# Patient Record
Sex: Female | Born: 1955 | Race: White | Hispanic: No | Smoking: Never smoker
Health system: Southern US, Community
[De-identification: ages and names within clinical notes are randomized; demographics above are authoritative.]

## PROBLEM LIST (undated history)

## (undated) DIAGNOSIS — M1990 Unspecified osteoarthritis, unspecified site: Secondary | ICD-10-CM

## (undated) DIAGNOSIS — M4800 Spinal stenosis, site unspecified: Secondary | ICD-10-CM

## (undated) DIAGNOSIS — I10 Essential (primary) hypertension: Secondary | ICD-10-CM

## (undated) DIAGNOSIS — R253 Fasciculation: Secondary | ICD-10-CM

## (undated) DIAGNOSIS — H9192 Unspecified hearing loss, left ear: Secondary | ICD-10-CM

## (undated) DIAGNOSIS — R252 Cramp and spasm: Secondary | ICD-10-CM

## (undated) DIAGNOSIS — E785 Hyperlipidemia, unspecified: Secondary | ICD-10-CM

## (undated) DIAGNOSIS — E042 Nontoxic multinodular goiter: Secondary | ICD-10-CM

## (undated) DIAGNOSIS — Z8679 Personal history of other diseases of the circulatory system: Secondary | ICD-10-CM

## (undated) DIAGNOSIS — I639 Cerebral infarction, unspecified: Secondary | ICD-10-CM

## (undated) DIAGNOSIS — K219 Gastro-esophageal reflux disease without esophagitis: Secondary | ICD-10-CM

## (undated) DIAGNOSIS — M791 Myalgia, unspecified site: Secondary | ICD-10-CM

## (undated) DIAGNOSIS — K859 Acute pancreatitis without necrosis or infection, unspecified: Secondary | ICD-10-CM

## (undated) HISTORY — DX: Gastro-esophageal reflux disease without esophagitis: K21.9

## (undated) HISTORY — DX: Hyperlipidemia, unspecified: E78.5

## (undated) HISTORY — DX: Acute pancreatitis without necrosis or infection, unspecified: K85.90

## (undated) HISTORY — DX: Unspecified hearing loss, left ear: H91.92

## (undated) HISTORY — DX: Cerebral infarction, unspecified: I63.9

## (undated) HISTORY — DX: Cramp and spasm: R25.2

## (undated) HISTORY — PX: EXTERNAL EAR SURGERY: SHX627

## (undated) HISTORY — DX: Fasciculation: R25.3

## (undated) HISTORY — DX: Unspecified osteoarthritis, unspecified site: M19.90

## (undated) HISTORY — DX: Spinal stenosis, site unspecified: M48.00

## (undated) HISTORY — DX: Nontoxic multinodular goiter: E04.2

## (undated) HISTORY — PX: TUBAL LIGATION: SHX77

## (undated) HISTORY — DX: Essential (primary) hypertension: I10

## (undated) HISTORY — PX: FRACTURE SURGERY: SHX138

## (undated) HISTORY — DX: Personal history of other diseases of the circulatory system: Z86.79

## (undated) HISTORY — DX: Myalgia, unspecified site: M79.10

---

## 1978-07-20 HISTORY — PX: THYROIDECTOMY, PARTIAL: SHX18

## 1993-07-20 HISTORY — PX: ABDOMINAL HYSTERECTOMY: SHX81

## 1995-07-21 HISTORY — PX: TONSILLECTOMY: SUR1361

## 1998-07-14 ENCOUNTER — Emergency Department (HOSPITAL_COMMUNITY): Admission: EM | Admit: 1998-07-14 | Discharge: 1998-07-14 | Payer: Self-pay | Admitting: Emergency Medicine

## 1998-10-29 ENCOUNTER — Encounter: Payer: Self-pay | Admitting: Endocrinology

## 1998-10-29 ENCOUNTER — Ambulatory Visit (HOSPITAL_COMMUNITY): Admission: RE | Admit: 1998-10-29 | Discharge: 1998-10-29 | Payer: Self-pay | Admitting: Endocrinology

## 1999-04-23 ENCOUNTER — Other Ambulatory Visit: Admission: RE | Admit: 1999-04-23 | Discharge: 1999-04-23 | Payer: Self-pay | Admitting: Endocrinology

## 2001-03-11 ENCOUNTER — Encounter: Admission: RE | Admit: 2001-03-11 | Discharge: 2001-03-11 | Payer: Self-pay | Admitting: Orthopedic Surgery

## 2001-03-11 ENCOUNTER — Encounter: Payer: Self-pay | Admitting: Orthopedic Surgery

## 2001-09-20 ENCOUNTER — Encounter: Payer: Self-pay | Admitting: Internal Medicine

## 2001-09-20 ENCOUNTER — Ambulatory Visit (HOSPITAL_COMMUNITY): Admission: RE | Admit: 2001-09-20 | Discharge: 2001-09-20 | Payer: Self-pay | Admitting: Internal Medicine

## 2001-11-20 ENCOUNTER — Observation Stay (HOSPITAL_COMMUNITY): Admission: EM | Admit: 2001-11-20 | Discharge: 2001-11-21 | Payer: Self-pay | Admitting: *Deleted

## 2001-11-20 ENCOUNTER — Encounter: Payer: Self-pay | Admitting: *Deleted

## 2001-12-05 ENCOUNTER — Emergency Department (HOSPITAL_COMMUNITY): Admission: EM | Admit: 2001-12-05 | Discharge: 2001-12-05 | Payer: Self-pay | Admitting: Emergency Medicine

## 2002-03-03 ENCOUNTER — Other Ambulatory Visit: Admission: RE | Admit: 2002-03-03 | Discharge: 2002-03-03 | Payer: Self-pay | Admitting: Gynecology

## 2002-03-22 ENCOUNTER — Encounter: Payer: Self-pay | Admitting: Gynecology

## 2002-03-29 ENCOUNTER — Observation Stay (HOSPITAL_COMMUNITY): Admission: RE | Admit: 2002-03-29 | Discharge: 2002-03-30 | Payer: Self-pay | Admitting: Gynecology

## 2002-03-29 ENCOUNTER — Encounter (INDEPENDENT_AMBULATORY_CARE_PROVIDER_SITE_OTHER): Payer: Self-pay | Admitting: *Deleted

## 2002-10-05 ENCOUNTER — Encounter: Payer: Self-pay | Admitting: Emergency Medicine

## 2002-10-05 ENCOUNTER — Emergency Department (HOSPITAL_COMMUNITY): Admission: EM | Admit: 2002-10-05 | Discharge: 2002-10-05 | Payer: Self-pay | Admitting: Emergency Medicine

## 2002-10-07 ENCOUNTER — Ambulatory Visit (HOSPITAL_COMMUNITY): Admission: EM | Admit: 2002-10-07 | Discharge: 2002-10-08 | Payer: Self-pay | Admitting: Specialist

## 2003-04-10 ENCOUNTER — Ambulatory Visit (HOSPITAL_BASED_OUTPATIENT_CLINIC_OR_DEPARTMENT_OTHER): Admission: RE | Admit: 2003-04-10 | Discharge: 2003-04-10 | Payer: Self-pay | Admitting: Orthopedic Surgery

## 2003-07-21 HISTORY — PX: ANKLE SURGERY: SHX546

## 2004-01-11 ENCOUNTER — Encounter: Admission: RE | Admit: 2004-01-11 | Discharge: 2004-01-11 | Payer: Self-pay | Admitting: Orthopedic Surgery

## 2004-06-18 ENCOUNTER — Other Ambulatory Visit: Admission: RE | Admit: 2004-06-18 | Discharge: 2004-06-18 | Payer: Self-pay | Admitting: Gynecology

## 2004-09-11 ENCOUNTER — Ambulatory Visit: Payer: Self-pay | Admitting: Internal Medicine

## 2004-12-03 ENCOUNTER — Ambulatory Visit: Payer: Self-pay | Admitting: Endocrinology

## 2004-12-29 ENCOUNTER — Ambulatory Visit: Payer: Self-pay | Admitting: Endocrinology

## 2004-12-31 ENCOUNTER — Ambulatory Visit: Payer: Self-pay | Admitting: Endocrinology

## 2005-01-13 ENCOUNTER — Encounter: Admission: RE | Admit: 2005-01-13 | Discharge: 2005-01-13 | Payer: Self-pay | Admitting: Orthopedic Surgery

## 2005-01-16 ENCOUNTER — Ambulatory Visit: Payer: Self-pay | Admitting: Endocrinology

## 2005-09-18 ENCOUNTER — Ambulatory Visit: Payer: Self-pay | Admitting: Endocrinology

## 2005-09-22 ENCOUNTER — Other Ambulatory Visit: Admission: RE | Admit: 2005-09-22 | Discharge: 2005-09-22 | Payer: Self-pay | Admitting: Gynecology

## 2005-09-23 ENCOUNTER — Encounter: Admission: RE | Admit: 2005-09-23 | Discharge: 2005-09-23 | Payer: Self-pay | Admitting: Gynecology

## 2005-11-13 ENCOUNTER — Ambulatory Visit: Payer: Self-pay | Admitting: Endocrinology

## 2005-11-25 ENCOUNTER — Ambulatory Visit: Payer: Self-pay | Admitting: Endocrinology

## 2006-01-27 ENCOUNTER — Ambulatory Visit: Payer: Self-pay | Admitting: Endocrinology

## 2006-03-15 ENCOUNTER — Ambulatory Visit: Payer: Self-pay | Admitting: Endocrinology

## 2006-04-09 ENCOUNTER — Ambulatory Visit: Payer: Self-pay | Admitting: Endocrinology

## 2006-04-16 ENCOUNTER — Ambulatory Visit: Payer: Self-pay | Admitting: Endocrinology

## 2006-07-16 ENCOUNTER — Ambulatory Visit: Payer: Self-pay | Admitting: Endocrinology

## 2006-09-06 ENCOUNTER — Ambulatory Visit: Payer: Self-pay | Admitting: Internal Medicine

## 2006-09-20 ENCOUNTER — Ambulatory Visit: Payer: Self-pay | Admitting: Endocrinology

## 2006-10-21 ENCOUNTER — Other Ambulatory Visit: Admission: RE | Admit: 2006-10-21 | Discharge: 2006-10-21 | Payer: Self-pay | Admitting: Gynecology

## 2007-05-17 ENCOUNTER — Encounter: Payer: Self-pay | Admitting: Endocrinology

## 2007-05-17 DIAGNOSIS — E785 Hyperlipidemia, unspecified: Secondary | ICD-10-CM

## 2007-05-17 DIAGNOSIS — I10 Essential (primary) hypertension: Secondary | ICD-10-CM | POA: Insufficient documentation

## 2007-05-17 DIAGNOSIS — M1990 Unspecified osteoarthritis, unspecified site: Secondary | ICD-10-CM

## 2007-05-17 DIAGNOSIS — Z8679 Personal history of other diseases of the circulatory system: Secondary | ICD-10-CM | POA: Insufficient documentation

## 2007-05-17 DIAGNOSIS — M4800 Spinal stenosis, site unspecified: Secondary | ICD-10-CM | POA: Insufficient documentation

## 2007-05-17 HISTORY — DX: Essential (primary) hypertension: I10

## 2007-05-17 HISTORY — DX: Hyperlipidemia, unspecified: E78.5

## 2007-05-17 HISTORY — DX: Unspecified osteoarthritis, unspecified site: M19.90

## 2007-05-17 HISTORY — DX: Personal history of other diseases of the circulatory system: Z86.79

## 2007-05-17 HISTORY — DX: Spinal stenosis, site unspecified: M48.00

## 2007-05-18 ENCOUNTER — Encounter: Payer: Self-pay | Admitting: Endocrinology

## 2007-07-25 ENCOUNTER — Ambulatory Visit: Payer: Self-pay | Admitting: Endocrinology

## 2007-07-25 DIAGNOSIS — R7989 Other specified abnormal findings of blood chemistry: Secondary | ICD-10-CM | POA: Insufficient documentation

## 2007-07-25 LAB — CONVERTED CEMR LAB
AST: 15 units/L (ref 0–37)
Albumin: 4 g/dL (ref 3.5–5.2)
Alkaline Phosphatase: 48 units/L (ref 39–117)
Basophils Absolute: 0 10*3/uL (ref 0.0–0.1)
Bilirubin Urine: NEGATIVE
Bilirubin, Direct: 0.2 mg/dL (ref 0.0–0.3)
CO2: 31 meq/L (ref 19–32)
Calcium: 9.7 mg/dL (ref 8.4–10.5)
Chloride: 106 meq/L (ref 96–112)
Creatinine, Ser: 0.8 mg/dL (ref 0.4–1.2)
Direct LDL: 177.9 mg/dL
Eosinophils Absolute: 0.1 10*3/uL (ref 0.0–0.6)
GFR calc Af Amer: 96 mL/min
Glucose, Bld: 99 mg/dL (ref 70–99)
Hemoglobin, Urine: NEGATIVE
Ketones, ur: NEGATIVE mg/dL
Lymphocytes Relative: 18.3 % (ref 12.0–46.0)
MCHC: 35.1 g/dL (ref 30.0–36.0)
MCV: 86.4 fL (ref 78.0–100.0)
Monocytes Absolute: 0.4 10*3/uL (ref 0.2–0.7)
Nitrite: NEGATIVE
Potassium: 4.8 meq/L (ref 3.5–5.1)
Total Bilirubin: 0.6 mg/dL (ref 0.3–1.2)
Total Protein, Urine: NEGATIVE mg/dL
Triglycerides: 139 mg/dL (ref 0–149)
Urine Glucose: NEGATIVE mg/dL

## 2007-07-28 ENCOUNTER — Ambulatory Visit: Payer: Self-pay | Admitting: Endocrinology

## 2007-08-11 ENCOUNTER — Ambulatory Visit: Payer: Self-pay | Admitting: Internal Medicine

## 2007-08-11 DIAGNOSIS — H6690 Otitis media, unspecified, unspecified ear: Secondary | ICD-10-CM | POA: Insufficient documentation

## 2007-08-15 ENCOUNTER — Telehealth (INDEPENDENT_AMBULATORY_CARE_PROVIDER_SITE_OTHER): Payer: Self-pay | Admitting: *Deleted

## 2007-08-15 DIAGNOSIS — H9209 Otalgia, unspecified ear: Secondary | ICD-10-CM | POA: Insufficient documentation

## 2007-08-16 ENCOUNTER — Telehealth: Payer: Self-pay | Admitting: Internal Medicine

## 2007-08-16 DIAGNOSIS — R519 Headache, unspecified: Secondary | ICD-10-CM | POA: Insufficient documentation

## 2007-08-16 DIAGNOSIS — R51 Headache: Secondary | ICD-10-CM | POA: Insufficient documentation

## 2007-08-18 ENCOUNTER — Encounter (INDEPENDENT_AMBULATORY_CARE_PROVIDER_SITE_OTHER): Payer: Self-pay | Admitting: *Deleted

## 2007-09-05 ENCOUNTER — Encounter: Admission: RE | Admit: 2007-09-05 | Discharge: 2007-09-05 | Payer: Self-pay | Admitting: Neurology

## 2007-10-12 ENCOUNTER — Encounter: Payer: Self-pay | Admitting: Internal Medicine

## 2008-02-01 ENCOUNTER — Encounter: Admission: RE | Admit: 2008-02-01 | Discharge: 2008-02-01 | Payer: Self-pay | Admitting: Gynecology

## 2008-02-09 ENCOUNTER — Encounter: Admission: RE | Admit: 2008-02-09 | Discharge: 2008-02-09 | Payer: Self-pay | Admitting: Gynecology

## 2008-02-24 ENCOUNTER — Ambulatory Visit: Payer: Self-pay | Admitting: Endocrinology

## 2008-05-22 ENCOUNTER — Ambulatory Visit: Payer: Self-pay | Admitting: Obstetrics and Gynecology

## 2008-05-22 DIAGNOSIS — M5382 Other specified dorsopathies, cervical region: Secondary | ICD-10-CM | POA: Insufficient documentation

## 2008-05-28 ENCOUNTER — Ambulatory Visit: Payer: Self-pay | Admitting: Cardiovascular Disease

## 2008-05-28 DIAGNOSIS — E042 Nontoxic multinodular goiter: Secondary | ICD-10-CM | POA: Insufficient documentation

## 2008-05-28 HISTORY — DX: Nontoxic multinodular goiter: E04.2

## 2008-06-08 ENCOUNTER — Encounter: Admission: RE | Admit: 2008-06-08 | Discharge: 2008-06-08 | Payer: Self-pay | Admitting: Endocrinology

## 2008-06-18 ENCOUNTER — Telehealth: Payer: Self-pay | Admitting: Endocrinology

## 2008-06-18 DIAGNOSIS — R1319 Other dysphagia: Secondary | ICD-10-CM | POA: Insufficient documentation

## 2008-06-27 ENCOUNTER — Encounter: Admission: RE | Admit: 2008-06-27 | Discharge: 2008-06-27 | Payer: Self-pay | Admitting: Endocrinology

## 2008-06-27 ENCOUNTER — Other Ambulatory Visit: Admission: RE | Admit: 2008-06-27 | Discharge: 2008-06-27 | Payer: Self-pay | Admitting: Diagnostic Radiology

## 2008-06-27 ENCOUNTER — Encounter (INDEPENDENT_AMBULATORY_CARE_PROVIDER_SITE_OTHER): Payer: Self-pay | Admitting: Diagnostic Radiology

## 2008-06-27 ENCOUNTER — Encounter: Payer: Self-pay | Admitting: Endocrinology

## 2008-07-23 ENCOUNTER — Encounter: Payer: Self-pay | Admitting: Internal Medicine

## 2008-07-27 ENCOUNTER — Telehealth (INDEPENDENT_AMBULATORY_CARE_PROVIDER_SITE_OTHER): Payer: Self-pay | Admitting: *Deleted

## 2008-07-30 ENCOUNTER — Ambulatory Visit: Payer: Self-pay | Admitting: Endocrinology

## 2008-08-09 ENCOUNTER — Telehealth: Payer: Self-pay | Admitting: Gastroenterology

## 2008-08-09 ENCOUNTER — Ambulatory Visit: Payer: Self-pay | Admitting: Gastroenterology

## 2008-08-09 DIAGNOSIS — K219 Gastro-esophageal reflux disease without esophagitis: Secondary | ICD-10-CM | POA: Insufficient documentation

## 2008-08-09 HISTORY — DX: Gastro-esophageal reflux disease without esophagitis: K21.9

## 2008-08-20 ENCOUNTER — Encounter: Payer: Self-pay | Admitting: Gastroenterology

## 2008-08-20 ENCOUNTER — Ambulatory Visit: Payer: Self-pay | Admitting: Gastroenterology

## 2008-08-21 ENCOUNTER — Encounter: Payer: Self-pay | Admitting: Gastroenterology

## 2008-08-22 ENCOUNTER — Telehealth: Payer: Self-pay | Admitting: Gastroenterology

## 2008-08-31 ENCOUNTER — Telehealth: Payer: Self-pay | Admitting: Gastroenterology

## 2008-09-13 ENCOUNTER — Telehealth: Payer: Self-pay | Admitting: Gastroenterology

## 2008-09-13 ENCOUNTER — Encounter: Payer: Self-pay | Admitting: Gastroenterology

## 2008-09-14 ENCOUNTER — Telehealth: Payer: Self-pay | Admitting: Gastroenterology

## 2009-03-18 ENCOUNTER — Encounter: Admission: RE | Admit: 2009-03-18 | Discharge: 2009-03-18 | Payer: Self-pay | Admitting: Orthopaedic Surgery

## 2009-03-21 ENCOUNTER — Encounter: Admission: RE | Admit: 2009-03-21 | Discharge: 2009-03-21 | Payer: Self-pay | Admitting: Orthopaedic Surgery

## 2009-06-05 ENCOUNTER — Encounter: Admission: RE | Admit: 2009-06-05 | Discharge: 2009-06-05 | Payer: Self-pay | Admitting: Gynecology

## 2009-06-26 ENCOUNTER — Ambulatory Visit: Payer: Self-pay | Admitting: Endocrinology

## 2009-06-26 LAB — CONVERTED CEMR LAB
ALT: 13 units/L (ref 0–35)
Albumin: 4 g/dL (ref 3.5–5.2)
Alkaline Phosphatase: 47 units/L (ref 39–117)
BUN: 15 mg/dL (ref 6–23)
Basophils Absolute: 0 10*3/uL (ref 0.0–0.1)
Basophils Relative: 0.6 % (ref 0.0–3.0)
Bilirubin Urine: NEGATIVE
CO2: 28 meq/L (ref 19–32)
Creatinine, Ser: 0.9 mg/dL (ref 0.4–1.2)
GFR calc non Af Amer: 68.88 mL/min (ref >60)
HCT: 42.8 % (ref 36.0–46.0)
HDL: 64.2 mg/dL (ref >39.00)
Hemoglobin, Urine: NEGATIVE
Ketones, ur: NEGATIVE mg/dL
Leukocytes, UA: NEGATIVE
Lymphocytes Relative: 29.8 % (ref 12.0–46.0)
Lymphs Abs: 1.5 10*3/uL (ref 0.7–4.0)
MCV: 89 fL (ref 78.0–100.0)
Monocytes Absolute: 0.4 10*3/uL (ref 0.1–1.0)
Neutro Abs: 2.9 10*3/uL (ref 1.4–7.7)
Neutrophils Relative %: 59.5 % (ref 43.0–77.0)
Potassium: 3.8 meq/L (ref 3.5–5.1)
RBC: 4.81 M/uL (ref 3.87–5.11)
Total Bilirubin: 0.7 mg/dL (ref 0.3–1.2)
Total Protein, Urine: NEGATIVE mg/dL
Triglycerides: 99 mg/dL (ref 0.0–149.0)
Urine Glucose: NEGATIVE mg/dL
Urobilinogen, UA: 0.2 (ref 0.0–1.0)
pH: 7.5 (ref 5.0–8.0)

## 2009-07-10 ENCOUNTER — Ambulatory Visit: Payer: Self-pay | Admitting: Endocrinology

## 2010-04-05 IMAGING — US US SOFT TISSUE HEAD/NECK
1 series · 14 of 14 positions shown · non-contrast
Comparison: CT of head dated 05/28/2008

CLINICAL DATA: Multinodular thyroid goiter.  Previous right
thyroidectomy.  Nodule detected in the region of the thyroid by
recent CT of the neck.

THYROID ULTRASOUND
TECHNIQUE: Ultrasound examination of the thyroid gland and
adjacent soft tissues was performed.

[Series 1: us soft tissue head/neck · 0.07mm/px · 14 of 14 slices shown]
[im 1/14]
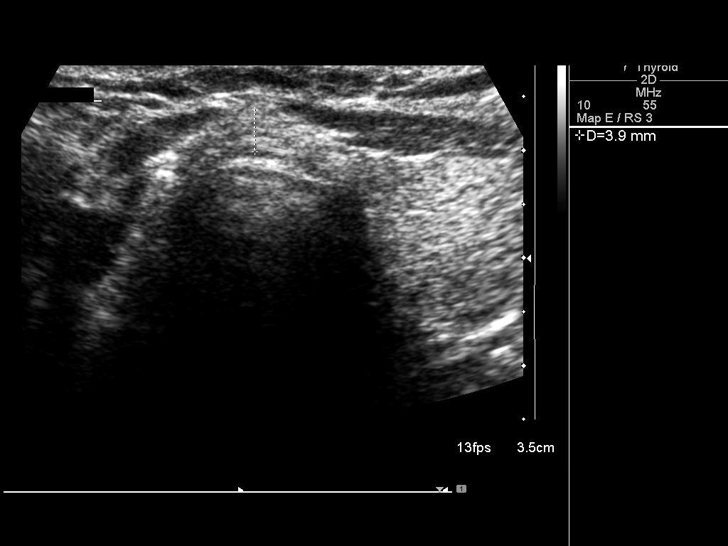
[im 2/14]
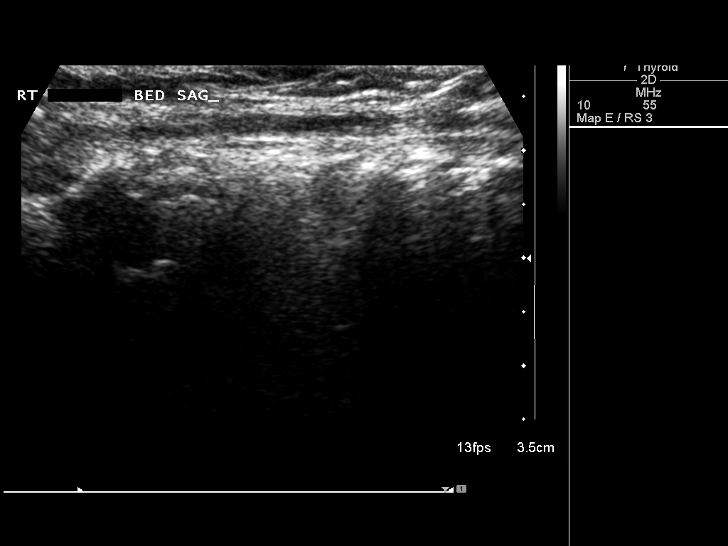
[im 3/14]
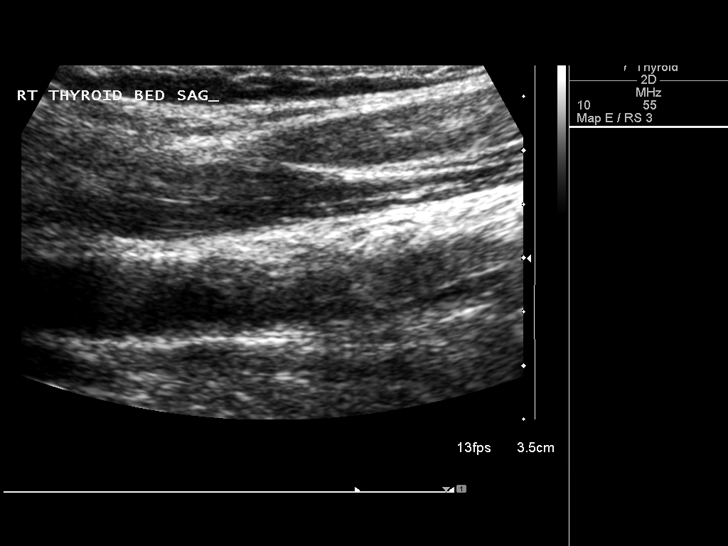
[im 4/14]
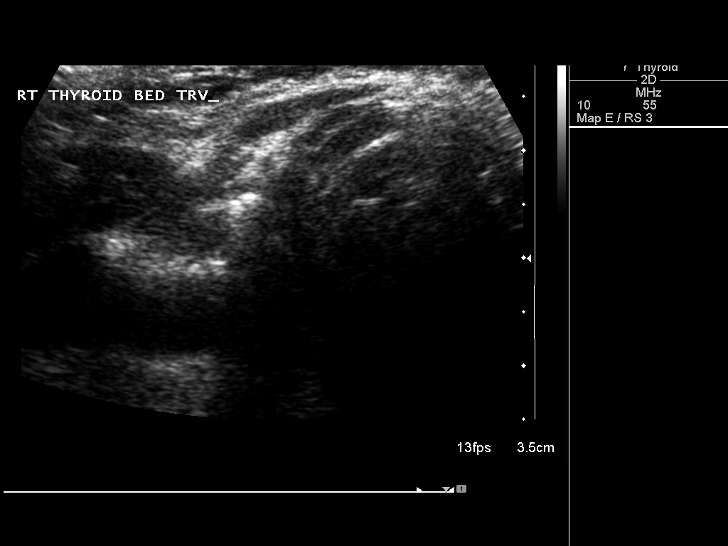
[im 5/14]
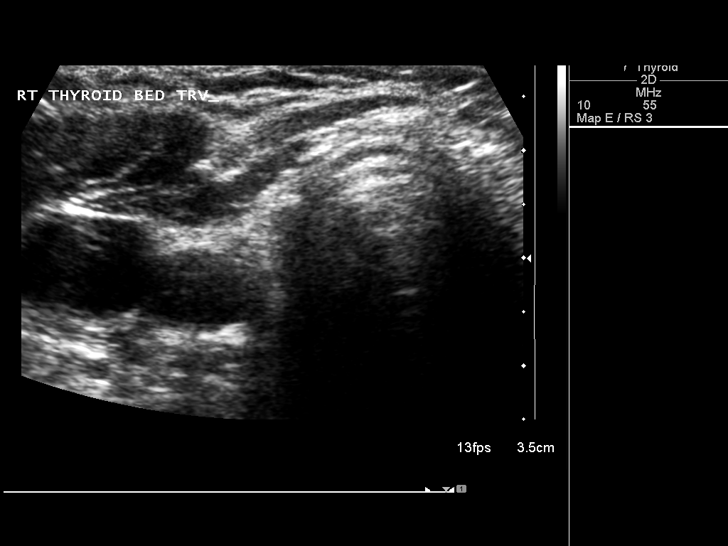
[im 6/14]
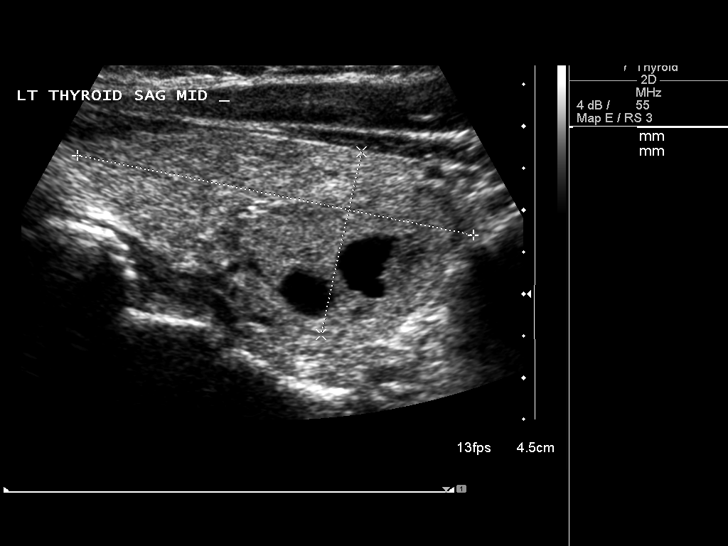
[im 7/14]
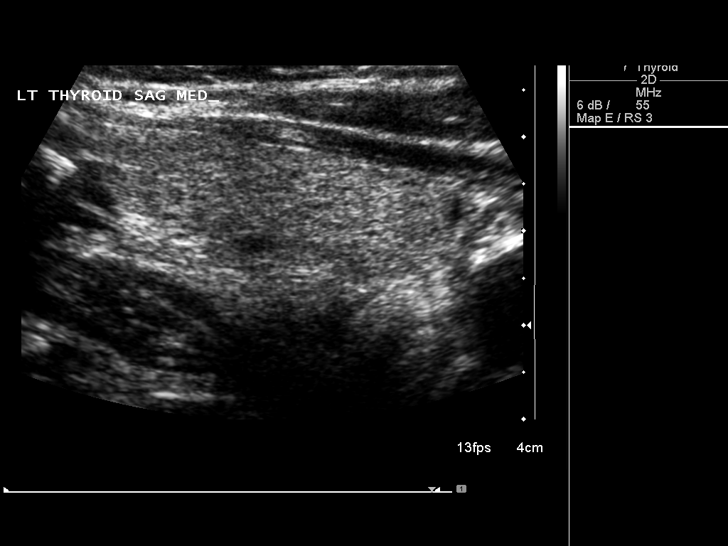
[im 8/14]
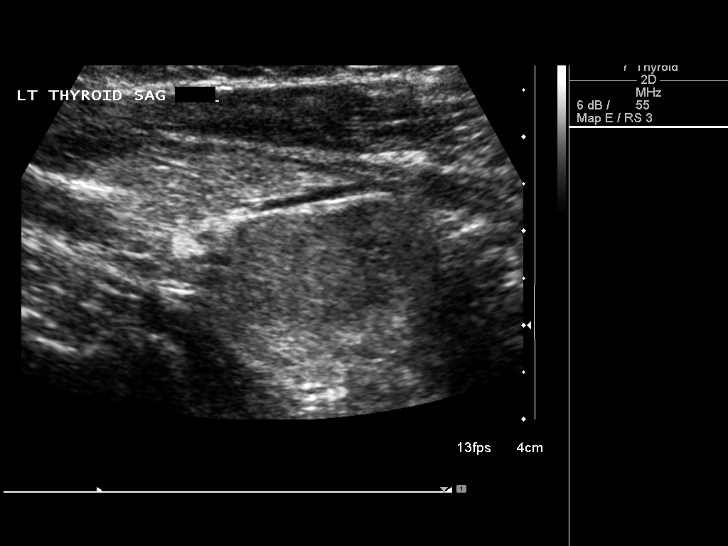
[im 9/14]
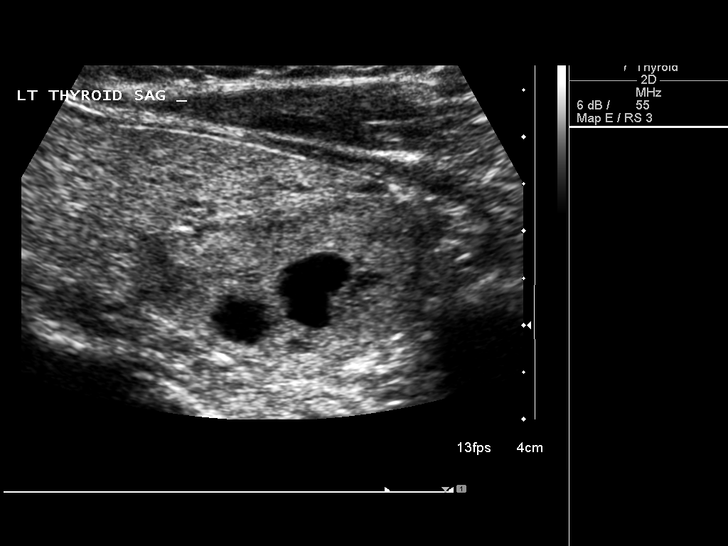
[im 10/14]
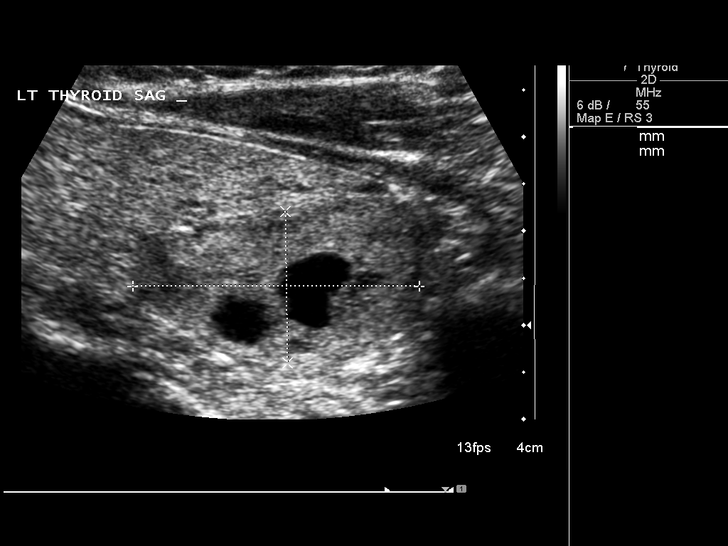
[im 11/14]
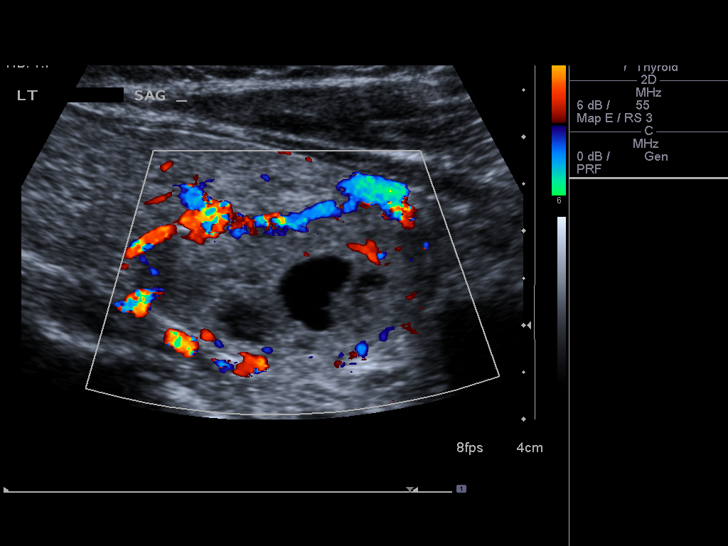
[im 12/14]
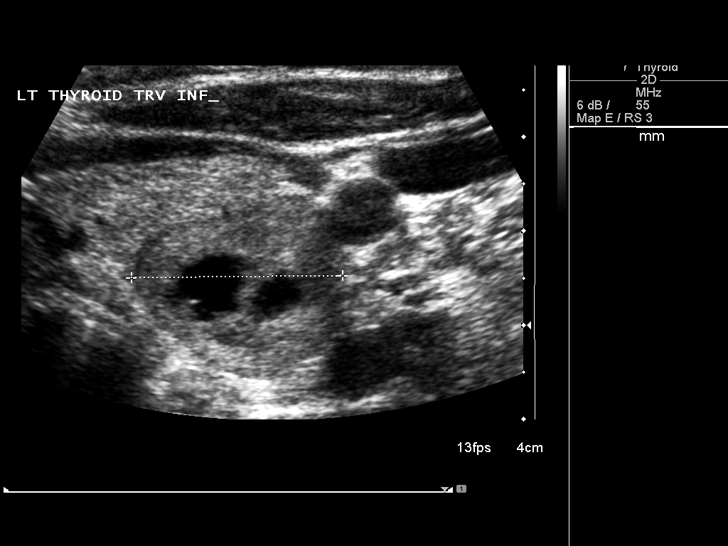
[im 13/14]
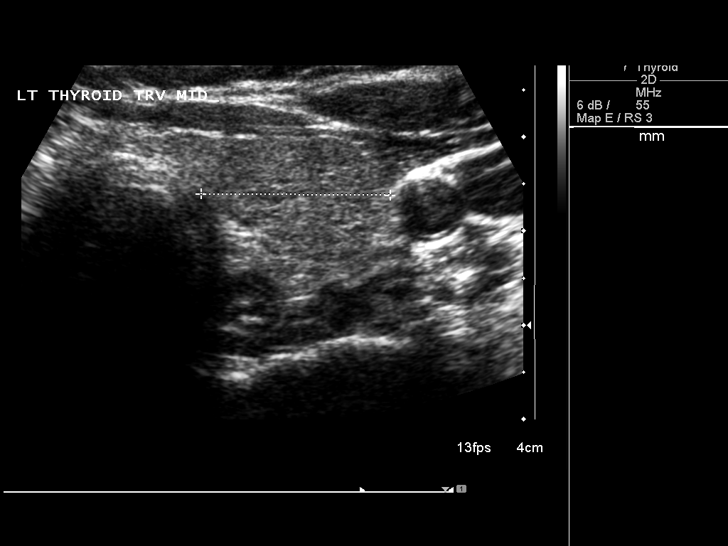
[im 14/14]
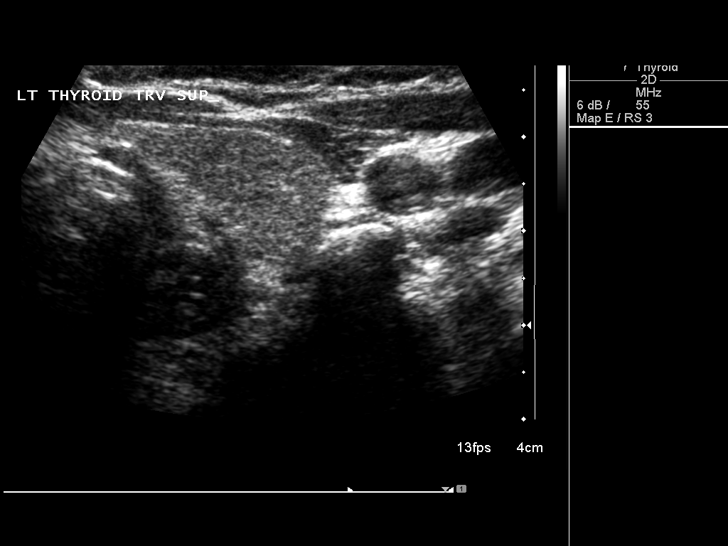

[14 of 14 positions shown; findings below may reference images not displayed]

FINDINGS: Right lobe:  Surgically removed

Left lobe:  4.8 x 2.2 x 2.0 cm.

Thyroid isthmus:  0.4 cm.

Solid and cystic nodule is present in the inferior left lobe
measuring 3.0 x 1.6 x 2.2 cm.  This has peripheral solid tissue
with discrete areas of central cystic degeneration.  No associated
microcalcifications.
IMPRESSION: Thyroid nodule of the inferior left lobe measuring 3 cm in greatest
diameter.  This has both solid and central cystic components.  This
is likely a degenerating nodule.  Options for further workup
included immediate percutaneous biopsy based on size or interval
follow-up ultrasound to determine whether there is any nodule
involution given the central cystic change evident by ultrasound
today.

## 2010-08-04 ENCOUNTER — Telehealth: Payer: Self-pay | Admitting: Endocrinology

## 2010-08-11 ENCOUNTER — Ambulatory Visit
Admission: RE | Admit: 2010-08-11 | Discharge: 2010-08-11 | Payer: Self-pay | Source: Home / Self Care | Attending: Endocrinology | Admitting: Endocrinology

## 2010-08-11 ENCOUNTER — Encounter: Payer: Self-pay | Admitting: Gynecology

## 2010-08-11 ENCOUNTER — Other Ambulatory Visit: Payer: Self-pay | Admitting: Endocrinology

## 2010-08-11 LAB — URINALYSIS
Bilirubin Urine: NEGATIVE
Hemoglobin, Urine: NEGATIVE
Leukocytes, UA: NEGATIVE
Total Protein, Urine: NEGATIVE
Urine Glucose: NEGATIVE
Urobilinogen, UA: 1 (ref 0.0–1.0)

## 2010-08-11 LAB — CBC WITH DIFFERENTIAL/PLATELET
Basophils Absolute: 0 10*3/uL (ref 0.0–0.1)
Basophils Relative: 0.5 % (ref 0.0–3.0)
Lymphocytes Relative: 28.1 % (ref 12.0–46.0)
Monocytes Absolute: 0.4 10*3/uL (ref 0.1–1.0)
Monocytes Relative: 8.1 % (ref 3.0–12.0)
Neutro Abs: 3.3 10*3/uL (ref 1.4–7.7)
Neutrophils Relative %: 60.2 % (ref 43.0–77.0)
Platelets: 275 10*3/uL (ref 150.0–400.0)
RDW: 13.8 % (ref 11.5–14.6)

## 2010-08-11 LAB — BASIC METABOLIC PANEL
BUN: 19 mg/dL (ref 6–23)
CO2: 28 mEq/L (ref 19–32)
Calcium: 9.3 mg/dL (ref 8.4–10.5)
GFR: 83.38 mL/min (ref >60.00)
Glucose, Bld: 91 mg/dL (ref 70–99)

## 2010-08-11 LAB — HEPATIC FUNCTION PANEL
ALT: 15 U/L (ref 0–35)
AST: 16 U/L (ref 0–37)
Alkaline Phosphatase: 52 U/L (ref 39–117)

## 2010-08-11 LAB — LDL CHOLESTEROL, DIRECT: Direct LDL: 186.4 mg/dL

## 2010-08-13 ENCOUNTER — Encounter: Payer: Self-pay | Admitting: Endocrinology

## 2010-08-13 ENCOUNTER — Ambulatory Visit
Admission: RE | Admit: 2010-08-13 | Discharge: 2010-08-13 | Payer: Self-pay | Source: Home / Self Care | Attending: Endocrinology | Admitting: Endocrinology

## 2010-08-17 LAB — CONVERTED CEMR LAB

## 2010-08-21 NOTE — Progress Notes (Signed)
Summary: Rx refill req  Phone Note Refill Request Message from:  Patient on August 04, 2010 4:26 PM  Refills Requested: Medication #1:  MAXZIDE-25 37.5-25 MG  TABS 1 qd   Dosage confirmed as above?Dosage Confirmed   Supply Requested: 1 month Pt will schedule appt within next 30 days   Method Requested: Electronic Initial call taken by: Margaret Pyle, CMA,  August 04, 2010 4:27 PM    Prescriptions: MAXZIDE-25 37.5-25 MG  TABS (TRIAMTERENE-HCTZ) 1 qd  #30 x 0   Entered by:   Margaret Pyle, CMA   Authorized by:   Minus Breeding MD   Signed by:   Margaret Pyle, CMA on 08/04/2010   Method used:   Electronically to        [facility] [address] [phone]* (retail)       [address]       [place], [place]  [address]       Ph: [phone] or [phone]       Fax: [phone]   RxID:   [id number]

## 2010-08-27 NOTE — Assessment & Plan Note (Signed)
Summary: yearly f/u / bcbs/cd   Vital Signs:  Patient profile:   55 year old female Height:      66 inches (167.64 cm) Weight:      237.50 pounds (107.95 kg) BMI:     38.47 O2 Sat:      97 % on Room air Temp:     99.6 degrees F (37.56 degrees C) oral Pulse rate:   81 / minute Pulse rhythm:   regular BP sitting:   128 / 80  (left arm) Cuff size:   large  Vitals Entered By: Brenton Grills CMA Duncan Dull) (August 13, 2010 10:26 AM)  O2 Flow:  Room air CC: [facility]/aj Is Patient Diabetic? No Comments pt is due for mammogram and pap with Dr. Nicholas Lose   Primary Provider:  Lorna Few  CC:  [facility]/aj.  History of Present Illness: here for regular wellness examination.  He's feeling pretty well in general, except for dry mouth   Current Medications (verified): 1)  Norvasc 10 Mg  Tabs (Amlodipine Besylate) .... Once Daily 2)  Ecotrin Low Strength 81 Mg  Tbec (Aspirin) .Marland Kitchen.. 1 By Mouth Qd 3)  Vivelle-Dot 0.05 Mg/24hr  Pttw (Estradiol) .... As Dir--Dr Nicholas Lose 4)  Maxzide-25 37.5-25 Mg  Tabs (Triamterene-Hctz) .Marland Kitchen.. 1 Tablet By Mouth Once Daily 5)  Omeprazole 40 Mg Cpdr (Omeprazole) .... Take 1 Capsule By Mouth Once Daily  Allergies (verified): 1)  ! Morphine 2)  ! * Vytroin 3)  ! * Crestor  Past History:  Past Medical History: Last updated: 07/17/2008 Hyperlipidemia Hypertension Osteoarthritis Transient ischemic attack, hx of mlld left hearing loss spinal stenosis prominent right clavicular head (pt says w/u showed this to be scar tissue due to 1980 thyroid surgery) Obesity  Family History: Reviewed history from 08/09/2008 and no changes required. Uncle had rectal cancer Family History High cholesterol Family History Hypertension mother abd father with CAD mother and 2 bro with DM her mother has Barrett's mucosa her father suffered from peptic ulcers disease.  Social History: Reviewed history from 08/09/2008 and no changes required. realtor married Never Smoked Alcohol  use-no Daily Caffeine Use  diet soda  Review of Systems  The patient denies fever, weight loss, weight gain, vision loss, decreased hearing, chest pain, syncope, dyspnea on exertion, prolonged cough, headaches, abdominal pain, melena, hematochezia, severe indigestion/heartburn, hematuria, suspicious skin lesions, and depression.    Physical Exam  General:  normal appearance.   Head:  head: no deformity eyes: no periorbital swelling, no proptosis external nose and ears are normal mouth: no lesion seen Neck:  a healed scar is present.  i do not appreciate a nodule in the thyroid or elsewhere in the neck  Breasts:  sees gyn  Lungs:  Clear to auscultation bilaterally. Normal respiratory effort.  Heart:  Regular rate and rhythm without murmurs or gallops noted. Normal S1,S2.   Abdomen:  abdomen is soft, nontender.  no hepatosplenomegaly.   not distended.  no hernia  Rectal:  sees gyn  Genitalia:  sees gyn Msk:  muscle bulk and strength are grossly normal.  no obvious joint swelling.  gait is normal and steady  Pulses:  dorsalis pedis intact bilat.  no carotid bruit. Extremities:  no deformity.  no ulcer on the feet.  feet are of normal color and temp.  no edema there is a healed surgical scar on the left foot, near the arch. Neurologic:  sensation is intact to touch on the feet. Skin:  normal texture and temp.  no rash.  not diaphoretic  Cervical Nodes:  No significant adenopathy.  Psych:  Alert and cooperative; normal mood and affect; normal attention span and concentration.     Impression & Recommendations:  Problem # 1:  ROUTINE GENERAL MEDICAL EXAM@HEALTH  CARE FACL (ICD-V70.0)  Problem # 2:  dry mouth ? due to diuretic  Medications Added to Medication List This Visit: 1)  Maxzide-25 37.5-25 Mg Tabs (Triamterene-hctz) .Marland Kitchen.. 1 tablet by mouth once daily 2)  Maxzide-25 37.5-25 Mg Tabs (Triamterene-hctz) .... 1/2 tablet by mouth once daily 3)  Omeprazole 40 Mg Cpdr  (Omeprazole) .... Take 1 capsule by mouth once daily  Other Orders: EKG w/ Interpretation (93000) Est. Patient 40-64 years ([address])  Patient Instructions: 1)  reduce triamterene-hctz to 1/2 pill per day. 2)  please consider these measures for your health:  minimize alcohol.  do not use tobacco products.  have a colonoscopy at least every 10 years from age 42.  keep firearms safely stored.  always use seat belts.  have working smoke alarms in your home.  see an eye doctor and dentist regularly.  never drive under the influence of alcohol or drugs (including prescription drugs).  those with fair skin should take precautions against the sun. 3)  please let me know what your wishes would be, if artificial life support measures should become necessary.  it is critically important to prevent falling down (keep floor areas well-lit, dry, and free of loose objects). 4)  (update: we discussed code status.  pt requests full code, but would not want to be started or maintained on artificial life-support measures if there was not a reasonable chance of recovery) Prescriptions: NORVASC 10 MG  TABS (AMLODIPINE BESYLATE) once daily  #90 x 3   Entered and Authorized by:   Minus Breeding MD   Signed by:   Minus Breeding MD on 08/13/2010   Method used:   Electronically to        [facility] [address] [phone]* (retail)       [address]       [place], [place]  [address]       Ph: [phone] or [phone]       Fax: [phone]   RxID:   [id number] MAXZIDE-25 37.5-25 MG  TABS (TRIAMTERENE-HCTZ) 1/2 tablet by mouth once daily  #30 x 5   Entered and Authorized by:   Minus Breeding MD   Signed by:   Minus Breeding MD on 08/13/2010   Method used:   Electronically to        [facility] [address] [phone]* (retail)       [address]       [place], [place]  [address]       Ph: [phone] or [phone]       Fax: [phone]   RxID:    [id number]    Orders Added: 1)  EKG w/ Interpretation [93[address]] 2)  Est. Patient 40-64 years [phone]

## 2010-10-03 ENCOUNTER — Telehealth: Payer: Self-pay | Admitting: Endocrinology

## 2010-10-07 NOTE — Progress Notes (Signed)
Summary: BP med dosage?  Phone Note Call from Patient   Caller: Patient [phone] Summary of Call: Pt called stating her BP medication was reduced at [facility] in January to 1/2 tab. Pt has since noticed and increase in fluid retention and BP so she has recently began taking 1 tab instead. Pt states her BP is now within normal ranges and she has less fluid retention. Pt is requesting new Rx for 1 tab once daily, please advise. Initial call taken by: Margaret Pyle, CMA,  October 03, 2010 8:27 AM  Follow-up for Phone Call        sent Follow-up by: Minus Breeding MD,  October 03, 2010 9:18 AM  Additional Follow-up for Phone Call Additional follow up Details #1::        Pt informed via VM Additional Follow-up by: Margaret Pyle, CMA,  October 03, 2010 9:28 AM    New/Updated Medications: MAXZIDE-25 37.5-25 MG  TABS (TRIAMTERENE-HCTZ) 1 tablet by mouth once daily Prescriptions: MAXZIDE-25 37.5-25 MG  TABS (TRIAMTERENE-HCTZ) 1 tablet by mouth once daily  #30 x 11   Entered and Authorized by:   Minus Breeding MD   Signed by:   Minus Breeding MD on 10/03/2010   Method used:   Electronically to        [facility] [address] [phone]* (retail)       [address]       [place], [place]  [address]       Ph: [phone] or [phone]       Fax: [phone]   RxID:   [id number]

## 2010-10-13 ENCOUNTER — Other Ambulatory Visit: Payer: Self-pay | Admitting: Gynecology

## 2010-10-13 DIAGNOSIS — Z1231 Encounter for screening mammogram for malignant neoplasm of breast: Secondary | ICD-10-CM

## 2010-10-16 ENCOUNTER — Ambulatory Visit: Payer: Self-pay

## 2010-10-22 ENCOUNTER — Encounter: Payer: Self-pay | Admitting: Internal Medicine

## 2010-10-22 ENCOUNTER — Ambulatory Visit (INDEPENDENT_AMBULATORY_CARE_PROVIDER_SITE_OTHER): Payer: BC Managed Care – PPO | Admitting: Internal Medicine

## 2010-10-22 VITALS — BP 132/76 | HR 80 | Temp 98.2°F | Ht 66.0 in

## 2010-10-22 DIAGNOSIS — H6690 Otitis media, unspecified, unspecified ear: Secondary | ICD-10-CM

## 2010-10-22 MED ORDER — LORATADINE-PSEUDOEPHEDRINE ER 5-120 MG PO TB12: 1.0000 | ORAL_TABLET | Freq: Two times a day (BID) | ORAL | Status: AC

## 2010-10-22 MED ORDER — FLUTICASONE PROPIONATE 50 MCG/ACT NA SUSP: 2.0000 | Freq: Every day | NASAL | Status: DC

## 2010-10-22 MED ORDER — AMOXICILLIN-POT CLAVULANATE 875-125 MG PO TABS: 1.0000 | ORAL_TABLET | Freq: Two times a day (BID) | ORAL | Status: AC

## 2010-10-22 NOTE — Progress Notes (Signed)
  Subjective:    Patient ID: Kelly Wilson, female    DOB: 11-09-1955, 55 y.o.   MRN: [id number]  HPI the patient complains of left ear "plugged" sensation Onset 10days ago eval at gyn last week - tx Zpak and drops - unimproved associated with decreased hearing, head congestion and "popping" in L ear but no fever, no ear pain or headache No drainage from ear +hx same requiring surg >63yr ago Past Medical History  Diagnosis Date  . Hyperlipidemia   . Hypertension   . 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 HYPERLIPIDEMIA 05/17/2007  . HYPERTENSION 05/17/2007  . GERD 08/09/2008  . OSTEOARTHRITIS 05/17/2007  . SPINAL STENOSIS 05/17/2007  . TRANSIENT ISCHEMIC ATTACK, HX OF 05/17/2007  . Left ear hearing loss     Mild    Review of Systems  Constitutional: Negative for fever and fatigue.  HENT: Negative for neck pain, neck stiffness and tinnitus.   Respiratory: Negative for cough and shortness of breath.   Cardiovascular: Negative for chest pain.      Objective:   Physical Exam  Constitutional: She is oriented to person, place, and time. She appears well-developed and well-nourished. No distress.  HENT:  Head: Normocephalic and atraumatic.  Right Ear: External ear normal.  Nose: Nose normal.  Mouth/Throat: Oropharynx is clear and moist.       L TM red with hazy effusion  Eyes: Conjunctivae and EOM are normal. Pupils are equal, round, and reactive to light. No scleral icterus.  Neck: Normal range of motion. Neck supple.  Cardiovascular: Normal rate, regular rhythm and normal heart sounds.  Exam reveals no friction rub.   No murmur heard. Pulmonary/Chest: Effort normal and breath sounds normal. No respiratory distress. She has no wheezes.  Lymphadenopathy:    She has no cervical adenopathy.  Neurological: She is alert and oriented to person, place, and time. No cranial nerve deficit. Coordination normal.   BP 132/76  Pulse 80  Temp(Src) 98.2 F (36.8 C) (Oral)  Ht 5\' 6"   (1.676 m)      Assessment & Plan:  See problem list. Medications and labs reviewed today.

## 2010-10-22 NOTE — Patient Instructions (Addendum)
It was good to see you today. Augmentin, Flonase and decongestant as discussed - Your prescription(s) have been submitted to your pharmacy. Please take as directed and contact our office if you believe you are having problem(s) with the medication(s). Alternate between ibuprofen and tylenol for aches or pain symptoms: Dose yourself every 4-6 hours using 400-600 mg ibuprofen alternating with 1000 mg acetaminophen as needed Call if symptoms unimproved with treatment or if symptoms worse

## 2010-10-22 NOTE — Assessment & Plan Note (Signed)
status post Zpak without improvement in symptoms  Hx complicated L ear infections >80yr ago requiring surg Treat now with 10d augmentin, flonase and decongestants - erx done To call if unimproved to consider ENT refer - pt understands and agrees

## 2010-12-05 NOTE — Op Note (Signed)
TNAMEKwynn, Kelly Wilson NO.:  [id number]   MEDICAL RECORD NO.:  [id number]                   PATIENT TYPE:   LOCATION:                                       FACILITY:   PHYSICIAN:  Gretta Cool, M.D.              DATE OF BIRTH:   DATE OF PROCEDURE:  03/29/2002  DATE OF DISCHARGE:                                 OPERATIVE REPORT   PREOPERATIVE DIAGNOSIS:  Severe and incapacitating refractory pelvic pain.   POSTOPERATIVE DIAGNOSIS:  1. Probable adenomyosis with a large boggy soft uterus, no evidence of     external endometriosis.  2. 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   SURGEON:  Gretta Cool, M.D.   ASSISTANT:  Raynald Kemp, M.D.   ANESTHESIA:  General.   PROCEDURE:  Diagnostic laparoscopy, laparoscopically assisted vaginal  hysterectomy, right salpingo-oophorectomy.   DESCRIPTION OF PROCEDURE:  Under excellent general anesthesia as above, oral  trachea with the patient prepped and draped in the lithotomy position in  Herman stirrups, a Hulka tenaculum was applied to the cervix and bladder  drained.  A Hasson incision was then made and Veress cameras introduced  after adequate pneumoperitoneum with carbon dioxide 2 L.  A laparoscope  trocar 11 mm was introduced.  The pelvic floor was then carefully re-  scrutinized with a straight laparoscope.  There was no evidence of  significant endometriosis.  Her right ovary was on a tenuous pedicle after  tubal sterilization procedure by Fallo band, possibly associated with  intermittent torsion.  The uterus was boggy and large, soft, highly  suspicious of adenomyosis, filled her pelvis from side wall to side wall,  making manipulation somewhat difficult.  At this point, decision was made to  proceed with laparoscopy assisted hysterectomy and right salpingo-  oophorectomy.  The accessory probes were placed, and the tripolar forceps  were used to cauterize the round ligament  and infundibulopelvic structures  on the right.  On the left, the ovarian ligament was cauterized and then  transected.  The cardinal ligament was also cauterized and transected.  The  dissection was taken to the level of the uterine vessels.  At this point,  the pedicles were dried.  The upper portion of the procedure was terminated,  and the patient repositioned for the vaginal portion of the procedure.  The  cervix was grasped with a single tooth tenaculum, pulled down to view.  It  was then progressively infiltrated with Xylocaine with epinephrine  1:200,000.  Mucosa was then incised and the mucosa pushed off the lower  uterine segment.  The cul-de-sac was then entered, and the cardinal and  uterosacral ligaments progressively clamped, cut, sutured, and tied with 0  Vicryl.  The vesicovaginal plica was then opened and a Deaver placed beneath  the bladder.  The uterine vessels were then clamped, cut, sutured, and tied  with 0 Vicryl.  At this point, a  communicating clamp using a mattressing  clamp was used to clamp the intervening tissue between the above and below  dissections.  The uterus was then removed, and the pedicles ligated.  A  cardinal uterosacral colposuspension was then performed using 0 Ethibond and  securing the ligaments to the vaginal cuff.  The peritoneum was then closed  by pursestring suture of 0 Monocryl.  The cuff was then approximated with a  running suture of 0 Vicryl from left-to-right.  At the end of the procedure,  the vaginal incision was dry and well-approximated by subcuticular closure.  Attention was then turned to the abdominal portion of the procedure.  The  abdomen was then reinflated with carbon dioxide and the pedicles examined.  They had reduced pressure.  There was no significant bleeding.  Pelvic floor  was irrigated  with lactated ringers, and the incisions were closed with a deep suture of 5-  0 Vicryl, skin closure with Steri-Strips.  At the end  of the procedure,  sponge and lap counts were correct.  There were no complications.  The  patient returned to the recovery room in excellent condition.                                                Gretta Cool, M.D.    CWL/MEDQ  D:  03/29/2002  T:  03/29/2002  Job:  [id number]   cc:   Gregary Signs A. Everardo All, M.D. [facility]

## 2010-12-05 NOTE — Discharge Summary (Signed)
[place]  Patient:    Kelly Wilson, Kelly Wilson Visit Number: [id number] MRN: [id number]          Service Type: MED Location: 3000 [id number] Attending Physician:  Fenton Malling Dictated by:   Candy Sledge, M.D. Admit Date:  11/20/2001 Discharge Date: 11/21/2001   CC:         Justine Null, M.D. [facility]   Discharge Summary  For complete details and chief complaint, history of present illness, past medical history and physical examination please refer to Dr. Kelli Hope admission history and physical.  Briefly, the patient is a 55 year old female with a past history including hypertension; who, on the morning of admission, noted a tingling sensation involving her left arm, face and foot.  This progressed over several minutes to numbness and there was associated weakness.  Symptoms had pretty much cleared upon her arrival at the emergency room.  She then had a recurrence of the same symptomatology and was noted to have definite left-sided weakness on examination by the emergency room physician. Upon her return from the CT scan; however, she again was asymptomatic.  Her only complaint at the time of Dr. Thad Ranger was the left temporal headache. The patients only medication on admission was Accupril 40 mg p.o. every day. Her exam was nonfocal per Dr. Thad Ranger.  She was admitted for observation with the diagnosis of right brain transient ischemic attacks, probably small vessel in nature.  She was started on intravenous heparin.  The patient also has a history of a stapedectomy in the past with placement of metal in her ear, prohibiting MRI scanning.  LABORATORY FINDINGS:  Admission CBC and differential showed white blood cell count 6600, hemoglobin 13.2 and hematocrit 38.4.  Differential showed 76% neutrophils, 16% lymphocytes, and 6% monocytes.  Comprehensive metabolic panel was entirely normal.  Prothrombin time was 12.4 with an INR  of 0.9.  CT scan of the head, on admission, per report was negative.  A 12-lead EKG showed normal sinus rhythm, cannot rule out anterior infarct -- age undetermined.  A 2-D echocardiogram was a limited study technically, but showed an ejection fraction of 55-65%.  No regional wall motion abnormalities.  No obvious source of emboli.  Carotid Doppler studies showed no obvious evidence of plaque in the internal carotid arteries.  The vertebral artery flow was antegrade bilaterally.  COURSE IN HOSPITAL: The patient was admitted to the neurosciences unit.  Vital signs remained stable throughout this brief hospitalization with maximum blood pressure 152/74.  She is afebrile.  The patient did complain of a left-sided headache on the day following admission which eased off temporarily with 2 Vicodin and was otherwise tolerable.  She had no recurrence of left-sided or other focal symptomatology.  The patient was maintained on heparin while undergoing the above noted studies.  After reviewing her results, it was felt that she had derived the maximum hospital benefit on the evening of 11/21/01 and was discharged to home.  DISCHARGE DIAGNOSES: 1. 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 2. History of hypertension. 3. History of migraine headaches.  DISCHARGE MEDICATIONS: 1. Enteric coated aspirin 325 mg p.o. every day. 2. Accupril 40 mg p.o. every day.  CONDITION ON DISCHARGE:  Stable.  PROGNOSIS:  Good.  DISCHARGE INSTRUCTIONS:  The patient was instructed to followup with Dr. Romero Belling as scheduled or as needed, and also to return to see Dr. Kelli Hope or Dr. Fransisca Connors, as needed, for  neuological followup. Dictated by:   Candy Sledge, M.D. Attending Physician:  Fenton Malling DD:  11/21/01 TD:  11/24/01 Job: [id number]

## 2010-12-05 NOTE — H&P (Signed)
[facility]  Patient:    Kelly Wilson, Kelly Wilson Visit Number: [id number] MRN: [id number]          Service Type: MED Location: 3000 [id number] Attending Physician:  Fenton Malling Dictated by:   Kelli Hope, M.D. Admit Date:  11/20/2001   CC:         Justine Null, M.D. [facility]   History and Physical  DATE OF BIRTH: [birth year]  REASON FOR ADMISSION: Left-sided numbness.  HISTORY OF PRESENT ILLNESS: This is a [place] stroke service admission for this 55 year old woman, with past medical history including hypertension, who was sitting in church this morning when she suddenly noticed a tingling sensation come over her left arm, face, and foot. This progressed over several minutes to a numbness sensation.  She may have had some weakness with this, although she is not certain.  EMS was alerted and brought her to the emergency room.  Her symptoms were much better upon arrival to the emergency room but then recurred.  At that point she was examined by the emergency room physician and was felt to have definite weakness on the left side.  She was taken to the CT scanner and upon return from CT was essentially asymptomatic.  She now feels well except that she has a throbbing headache in the left temporal area.  She denies any chest pain, shortness of breath, or palpitations associated with the event.  She was noted to be quite anxious when the event recurred in the emergency room.  PAST MEDICAL HISTORY:  1. Hypertension, as above, which she says is under good control.  2. History of migraine.  3. Has had stapedectomy in the past.  FAMILY HISTORY: Remarkable for coronary artery disease in both her parents. She also says that some members of her mothers family had some early strokes.  SOCIAL HISTORY: She denies tobacco or alcohol use.  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  ALLERGIES: MORPHINE.  MEDICATIONS: Accupril 40 mg q.d.  REVIEW OF SYSTEMS: Headache as above.  She denies changes in her speech or vision, any recent illnesses, fevers, chills, chest pain, shortness of breath, cough, palpitations, nausea, vomiting, diarrhea, difficulty with bladder or bowel, skin rash, or joint pain.  PHYSICAL EXAMINATION:  VITAL SIGNS: Temperature 99.2 degrees, blood pressure 148/81, pulse 65, respirations 18.  GENERAL: She is alert and in no distress.  Speech is fluent and not dysarthric.  Mood is euthymic and affect appropriate.  HEENT: Cranium normocephalic, atraumatic.  Oropharynx benign.  NECK: Supple without carotid bruits.  CHEST: Clear to auscultation.  HEART: Regular rate and rhythm without murmurs.  ABDOMEN: Nontender, nondistended.  Normoactive bowel sounds.  EXTREMITIES: Trace pretibial edema, normal pulses.  NEUROLOGIC: Cranial nerves, funduscopic examination benign.  Pupils equal and briskly reactive.  Extraocular movements were normal without nystagmus. Visual fields full to confrontation.  Face, tongue and palate move normally and symmetrically.  Motor, normal bulk and tone, normal strength in all tested extremity muscles.  Sensation intact to light touch and pinprick in all extremities.  Rapid alternating movements performed well.  Finger-to-nose intact.  Reflexes 2+ and symmetric.  Toes downgoing.  Gait normal.  She is able to toe walk a little bit.  LABORATORY DATA: CBC normal, i-STAT 8 normal.  EKG reveals normal sinus rhythm with no clear injury current.  CT of the head is personally reviewed and demonstrates  no acute abnormality.  IMPRESSION: Right brain transient ischemic attack, probably small vessel.  PLAN:  1. Admit for observation.  2. Intravenous heparin for now.  3. Repeat CT in the morning.  MRI is contraindicated due to her stapedectomy.  4. Carotid Doppler.  5. Echocardiogram.  6. If work-up  is negative will DC on aspirin.  7. Stroke physician to evaluate in a.m. Dictated by:   Kelli Hope, M.D. Attending Physician:  Fenton Malling DD:  11/20/01 TD:  11/21/01 Job: [id number]

## 2010-12-05 NOTE — H&P (Signed)
Kelly Wilson, Kelly Wilson                      ACCOUNT NO.:  [id number]   MEDICAL RECORD NO.:  [id number]                   PATIENT TYPE:  OBV   LOCATION:  0460                                 FACILITY:  [facility]   PHYSICIAN:  Gretta Cool, M.D.              DATE OF BIRTH:  1956-01-30   DATE OF ADMISSION:  03/29/2002  DATE OF DISCHARGE:                                HISTORY & PHYSICAL   CHIEF COMPLAINT:  Incapacitating and refractory cyclic pelvic pain.   HISTORY OF PRESENT ILLNESS:  The patient is a 55 year old gravida 2, para 2  with history of tubal sterilization.  She has had increasingly severe and  incapacitating predominantly right lower quadrant pain that is cyclic, most  severe mid cycle, also severe prior to onset of menses.  She has had work-up  by another gynecology office with CT scan and ultrasound that showed an  ovarian cyst, apparently functional in size and character.  She has a very  strong family history of endometriosis and highly suggestive symptomatology.  Her cycles remain approximately 24 days in length, but last seven to eight  days with heavy flow.  She has recently been placed on progesterone only  suppression of her cycles and has continued that for approximately six  weeks, but has not had significant benefit.  Her pain is increased by full  bladder and before bowel movement.  She describes pain like things are  falling out.  The intensity is such that she is unable to concentrate on her  work and daily activities.  After a period she is largely free of discomfort  for a week or two, only to have recurrent symptoms once again.   She is under the primary care by Dr. Romero Belling, treated for migraines,  hypertension.   PAST MEDICAL HISTORY:  Usual childhood disease without sequela.  Medical  illnesses:  Migraines, hypertension, and obesity.   PAST SURGICAL HISTORY:  Ear surgery 2002. Dr. Lenard Forth.  In 1993 cesarean  section, Dr. Amedeo Kinsman.   Thyroid surgery 1980.   ALLERGIES:  MORPHINE causes itching, hives, and nausea/vomiting.   FAMILY HISTORY:  Father and mother are both living at advanced age.  Two  brothers have helped.  Mother has type 2 diabetes and endometriosis.  No  other known tendency to disease.   SOCIAL HISTORY:  Married.  Bookkeeper.  Husband works for a Recruitment consultant.  One child living at home.  One is grown and  on his own.  Habits:  Denies ethanol or tobacco use.  Denies recreational  drugs.   REVIEW OF SYMPTOMS:  HEENT:  Denies symptoms.  CARDIORESPIRATORY:  Denies  asthma, cough, bronchitis, shortness of breath.  GI/GU:  No frequency,  urgency, dysuria, change in bowel habits, food intolerance.   PHYSICAL EXAMINATION:  GENERAL:  Well-developed, but rather massively obese  white female with high abdomen to hip ratio.  HEENT:  Pupils  are equal, round, and reactive to light and accommodation.  Fundi not examined.  Oropharynx clear.  NECK:  Supple without mass, thyromegaly.  CHEST:  Clear PTA.  HEART:  Regular rhythm without murmur or cardiac enlargement.  BREASTS:  Soft without mass, nodes, nipple discharge.  ABDOMEN:  Enormous panniculus with pendulous abdomen and extreme abdomen to  hip ratio disproportion.  No palpable masses or organomegaly.  PELVIC:  External genitalia:  Normal female.  Vagina clean, rugose.  Cervix  is parous, large, descends approximately halfway to the vagina.  The upper  vagina, however, appears well supported.  The uterus is approximately 10  weeks size.  Adnexa clear.  Rectovaginal confirms.  EXTREMITIES:  Negative.  NEUROLOGIC:  Physiologic.   IMPRESSION:  1. Incapacitating cyclic pelvic pain with history strongly suggestive of     endometriosis refractory to conservative management.  2. Hypertension.  3. Obesity.  4. Strong family history of endometriosis, maternal family history of type 2     diabetes.   RECOMMENDATIONS:  I have recommended  __________ diagnostic laparoscopy,  possible laparoscopy assisted hysterectomy, possible TAH/BSO.  The patient  understands the alternatives and options, risks and benefits.  She is now  admitted for definitive evaluation by laparoscopy, possible hysterectomy as  above.                                               Gretta Cool, M.D.    CWL/MEDQ  D:  03/30/2002  T:  03/30/2002  Job:  [id number]   cc:   Gregary Signs A. Everardo All, M.D. [facility]
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
## 2010-12-05 NOTE — Op Note (Signed)
Kelly Wilson, Kelly Wilson                      ACCOUNT NO.:  [id number]   MEDICAL RECORD NO.:  [id number]                   PATIENT TYPE:  AMB   LOCATION:  DSC                                  FACILITY:  [facility]   PHYSICIAN:  Leonides Grills, M.D.                  DATE OF BIRTH:  1956-01-04   DATE OF PROCEDURE:  04/10/2003  DATE OF DISCHARGE:                                 OPERATIVE REPORT   PREOPERATIVE DIAGNOSIS:  1. Complication of hardware, left ankle.  2. Status post left distal tib-fib syndesmotic rupture with ankle     impingement.  3. Tenosynovitis of ankle.  4. Tibial spurs.  5. Loose body, left ankle.   POSTOPERATIVE DIAGNOSIS:  1. Complication of hardware, left ankle.  2. Status post left distal tib-fib syndesmotic rupture with ankle     impingement.  3. Tenosynovitis of ankle.  4. Tibial spurs.  5. Loose body, left ankle.  6. Osteochondral lesion, left lateral talar dome.   OPERATION PERFORMED:  1. Left ankle arthroscopy with extensive debridement.  2. Left excision of tibial spurs.  3. Left hardware removal, deep ankle.  4. Stress x-rays, left ankle.  5. Debridement and drilling of lateral talar dome osteochondral lesion.  6. Loose body removal, left ankle.   SURGEON:  Leonides Grills, M.D.   ASSISTANT:  Lianne Cure, P.A.   ANESTHESIA:  General endotracheal tube.   ESTIMATED BLOOD LOSS:  Minimal.   TOURNIQUET TIME:  None.   COMPLICATIONS:  None.   DISPOSITION:  Stable to PR.   INDICATIONS FOR PROCEDURE:  The patient is a 55 year old female who  sustained a distal tib-fib syndesmotic rupture and required open reduction  internal fixation six months ago.  She has had persistent ankle pain  anteriorly, anterolaterally with mechanical symptoms.  She was consented for  the above procedure.  All risks which include infection, neurovascular  injury, persistent pain, worsening pain, stiffness, persistent mechanical  symptoms and possibility of nonunion of  the distal tib-fib syndesmotic  ligaments with possible refixation were all explained.  Questions were  encouraged and answered.   DESCRIPTION OF PROCEDURE:  The patient was brought to the operating room and  placed in supine position after adequate general endotracheal tube  anesthesia was administered as well as Ancef 1g IV piggyback.  The left  lower extremity was then prepped and draped in sterile manner over  proximally placed thigh tourniquet which was not used.  We started the  procedure with a longitudinal incision over the syndesmotic screw head.  Dissection was carried down to the head and bone.  Screw was then removed  with difficulty.  The same procedure was done through a separate incision  for the second screw as well.  The wounds were copiously irrigated with  normal saline.  Stress x-rays were then obtained to see if there was any  opening of the distal tib-fib syndesmosis and there was  not.  At this point  we then instilled 20mL normal saline into the ankle through an anteromedial  portal once the anterior landmarks, namely the anterior tibialis as well as  superficial peroneal nerve were marked out.  The anteromedial portal was  then created with a nick and spread technique, a blunt tip cannula was  introduced followed by the camera under direct visualization.  The  anterolateral portal was then created with a spinal needle and again with a  nick and spread technique.  Once this was done with instrumentation and  visualization in the joint, there was a very large anterior distal tibial  osteophyte as well as a lateral talar dome osteochondral lesion as well as  extensive synovitis throughout the ankle.  Once the synovitis was trimmed  back with an [employer] wand as well as a shaver, we then addressed the  lateral talar dome osteochondral lesion with a shaver followed by a  microfracture technique with debridement and drilling of this area.  This  had excellent repair of  the osteochondral lesion.  We then debrided both the  medial and lateral gutters of the ankle and then proceeded to remove the  anterior distal tibial osteophyte with the aid of a curved quarter inch  osteotome and a rongeur followed by a bur.  Once this was completely done  and debrided, there was also a loose body with the ankle that I had not  mentioned previously that was removed as well with a grabber.  Pictures were  obtained throughout the procedure.  Scope was removed.  The wounds were  closed with 4-0 nylon.  Sterile dressing was applied.  Cam walker boot was  applied.  The patient was stable to the PR.  The wound was then copiously irrigated with normal saline.  The skin was  closed with 4-0 nylon.  Sterile dressing was applied.  The patient was  stable to the PR.                                                   Leonides Grills, M.D.    PB/MEDQ  D:  04/10/2003  T:  04/11/2003  Job:  [id number]

## 2011-02-18 HISTORY — PX: ORIF ELBOW FRACTURE: SUR928

## 2011-02-18 HISTORY — PX: KNEE SURGERY: SHX244

## 2011-03-20 ENCOUNTER — Emergency Department (HOSPITAL_COMMUNITY)
Admission: EM | Admit: 2011-03-20 | Discharge: 2011-03-20 | Disposition: A | Discharge status: Home / Self Care | Payer: BC Managed Care – PPO | Attending: Emergency Medicine | Admitting: Emergency Medicine

## 2011-03-20 DIAGNOSIS — I10 Essential (primary) hypertension: Secondary | ICD-10-CM | POA: Insufficient documentation

## 2011-03-20 DIAGNOSIS — R112 Nausea with vomiting, unspecified: Secondary | ICD-10-CM | POA: Insufficient documentation

## 2011-03-20 DIAGNOSIS — R10819 Abdominal tenderness, unspecified site: Secondary | ICD-10-CM | POA: Insufficient documentation

## 2011-03-20 DIAGNOSIS — Z79899 Other long term (current) drug therapy: Secondary | ICD-10-CM | POA: Insufficient documentation

## 2011-03-20 DIAGNOSIS — K859 Acute pancreatitis without necrosis or infection, unspecified: Secondary | ICD-10-CM | POA: Insufficient documentation

## 2011-03-20 DIAGNOSIS — R109 Unspecified abdominal pain: Secondary | ICD-10-CM | POA: Insufficient documentation

## 2011-03-20 LAB — URINALYSIS, ROUTINE W REFLEX MICROSCOPIC
Glucose, UA: NEGATIVE mg/dL
Leukocytes, UA: NEGATIVE
Nitrite: NEGATIVE
Protein, ur: NEGATIVE mg/dL
pH: 5.5 (ref 5.0–8.0)

## 2011-03-20 LAB — COMPREHENSIVE METABOLIC PANEL
ALT: 9 U/L (ref 0–35)
Albumin: 3.6 g/dL (ref 3.5–5.2)
Alkaline Phosphatase: 69 U/L (ref 39–117)
Calcium: 10 mg/dL (ref 8.4–10.5)
GFR calc Af Amer: 57 mL/min — ABNORMAL LOW (ref >60)
Glucose, Bld: 111 mg/dL — ABNORMAL HIGH (ref 70–99)
Potassium: 3.3 mEq/L — ABNORMAL LOW (ref 3.5–5.1)
Sodium: 137 mEq/L (ref 135–145)
Total Protein: 7.1 g/dL (ref 6.0–8.3)

## 2011-03-20 LAB — CBC
Hemoglobin: 12.8 g/dL (ref 12.0–15.0)
MCH: 28.6 pg (ref 26.0–34.0)
MCHC: 35.1 g/dL (ref 30.0–36.0)
Platelets: 384 10*3/uL (ref 150–400)
RDW: 13.4 % (ref 11.5–15.5)

## 2011-03-20 LAB — DIFFERENTIAL
Basophils Absolute: 0 10*3/uL (ref 0.0–0.1)
Eosinophils Relative: 1 % (ref 0–5)
Lymphocytes Relative: 27 % (ref 12–46)
Monocytes Absolute: 0.5 10*3/uL (ref 0.1–1.0)
Monocytes Relative: 8 % (ref 3–12)
Neutro Abs: 3.8 10*3/uL (ref 1.7–7.7)

## 2011-03-20 LAB — URINE MICROSCOPIC-ADD ON

## 2011-06-03 ENCOUNTER — Other Ambulatory Visit: Payer: Self-pay | Admitting: Orthopaedic Surgery

## 2011-06-03 DIAGNOSIS — M549 Dorsalgia, unspecified: Secondary | ICD-10-CM

## 2011-06-03 DIAGNOSIS — M25552 Pain in left hip: Secondary | ICD-10-CM

## 2011-06-09 ENCOUNTER — Telehealth: Payer: Self-pay | Admitting: *Deleted

## 2011-06-09 NOTE — Telephone Encounter (Signed)
Immunization report received from [employer] given: Influenza (Afluria) Date administered: 06/01/2011 Route/Injection Site: Left Arm/Deltoid Dose: 0.5 mL Lot #: [id number] Exp Date: 01/17/2012 VIS: 01/19/2011

## 2011-06-10 NOTE — Patient Instructions (Signed)

## 2011-06-12 ENCOUNTER — Ambulatory Visit
Admission: RE | Admit: 2011-06-12 | Discharge: 2011-06-12 | Disposition: A | Discharge status: Home / Self Care | Payer: BC Managed Care – PPO | Source: Ambulatory Visit | Attending: Orthopaedic Surgery | Admitting: Orthopaedic Surgery

## 2011-06-12 DIAGNOSIS — M25552 Pain in left hip: Secondary | ICD-10-CM

## 2011-06-12 DIAGNOSIS — M549 Dorsalgia, unspecified: Secondary | ICD-10-CM

## 2011-06-12 MED ORDER — DIAZEPAM 5 MG PO TABS
10.0000 mg | ORAL_TABLET | Freq: Once | ORAL | Status: AC
  Administered 2011-06-12: 10 mg via ORAL

## 2011-06-12 MED ORDER — IOHEXOL 180 MG/ML  SOLN
17.0000 mL | Freq: Once | INTRAMUSCULAR | Status: AC | PRN
  Administered 2011-06-12: 17 mL via INTRAVENOUS

## 2011-08-14 ENCOUNTER — Emergency Department (INDEPENDENT_AMBULATORY_CARE_PROVIDER_SITE_OTHER)
Admission: EM | Admit: 2011-08-14 | Discharge: 2011-08-14 | Disposition: A | Discharge status: Home / Self Care | Payer: BC Managed Care – PPO | Source: Home / Self Care | Attending: Emergency Medicine | Admitting: Emergency Medicine

## 2011-08-14 ENCOUNTER — Encounter (HOSPITAL_COMMUNITY): Payer: Self-pay

## 2011-08-14 DIAGNOSIS — S61209A Unspecified open wound of unspecified finger without damage to nail, initial encounter: Secondary | ICD-10-CM

## 2011-08-14 DIAGNOSIS — S61219A Laceration without foreign body of unspecified finger without damage to nail, initial encounter: Secondary | ICD-10-CM

## 2011-08-14 NOTE — ED Notes (Signed)
States she was preparing meal, when she accidentally sliced left index  finger w knife; laceration aprox 2 cm to distal portion of finger, bleeding controlled at present
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
## 2011-10-10 ENCOUNTER — Other Ambulatory Visit: Payer: Self-pay | Admitting: Endocrinology

## 2011-10-23 ENCOUNTER — Ambulatory Visit (INDEPENDENT_AMBULATORY_CARE_PROVIDER_SITE_OTHER): Payer: BC Managed Care – PPO | Admitting: Internal Medicine

## 2011-10-23 ENCOUNTER — Encounter: Payer: Self-pay | Admitting: Internal Medicine

## 2011-10-23 VITALS — BP 122/80 | HR 86 | Temp 98.5°F | Resp 16 | Ht 66.0 in | Wt 235.0 lb

## 2011-10-23 DIAGNOSIS — I10 Essential (primary) hypertension: Secondary | ICD-10-CM

## 2011-10-23 DIAGNOSIS — L049 Acute lymphadenitis, unspecified: Secondary | ICD-10-CM | POA: Insufficient documentation

## 2011-10-23 DIAGNOSIS — E042 Nontoxic multinodular goiter: Secondary | ICD-10-CM

## 2011-10-23 MED ORDER — DOXYCYCLINE MONOHYDRATE 100 MG PO CAPS: 100.0000 mg | ORAL_CAPSULE | Freq: Two times a day (BID) | ORAL | Status: AC

## 2011-10-23 NOTE — Assessment & Plan Note (Signed)
I think she has a bacterial lymph gland infection so I have started her on doxycycline, also I will check her labs to look for lymphoproliferative disease, mono, etc.

## 2011-10-23 NOTE — Assessment & Plan Note (Signed)
Her BP is well controlled, I will check her lytes and renal function today 

## 2011-10-23 NOTE — Patient Instructions (Signed)
Hypertension As your heart beats, it forces blood through your arteries. This force is your blood pressure. If the pressure is too high, it is called hypertension (HTN) or high blood pressure. HTN is dangerous because you may have it and not know it. High blood pressure may mean that your heart has to work harder to pump blood. Your arteries may be narrow or stiff. The extra work puts you at risk for heart disease, stroke, and other problems.  Blood pressure consists of two numbers, a higher number over a lower, 110/72, for example. It is stated as "110 over 72." The ideal is below 120 for the top number (systolic) and under 80 for the bottom (diastolic). Write down your blood pressure today. You should pay close attention to your blood pressure if you have certain conditions such as:  Heart failure.   Prior heart attack.   Diabetes   Chronic kidney disease.   Prior stroke.   Multiple risk factors for heart disease.  To see if you have HTN, your blood pressure should be measured while you are seated with your arm held at the level of the heart. It should be measured at least twice. A one-time elevated blood pressure reading (especially in the Emergency Department) does not mean that you need treatment. There may be conditions in which the blood pressure is different between your right and left arms. It is important to see your caregiver soon for a recheck. Most people have essential hypertension which means that there is not a specific cause. This type of high blood pressure may be lowered by changing lifestyle factors such as:  Stress.   Smoking.   Lack of exercise.   Excessive weight.   Drug/tobacco/alcohol use.   Eating less salt.  Most people do not have symptoms from high blood pressure until it has caused damage to the body. Effective treatment can often prevent, delay or reduce that damage. TREATMENT  When a cause has been identified, treatment for high blood pressure is  directed at the cause. There are a large number of medications to treat HTN. These fall into several categories, and your caregiver will help you select the medicines that are best for you. Medications may have side effects. You should review side effects with your caregiver. If your blood pressure stays high after you have made lifestyle changes or started on medicines,   Your medication(s) may need to be changed.   Other problems may need to be addressed.   Be certain you understand your prescriptions, and know how and when to take your medicine.   Be sure to follow up with your caregiver within the time frame advised (usually within two weeks) to have your blood pressure rechecked and to review your medications.   If you are taking more than one medicine to lower your blood pressure, make sure you know how and at what times they should be taken. Taking two medicines at the same time can result in blood pressure that is too low.  SEEK IMMEDIATE MEDICAL CARE IF:  You develop a severe headache, blurred or changing vision, or confusion.   You have unusual weakness or numbness, or a faint feeling.   You have severe chest or abdominal pain, vomiting, or breathing problems.  MAKE SURE YOU:   Understand these instructions.   Will watch your condition.   Will get help right away if you are not doing well or get worse.  Document Released: 07/06/2005 Document Revised: 06/25/2011 Document Reviewed:   02/24/2008 [employer] Patient Information 2012 [place], [place].Lymphadenopathy Lymphadenopathy means "disease of the lymph glands." But the term is usually used to describe swollen or enlarged lymph glands, also called lymph nodes. These are the bean-shaped organs found in many locations including the neck, underarm, and groin. Lymph glands are part of the immune system, which fights infections in your body. Lymphadenopathy can occur in just one area of the body, such as the neck, or it can be  generalized, with lymph node enlargement in several areas. The nodes found in the neck are the most common sites of lymphadenopathy. CAUSES  When your immune system responds to germs (such as viruses or bacteria ), infection-fighting cells and fluid build up. This causes the glands to grow in size. This is usually not something to worry about. Sometimes, the glands themselves can become infected and inflamed. This is called lymphadenitis. Enlarged lymph nodes can be caused by many diseases:  Bacterial disease, such as strep throat or a skin infection.   Viral disease, such as a common cold.   Other germs, such as lyme disease, tuberculosis, or sexually transmitted diseases.   Cancers, such as lymphoma (cancer of the lymphatic system) or leukemia (cancer of the white blood cells).   Inflammatory diseases such as lupus or rheumatoid arthritis.   Reactions to medications.  Many of the diseases above are rare, but important. This is why you should see your caregiver if you have lymphadenopathy. SYMPTOMS   Swollen, enlarged lumps in the neck, back of the head or other locations.   Tenderness.   Warmth or redness of the skin over the lymph nodes.   Fever.  DIAGNOSIS  Enlarged lymph nodes are often near the source of infection. They can help healthcare providers diagnose your illness. For instance:   Swollen lymph nodes around the jaw might be caused by an infection in the mouth.   Enlarged glands in the neck often signal a throat infection.   Lymph nodes that are swollen in more than one area often indicate an illness caused by a virus.  Your caregiver most likely will know what is causing your lymphadenopathy after listening to your history and examining you. Blood tests, x-rays or other tests may be needed. If the cause of the enlarged lymph node cannot be found, and it does not go away by itself, then a biopsy may be needed. Your caregiver will discuss this with you. TREATMENT    Treatment for your enlarged lymph nodes will depend on the cause. Many times the nodes will shrink to normal size by themselves, with no treatment. Antibiotics or other medicines may be needed for infection. Only take over-the-counter or prescription medicines for pain, discomfort or fever as directed by your caregiver. HOME CARE INSTRUCTIONS  Swollen lymph glands usually return to normal when the underlying medical condition goes away. If they persist, contact your health-care provider. He/she might prescribe antibiotics or other treatments, depending on the diagnosis. Take any medications exactly as prescribed. Keep any follow-up appointments made to check on the condition of your enlarged nodes.  SEEK MEDICAL CARE IF:   Swelling lasts for more than two weeks.   You have symptoms such as weight loss, night sweats, fatigue or fever that does not go away.   The lymph nodes are hard, seem fixed to the skin or are growing rapidly.   Skin over the lymph nodes is red and inflamed. This could mean there is an infection.  SEEK IMMEDIATE MEDICAL CARE IF:  Fluid starts leaking from the area of the enlarged lymph node.   You develop a fever of 102 F (38.9 C) or greater.   Severe pain develops (not necessarily at the site of a large lymph node).   You develop chest pain or shortness of breath.   You develop worsening abdominal pain.  MAKE SURE YOU:   Understand these instructions.   Will watch your condition.   Will get help right away if you are not doing well or get worse.  Document Released: 04/14/2008 Document Revised: 06/25/2011 Document Reviewed: 04/14/2008 [facility] Patient Information 2012 [place], [place].

## 2011-10-23 NOTE — Assessment & Plan Note (Signed)
Exam is normal today, I will check her TSH

## 2011-10-23 NOTE — Progress Notes (Signed)
Subjective:    Patient ID: Kelly Wilson, female    DOB: 1956/06/06, 56 y.o.   MRN: [id number]  HPI New to me she complains of a 3 week history of painful lymph nodes in her neck, the R is more painful than the L. She has also had a mild headache.   Review of Systems  Constitutional: Negative for fever, chills, diaphoresis, activity change, appetite change, fatigue and unexpected weight change.  HENT: Negative for ear pain, sore throat, facial swelling, trouble swallowing, neck pain, neck stiffness, voice change, sinus pressure and ear discharge.   Eyes: Negative.   Respiratory: Negative for apnea, cough, choking, chest tightness, shortness of breath, wheezing and stridor.   Cardiovascular: Negative for chest pain, palpitations and leg swelling.  Gastrointestinal: Negative for nausea, vomiting, abdominal pain, diarrhea, constipation, blood in stool, abdominal distention, anal bleeding and rectal pain.  Genitourinary: Negative.   Musculoskeletal: Negative for myalgias, back pain, joint swelling, arthralgias and gait problem.  Skin: Negative for color change, pallor, rash and wound.  Hematological: Positive for adenopathy. Does not bruise/bleed easily.  Psychiatric/Behavioral: Negative.        Objective:   Physical Exam  Vitals reviewed. Constitutional: She is oriented to person, place, and time. She appears well-developed and well-nourished.  Non-toxic appearance. She does not have a sickly appearance. She does not appear ill. No distress.  HENT:  Head: Normocephalic and atraumatic.  Mouth/Throat: Oropharynx is clear and moist. No oropharyngeal exudate.  Eyes: Conjunctivae are normal. Right eye exhibits no discharge. Left eye exhibits no discharge. No scleral icterus.  Neck: Normal range of motion. Neck supple. No JVD present. No tracheal deviation present. No thyromegaly present.  Cardiovascular: Normal rate, regular rhythm, normal heart sounds and intact distal pulses.  Exam  reveals no gallop and no friction rub.   No murmur heard. Pulmonary/Chest: Effort normal and breath sounds normal. No stridor. No respiratory distress. She has no wheezes. She has no rales. She exhibits no tenderness.  Abdominal: Soft. Bowel sounds are normal. She exhibits no distension and no mass. There is no tenderness. There is no rebound and no guarding.  Musculoskeletal: Normal range of motion. She exhibits no edema and no tenderness.  Lymphadenopathy:       Head (right side): No submental, no submandibular, no tonsillar, no preauricular, no posterior auricular and no occipital adenopathy present.       Head (left side): No submental, no submandibular, no tonsillar, no preauricular, no posterior auricular and no occipital adenopathy present.    She has cervical adenopathy.       Right cervical: Superficial cervical adenopathy present. No deep cervical and no posterior cervical adenopathy present.      Left cervical: Superficial cervical adenopathy present. No deep cervical and no posterior cervical adenopathy present.    She has no axillary adenopathy.       Right axillary: No pectoral and no lateral adenopathy present.       Left axillary: No pectoral and no lateral adenopathy present.      Right: No inguinal, no supraclavicular and no epitrochlear adenopathy present.       Left: No inguinal, no supraclavicular and no epitrochlear adenopathy present.  Neurological: She is oriented to person, place, and time.  Skin: Skin is warm and dry. No rash noted. She is not diaphoretic. No erythema. No pallor.  Psychiatric: She has a normal mood and affect. Her behavior is normal. Judgment and thought content normal.  Assessment & Plan:

## 2012-02-17 ENCOUNTER — Other Ambulatory Visit: Payer: Self-pay | Admitting: Endocrinology

## 2012-02-25 ENCOUNTER — Other Ambulatory Visit: Payer: Self-pay | Admitting: *Deleted

## 2012-02-25 DIAGNOSIS — Z0000 Encounter for general adult medical examination without abnormal findings: Secondary | ICD-10-CM

## 2012-03-15 ENCOUNTER — Encounter: Payer: BC Managed Care – PPO | Admitting: Endocrinology

## 2012-04-04 ENCOUNTER — Encounter: Payer: BC Managed Care – PPO | Admitting: Endocrinology

## 2012-04-15 ENCOUNTER — Other Ambulatory Visit (INDEPENDENT_AMBULATORY_CARE_PROVIDER_SITE_OTHER): Payer: BC Managed Care – PPO

## 2012-04-15 DIAGNOSIS — Z0000 Encounter for general adult medical examination without abnormal findings: Secondary | ICD-10-CM

## 2012-04-15 LAB — URINALYSIS, ROUTINE W REFLEX MICROSCOPIC
Bilirubin Urine: NEGATIVE
Hgb urine dipstick: NEGATIVE
Ketones, ur: NEGATIVE
Leukocytes, UA: NEGATIVE
Nitrite: NEGATIVE
Specific Gravity, Urine: 1.01
Total Protein, Urine: NEGATIVE
Urine Glucose: NEGATIVE
Urobilinogen, UA: 0.2
pH: 7.5 (ref 5.0–8.0)

## 2012-04-15 LAB — BASIC METABOLIC PANEL
BUN: 16 mg/dL (ref 6–23)
Chloride: 108 mEq/L (ref 96–112)
Creatinine, Ser: 0.9 mg/dL (ref 0.4–1.2)
Glucose, Bld: 87 mg/dL (ref 70–99)
Potassium: 4.1 mEq/L (ref 3.5–5.1)

## 2012-04-15 LAB — HEPATIC FUNCTION PANEL
ALT: 17 U/L (ref 0–35)
AST: 20 U/L (ref 0–37)
Albumin: 3.6 g/dL (ref 3.5–5.2)
Alkaline Phosphatase: 50 U/L (ref 39–117)
Bilirubin, Direct: 0.1 mg/dL (ref 0.0–0.3)
Total Bilirubin: 0.6 mg/dL (ref 0.3–1.2)
Total Protein: 6.3 g/dL (ref 6.0–8.3)

## 2012-04-15 LAB — CBC WITH DIFFERENTIAL/PLATELET
Basophils Absolute: 0 K/uL (ref 0.0–0.1)
Basophils Relative: 0.6 % (ref 0.0–3.0)
Eosinophils Absolute: 0.1 K/uL (ref 0.0–0.7)
Eosinophils Relative: 2.7 % (ref 0.0–5.0)
HCT: 40.4 % (ref 36.0–46.0)
Hemoglobin: 13.6 g/dL (ref 12.0–15.0)
Lymphocytes Relative: 38 % (ref 12.0–46.0)
Lymphs Abs: 1.7 K/uL (ref 0.7–4.0)
MCHC: 33.7 g/dL (ref 30.0–36.0)
MCV: 86.1 fl (ref 78.0–100.0)
Monocytes Absolute: 0.3 K/uL (ref 0.1–1.0)
Monocytes Relative: 6.7 % (ref 3.0–12.0)
Neutro Abs: 2.3 K/uL (ref 1.4–7.7)
Neutrophils Relative %: 52 % (ref 43.0–77.0)
Platelets: 250 K/uL (ref 150.0–400.0)
RBC: 4.69 Mil/uL (ref 3.87–5.11)
RDW: 14.2 % (ref 11.5–14.6)
WBC: 4.4 K/uL — ABNORMAL LOW (ref 4.5–10.5)

## 2012-04-15 LAB — TSH: TSH: 4.24 u[IU]/mL (ref 0.35–5.50)

## 2012-04-15 LAB — LIPID PANEL
Cholesterol: 234 mg/dL — ABNORMAL HIGH (ref 0–200)
HDL: 62.5 mg/dL
Total CHOL/HDL Ratio: 4
Triglycerides: 83 mg/dL (ref 0.0–149.0)
VLDL: 16.6 mg/dL (ref 0.0–40.0)

## 2012-04-15 LAB — LDL CHOLESTEROL, DIRECT: Direct LDL: 143.4 mg/dL

## 2012-04-21 ENCOUNTER — Ambulatory Visit (INDEPENDENT_AMBULATORY_CARE_PROVIDER_SITE_OTHER): Payer: BC Managed Care – PPO | Admitting: Endocrinology

## 2012-04-21 VITALS — BP 150/82 | HR 68 | Temp 97.7°F | Resp 16 | Ht 66.75 in | Wt 240.6 lb

## 2012-04-21 DIAGNOSIS — R059 Cough, unspecified: Secondary | ICD-10-CM | POA: Insufficient documentation

## 2012-04-21 DIAGNOSIS — R05 Cough: Secondary | ICD-10-CM

## 2012-04-21 DIAGNOSIS — E785 Hyperlipidemia, unspecified: Secondary | ICD-10-CM

## 2012-04-21 NOTE — Progress Notes (Signed)
Subjective:    Patient ID: Kelly Wilson, female    DOB: [birth year], 56 y.o.   MRN: [id number]  HPI here for regular wellness examination.  He's feeling pretty well in general, and says chronic med probs are stable, except as noted below Past Medical History  Diagnosis Date  . Hyperlipidemia   . Hypertension   . 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 HYPERLIPIDEMIA 05/17/2007  . HYPERTENSION 05/17/2007  . GERD 08/09/2008  . OSTEOARTHRITIS 05/17/2007  . SPINAL STENOSIS 05/17/2007  . TRANSIENT ISCHEMIC ATTACK, HX OF 05/17/2007  . Left ear hearing loss     Mild    Past Surgical History  Procedure Date  . Abdominal hysterectomy   . Tubal ligation   . External ear surgery     left side x's 2  . Stapedectomy   . Throidectomy   . Orif elbow fracture   . Skin graft full thickness arm   . Skin graft full thickness leg     History   Social History  . Marital Status: Married    Spouse Name: N/A    Number of Children: N/A  . Years of Education: N/A   Occupational History  . Not on file.   Social History Main Topics  . Smoking status: Never Smoker   . Smokeless tobacco: Not on file   Comment: Married, she is a Veterinary surgeon  . Alcohol Use: No  . Drug Use: No  . Sexually Active:    Other Topics Concern  . Not on file   Social History Narrative  . No narrative on file    Current Outpatient Prescriptions on File Prior to Visit  Medication Sig Dispense Refill  . amLODipine (NORVASC) 10 MG tablet Take 10 mg by mouth daily.        Marland Kitchen aspirin 81 MG EC tablet Take 81 mg by mouth daily.        Marland Kitchen estradiol (VIVELLE-DOT) 0.05 MG/24HR Place 1 patch onto the skin once a week. As directed by Dr. Nicholas Lose       . omeprazole (PRILOSEC) 40 MG capsule Take 40 mg by mouth daily.        Marland Kitchen triamterene-hydrochlorothiazide (MAXZIDE-25) 37.5-25 MG per tablet take 1 tablet by mouth once daily  30 tablet  3  . fluticasone (FLONASE) 50 MCG/ACT nasal spray 2 sprays by Nasal route daily.  16 g  1     Allergies  Allergen Reactions  . Morphine Hives    REACTION: hives  . Rosuvastatin     REACTION: myalgia   Family History  Problem Relation Age of Onset  . Coronary artery disease Mother   . Diabetes Mother   . Coronary artery disease Father   . Diabetes Sister     2 sister with DM  . Cancer Other     uncle had rectal cancer  . Hypertension Other   . Hyperlipidemia Other    BP 150/82  Pulse 68  Temp 97.7 F (36.5 C) (Oral)  Resp 16  Ht 5' 6.75" (1.695 m)  Wt 240 lb 9 oz (109.118 kg)  BMI 37.96 kg/m2  SpO2 97%  Review of Systems  Constitutional: Negative for fever and unexpected weight change.  HENT: Negative for hearing loss.   Eyes: Negative for visual disturbance.  Respiratory: Negative for shortness of breath.   Cardiovascular: Negative for chest pain.  Gastrointestinal: Negative for anal bleeding.  Genitourinary: Negative for hematuria.  Musculoskeletal: Positive for back pain.  Skin: Negative for rash.  Neurological:  Negative for syncope and headaches.  Hematological: Does not bruise/bleed easily.  Psychiatric/Behavioral: Negative for dysphoric mood.       Objective:   Physical Exam VS: see vs page GEN: no distress HEAD: head: no deformity eyes: no periorbital swelling, no proptosis external nose and ears are normal mouth: no lesion seen NECK: a healed scar is present.  i do not appreciate a nodule in the thyroid or elsewhere in the neck CHEST WALL: no deformity BREASTS:  sees gyn CV: reg rate and rhythm, no murmur ABD: abdomen is soft, nontender.  no hepatosplenomegaly.  not distended.  no hernia GENITALIA:/RECTAL: sees gyn MUSCULOSKELETAL: muscle bulk and strength are grossly normal.  no obvious joint swelling.  gait is normal and steady EXTEMITIES: no deformity.  no ulcer on the feet.  feet are of normal color and temp.  no edema PULSES: dorsalis pedis intact bilat.  no carotid bruit NEURO:  cn 2-12 grossly intact.   readily moves all 4's.   sensation is intact to touch on the feet SKIN:  Normal texture and temperature.  No rash or suspicious lesion is visible.  Skin grafts at right knee and right elbow.   NODES:  None palpable at the neck PSYCH: alert, oriented x3.  Does not appear anxious nor depressed.         Assessment & Plan:  Wellness visit today, with problems stable, except as noted.   SEPARATE EVALUATION FOLLOWS--EACH PROBLEM HERE IS NEW, NOT RESPONDING TO TREATMENT, OR POSES SIGNIFICANT RISK TO THE PATIENT'S HEALTH: HISTORY OF THE PRESENT ILLNESS: Pt states 2 months of slight dry-quality cough in the chest, but no assoc sob PAST MEDICAL HISTORY reviewed and up to date today REVIEW OF SYSTEMS: Denies sore throat PHYSICAL EXAMINATION: VITAL SIGNS:  See vs page GENERAL: no distress LUNGS:  Clear to auscultation IMPRESSION: Cough, new, uncertain etiology PLAN: See instruction page

## 2012-04-21 NOTE — Patient Instructions (Addendum)
Please check your cardiogram and chest-x-ray at the [place] office please consider these measures for your health:  minimize alcohol.  do not use tobacco products.  have a colonoscopy at least every 10 years from age 58.  Women should have an annual mammogram from age 4.  keep firearms safely stored.  always use seat belts.  have working smoke alarms in your home.  see an eye doctor and dentist regularly.  never drive under the influence of alcohol or drugs (including prescription drugs).  those with fair skin should take precautions against the sun. Please return in 1 year.

## 2012-04-22 ENCOUNTER — Ambulatory Visit (INDEPENDENT_AMBULATORY_CARE_PROVIDER_SITE_OTHER)
Admission: RE | Admit: 2012-04-22 | Discharge: 2012-04-22 | Disposition: A | Discharge status: Home / Self Care | Payer: BC Managed Care – PPO | Source: Ambulatory Visit | Attending: Endocrinology | Admitting: Endocrinology

## 2012-04-22 DIAGNOSIS — R059 Cough, unspecified: Secondary | ICD-10-CM

## 2012-04-22 DIAGNOSIS — R05 Cough: Secondary | ICD-10-CM

## 2012-06-07 ENCOUNTER — Other Ambulatory Visit: Payer: Self-pay | Admitting: Gynecology

## 2012-06-28 ENCOUNTER — Other Ambulatory Visit: Payer: Self-pay | Admitting: Endocrinology

## 2013-03-02 ENCOUNTER — Ambulatory Visit: Payer: BC Managed Care – PPO | Admitting: Endocrinology

## 2013-06-28 ENCOUNTER — Other Ambulatory Visit: Payer: Self-pay | Admitting: Endocrinology

## 2013-07-28 ENCOUNTER — Other Ambulatory Visit: Payer: Self-pay

## 2013-07-28 DIAGNOSIS — Z0000 Encounter for general adult medical examination without abnormal findings: Secondary | ICD-10-CM

## 2013-08-03 ENCOUNTER — Other Ambulatory Visit (INDEPENDENT_AMBULATORY_CARE_PROVIDER_SITE_OTHER): Payer: BC Managed Care – PPO

## 2013-08-03 ENCOUNTER — Encounter: Payer: BC Managed Care – PPO | Admitting: Endocrinology

## 2013-08-03 DIAGNOSIS — Z0000 Encounter for general adult medical examination without abnormal findings: Secondary | ICD-10-CM

## 2013-08-03 LAB — CBC WITH DIFFERENTIAL/PLATELET
BASOS PCT: 0.7 % (ref 0.0–3.0)
Basophils Absolute: 0 10*3/uL (ref 0.0–0.1)
EOS ABS: 0.1 10*3/uL (ref 0.0–0.7)
Eosinophils Relative: 2.6 % (ref 0.0–5.0)
HEMATOCRIT: 37.5 % (ref 36.0–46.0)
HEMOGLOBIN: 12.5 g/dL (ref 12.0–15.0)
LYMPHS ABS: 1.5 10*3/uL (ref 0.7–4.0)
Lymphocytes Relative: 27.5 % (ref 12.0–46.0)
MCHC: 33.4 g/dL (ref 30.0–36.0)
MCV: 79 fl (ref 78.0–100.0)
MONO ABS: 0.3 10*3/uL (ref 0.1–1.0)
Monocytes Relative: 5.9 % (ref 3.0–12.0)
NEUTROS ABS: 3.4 10*3/uL (ref 1.4–7.7)
Neutrophils Relative %: 63.3 % (ref 43.0–77.0)
Platelets: 284 10*3/uL (ref 150.0–400.0)
RBC: 4.74 Mil/uL (ref 3.87–5.11)
RDW: 13.8 % (ref 11.5–14.6)
WBC: 5.4 10*3/uL (ref 4.5–10.5)

## 2013-08-03 LAB — BASIC METABOLIC PANEL WITH GFR
BUN: 25 mg/dL — AB (ref 6–23)
CO2: 21 mEq/L (ref 19–32)
CREATININE: 0.91 mg/dL (ref 0.50–1.10)
Calcium: 9.4 mg/dL (ref 8.4–10.5)
Chloride: 109 mEq/L (ref 96–112)
GFR, EST AFRICAN AMERICAN: 80 mL/min
GFR, EST NON AFRICAN AMERICAN: 69 mL/min
GLUCOSE: 87 mg/dL (ref 70–99)
Potassium: 4.3 mEq/L (ref 3.5–5.3)
Sodium: 142 mEq/L (ref 135–145)

## 2013-08-03 LAB — MICROALBUMIN / CREATININE URINE RATIO
Creatinine,U: 172.5 mg/dL
Microalb Creat Ratio: 0.2 mg/g (ref 0.0–30.0)
Microalb, Ur: 0.4 mg/dL (ref 0.0–1.9)

## 2013-08-03 LAB — HEMOGLOBIN A1C: Hgb A1c MFr Bld: 5.4 % (ref 4.6–6.5)

## 2013-08-03 LAB — LIPID PANEL
Cholesterol: 265 mg/dL — ABNORMAL HIGH (ref 0–200)
HDL: 68.6 mg/dL (ref >39.00)
Total CHOL/HDL Ratio: 4
Triglycerides: 132 mg/dL (ref 0.0–149.0)
VLDL: 26.4 mg/dL (ref 0.0–40.0)

## 2013-08-03 LAB — LDL CHOLESTEROL, DIRECT: Direct LDL: 171.2 mg/dL

## 2013-08-04 LAB — HEPATIC FUNCTION PANEL (6)
ALBUMIN: 4.2 g/dL (ref 3.5–5.5)
ALK PHOS: 59 IU/L (ref 39–117)
ALT: 7 IU/L (ref 0–32)
AST: 12 IU/L (ref 0–40)
BILIRUBIN DIRECT: 0.08 mg/dL (ref 0.00–0.40)
BILIRUBIN TOTAL: 0.3 mg/dL (ref 0.0–1.2)

## 2013-08-11 ENCOUNTER — Telehealth: Payer: Self-pay | Admitting: Endocrinology

## 2013-08-11 ENCOUNTER — Encounter: Payer: Self-pay | Admitting: Endocrinology

## 2013-08-11 ENCOUNTER — Ambulatory Visit (INDEPENDENT_AMBULATORY_CARE_PROVIDER_SITE_OTHER): Payer: BC Managed Care – PPO | Admitting: Endocrinology

## 2013-08-11 VITALS — BP 120/60 | HR 74 | Temp 98.7°F | Ht 66.75 in | Wt 207.0 lb

## 2013-08-11 DIAGNOSIS — Z0000 Encounter for general adult medical examination without abnormal findings: Secondary | ICD-10-CM

## 2013-08-11 MED ORDER — METHOCARBAMOL 500 MG PO TABS: 500.0000 mg | ORAL_TABLET | Freq: Every day | ORAL | Status: DC

## 2013-08-11 NOTE — Telephone Encounter (Signed)
Please call GI.  When is pt due for next colonoscopy? Please then call pt, and tell her when.

## 2013-08-11 NOTE — Patient Instructions (Signed)
please consider these measures for your health:  minimize alcohol.  do not use tobacco products.  have a colonoscopy at least every 10 years from age 60.  Women should have an annual mammogram from age 67.  keep firearms safely stored.  always use seat belts.  have working smoke alarms in your home.  see an eye doctor and dentist regularly.  never drive under the influence of alcohol or drugs (including prescription drugs).  those with fair skin should take precautions against the sun. i have sent a prescription to your pharmacy, for the leg cramps.   Please return in 1 year.

## 2013-08-11 NOTE — Telephone Encounter (Signed)
Called GI they stated that pt has never had a colonoscopy done with them. Called pt and informed, she stated that she had one done when she was 48 at a day surgery place on [address]. Pt stated that she could not remember the place or the number. She stated that when she goes for an appointment wither her gynecologist she would enquire there and have the records faxed to our office.

## 2013-08-11 NOTE — Progress Notes (Signed)
Subjective:    Patient ID: Kelly Wilson, female    DOB: 13-Apr-1956, 58 y.o.   MRN: [id number]  HPI Pt is here for regular wellness examination, and is feeling pretty well in general, and says chronic med probs are stable, except as noted below Past Medical History  Diagnosis Date  . Hyperlipidemia   . Hypertension   . 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 HYPERLIPIDEMIA 05/17/2007  . HYPERTENSION 05/17/2007  . GERD 08/09/2008  . OSTEOARTHRITIS 05/17/2007  . SPINAL STENOSIS 05/17/2007  . TRANSIENT ISCHEMIC ATTACK, HX OF 05/17/2007  . Left ear hearing loss     Mild    Past Surgical History  Procedure Laterality Date  . Abdominal hysterectomy    . Tubal ligation    . External ear surgery      left side x's 2  . Stapedectomy    . Throidectomy    . Orif elbow fracture    . Skin graft full thickness arm    . Skin graft full thickness leg      History   Social History  . Marital Status: Married    Spouse Name: N/A    Number of Children: N/A  . Years of Education: N/A   Occupational History  . Not on file.   Social History Main Topics  . Smoking status: Never Smoker   . Smokeless tobacco: Not on file     Comment: Married, she is a Cabin crew  . Alcohol Use: No  . Drug Use: No  . Sexual Activity:    Other Topics Concern  . Not on file   Social History Narrative  . No narrative on file    Current Outpatient Prescriptions on File Prior to Visit  Medication Sig Dispense Refill  . aspirin 81 MG EC tablet Take 81 mg by mouth daily.        Marland Kitchen omeprazole (PRILOSEC) 40 MG capsule Take 40 mg by mouth daily.        Marland Kitchen triamterene-hydrochlorothiazide (MAXZIDE-25) 37.5-25 MG per tablet take 1 tablet by mouth once daily  90 tablet  3  . amLODipine (NORVASC) 10 MG tablet Take 10 mg by mouth daily.        Marland Kitchen estradiol (VIVELLE-DOT) 0.05 MG/24HR Place 1 patch onto the skin once a week. As directed by Dr. Ubaldo Glassing       . fluticasone (FLONASE) 50 MCG/ACT nasal spray 2 sprays by  Nasal route daily.  16 g  1  . HYDROcodone-acetaminophen (NORCO/VICODIN) 5-325 MG per tablet        No current facility-administered medications on file prior to visit.    Allergies  Allergen Reactions  . Morphine Hives    REACTION: hives  . Rosuvastatin     REACTION: myalgia    Family History  Problem Relation Age of Onset  . Coronary artery disease Mother   . Diabetes Mother   . Coronary artery disease Father   . Diabetes Sister     2 sister with DM  . Cancer Other     uncle had rectal cancer  . Hypertension Other   . Hyperlipidemia Other     BP 120/60  Pulse 74  Temp(Src) 98.7 F (37.1 C) (Oral)  Ht 5' 6.75" (1.695 m)  Wt 207 lb (93.895 kg)  BMI 32.68 kg/m2  SpO2 99%  Review of Systems  Constitutional: Negative for fever.       Pt has lost weight, due to her efforts  HENT: Negative for hearing loss.  Eyes: Negative for visual disturbance.  Respiratory: Negative for shortness of breath.   Cardiovascular: Negative for chest pain.  Gastrointestinal: Negative for anal bleeding.  Endocrine: Positive for cold intolerance.  Genitourinary: Negative for hematuria.  Musculoskeletal: Negative for back pain.  Skin: Negative for rash.  Allergic/Immunologic: Negative for environmental allergies.  Neurological: Negative for syncope.  Hematological: Does not bruise/bleed easily.  Psychiatric/Behavioral: Negative for dysphoric mood.       Objective:   Physical Exam VS: see vs page GEN: no distress HEAD: head: no deformity eyes: no periorbital swelling, no proptosis external nose and ears are normal mouth: no lesion seen NECK: no palpable thyroid enlargement.  No thyroid nodule is palpable.  No palpable lymphadenopathy at the anterior neck. CHEST WALL: no deformity LUNGS:  Clear to auscultation BREASTS:  sees gyn CV: reg rate and rhythm, no murmur ABD: abdomen is soft, nontender.  no hepatosplenomegaly.  not distended.  no hernia GENITALIA/RECTAL: sees  gyn. MUSCULOSKELETAL: muscle bulk and strength are grossly normal.  no obvious joint swelling.  gait is normal and steady EXTEMITIES: no deformity.  no ulcer on the feet.  feet are of normal color and temp.  no edema PULSES: dorsalis pedis intact bilat.  no carotid bruit NEURO:  cn 2-12 grossly intact.   readily moves all 4's.  sensation is intact to touch on the feet SKIN:  Normal texture and temperature.  No rash or suspicious lesion is visible.   NODES:  None palpable at the neck PSYCH: alert, well-oriented.  Does not appear anxious nor depressed.  i reviewed electrocardiogram    Assessment & Plan:  Wellness visit today, with problems stable, except as noted.

## 2013-08-12 DIAGNOSIS — Z0000 Encounter for general adult medical examination without abnormal findings: Secondary | ICD-10-CM | POA: Insufficient documentation

## 2014-02-16 ENCOUNTER — Other Ambulatory Visit: Payer: Self-pay

## 2014-02-16 DIAGNOSIS — Z1231 Encounter for screening mammogram for malignant neoplasm of breast: Secondary | ICD-10-CM

## 2014-03-09 ENCOUNTER — Ambulatory Visit
Admission: RE | Admit: 2014-03-09 | Discharge: 2014-03-09 | Disposition: A | Discharge status: Home / Self Care | Payer: BC Managed Care – PPO | Source: Ambulatory Visit

## 2014-03-09 DIAGNOSIS — Z1231 Encounter for screening mammogram for malignant neoplasm of breast: Secondary | ICD-10-CM

## 2014-03-09 NOTE — Progress Notes (Signed)
Patient presented for screening mammogram today.  She has itching of the left nipple for 6 months that is constant.  Spoke with Dr Miquel Dunn  To see if the patient needs a screening or diagnostic mammogram.  She stated a diagnostic.  Explained to the patient that any time there is a problem, it is no longer a routine screening.  Patient informed me that she would not return for a diagnostic as she has a high deductible and could not pay it.  Also stated she should not have told us and we could have done her as a screening.  I explained that not all cancers are seen on a mammogram and this type of appointment would give our Dr a chance to examine her and do any additional imaging needed at that time. Explained that the Dr also said she would be called back from her screening for a diagnostic as well.  Patient stated she would not return.

## 2014-03-12 ENCOUNTER — Other Ambulatory Visit: Payer: Self-pay

## 2014-03-12 ENCOUNTER — Other Ambulatory Visit: Payer: Self-pay | Admitting: Gynecology

## 2014-03-12 DIAGNOSIS — R234 Changes in skin texture: Secondary | ICD-10-CM

## 2014-07-03 ENCOUNTER — Telehealth: Payer: Self-pay | Admitting: Endocrinology

## 2014-07-03 ENCOUNTER — Other Ambulatory Visit: Payer: Self-pay

## 2014-07-03 MED ORDER — TRIAMTERENE-HCTZ 37.5-25 MG PO TABS: 1.0000 | ORAL_TABLET | Freq: Every day | ORAL | Status: DC

## 2014-07-03 NOTE — Telephone Encounter (Signed)
Rx sent electronically to [facility]. Pt advised rx has been sent.

## 2014-07-03 NOTE — Telephone Encounter (Signed)
Patient stated that her pharmacy at [facility] fax over a request for her Meds, generic brand for Maxzide 25, 37.5 25 mg they haven't heard anything from our office yet, and she is totally out of her meds, Please advise

## 2014-08-16 ENCOUNTER — Telehealth: Payer: Self-pay | Admitting: Endocrinology

## 2014-08-16 DIAGNOSIS — Z8679 Personal history of other diseases of the circulatory system: Secondary | ICD-10-CM

## 2014-08-16 NOTE — Telephone Encounter (Signed)
See below and please advise, Thanks!  

## 2014-08-16 NOTE — Telephone Encounter (Signed)
done

## 2014-08-16 NOTE — Telephone Encounter (Signed)
Patient would like to be referred to Dr. Antony Contras [facility]  [address] Dr. Leonie Man 2009 no change symptoms are getting worse   Please advise    Thank you

## 2014-08-20 NOTE — Telephone Encounter (Signed)
Pt advised referral had been accidentally sent to [facility]. [facility] contacted to send referral to [place]. [facility] to contact pt once referral is complete. Pt voiced understanding.

## 2014-08-20 NOTE — Telephone Encounter (Signed)
Patient stated that Neuro haven't call her to set up an appointment yet, please advise

## 2014-08-28 ENCOUNTER — Telehealth: Payer: Self-pay

## 2014-08-28 DIAGNOSIS — G589 Mononeuropathy, unspecified: Secondary | ICD-10-CM | POA: Insufficient documentation

## 2014-08-28 NOTE — Telephone Encounter (Signed)
Pt called requesting a referral to [facility] for the pinched nerve in her back. Pt originally wanted a appointment with Dr. Leonie Man but first available was in 3 months. Please advise, Thanks!

## 2014-08-28 NOTE — Telephone Encounter (Signed)
Pt advised referral has been placed. [facility] notified as well.

## 2014-08-28 NOTE — Telephone Encounter (Signed)
Done cpx is due

## 2014-09-17 ENCOUNTER — Other Ambulatory Visit: Payer: Self-pay | Admitting: Orthopedic Surgery

## 2014-09-17 DIAGNOSIS — M545 Low back pain: Secondary | ICD-10-CM

## 2014-09-27 ENCOUNTER — Ambulatory Visit
Admission: RE | Admit: 2014-09-27 | Discharge: 2014-09-27 | Disposition: A | Discharge status: Home / Self Care | Payer: 59 | Source: Ambulatory Visit | Attending: Orthopedic Surgery | Admitting: Orthopedic Surgery

## 2014-09-27 VITALS — BP 129/68 | HR 65

## 2014-09-27 DIAGNOSIS — M545 Low back pain: Secondary | ICD-10-CM

## 2014-09-27 MED ORDER — ONDANSETRON HCL 4 MG/2ML IJ SOLN: 4.0000 mg | Freq: Four times a day (QID) | INTRAMUSCULAR | Status: DC | PRN

## 2014-09-27 MED ORDER — DIAZEPAM 5 MG PO TABS
10.0000 mg | ORAL_TABLET | Freq: Once | ORAL | Status: AC
  Administered 2014-09-27: 10 mg via ORAL

## 2014-09-27 MED ORDER — IOHEXOL 180 MG/ML  SOLN
15.0000 mL | Freq: Once | INTRAMUSCULAR | Status: AC | PRN
  Administered 2014-09-27: 15 mL via INTRATHECAL

## 2014-09-27 NOTE — Discharge Instructions (Signed)

## 2014-10-05 ENCOUNTER — Telehealth: Payer: Self-pay | Admitting: Endocrinology

## 2014-10-05 DIAGNOSIS — G589 Mononeuropathy, unspecified: Secondary | ICD-10-CM

## 2014-10-05 NOTE — Telephone Encounter (Signed)
done

## 2014-10-05 NOTE — Telephone Encounter (Signed)
Per referral notes: Referral # [id number]  start date 08/30/14, exp 02/28/15  good for 6 visits. Faxed to [facility] Sophronia Simas [phone], she will contact pt  Called and spoke with [place] and if pt is going to switch providers another referral is needed.  Please send a referral for  Kelly Wilson.

## 2014-10-05 NOTE — Telephone Encounter (Signed)
Called and spoke with pt and pt is aware referral has been ordered.  Advised pt to allow time for [facility] to work on the referral. Pt verbalized understanding.

## 2014-10-05 NOTE — Telephone Encounter (Signed)
Pt called and needs referral to [facility]  ( Dr. Jacelyn Grip) needs referral ASAP, call pt on cell phone and let know when it has been done [phone]

## 2014-10-05 NOTE — Addendum Note (Signed)
Addended by: [name] on: 10/05/2014 04:12 PM   Modules accepted: Orders

## 2014-10-16 ENCOUNTER — Telehealth: Payer: Self-pay | Admitting: Endocrinology

## 2014-10-16 NOTE — Telephone Encounter (Signed)
Patient stated that she haven't heard anything from the referral, Orthopedic surgery it has been two weeks, please advise

## 2014-10-16 NOTE — Telephone Encounter (Signed)
Contacted pt and advised [facility] is working on getting her a appointment. Pt will be notified by [facility] about appointment as soon as its completed. Pt voiced understanding,

## 2014-11-14 ENCOUNTER — Encounter: Payer: Self-pay | Admitting: Neurology

## 2014-11-14 ENCOUNTER — Ambulatory Visit (INDEPENDENT_AMBULATORY_CARE_PROVIDER_SITE_OTHER): Payer: 59 | Admitting: Neurology

## 2014-11-14 VITALS — BP 176/88 | HR 78 | Ht 66.0 in | Wt 242.0 lb

## 2014-11-14 DIAGNOSIS — M791 Myalgia, unspecified site: Secondary | ICD-10-CM | POA: Insufficient documentation

## 2014-11-14 DIAGNOSIS — R252 Cramp and spasm: Secondary | ICD-10-CM

## 2014-11-14 HISTORY — DX: Cramp and spasm: R25.2

## 2014-11-14 HISTORY — DX: Myalgia, unspecified site: M79.10

## 2014-11-14 MED ORDER — GABAPENTIN 300 MG PO CAPS: 300.0000 mg | ORAL_CAPSULE | Freq: Three times a day (TID) | ORAL | Status: DC

## 2014-11-14 NOTE — Patient Instructions (Signed)
I had a long discussion with the patient with regards to her progressive muscle cramps and twitchings, discussed my examination findings, differential diagnosis and answered questions. I recommend checking complete metabolic panel labs, thyroid function tests, muscle enzymes. Patient is reluctant to undergo EMG nerve conduction study as she states that she has had it done in the past. I would like to review prior neurological records. Trial of gabapentin 300 mg 3 times daily. I discussed side effects with the patient and advised her to call me if necessary.I advised her to maintain adequate hydration and to eat potassium containing foods. Return for follow-up in 2 months or call earlier if necessary  Leg Cramps Leg cramps that occur during exercise can be caused by poor circulation or dehydration. However, muscle cramps that occur at rest or during the night are usually not due to any serious medical problem. Heat cramps may cause muscle spasms during hot weather.  CAUSES There is no clear cause for muscle cramps. However, dehydration may be a factor for those who do not drink enough fluids and those who exercise in the heat. Imbalances in the level of sodium, potassium, calcium or magnesium in the muscle tissue may also be a factor. Some medications, such as water pills (diuretics), may cause loss of chemicals that the body needs (like sodium and potassium) and cause muscle cramps. TREATMENT   Make sure your diet has enough fluids and essential minerals for the muscle to work normally.  Avoid strenuous exercise for several days if you have been having frequent leg cramps.  Stretch and massage the cramped muscle for several minutes.  Some medicines may be helpful in some patients with night cramps. Only take over-the-counter or prescription medicines as directed by your caregiver. SEEK IMMEDIATE MEDICAL CARE IF:   Your leg cramps become worse.  Your foot becomes cold, numb, or blue. Document  Released: 08/13/2004 Document Revised: 09/28/2011 Document Reviewed: 07/31/2008 [facility] Patient Information 2015 [place], [place]. This information is not intended to replace advice given to you by your health care provider. Make sure you discuss any questions you have with your health care provider.

## 2014-11-14 NOTE — Progress Notes (Signed)
[name] [address] [place]. [place] [address] [phone]       OFFICE CONSULT NOTE  [name] Date of Birth:  Sep 29, 1955 Medical Record Number:  [id number]   Referring MD:   [name] Reason for Referral:  Muscle cramps and twitchings  HPI: 59 year old Caucasian lady with chronic muscle cramps and twitchings since several years. She states that these have been slowly progressive and involve all muscles. The twitchings and cramps appear to be more related to exertion and by being active that day. These often involve her hamstrings but can involve the cost as well as shoulder muscles. Application of local heat and stretching of the muscles helps only a little. She has seen her primary physician Dr. Loanne Wilson who has prescribed Robaxin which she takes mainly at night but does not help a lot. Her orthopedic surgeon prescribed a different muscle relaxant which also did not help. She denies tingling numbness or burning in her feet or hands. She does get cramps in her toes and occasionally in the cough and hamstrings. She recently underwent CT myelogram of the lumbar spine on 09/27/14 which I personally reviewed and shows only moderate to severe chronic facet arthropathic changes at T12-L1 and borderline mild spinal stenosis at L4-5 without significant compression. The patient denies any muscle weakness, gait or balance problems. She has had no falls. She denies any difficulty getting up from a chair or from a sitting position. She has been taking ibuprofen 200 mg 3 times daily but this is mostly for back pain. She has had to take increasing doses of magnesium and now takes 750 mg daily which seems to help and if she misses a dose for cramps are worse. She was seen by me in 2009 for left facial and temporal pain and paresthesias. Evaluation for temporal arteritis at that time was unyielding. Patient was tried by me on Topamax. I do not have all of her follow-up visits  notes to review today but patient tells me that she did complain of cramps and aches to me at that time an EMG nerve conduction study was done which was unremarkable but I do not have any of that documented in my records. It may be possible that she may have seen a different neurologist in a different practice but she cannot remember at this time.She has no family history of neuromuscular disorder or similar illness.  ROS:   14 system review of systems is positive for  Muscle twitchings, cramps, aching, joint pain, numbness, ringing in the ears and all other systems negative  PMH:  Past Medical History  Diagnosis Date  . Hyperlipidemia   . Hypertension   . 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 HYPERLIPIDEMIA 05/17/2007  . HYPERTENSION 05/17/2007  . GERD 08/09/2008  . OSTEOARTHRITIS 05/17/2007  . SPINAL STENOSIS 05/17/2007  . TRANSIENT ISCHEMIC ATTACK, HX OF 05/17/2007  . Left ear hearing loss     Mild    Social History:  History   Social History  . Marital Status: Married    Spouse Name: N/A  . Number of Children: N/A  . Years of Education: N/A   Occupational History  . realtor    Social History Main Topics  . Smoking status: Never Smoker   . Smokeless tobacco: Not on file     Comment: Married, she is a Cabin [employer]  . Alcohol Use: No  . Drug Use: No  . Sexual Activity: Not on file   Other Topics Concern  .  Not on file   Social History Narrative   Married, Cabin crew, 2 daughters   Caffeine use: tea daily    Medications:   Current Outpatient Prescriptions on File Prior to Visit  Medication Sig Dispense Refill  . aspirin 81 MG EC tablet Take 81 mg by mouth daily.      [place] Kitchen omeprazole (PRILOSEC) 40 MG capsule Take 40 mg by mouth daily.      [place] Kitchen triamterene-hydrochlorothiazide (MAXZIDE-25) 37.5-25 MG per tablet Take 1 tablet by mouth daily. 90 tablet 1   No current facility-administered medications on file prior to visit.    Allergies:   Allergies  Allergen Reactions  .  Morphine Hives  . Rosuvastatin Other (See Comments)    myalgia    Physical Exam General: obese middle-aged Caucasian lady, seated, in no evident distress Head: head normocephalic and atraumatic.   Neck: supple with no carotid or supraclavicular bruits Cardiovascular: regular rate and rhythm, no murmurs Musculoskeletal: no deformity Skin:  no rash/petichiae. Old surgical scar right knee Vascular:  Normal pulses all extremities  Neurologic Exam Mental Status: Awake and fully alert. Oriented to place and time. Recent and remote memory intact. Attention span, concentration and fund of knowledge appropriate. Mood and affect appropriate.  Cranial Nerves: Fundoscopic exam reveals sharp disc margins. Pupils equal, briskly reactive to light. Extraocular movements full without nystagmus. Visual fields full to confrontation. Hearing intact. Facial sensation intact. Face, tongue, palate moves normally and symmetrically.  Motor: Normal bulk and tone. Normal strength in all tested extremity muscles.No visible fasciculations, myoclonus noted. No muscle wasting. Sensory.: intact to touch , pinprick , position and vibratory sensation.  Coordination: Rapid alternating movements normal in all extremities. Finger-to-nose and heel-to-shin performed accurately bilaterally. Gait and Station: Arises from chair without difficulty. Stance is normal. Gait demonstrates normal stride length and balance . Able to heel, toe and tandem walk without difficulty.  Reflexes: 1+ and symmetric. Toes downgoing.       ASSESSMENT: 13 year Caucasian lady with multiyear history of progressive muscle twitchings and cramps of unclear etiology. Neurological exam is unremarkable.Primary underlying neurological disorder would be less likely    PLAN: I had a long discussion with the patient with regards to her progressive muscle cramps and twitchings, discussed my examination findings, differential diagnosis and answered questions.  I recommend checking complete metabolic panel labs, thyroid function tests, muscle enzymes. Patient is reluctant to undergo EMG nerve conduction study as she states that she has had it done in the past. I would like to review prior neurological records. Trial of gabapentin 300 mg 3 times daily. I discussed side effects with the patient and advised her to call me if necessary.I advised her to maintain adequate hydration and to eat potassium containing foods. Return for follow-up in 2 months or call earlier if necessary  Antony Contras, MD   Note: This document was prepared with digital dictation and possible smart phrase technology. Any transcriptional errors that result from this process are unintentional.

## 2014-11-15 ENCOUNTER — Telehealth: Payer: Self-pay | Admitting: *Deleted

## 2014-11-15 LAB — COMPREHENSIVE METABOLIC PANEL
ALBUMIN: 4.2 g/dL (ref 3.6–4.8)
ALT: 13 IU/L (ref 0–32)
AST: 18 IU/L (ref 0–40)
Albumin/Globulin Ratio: 2 (ref 1.1–2.5)
Alkaline Phosphatase: 69 IU/L (ref 39–117)
BUN/Creatinine Ratio: 22 (ref 11–26)
BUN: 23 mg/dL (ref 8–27)
Bilirubin Total: 0.3 mg/dL (ref 0.0–1.2)
CALCIUM: 9.5 mg/dL (ref 8.7–10.3)
CO2: 24 mmol/L (ref 18–29)
Chloride: 101 mmol/L (ref 97–108)
Creatinine, Ser: 1.03 mg/dL — ABNORMAL HIGH (ref 0.57–1.00)
GFR calc Af Amer: 68 mL/min/{1.73_m2} (ref >59)
GFR, EST NON AFRICAN AMERICAN: 59 mL/min/{1.73_m2} — AB (ref >59)
GLOBULIN, TOTAL: 2.1 g/dL (ref 1.5–4.5)
GLUCOSE: 93 mg/dL (ref 65–99)
Potassium: 4 mmol/L (ref 3.5–5.2)
Sodium: 140 mmol/L (ref 134–144)
TOTAL PROTEIN: 6.3 g/dL (ref 6.0–8.5)

## 2014-11-15 LAB — PTH, INTACT AND CALCIUM: PTH: 26 pg/mL (ref 15–65)

## 2014-11-15 LAB — CK: Total CK: 145 U/L (ref 24–173)

## 2014-11-15 LAB — LACTATE DEHYDROGENASE: LDH: 166 IU/L (ref 119–226)

## 2014-11-15 LAB — TSH: TSH: 3.2 u[IU]/mL (ref 0.450–4.500)

## 2014-11-15 NOTE — Telephone Encounter (Signed)
-----   Message from Garvin Fila, MD sent at 11/15/2014  8:33 AM EDT ----- Inform patient that complete metabolic lab panel is normal

## 2014-11-15 NOTE — Telephone Encounter (Signed)
Spoke with patient and informed her that her complete metabolic panel is normal, pre Dr Leonie Man. She verbalized understanding.

## 2014-11-19 ENCOUNTER — Telehealth: Payer: Self-pay | Admitting: *Deleted

## 2014-11-19 NOTE — Telephone Encounter (Signed)
-----   Message from Garvin Fila, MD sent at 11/16/2014  4:52 PM EDT ----- Mitchell Heir inform patient that complete metabolic panel, TSH and muscle enzyme lab tests are all normal

## 2014-11-19 NOTE — Telephone Encounter (Signed)
Left message re: per Dr Leonie Man,  patient's complete metabolic panel, TSH and muscle enzyme lab tests are all normal. Left this caller's name and number for questions.

## 2014-11-20 ENCOUNTER — Telehealth: Payer: Self-pay | Admitting: Neurology

## 2014-11-20 NOTE — Telephone Encounter (Signed)
Patient returned your call and stated that she accidentally deleted the voicemail before she could listen to it. Please return call.

## 2014-11-20 NOTE — Telephone Encounter (Signed)
Patient called and stated that her Rx. gabapentin (NEURONTIN) 300 MG capsule has been causing a severe headache, ear ache, cough, and mild chest pain. She has stopped taking the medication and would like to speak with someone regarding these issues and possibly changing the dosage to something lower.

## 2014-11-20 NOTE — Telephone Encounter (Signed)
Per patient's request, called her back and left second detailed message re: all her lab results were normal, including, metabolic panel, TSH and muscle enzyme. Left this caller's name and number for questions.

## 2014-11-20 NOTE — Telephone Encounter (Signed)
Per Dr Leonie Man, called patient and left detailed message informing her Dr Leonie Man is lowering dose of Gabapentin to 100 mg. Spoke with Gillermina Phy, [facility] pharmacy and gave her verbal order of new dose order: Gabapentin 100 mg,  three times daily, disp 90 tab, no refills. Will clarify with Dr Leonie Man , re: refills on this new dose. [facility] verbally verified new order.

## 2014-11-20 NOTE — Telephone Encounter (Signed)
Refill only if this dose is effective

## 2014-11-20 NOTE — Telephone Encounter (Signed)
Ok to change to neurontin 100 mg capsule

## 2014-12-03 NOTE — Telephone Encounter (Addendum)
Spoke to patient who states she gradually, over 2 weeks time, increased her Gabapentin to 300 mg tid. She states she is taking it for left leg muscle twitching. She states 2 nights ago she developed "terrible cramps" in her left leg, inner left thigh, outer left calf.  She has been on Gabapentin 300 mg tid x 8 days. She is requesting advice. Informed her Dr Leonie Man is in hospital, will route her concerns/ request to Dr Erlinda Hong. This caller will inform her of his response, recommendations. She verbalized understanding.  Also discussed with her the c/o she had on 11/20/14 re: Gabapentin causing HA, ear ache, cough and mild chest pain. She denies any of these issues today, states she has not experienced them with increased Gabapentin dose.

## 2014-12-03 NOTE — Telephone Encounter (Signed)
Called pt back on 4:42pm but nobody available over the phone. Left VM for her to call back and leave number and best time for me to call her back.   Rosalin Hawking, MD PhD Stroke Neurology 12/03/2014 4:42 PM

## 2014-12-03 NOTE — Telephone Encounter (Signed)
Patient called and stated that the medication is still not helping her. She also wanted to note that she increased the medication to 300 mg a day. She has also been experiencing severe cramps at night. Please call and advise.

## 2014-12-04 NOTE — Telephone Encounter (Signed)
Patient returned call. [phone] patient stated that any time was a good time to call. Please call and advise.

## 2014-12-05 NOTE — Telephone Encounter (Signed)
Called pt back today and she stated that she still has both leg twitching and had 3 consecutive nights with bad cramping. The neurontin did not work for her. She has tried flexeril, robaxin in the past but did not work either. She had EMG in 2009 for which she said it was unremarkable. However, for the last 7 years, the symptoms getting worse. She said EMG procedure was very painful and she is not sure if she wants to repeat it.  I told her that she can either try to increase gabapentin dose to 600mg  tid or change to another medication, I would say baclofen to start with. She seems leaning towards 600mg  tid of gabapentin. Then her cell phone lost signal. I called her back and had to leave VM. I guess her cell phone battery was over. Anyways, I left VM telling her to try gabapentin 600mg  tid for 1-2 weeks and call us back and let us know how that works for her.    Rosalin Hawking, MD PhD Stroke Neurology 12/05/2014 4:10 PM

## 2014-12-31 ENCOUNTER — Other Ambulatory Visit: Payer: Self-pay

## 2014-12-31 MED ORDER — TRIAMTERENE-HCTZ 37.5-25 MG PO TABS: 1.0000 | ORAL_TABLET | Freq: Every day | ORAL | Status: DC

## 2015-01-10 ENCOUNTER — Telehealth: Payer: Self-pay | Admitting: Neurology

## 2015-01-10 NOTE — Telephone Encounter (Addendum)
Spoke with patient who verified she is currently taking Gabapentin 600 mg tid. She states she continues to have cramps mainly in her left inner thigh 3-4 x a week, off and on during the day. She states the worst cramps are at night and last 2-3 hours. She uses a heating pad with some relief.  Otherwise she reports ongoing muscle twitches which she states "I can tolerate, but the cramps I cannot."  Per Dr Phoebe Sharps note on 12/05/14 she may benefit from another medication such as Baclofen. Informed patient that Dr Leonie Man is covering hospital but will route her concerns, c/o to him for response. Verified her pharmacy as [facility] on [address]. Informed her this RN would have reply for her by close of office tomorrow. She verbalized understanding, appreciation.  01/10/15 3:21 pm called patient and informed her of Dr Clydene Fake instructions. She stated she will call pharmacy to see if she can get refill as she does not think she has enough to take additional dose. She will call back if she cannot get refill.

## 2015-01-10 NOTE — Telephone Encounter (Signed)
Try extra half tablet -300 mg at night and if not helpful 600 mg extra dose at night if tolerated

## 2015-01-10 NOTE — Telephone Encounter (Signed)
Patient is calling because the Rx gabapentin (NEURONTIN) 300 MG capsule is not helping. The patient is still having bad cramps in her upper thigh. Is there something else patient can do? Please call to advise. Thank you.

## 2015-01-22 ENCOUNTER — Telehealth: Payer: Self-pay | Admitting: Neurology

## 2015-01-22 DIAGNOSIS — R252 Cramp and spasm: Secondary | ICD-10-CM

## 2015-01-22 NOTE — Telephone Encounter (Signed)
Previous note says: Patient takes Gabapentin 600mg  three times daily Garvin Fila, MD at 01/10/2015 2:56 PM     Status: Signed       Expand All Collapse All   Try extra half tablet -300 mg at night and if not helpful 600 mg extra dose at night if tolerated       I called back to clarify exactly how patient is currently taking this medication.  Got no answer.  Left message.

## 2015-01-22 NOTE — Telephone Encounter (Signed)
I called back again.  Spoke with patient who said she is now taking Gabapentin 300mg  capsules three (900mg ) in am, two (600mg ) in afternoon and three (900mg ) at night.  She would like a new Rx reflecting this dose, as she feels symptoms are now under control.  I will be happy to send Rx if this dose is permissible.  Please advise.  Thank you.

## 2015-01-22 NOTE — Telephone Encounter (Signed)
Patient returned call. Please call and advise.  °

## 2015-01-22 NOTE — Telephone Encounter (Signed)
Pt calling to have refill of gabapentin (NEURONTIN) 300 MG capsule. Please call and advise, pt was notified that it might take 24hrs before Rx is ready [phone]. Pt states that her dosage was increased to 900 MG, her Rx is at 300 MG. She needs her new Rx to reflect the appropriate amount that she should be taking. Does not have enough to take for tomorrow.

## 2015-01-23 MED ORDER — GABAPENTIN 300 MG PO CAPS: ORAL_CAPSULE | ORAL | Status: DC

## 2015-01-23 NOTE — Telephone Encounter (Signed)
Rx has been sent.  I called the patient to advise.  She is aware.

## 2015-01-23 NOTE — Telephone Encounter (Signed)
The patient is calling back and states she is completely out of medication so she does not have any for tonight or tomorrow.  Please call.

## 2015-01-23 NOTE — Telephone Encounter (Signed)
Request was sent to provider for approval of change in dose.  Patient says she is now taking Gabapentin 300mg  three (900mg ) in am, two (600mg ) in afternoon and three (900mg ) at night

## 2015-01-23 NOTE — Telephone Encounter (Signed)
Ok to increase dose as stated if tolerated

## 2015-01-24 ENCOUNTER — Encounter: Payer: Self-pay | Admitting: Endocrinology

## 2015-01-24 ENCOUNTER — Ambulatory Visit (INDEPENDENT_AMBULATORY_CARE_PROVIDER_SITE_OTHER): Payer: 59 | Admitting: Endocrinology

## 2015-01-24 VITALS — BP 138/90 | HR 83 | Temp 97.7°F | Resp 16 | Ht 66.0 in | Wt 246.0 lb

## 2015-01-24 DIAGNOSIS — Z0000 Encounter for general adult medical examination without abnormal findings: Secondary | ICD-10-CM | POA: Diagnosis not present

## 2015-01-24 DIAGNOSIS — Z23 Encounter for immunization: Secondary | ICD-10-CM

## 2015-01-24 DIAGNOSIS — E042 Nontoxic multinodular goiter: Secondary | ICD-10-CM

## 2015-01-24 LAB — CBC WITH DIFFERENTIAL/PLATELET
BASOS PCT: 0.5 % (ref 0.0–3.0)
Basophils Absolute: 0 10*3/uL (ref 0.0–0.1)
EOS PCT: 2.4 % (ref 0.0–5.0)
Eosinophils Absolute: 0.1 10*3/uL (ref 0.0–0.7)
HCT: 38.6 % (ref 36.0–46.0)
Hemoglobin: 13 g/dL (ref 12.0–15.0)
LYMPHS ABS: 1.5 10*3/uL (ref 0.7–4.0)
Lymphocytes Relative: 30 % (ref 12.0–46.0)
MCHC: 33.7 g/dL (ref 30.0–36.0)
MCV: 78 fl (ref 78.0–100.0)
MONO ABS: 0.4 10*3/uL (ref 0.1–1.0)
MONOS PCT: 7.7 % (ref 3.0–12.0)
NEUTROS ABS: 3 10*3/uL (ref 1.4–7.7)
Neutrophils Relative %: 59.4 % (ref 43.0–77.0)
Platelets: 312 10*3/uL (ref 150.0–400.0)
RBC: 4.95 Mil/uL (ref 3.87–5.11)
RDW: 14.4 % (ref 11.5–15.5)
WBC: 5.1 10*3/uL (ref 4.0–10.5)

## 2015-01-24 LAB — BASIC METABOLIC PANEL
BUN: 17 mg/dL (ref 6–23)
CHLORIDE: 104 meq/L (ref 96–112)
CO2: 26 mEq/L (ref 19–32)
Calcium: 9.4 mg/dL (ref 8.4–10.5)
Creatinine, Ser: 1.09 mg/dL (ref 0.40–1.20)
GFR: 54.16 mL/min — ABNORMAL LOW (ref >60.00)
Glucose, Bld: 97 mg/dL (ref 70–99)
POTASSIUM: 3.6 meq/L (ref 3.5–5.1)
Sodium: 138 mEq/L (ref 135–145)

## 2015-01-24 LAB — LIPID PANEL
Cholesterol: 252 mg/dL — ABNORMAL HIGH (ref 0–200)
HDL: 67.3 mg/dL (ref >39.00)
LDL Cholesterol: 153 mg/dL — ABNORMAL HIGH (ref 0–99)
NonHDL: 184.7
Total CHOL/HDL Ratio: 4
Triglycerides: 161 mg/dL — ABNORMAL HIGH (ref 0.0–149.0)
VLDL: 32.2 mg/dL (ref 0.0–40.0)

## 2015-01-24 LAB — HEMOGLOBIN A1C: Hgb A1c MFr Bld: 5 % (ref 4.6–6.5)

## 2015-01-24 LAB — HEPATIC FUNCTION PANEL
ALBUMIN: 4.1 g/dL (ref 3.5–5.2)
ALK PHOS: 61 U/L (ref 39–117)
ALT: 14 U/L (ref 0–35)
AST: 17 U/L (ref 0–37)
BILIRUBIN DIRECT: 0.1 mg/dL (ref 0.0–0.3)
Total Bilirubin: 0.3 mg/dL (ref 0.2–1.2)
Total Protein: 6.7 g/dL (ref 6.0–8.3)

## 2015-01-24 LAB — TSH: TSH: 2.34 u[IU]/mL (ref 0.35–4.50)

## 2015-01-24 NOTE — Progress Notes (Signed)
we discussed code status.  pt requests full code, but would not want to be started or maintained on artificial life-support measures if there was not a reasonable chance of recovery 

## 2015-01-24 NOTE — Progress Notes (Signed)
Subjective:    Patient ID: Kelly Wilson, female    DOB: [birth year], 59 y.o.   MRN: [id number]  HPI Pt is here for regular wellness examination, and is feeling pretty well in general, and says chronic med probs are stable, except as noted below. Past Medical History  Diagnosis Date  . Hyperlipidemia   . Hypertension   . 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 HYPERLIPIDEMIA 05/17/2007  . HYPERTENSION 05/17/2007  . GERD 08/09/2008  . OSTEOARTHRITIS 05/17/2007  . SPINAL STENOSIS 05/17/2007  . TRANSIENT ISCHEMIC ATTACK, HX OF 05/17/2007  . Left ear hearing loss     Mild    Past Surgical History  Procedure Laterality Date  . Abdominal hysterectomy  1995  . Tubal ligation    . External ear surgery      left side x's 2  . Stapedectomy    . Throidectomy    . Orif elbow fracture  02/2011  . Skin graft full thickness arm    . Skin graft full thickness leg    . Cesarean section  1993  . Ankle surgery  2005  . Thyroidectomy, partial Right 1980  . Knee surgery  02/2011    History   Social History  . Marital Status: Married    Spouse Name: N/A  . Number of Children: N/A  . Years of Education: N/A   Occupational History  . realtor    Social History Main Topics  . Smoking status: Never Smoker   . Smokeless tobacco: Not on file     Comment: Married, she is a [employer]  . Alcohol Use: No  . Drug Use: No  . Sexual Activity: Not on file   Other Topics Concern  . Not on file   Social History Narrative   Married, Cabin crew, 2 daughters   Caffeine use: tea daily    Current Outpatient Prescriptions on File Prior to Visit  Medication Sig Dispense Refill  . aspirin 81 MG EC tablet Take 81 mg by mouth daily.      Marland Kitchen gabapentin (NEURONTIN) 300 MG capsule three (900mg ) in am, two (600mg ) in afternoon and three (900mg ) at night 240 capsule 6  . omeprazole (PRILOSEC) 40 MG capsule Take 40 mg by mouth daily.      Marland Kitchen triamterene-hydrochlorothiazide (MAXZIDE-25) 37.5-25 MG per tablet Take  1 tablet by mouth daily. *APPOINTMENT NEEDED FOR FURTHER REFILLS* 30 tablet 0   No current facility-administered medications on file prior to visit.    Allergies  Allergen Reactions  . Morphine Hives  . Rosuvastatin Other (See Comments)    myalgia    Family History  Problem Relation Age of Onset  . Coronary artery disease Mother   . Diabetes Mother   . Coronary artery disease Father   . Diabetes Sister     2 sister with DM  . Cancer Other     uncle had rectal cancer  . Hypertension Other   . Hyperlipidemia Other     BP 138/90 mmHg  Pulse 83  Temp(Src) 97.7 F (36.5 C)  Resp 16  Ht 5\' 6"  (1.676 m)  Wt 246 lb (111.585 kg)  BMI 39.72 kg/m2  SpO2 98%  Review of Systems  Constitutional: Positive for unexpected weight change. Negative for fever.  HENT: Negative for hearing loss.   Eyes: Negative for visual disturbance.  Respiratory: Negative for shortness of breath.   Cardiovascular: Negative for chest pain.  Gastrointestinal: Negative for blood in stool.  Endocrine: Negative for cold intolerance.  Genitourinary: Negative for hematuria.  Musculoskeletal: Positive for back pain.  Skin: Negative for rash.  Allergic/Immunologic: Negative for environmental allergies.  Neurological: Negative for syncope and headaches.  Hematological: Does not bruise/bleed easily.  Psychiatric/Behavioral: Negative for dysphoric mood.       Objective:   Physical Exam VS: see vs page GEN: no distress HEAD: head: no deformity eyes: no periorbital swelling, no proptosis external nose and ears are normal.   mouth: no lesion seen.  NECK: a healed scar is present.  i do not appreciate a nodule in the thyroid or elsewhere in the neck CHEST WALL: no deformity.  LUNGS:  Clear to auscultation.  BREASTS: sees gyn.   CV: reg rate and rhythm, no murmur.   ABD: abdomen is soft, nontender.  no hepatosplenomegaly.  not distended.  no hernia GENITALIA/RECTAL: sees gyn MUSCULOSKELETAL: muscle  bulk and strength are grossly normal.  no obvious joint swelling.  gait is normal and steady EXTEMITIES: no deformity.  no ulcer on the feet.  feet are of normal color and temp.  no edema PULSES: dorsalis pedis intact bilat.  no carotid bruit NEURO:  cn 2-12 grossly intact.   readily moves all 4's.  sensation is intact to touch on the feet SKIN:  Normal texture and temperature.  No rash or suspicious lesion is visible, except for a slight eczematous rash on the feet.   NODES:  None palpable at the neck PSYCH: alert, well-oriented.  Does not appear anxious nor depressed.   i personally reviewed electrocardiogram tracing: normal     Assessment & Plan:  Wellness visit today, with problems stable, except as noted.  Patient is advised the following: Patient Instructions  please consider these measures for your health:  minimize alcohol.  do not use tobacco products.  have a colonoscopy at least every 10 years from age 28.  Women should have an annual mammogram from age 35.  keep firearms safely stored.  always use seat belts.  have working smoke alarms in your home.  see an eye doctor and dentist regularly.  never drive under the influence of alcohol or drugs (including prescription drugs).  those with fair skin should take precautions against the sun. good diet and exercise significantly improves your health.  please let me know if you wish to be referred to a dietician.  high blood sugar is very risky to your health.  you should see an eye doctor and dentist every year.  It is very important to get all recommended vaccinations.  blood tests are requested for you today.  We'll let you know about the results. Please return in 1 year.

## 2015-01-24 NOTE — Patient Instructions (Addendum)
please consider these measures for your health:  minimize alcohol.  do not use tobacco products.  have a colonoscopy at least every 10 years from age 61.  Women should have an annual mammogram from age 49.  keep firearms safely stored.  always use seat belts.  have working smoke alarms in your home.  see an eye doctor and dentist regularly.  never drive under the influence of alcohol or drugs (including prescription drugs).  those with fair skin should take precautions against the sun. good diet and exercise significantly improves your health.  please let me know if you wish to be referred to a dietician.  high blood sugar is very risky to your health.  you should see an eye doctor and dentist every year.  It is very important to get all recommended vaccinations.  blood tests are requested for you today.  We'll let you know about the results. Please return in 1 year.

## 2015-01-28 ENCOUNTER — Telehealth: Payer: Self-pay | Admitting: Endocrinology

## 2015-01-28 MED ORDER — TRIAMTERENE-HCTZ 37.5-25 MG PO TABS: 1.0000 | ORAL_TABLET | Freq: Every day | ORAL | Status: DC

## 2015-01-28 NOTE — Telephone Encounter (Signed)
Pt needs 90 day supply called into [facility] please for the maxide generic

## 2015-01-28 NOTE — Telephone Encounter (Signed)
Rx sent per pt's request.  

## 2015-02-04 ENCOUNTER — Encounter: Payer: Self-pay | Admitting: Neurology

## 2015-02-04 ENCOUNTER — Ambulatory Visit (INDEPENDENT_AMBULATORY_CARE_PROVIDER_SITE_OTHER): Payer: 59 | Admitting: Neurology

## 2015-02-04 VITALS — BP 138/86 | HR 76 | Ht 66.0 in | Wt 244.8 lb

## 2015-02-04 DIAGNOSIS — H02403 Unspecified ptosis of bilateral eyelids: Secondary | ICD-10-CM | POA: Diagnosis not present

## 2015-02-04 MED ORDER — GABAPENTIN (ONCE-DAILY) 600 MG PO TABS: 1800.0000 mg | ORAL_TABLET | Freq: Every evening | ORAL | Status: DC

## 2015-02-04 NOTE — Patient Instructions (Signed)
I had a long discussion with the patient with regards to her muscle cramps and myalgias which seem to have responded to gabapentin. She is however taking it 3 times a day total of 8 tablets a day. I would like to try to change her from gabapentin generally to Gralise which is long-acting so that she can take it less often. I will give her a sample pack with instructions. Plan to check nerve conduction study with rapid repetition to evaluate for myasthenia as well as a sabbatical intercept antibodies. She was advised to call me if she has onset of any diplopia or muscle weakness. Return for follow-up in 3 months with Ward Givens, NP or call earlier if necessary.

## 2015-02-04 NOTE — Progress Notes (Signed)
[name] [address] [place]. [place] [address] [phone]       OFFICE FOLLOW UP VISIT  NOTE  Ms. Charlott Rakes Date of Birth:  1956/02/06 Medical Record Number:  [id number]   Referring MD:   [name] Reason for Referral:  Muscle cramps and twitchings  HPI:  Initial Consult 11/14/2014 : 59 year old Caucasian lady with chronic muscle cramps and twitchings since several years. She states that these have been slowly progressive and involve all muscles. The twitchings and cramps appear to be more related to exertion and by being active that day. These often involve her hamstrings but can involve the cost as well as shoulder muscles. Application of local heat and stretching of the muscles helps only a little. She has seen her primary physician [name] who has prescribed Robaxin which she takes mainly at night but does not help a lot. Her orthopedic surgeon prescribed a different muscle relaxant which also did not help. She denies tingling numbness or burning in her feet or hands. She does get cramps in her toes and occasionally in the cough and hamstrings. She recently underwent CT myelogram of the lumbar spine on 09/27/14 which I personally reviewed and shows only moderate to severe chronic facet arthropathic changes at T12-L1 and borderline mild spinal stenosis at L4-5 without significant compression. The patient denies any muscle weakness, gait or balance problems. She has had no falls. She denies any difficulty getting up from a chair or from a sitting position. She has been taking ibuprofen 200 mg 3 times daily but this is mostly for back pain. She has had to take increasing doses of magnesium and now takes 750 mg daily which seems to help and if she misses a dose for cramps are worse. She was seen by me in 2009 for left facial and temporal pain and paresthesias. Evaluation for temporal arteritis at that time was unyielding. Patient was tried by me on Topamax. I do  not have all of her follow-up visits notes to review today but patient tells me that she did complain of cramps and aches to me at that time an EMG nerve conduction study was done which was unremarkable but I do not have any of that documented in my records. It may be possible that she may have seen a different neurologist in a different practice but she cannot remember at this time.She has no family history of neuromuscular disorder or similar illness. Update 02/04/2015 : She returns for follow-up after last visit 3 months ago. She has noticed improvement in her cramps and myalgias since aching gabapentin but she has had to increase the dose of 900 mg morning, 600 in the afternoon and 900 at night which amounts to 8 tablets a day. She would prefer to have a longer acting medicine if possible. She did undergo lab work on 01/24/15 which showed normal TSH, muscle enzymes and LDH. She in fact has gone several days now without cramps. She has also noticed slight droopiness of her eyelids mainly the left eye over the last few months. She denies any diurnal fluctuation, fatigability, weakness or double vision. ROS:   14 system review of systems is positive for  Muscle twitchings, cramps, aching, joint pain, numbness, ringing in the ears and all other systems negative  PMH:  Past Medical History  Diagnosis Date  . Hyperlipidemia   . Hypertension   . 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 HYPERLIPIDEMIA 05/17/2007  . HYPERTENSION 05/17/2007  . GERD 08/09/2008  .  OSTEOARTHRITIS 05/17/2007  . SPINAL STENOSIS 05/17/2007  . TRANSIENT ISCHEMIC ATTACK, HX OF 05/17/2007  . Left ear hearing loss     Mild    Social History:  History   Social History  . Marital Status: Married    Spouse Name: N/A  . Number of Children: N/A  . Years of Education: N/A   Occupational History  . realtor    Social History Main Topics  . Smoking status: Never Smoker   . Smokeless tobacco: Not on file     Comment: Married, she is  a Cabin crew  . Alcohol Use: No  . Drug Use: No  . Sexual Activity: Not on file   Other Topics Concern  . Not on file   Social History Narrative   Married, Cabin crew, 2 daughters   Caffeine use: tea daily    Medications:   Current Outpatient Prescriptions on File Prior to Visit  Medication Sig Dispense Refill  . aspirin 81 MG EC tablet Take 81 mg by mouth daily.      Marland Kitchen gabapentin (NEURONTIN) 300 MG capsule three (900mg ) in am, two (600mg ) in afternoon and three (900mg ) at night 240 capsule 6  . omeprazole (PRILOSEC) 40 MG capsule Take 40 mg by mouth daily.      Marland Kitchen triamterene-hydrochlorothiazide (MAXZIDE-25) 37.5-25 MG per tablet Take 1 tablet by mouth daily. 90 tablet 3   No current facility-administered medications on file prior to visit.    Allergies:   Allergies  Allergen Reactions  . Morphine Hives  . Rosuvastatin Other (See Comments)    myalgia    Physical Exam General: obese middle-aged Caucasian lady, seated, in no evident distress Head: head normocephalic and atraumatic.   Neck: supple with no carotid or supraclavicular bruits Cardiovascular: regular rate and rhythm, no murmurs Musculoskeletal: no deformity Skin:  no rash/petichiae. Old surgical scar right knee Vascular:  Normal pulses all extremities  Neurologic Exam Mental Status: Awake and fully alert. Oriented to place and time. Recent and remote memory intact. Attention span, concentration and fund of knowledge appropriate. Mood and affect appropriate.  Cranial Nerves: Fundoscopic exam not done.. Pupils equal, briskly reactive to light. Extraocular movements full without nystagmus mild ptosis of the left eye at rest with increase ptosis bilaterally left more than right with sustained upgaze for 1 minute. No diplopia.. Visual fields full to confrontation. Hearing intact. Facial sensation intact. Face, tongue, palate moves normally and symmetrically.  Motor: Normal bulk and tone. Normal strength in all tested  extremity muscles.No visible fasciculations, myoclonus noted. No muscle wasting. Sensory.: intact to touch , pinprick , position and vibratory sensation.  Coordination: Rapid alternating movements normal in all extremities. Finger-to-nose and heel-to-shin performed accurately bilaterally. Gait and Station: Arises from chair without difficulty. Stance is normal. Gait demonstrates normal stride length and balance . Able to heel, toe and tandem walk without difficulty.  Reflexes: 1+ and symmetric. Toes downgoing.       ASSESSMENT: 37 year Caucasian lady with multiyear history of progressive muscle twitchings and cramps of unclear etiology. Neurological exam is unremarkable except for mild resting ptosis increase with sustained upgaze but no other complaints symptoms for myasthenia.    PLAN:  I had a long discussion with the patient with regards to her muscle cramps and myalgias which seem to have responded to gabapentin. She is however taking it 3 times a day total of 8 tablets a day. I would like to try to change her from gabapentin generally to Gralise which is long-acting  so that she can take it less often. Begin 600 mg at night once daily increase if tolerated to 1200 or 1800 mg. I will give her a sample pack with instructions. Plan to check nerve conduction study with rapid repetition to evaluate for myasthenia as well as a sabbatical intercept antibodies. She was advised to call me if she has onset of any diplopia or muscle weakness. Return for follow-up in 3 months with Ward Givens, NP or call earlier if necessary.  Antony Contras, MD   Note: This document was prepared with digital dictation and possible smart phrase technology. Any transcriptional errors that result from this process are unintentional.

## 2015-02-21 ENCOUNTER — Ambulatory Visit (INDEPENDENT_AMBULATORY_CARE_PROVIDER_SITE_OTHER): Payer: Self-pay | Admitting: Diagnostic Neuroimaging

## 2015-02-21 ENCOUNTER — Ambulatory Visit (INDEPENDENT_AMBULATORY_CARE_PROVIDER_SITE_OTHER): Payer: 59 | Admitting: Diagnostic Neuroimaging

## 2015-02-21 DIAGNOSIS — R252 Cramp and spasm: Secondary | ICD-10-CM | POA: Diagnosis not present

## 2015-02-21 DIAGNOSIS — Z0289 Encounter for other administrative examinations: Secondary | ICD-10-CM

## 2015-02-21 DIAGNOSIS — H02403 Unspecified ptosis of bilateral eyelids: Secondary | ICD-10-CM

## 2015-02-22 NOTE — Procedures (Signed)
   [name]  NCS (NERVE CONDUCTION STUDY) WITH EMG (ELECTROMYOGRAPHY) REPORT   STUDY DATE: 02/20/15 PATIENT NAME: Kelly Wilson DOB: Jun 30, 1956 MRN: [id number]  ORDERING CLINICIAN: Antony Contras, MD   TECHNOLOGIST: Laretta Alstrom  ELECTROMYOGRAPHER: Earlean Polka. , MD  CLINICAL INFORMATION: 59 year old female with muscle cramps and ptosis.  FINDINGS: NERVE CONDUCTION STUDY: Left median motor response has prolonged distal latency 6.63ms, decreased amplitude, normal conduction velocity and normal F-wave latency.  Left ulnar, peroneal, tibial motor responses and F wave latencies are normal.  Left median sensory response has decreased amplitude and slow conduction velocity. Left ulnar and left peroneal sensory responses are normal.  Repetitive nerve stimulation of left median nerve, at baseline and after exertion at 15 seconds, 30 seconds, 1 minute, 2 minutes, 3 minutes, 4 minutes is normal. No evidence of significant decremental or incremental response.   NEEDLE ELECTROMYOGRAPHY: Needle examination of left upper extremity deltoid, biceps, triceps, flexor carpi radialis, first dorsal interosseous, left C5-6 and C7-T1 paraspinal muscles is normal.   IMPRESSION:  Abnormal study demonstrate: 1. 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  2. No underlying evidence of widespread large fiber neuropathy or neuromuscular junction disorder at this time.     INTERPRETING PHYSICIAN:  Penni Bombard, MD Certified in Neurology, Neurophysiology and Neuroimaging  [facility] [address] [place], [place] [address] [phone]

## 2015-03-03 LAB — ACETYLCHOLINE RECEPTOR AB, ALL
AChR Binding Ab, Serum: <0.03 nmol/L (ref 0.00–0.24)
Acetylchol Block Ab: 12 % (ref 0–25)
Acetylcholine Modulat Ab: <12 % (ref 0–20)

## 2015-03-04 ENCOUNTER — Telehealth: Payer: Self-pay

## 2015-03-04 NOTE — Telephone Encounter (Signed)
Rn notified patient that antibody test for myasthenia gravis were all negative. Patient understands the test results.

## 2015-05-07 ENCOUNTER — Ambulatory Visit (INDEPENDENT_AMBULATORY_CARE_PROVIDER_SITE_OTHER): Payer: 59 | Admitting: Adult Health

## 2015-05-07 ENCOUNTER — Encounter: Payer: Self-pay | Admitting: Adult Health

## 2015-05-07 VITALS — BP 157/77 | HR 76 | Ht 66.0 in | Wt 248.0 lb

## 2015-05-07 DIAGNOSIS — H02403 Unspecified ptosis of bilateral eyelids: Secondary | ICD-10-CM | POA: Diagnosis not present

## 2015-05-07 DIAGNOSIS — R252 Cramp and spasm: Secondary | ICD-10-CM

## 2015-05-07 NOTE — Patient Instructions (Signed)
Continue Gabapentin If your symptoms worsen or you develop new symptoms please let us know.  Consider sleep study

## 2015-05-07 NOTE — Progress Notes (Signed)
PATIENT: Kelly Wilson DOB: February 17, 1956  REASON FOR VISIT: follow up- muscle cramps and myalgia HISTORY FROM: patient  HISTORY OF PRESENT ILLNESS: Kelly Wilson is a 59 year old female with a history of chronic muscle cramps and myalgia. She returns today for follow-up. The patient has been taking gabapentin 3 times a day. At the last visit she was tried on long-acting gabapentin. She reports that her insurance does not cover this medication so she did not start it. She reports that the gabapentin does help with her muscle cramps and twitching. She reports more recently she has more of a jerking sensation in the legs. She has found that she does certain stretching this does relieve the jerking and cramps. The patient has had a nerve conduction study with EMG that was unremarkable. She is also had a CT myelogram of the lumbar spine that didn't show any significant changes. At the last visit she was also complaining of the ptosis. She had blood work and nerve conduction studies to evaluate for myasthenia gravis however all tests were negative. She states that she will continue to have the ptosis but denies any double vision. Denies any weakness in the extremities. Denies any weakness in the jaw while eating. Denies any trouble swallowing. She returns today for an evaluation.  HISTORY   (SETHI): Initial Consult 11/14/2014 : 59 year old Caucasian lady with chronic muscle cramps and twitchings since several years. She states that these have been slowly progressive and involve all muscles. The twitchings and cramps appear to be more related to exertion and by being active that day. These often involve her hamstrings but can involve the cost as well as shoulder muscles. Application of local heat and stretching of the muscles helps only a little. She has seen her primary physician [name] who has prescribed Robaxin which she takes mainly at night but does not help a lot. Her orthopedic surgeon  prescribed a different muscle relaxant which also did not help. She denies tingling numbness or burning in her feet or hands. She does get cramps in her toes and occasionally in the cough and hamstrings. She recently underwent CT myelogram of the lumbar spine on 09/27/14 which I personally reviewed and shows only moderate to severe chronic facet arthropathic changes at T12-L1 and borderline mild spinal stenosis at L4-5 without significant compression. The patient denies any muscle weakness, gait or balance problems. She has had no falls. She denies any difficulty getting up from a chair or from a sitting position. She has been taking ibuprofen 200 mg 3 times daily but this is mostly for back pain. She has had to take increasing doses of magnesium and now takes 750 mg daily which seems to help and if she misses a dose for cramps are worse. She was seen by me in 2009 for left facial and temporal pain and paresthesias. Evaluation for temporal arteritis at that time was unyielding. Patient was tried by me on Topamax. I do not have all of her follow-up visits notes to review today but patient tells me that she did complain of cramps and aches to me at that time an EMG nerve conduction study was done which was unremarkable but I do not have any of that documented in my records. It may be possible that she may have seen a different neurologist in a different practice but she cannot remember at this time.She has no family history of neuromuscular disorder or similar illness. Update 02/04/2015 : She returns for follow-up after last  visit 3 months ago. She has noticed improvement in her cramps and myalgias since aching gabapentin but she has had to increase the dose of 900 mg morning, 600 in the afternoon and 900 at night which amounts to 8 tablets a day. She would prefer to have a longer acting medicine if possible. She did undergo lab work on 01/24/15 which showed normal TSH, muscle enzymes and LDH. She in fact has gone  several days now without cramps. She has also noticed slight droopiness of her eyelids mainly the left eye over the last few months. She denies any diurnal fluctuation, fatigability, weakness or double vision  REVIEW OF SYSTEMS: Out of a complete 14 system review of symptoms, the patient complains only of the following symptoms, and all other reviewed systems are negative.  Joint pain, back pain, aching muscles, muscle cramps, neck pain  ALLERGIES: Allergies  Allergen Reactions  . Morphine Hives  . Rosuvastatin Other (See Comments)    myalgia    HOME MEDICATIONS: Outpatient Prescriptions Prior to Visit  Medication Sig Dispense Refill  . gabapentin (NEURONTIN) 300 MG capsule three (900mg ) in am, two (600mg ) in afternoon and three (900mg ) at night 240 capsule 6  . omeprazole (PRILOSEC) 40 MG capsule Take 40 mg by mouth daily.      Marland Kitchen triamterene-hydrochlorothiazide (MAXZIDE-25) 37.5-25 MG per tablet Take 1 tablet by mouth daily. 90 tablet 3  . aspirin 81 MG EC tablet Take 81 mg by mouth daily.      . Gabapentin, Once-Daily, (GRALISE) 600 MG TABS Take 1,800 mg by mouth Nightly. 90 tablet 1  . triamterene-hydrochlorothiazide (DYAZIDE) 37.5-25 MG per capsule Take 1 capsule by mouth daily.     No facility-administered medications prior to visit.    PAST MEDICAL HISTORY: Past Medical History  Diagnosis Date  . Hyperlipidemia   . Hypertension   . 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 HYPERLIPIDEMIA 05/17/2007  . HYPERTENSION 05/17/2007  . GERD 08/09/2008  . OSTEOARTHRITIS 05/17/2007  . SPINAL STENOSIS 05/17/2007  . TRANSIENT ISCHEMIC ATTACK, HX OF 05/17/2007  . Left ear hearing loss     Mild    PAST SURGICAL HISTORY: Past Surgical History  Procedure Laterality Date  . Abdominal hysterectomy  1995  . Tubal ligation    . External ear surgery      left side x's 2  . Stapedectomy    . Throidectomy    . Orif elbow fracture  02/2011  . Skin graft full thickness arm    . Skin graft  full thickness leg    . Cesarean section  1993  . Ankle surgery  2005  . Thyroidectomy, partial Right 1980  . Knee surgery  02/2011    FAMILY HISTORY: Family History  Problem Relation Age of Onset  . Coronary artery disease Mother   . Diabetes Mother   . Coronary artery disease Father   . Diabetes Sister     2 sister with DM  . Cancer Other     uncle had rectal cancer  . Hypertension Other   . Hyperlipidemia Other     SOCIAL HISTORY: Social History   Social History  . Marital Status: Married    Spouse Name: N/A  . Number of Children: N/A  . Years of Education: N/A   Occupational History  . realtor    Social History Main Topics  . Smoking status: Never Smoker   . Smokeless tobacco: Not on file     Comment: Married, she is a Cabin crew  .  Alcohol Use: No  . Drug Use: No  . Sexual Activity: Not on file   Other Topics Concern  . Not on file   Social History Narrative   Married, Cabin crew, 2 daughters   Caffeine use: tea daily      PHYSICAL EXAM  Filed Vitals:   05/07/15 0844  BP: 157/77  Pulse: 76  Height: 5\' 6"  (1.676 m)  Weight: 248 lb (112.492 kg)   Body mass index is 40.05 kg/(m^2).  Generalized: Well developed, in no acute distress   Neurological examination  Mentation: Alert oriented to time, place, history taking. Follows all commands speech and language fluent Cranial nerve II-XII: Pupils were equal round reactive to light. Extraocular movements were full, visual field were full on confrontational test. Facial sensation and strength were normal. Uvula tongue midline. Head turning and shoulder shrug  were normal and symmetric. Motor: The motor testing reveals 5 over 5 strength of all 4 extremities. Good symmetric motor tone is noted throughout.  Sensory: Sensory testing is intact to soft touch on all 4 extremities. No evidence of extinction is noted.  Coordination: Cerebellar testing reveals good finger-nose-finger and heel-to-shin bilaterally.  Gait  and station: Gait is normal. Tandem gait is normal. Romberg is negative. No drift is seen. Able to walk on the toes and heels without difficulty. Reflexes: Deep tendon reflexes are symmetric and normal bilaterally.   DIAGNOSTIC DATA (LABS, IMAGING, TESTING) - I reviewed patient records, labs, notes, testing and imaging myself where available.  Lab Results  Component Value Date   WBC 5.1 01/24/2015   HGB 13.0 01/24/2015   HCT 38.6 01/24/2015   MCV 78.0 01/24/2015   PLT 312.0 01/24/2015      Component Value Date/Time   NA 138 01/24/2015 1510   NA 140 11/14/2014 1005   K 3.6 01/24/2015 1510   CL 104 01/24/2015 1510   CO2 26 01/24/2015 1510   GLUCOSE 97 01/24/2015 1510   GLUCOSE 93 11/14/2014 1005   BUN 17 01/24/2015 1510   BUN 23 11/14/2014 1005   CREATININE 1.09 01/24/2015 1510   CREATININE 0.91 08/03/2013 0754   CALCIUM 9.4 01/24/2015 1510   PROT 6.7 01/24/2015 1510   PROT 6.3 11/14/2014 1005   ALBUMIN 4.1 01/24/2015 1510   ALBUMIN 4.2 11/14/2014 1005   AST 17 01/24/2015 1510   ALT 14 01/24/2015 1510   ALKPHOS 61 01/24/2015 1510   BILITOT 0.3 01/24/2015 1510   BILITOT 0.3 11/14/2014 1005   GFRNONAA 59* 11/14/2014 1005   GFRNONAA 69 08/03/2013 0754   GFRAA 68 11/14/2014 1005   GFRAA 80 08/03/2013 0754   Lab Results  Component Value Date   CHOL 252* 01/24/2015   HDL 67.30 01/24/2015   LDLCALC 153* 01/24/2015   LDLDIRECT 171.2 08/03/2013   TRIG 161.0* 01/24/2015   CHOLHDL 4 01/24/2015   Lab Results  Component Value Date   HGBA1C 5.0 01/24/2015    Lab Results  Component Value Date   TSH 2.34 01/24/2015      ASSESSMENT AND PLAN 59 y.o. year old female  has a past medical history of Hyperlipidemia; Hypertension; GOITER, MULTINODULAR (05/28/2008); HYPERLIPIDEMIA (05/17/2007); HYPERTENSION (05/17/2007); GERD (08/09/2008); OSTEOARTHRITIS (05/17/2007); SPINAL STENOSIS (05/17/2007); TRANSIENT ISCHEMIC ATTACK, HX OF (05/17/2007); and Left ear hearing loss. here  with:  1. Muscle cramps and myalgia 2. Ptosis  Overall the patient has remained stable. She continues to have muscle cramps and jerking in the lower extremities. She reports the gabapentin works well for her. She will continue  gabapentin at this time. I have advised the patient that if her symptoms worsen or she develops new symptoms she should let us know. Also provided the patient with a handout of back exercises that she may find beneficial. The patient continues to have ptosis but no worsening and she has not developed any additional symptoms. She will follow-up in 6 months or sooner if needed.  Ward Givens, MSN, NP-C 05/07/2015, 8:56 AM [facility] [address] [place], [place] [address] [phone]

## 2015-05-08 NOTE — Progress Notes (Signed)
I reviewed note and agree with plan.    R. , MD 05/08/2015, 8:38 PM Certified in Neurology, Neurophysiology and Neuroimaging  [name] [address], [address] [place], [place] [address] [phone]  

## 2015-05-10 ENCOUNTER — Observation Stay (HOSPITAL_BASED_OUTPATIENT_CLINIC_OR_DEPARTMENT_OTHER): Payer: 59

## 2015-05-10 ENCOUNTER — Encounter (HOSPITAL_COMMUNITY): Payer: Self-pay | Admitting: Emergency Medicine

## 2015-05-10 ENCOUNTER — Observation Stay (HOSPITAL_COMMUNITY)
Admission: EM | Admit: 2015-05-10 | Discharge: 2015-05-10 | Disposition: A | Discharge status: Home / Self Care | Payer: 59 | Attending: Internal Medicine | Admitting: Internal Medicine

## 2015-05-10 ENCOUNTER — Emergency Department (HOSPITAL_COMMUNITY): Payer: 59

## 2015-05-10 DIAGNOSIS — Z79899 Other long term (current) drug therapy: Secondary | ICD-10-CM | POA: Diagnosis not present

## 2015-05-10 DIAGNOSIS — R0789 Other chest pain: Secondary | ICD-10-CM

## 2015-05-10 DIAGNOSIS — I129 Hypertensive chronic kidney disease with stage 1 through stage 4 chronic kidney disease, or unspecified chronic kidney disease: Secondary | ICD-10-CM | POA: Insufficient documentation

## 2015-05-10 DIAGNOSIS — R079 Chest pain, unspecified: Secondary | ICD-10-CM | POA: Diagnosis not present

## 2015-05-10 DIAGNOSIS — I10 Essential (primary) hypertension: Secondary | ICD-10-CM | POA: Diagnosis not present

## 2015-05-10 DIAGNOSIS — R072 Precordial pain: Secondary | ICD-10-CM | POA: Diagnosis not present

## 2015-05-10 DIAGNOSIS — R252 Cramp and spasm: Secondary | ICD-10-CM

## 2015-05-10 DIAGNOSIS — N182 Chronic kidney disease, stage 2 (mild): Secondary | ICD-10-CM | POA: Diagnosis not present

## 2015-05-10 DIAGNOSIS — E785 Hyperlipidemia, unspecified: Secondary | ICD-10-CM | POA: Diagnosis not present

## 2015-05-10 DIAGNOSIS — I509 Heart failure, unspecified: Secondary | ICD-10-CM | POA: Diagnosis not present

## 2015-05-10 DIAGNOSIS — Z7982 Long term (current) use of aspirin: Secondary | ICD-10-CM | POA: Diagnosis not present

## 2015-05-10 DIAGNOSIS — Z8673 Personal history of transient ischemic attack (TIA), and cerebral infarction without residual deficits: Secondary | ICD-10-CM | POA: Diagnosis not present

## 2015-05-10 LAB — CREATININE, SERUM
Creatinine, Ser: 1.08 mg/dL — ABNORMAL HIGH (ref 0.44–1.00)
GFR calc non Af Amer: 54 mL/min — ABNORMAL LOW (ref >60)

## 2015-05-10 LAB — CBC
HCT: 39.6 % (ref 36.0–46.0)
HCT: 35.1 % — ABNORMAL LOW (ref 36.0–46.0)
HEMOGLOBIN: 12.7 g/dL (ref 12.0–15.0)
Hemoglobin: 11.2 g/dL — ABNORMAL LOW (ref 12.0–15.0)
MCH: 24.7 pg — ABNORMAL LOW (ref 26.0–34.0)
MCH: 24.5 pg — ABNORMAL LOW (ref 26.0–34.0)
MCHC: 32.1 g/dL (ref 30.0–36.0)
MCHC: 31.9 g/dL (ref 30.0–36.0)
MCV: 77 fL — ABNORMAL LOW (ref 78.0–100.0)
MCV: 76.8 fL — AB (ref 78.0–100.0)
PLATELETS: 290 10*3/uL (ref 150–400)
Platelets: 344 10*3/uL (ref 150–400)
RBC: 5.14 MIL/uL — ABNORMAL HIGH (ref 3.87–5.11)
RBC: 4.57 MIL/uL (ref 3.87–5.11)
RDW: 15.2 % (ref 11.5–15.5)
RDW: 15.4 % (ref 11.5–15.5)
WBC: 6.2 10*3/uL (ref 4.0–10.5)
WBC: 4.5 10*3/uL (ref 4.0–10.5)

## 2015-05-10 LAB — LIPID PANEL
CHOL/HDL RATIO: 3.5 ratio
Cholesterol: 263 mg/dL — ABNORMAL HIGH (ref 0–200)
HDL: 76 mg/dL (ref >40)
LDL Cholesterol: 170 mg/dL — ABNORMAL HIGH (ref 0–99)
Triglycerides: 83 mg/dL (ref <150)
VLDL: 17 mg/dL (ref 0–40)

## 2015-05-10 LAB — I-STAT TROPONIN, ED: TROPONIN I, POC: 0 ng/mL (ref 0.00–0.08)

## 2015-05-10 LAB — BASIC METABOLIC PANEL
ANION GAP: 10 (ref 5–15)
BUN: 17 mg/dL (ref 6–20)
CALCIUM: 9.7 mg/dL (ref 8.9–10.3)
CO2: 28 mmol/L (ref 22–32)
Chloride: 102 mmol/L (ref 101–111)
Creatinine, Ser: 1.02 mg/dL — ABNORMAL HIGH (ref 0.44–1.00)
GFR, EST NON AFRICAN AMERICAN: 58 mL/min — AB (ref >60)
GLUCOSE: 113 mg/dL — AB (ref 65–99)
Potassium: 3.4 mmol/L — ABNORMAL LOW (ref 3.5–5.1)
SODIUM: 140 mmol/L (ref 135–145)

## 2015-05-10 LAB — TROPONIN I
Troponin I: <0.03 ng/mL (ref <0.031)
Troponin I: <0.03 ng/mL (ref <0.031)

## 2015-05-10 MED ORDER — GABAPENTIN 300 MG PO CAPS
900.0000 mg | ORAL_CAPSULE | Freq: Two times a day (BID) | ORAL | Status: DC
  Administered 2015-05-10: 900 mg via ORAL

## 2015-05-10 MED ORDER — ONDANSETRON 4 MG PO TBDP
ORAL_TABLET | ORAL | Status: AC
  Filled 2015-05-10: qty 2

## 2015-05-10 MED ORDER — NITROGLYCERIN 0.4 MG SL SUBL
0.4000 mg | SUBLINGUAL_TABLET | SUBLINGUAL | Status: AC | PRN
  Administered 2015-05-10 (×3): 0.4 mg via SUBLINGUAL
  Filled 2015-05-10: qty 1

## 2015-05-10 MED ORDER — ONDANSETRON 4 MG PO TBDP
8.0000 mg | ORAL_TABLET | Freq: Once | ORAL | Status: AC
  Administered 2015-05-10: 8 mg via ORAL

## 2015-05-10 MED ORDER — NITROGLYCERIN 0.4 MG SL SUBL: 0.4000 mg | SUBLINGUAL_TABLET | SUBLINGUAL | Status: DC | PRN

## 2015-05-10 MED ORDER — MAGNESIUM GLUCONATE 500 MG PO TABS
500.0000 mg | ORAL_TABLET | Freq: Every day | ORAL | Status: DC
  Administered 2015-05-10: 500 mg via ORAL
  Filled 2015-05-10 (×2): qty 1

## 2015-05-10 MED ORDER — MAGNESIUM GLUCONATE 500 MG PO TABS: 500.0000 mg | ORAL_TABLET | Freq: Every day | ORAL | Status: DC

## 2015-05-10 MED ORDER — ACETAMINOPHEN 325 MG PO TABS: 650.0000 mg | ORAL_TABLET | ORAL | Status: DC | PRN

## 2015-05-10 MED ORDER — ENOXAPARIN SODIUM 40 MG/0.4ML ~~LOC~~ SOLN
40.0000 mg | SUBCUTANEOUS | Status: DC
  Administered 2015-05-10: 40 mg via SUBCUTANEOUS
  Filled 2015-05-10: qty 0.4

## 2015-05-10 MED ORDER — ONDANSETRON HCL 4 MG/2ML IJ SOLN: 4.0000 mg | Freq: Four times a day (QID) | INTRAMUSCULAR | Status: DC | PRN

## 2015-05-10 MED ORDER — PANTOPRAZOLE SODIUM 40 MG PO TBEC
40.0000 mg | DELAYED_RELEASE_TABLET | Freq: Every day | ORAL | Status: DC
  Administered 2015-05-10: 40 mg via ORAL
  Filled 2015-05-10: qty 1

## 2015-05-10 MED ORDER — GABAPENTIN 300 MG PO CAPS: 600.0000 mg | ORAL_CAPSULE | Freq: Three times a day (TID) | ORAL | Status: DC

## 2015-05-10 MED ORDER — TRIAMTERENE-HCTZ 37.5-25 MG PO TABS
1.0000 | ORAL_TABLET | Freq: Every day | ORAL | Status: DC
  Administered 2015-05-10: 1 via ORAL
  Filled 2015-05-10: qty 1

## 2015-05-10 MED ORDER — GABAPENTIN 300 MG PO CAPS
600.0000 mg | ORAL_CAPSULE | Freq: Every day | ORAL | Status: DC
  Administered 2015-05-10: 600 mg via ORAL
  Filled 2015-05-10: qty 2

## 2015-05-10 MED ORDER — ASPIRIN EC 325 MG PO TBEC
325.0000 mg | DELAYED_RELEASE_TABLET | Freq: Every day | ORAL | Status: DC
  Administered 2015-05-10: 325 mg via ORAL
  Filled 2015-05-10: qty 1

## 2015-05-10 NOTE — Progress Notes (Signed)
Echocardiogram 2D Echocardiogram has been performed.  Kelly Wilson 05/10/2015, 4:20 PM

## 2015-05-10 NOTE — ED Notes (Signed)
Kelly Wilson (husband) can be reached at [phone] with questions/concerns/updates

## 2015-05-10 NOTE — H&P (Signed)
[facility] History and Physical  Kelly Wilson NID:[id number] DOB: 1955-09-24 DOA: 05/10/2015  Referring physician: Dr.Oni. PCP: [name], MD  Specialists: Dr. Gwenlyn Found. Cardiologist.  Chief Complaint: Chest pain.  HPI: Kelly Wilson is a 59 y.o. female with history of hypertension presents to the ER after patient started having chest pain. Patient stays around midnight while asleep patient started developing chest pain which was retrosternal radiating to the neck and left arm. Patient also is mildly diaphoretic. Denies any associated shortness of breath. Felt mildly nauseated. Since the pain was persistent and patient came to the ER. In the ER patient's pain resolved after 2 sublingual nitroglycerin tablets. Presently patient is chest pain-free. EKG chest x-ray and cardiac markers are unremarkable. Patient states she has had a stress test more than 15 years ago and as per the patient it was unremarkable.   Review of Systems: As presented in the history of presenting illness, rest negative.  Past Medical History  Diagnosis Date  . Hyperlipidemia   . Hypertension   . 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 HYPERLIPIDEMIA 05/17/2007  . HYPERTENSION 05/17/2007  . GERD 08/09/2008  . OSTEOARTHRITIS 05/17/2007  . SPINAL STENOSIS 05/17/2007  . TRANSIENT ISCHEMIC ATTACK, HX OF 05/17/2007  . Left ear hearing loss     Mild   Past Surgical History  Procedure Laterality Date  . Abdominal hysterectomy  1995  . Tubal ligation    . External ear surgery      left side x's 2  . Stapedectomy    . Throidectomy    . Orif elbow fracture  02/2011  . Skin graft full thickness arm    . Skin graft full thickness leg    . Cesarean section  1993  . Ankle surgery  2005  . Thyroidectomy, partial Right 1980  . Knee surgery  02/2011   Social History:  reports that she has never smoked. She does not have any smokeless tobacco history on file. She reports that she does not drink alcohol or use  illicit drugs. Where does patient live home. Can patient participate in ADLs? Yes.  Allergies  Allergen Reactions  . Morphine Hives  . Rosuvastatin Other (See Comments)    myalgia    Family History:  Family History  Problem Relation Age of Onset  . Coronary artery disease Mother   . Diabetes Mother   . Coronary artery disease Father   . Diabetes Sister     2 sister with DM  . Cancer Other     uncle had rectal cancer  . Hypertension Other   . Hyperlipidemia Other       Prior to Admission medications   Medication Sig Start Date End Date Taking? Authorizing Provider  aspirin 325 MG tablet Take 325 mg by mouth daily.   Yes Historical Provider, MD  gabapentin (NEURONTIN) 300 MG capsule three (900mg ) in am, two (600mg ) in afternoon and three (900mg ) at night Patient taking differently: Take 600-900 mg by mouth 3 (three) times daily. three (900mg ) in am, two (600mg ) in afternoon and three (900mg ) at night 01/23/15  Yes Garvin Fila, MD  omeprazole (PRILOSEC) 40 MG capsule Take 40 mg by mouth daily.     Yes Historical Provider, MD  triamterene-hydrochlorothiazide (MAXZIDE-25) 37.5-25 MG per tablet Take 1 tablet by mouth daily. 01/28/15  Yes [name], MD    Physical Exam: Filed Vitals:   05/10/15 0415 05/10/15 0430 05/10/15 0445 05/10/15 0530  BP: 116/57 120/59 103/53 125/58  Pulse: 68  66 68 67  Temp:      TempSrc:      Resp: 10 13 13 15   Height:      Weight:      SpO2: 97% 98% 97% 95%     General:  Moderately built and nourished.  Eyes: Anicteric no pallor.  ENT: No discharge from the ears eyes nose or mouth.  Neck: No mass felt. No JVD appreciated.  Cardiovascular: S1-S2 heard.  Respiratory: No rhonchi or crepitations.  Abdomen: Soft nontender bowel sounds present.  Skin: No rash.  Musculoskeletal: No edema.  Psychiatric: Appears normal.  Neurologic: Alert awake oriented to time place and person. Moves all extremities.  Labs on Admission:  Basic  Metabolic Panel:  Recent Labs Lab 05/10/15 0220  NA 140  K 3.4*  CL 102  CO2 28  GLUCOSE 113*  BUN 17  CREATININE 1.02*  CALCIUM 9.7   Liver Function Tests: No results for input(s): AST, ALT, ALKPHOS, BILITOT, PROT, ALBUMIN in the last 168 hours. No results for input(s): LIPASE, AMYLASE in the last 168 hours. No results for input(s): AMMONIA in the last 168 hours. CBC:  Recent Labs Lab 05/10/15 0220  WBC 6.2  HGB 12.7  HCT 39.6  MCV 77.0*  PLT 344   Cardiac Enzymes: No results for input(s): CKTOTAL, CKMB, CKMBINDEX, TROPONINI in the last 168 hours.  BNP (last 3 results) No results for input(s): BNP in the last 8760 hours.  ProBNP (last 3 results) No results for input(s): PROBNP in the last 8760 hours.  CBG: No results for input(s): GLUCAP in the last 168 hours.  Radiological Exams on Admission: Dg Chest 2 View  05/10/2015  CLINICAL DATA:  Chest pain and shortness of breath tonight. EXAM: CHEST  2 VIEW COMPARISON:  04/22/2012 FINDINGS: The cardiomediastinal contours are normal. The lungs are clear. Pulmonary vasculature is normal. No consolidation, pleural effusion, or pneumothorax. No acute osseous abnormalities are seen. Multilevel degenerative change in the spine. Surgical clips at the thoracic inlet, unchanged. IMPRESSION: No acute pulmonary process. Electronically Signed   By: Jeb Levering M.D.   On: 05/10/2015 02:35    EKG: Independently reviewed. Normal sinus rhythm.  Assessment/Plan Principal Problem:   Chest pain Active Problems:   Essential hypertension   1. Chest pain - patient chest pain concerning for angina. Patient is chest pain-free at this time. Given the family history of CAD and personal history of hypertension we will cycle cardiac markers keep patient nothing by mouth check 2-D echo and I have requested cardiology consult. Aspirin. When necessary nitroglycerin. 2. Hypertension - continue home medications. 3. Chronic kidney disease  stage II - creatinine appears to be at baseline.  I have reviewed patient's old charts. Personally reviewed patient's chest x-ray and EKG.   DVT Prophylaxis Lovenox.  Code Status: Full code.  Family Communication: Discussed with patient.  Disposition Plan: Admit for observation.    , N. [facility] Pager [phone].  If 7PM-7AM, please contact night-coverage [email or web address] Password [facility] 05/10/2015, 5:43 AM

## 2015-05-10 NOTE — Discharge Summary (Signed)
Physician Discharge Summary  Kelly Wilson AQT:[id number] DOB: 03/09/1956 DOA: 05/10/2015  PCP: [name], MD  Admit date: 05/10/2015 Discharge date: 05/10/2015  Time spent: 35 minutes  Recommendations for Outpatient Follow-up:  1. Outpatient stress test  Discharge Diagnoses:  Principal Problem:   Chest pain Active Problems:   Essential hypertension   Precordial chest pain   Chest wall pain   Discharge Condition: improved  Diet recommendation: cardiac  Filed Weights   05/10/15 0209 05/10/15 0610  Weight: 108.863 kg (240 lb) 110.995 kg (244 lb 11.2 oz)    History of present illness:  Kelly Wilson is a 59 y.o. female with history of hypertension presents to the ER after patient started having chest pain. Patient stays around midnight while asleep patient started developing chest pain which was retrosternal radiating to the neck and left arm. Patient also is mildly diaphoretic. Denies any associated shortness of breath. Felt mildly nauseated. Since the pain was persistent and patient came to the ER. In the ER patient's pain resolved after 2 sublingual nitroglycerin tablets. Presently patient is chest pain-free. EKG chest x-ray and cardiac markers are unremarkable. Patient states she has had a stress test more than 15 years ago and as per the patient it was unremarkable.   Hospital Course:  Chest pain -has risk factors for coronary disease.  -has chest wall tenderness.  -EKG reveals no diagnostic changes.  -CE negative -echo: Study Conclusions - Left ventricle: The cavity size was normal. Systolic function was vigorous. The estimated ejection fraction was in the range of 65% to 70%. Wall motion was normal; there were no regional wall motion abnormalities. Doppler parameters are consistent with abnormal left ventricular relaxation (grade 1 diastolic dysfunction). - Mitral valve: Calcified annulus. There was mild regurgitation. - Pulmonary arteries:  Systolic pressure was mildly increased. PA peak pressure: 32 mm Hg (S). early follow-up outpatient stress nuclear scan.    Essential hypertension  Blood pressure is being treated.      Hyperlipidemia  defer to primary   Procedures:  echo  Consultations:  Cards- Ron Parker  Discharge Exam: Filed Vitals:   05/10/15 1457  BP: 118/49  Pulse: 68  Temp: 98.3 F (36.8 C)  Resp: 16     Discharge Instructions   Discharge Instructions    Diet - low sodium heart healthy    Complete by:  As directed      Discharge instructions    Complete by:  As directed   Outpatient stress test     Increase activity slowly    Complete by:  As directed           Current Discharge Medication List    START taking these medications   Details  magnesium gluconate (MAGONATE) 500 MG tablet Take 1 tablet (500 mg total) by mouth daily.      CONTINUE these medications which have CHANGED   Details  gabapentin (NEURONTIN) 300 MG capsule Take 2-3 capsules (600-900 mg total) by mouth 3 (three) times daily. three (900mg ) in am, two (600mg ) in afternoon and three (900mg ) at night Qty: 240 capsule, Refills: 6   Associated Diagnoses: Cramp of both lower extremities      CONTINUE these medications which have NOT CHANGED   Details  aspirin 325 MG tablet Take 325 mg by mouth daily.    omeprazole (PRILOSEC) 40 MG capsule Take 40 mg by mouth daily.      triamterene-hydrochlorothiazide (MAXZIDE-25) 37.5-25 MG per tablet Take 1 tablet by mouth daily. Qty: 90  tablet, Refills: 3       Allergies  Allergen Reactions  . Morphine Hives  . Rosuvastatin Other (See Comments)    myalgia   Follow-up Information    Follow up with [name], MD In 1 week.   Specialty:  Endocrinology   Contact information:   [address] [place] [address] [phone]       Follow up with Quay Burow, MD.   Specialties:  Cardiology, Radiology   Why:  outpatient stress test    Contact information:   [address] [place] [address] [phone]        The results of significant diagnostics from this hospitalization (including imaging, microbiology, ancillary and laboratory) are listed below for reference.    Significant Diagnostic Studies: Dg Chest 2 View  05/10/2015  CLINICAL DATA:  Chest pain and shortness of breath tonight. EXAM: CHEST  2 VIEW COMPARISON:  04/22/2012 FINDINGS: The cardiomediastinal contours are normal. The lungs are clear. Pulmonary vasculature is normal. No consolidation, pleural effusion, or pneumothorax. No acute osseous abnormalities are seen. Multilevel degenerative change in the spine. Surgical clips at the thoracic inlet, unchanged. IMPRESSION: No acute pulmonary process. Electronically Signed   By: Jeb Levering M.D.   On: 05/10/2015 02:35    Microbiology: No results found for this or any previous visit (from the past 240 hour(s)).   Labs: Basic Metabolic Panel:  Recent Labs Lab 05/10/15 0220 05/10/15 0922  NA 140  --   K 3.4*  --   CL 102  --   CO2 28  --   GLUCOSE 113*  --   BUN 17  --   CREATININE 1.02* 1.08*  CALCIUM 9.7  --    Liver Function Tests: No results for input(s): AST, ALT, ALKPHOS, BILITOT, PROT, ALBUMIN in the last 168 hours. No results for input(s): LIPASE, AMYLASE in the last 168 hours. No results for input(s): AMMONIA in the last 168 hours. CBC:  Recent Labs Lab 05/10/15 0220 05/10/15 0922  WBC 6.2 4.5  HGB 12.7 11.2*  HCT 39.6 35.1*  MCV 77.0* 76.8*  PLT 344 290   Cardiac Enzymes:  Recent Labs Lab 05/10/15 0922 05/10/15 1550  TROPONINI <0.03 <0.03   BNP: BNP (last 3 results) No results for input(s): BNP in the last 8760 hours.  ProBNP (last 3 results) No results for input(s): PROBNP in the last 8760 hours.  CBG: No results for input(s): GLUCAP in the last 168 hours.     SignedEulogio Bear  [facility] 05/10/2015, 5:36 PM

## 2015-05-10 NOTE — Progress Notes (Signed)
Patient admitted after midnight, please see H&P.  Consulted cards- await recommendations.  Echo ordered  Eulogio Bear DO

## 2015-05-10 NOTE — ED Notes (Signed)
Pt. woke up this morning with central chest pain , SOB , nausea and diaphoresis .

## 2015-05-10 NOTE — Consult Note (Addendum)
CARDIOLOGY CONSULT NOTE   Patient ID: Kelly Wilson MRN: [id number] DOB/AGE: 61-Mar-1957 79 y.o.  Admit Date: 05/10/2015 Referring Physician: Primary Physician: [name], MD Primary Cardiologist    New  Clinical Summary Kelly Wilson is a 59 y.o.female. There is no prior documented cardiac disease. The patient does have hypertension. She also has significant hyperlipidemia. By history she had myalgias from Crestor. There is a family history of coronary disease. The patient awoke from sleep and wanted to go to the bathroom. She had a cramp in her leg. She also then developed some diaphoresis and localized left anterior chest discomfort. The pain persisted in her chest. There was question of some radiation to her arm. She was brought to the emergency room. There was no acute EKG change. She thinks that the nitroglycerin did help her pain. She still has mild residual discomfort to palpation of the left anterior chest. She is stable at this time.   Allergies  Allergen Reactions  . Morphine Hives  . Rosuvastatin Other (See Comments)    myalgia    Medications Scheduled Medications: . aspirin EC  325 mg Oral Daily  . enoxaparin (LOVENOX) injection  40 mg Subcutaneous Q24H  . gabapentin  600 mg Oral Daily  . gabapentin  900 mg Oral BID  . magnesium gluconate  500 mg Oral Daily  . ondansetron      . pantoprazole  40 mg Oral Daily  . triamterene-hydrochlorothiazide  1 tablet Oral Daily     Infusions:     PRN Medications:  acetaminophen, nitroGLYCERIN, ondansetron (ZOFRAN) IV   Past Medical History  Diagnosis Date  . Hyperlipidemia   . Hypertension   . 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 HYPERLIPIDEMIA 05/17/2007  . HYPERTENSION 05/17/2007  . GERD 08/09/2008  . OSTEOARTHRITIS 05/17/2007  . SPINAL STENOSIS 05/17/2007  . TRANSIENT ISCHEMIC ATTACK, HX OF 05/17/2007  . Left ear hearing loss     Mild    Past Surgical History  Procedure Laterality Date  .  Abdominal hysterectomy  1995  . Tubal ligation    . External ear surgery      left side x's 2  . Stapedectomy    . Throidectomy    . Orif elbow fracture  02/2011  . Skin graft full thickness arm    . Skin graft full thickness leg    . Cesarean section  1993  . Ankle surgery  2005  . Thyroidectomy, partial Right 1980  . Knee surgery  02/2011    Family History  Problem Relation Age of Onset  . Coronary artery disease Mother   . Diabetes Mother   . Coronary artery disease Father   . Diabetes Sister     2 sister with DM  . Cancer Other     uncle had rectal cancer  . Hypertension Other   . Hyperlipidemia Other     Social History Kelly Wilson reports that she has never smoked. She does not have any smokeless tobacco history on file. Kelly Wilson reports that she does not drink alcohol.  Review of Systems Patient denies fever, chills, headache, rash, change in vision, change in hearing, cough, nausea or vomiting, urinary symptoms. All other systems are reviewed and are negative.  Physical Examination Blood pressure 145/67, pulse 71, temperature 98.2 F (36.8 C), temperature source Oral, resp. rate 12, height 5\' 6"  (1.676 m), weight 244 lb 11.2 oz (110.995 kg), SpO2 100 %.  Intake/Output Summary (Last 24 hours) at 05/10/15 1223 Last data  filed at 05/10/15 1000  Gross per 24 hour  Intake      0 ml  Output      0 ml  Net      0 ml    Patient is oriented to person time and place. Affect is normal. Her husband is in the room. Head is atraumatic. Sclera and conjunctiva are normal. There is no jugular venous distention. Lungs are clear. Respiratory effort is not labored. Cardiac exam reveals S1 and S2. There are no clicks or significant murmurs. The abdomen is soft. There is no peripheral edema. The patient does have pain to palpation of the left anterior chest. This pain is similar to the pain that she had that brought her to the hospital.  Prior Cardiac Testing/Procedures  Lab  Results  Basic Metabolic Panel:  Recent Labs Lab 05/10/15 0220 05/10/15 0922  NA 140  --   K 3.4*  --   CL 102  --   CO2 28  --   GLUCOSE 113*  --   BUN 17  --   CREATININE 1.02* 1.08*  CALCIUM 9.7  --     Liver Function Tests: No results for input(s): AST, ALT, ALKPHOS, BILITOT, PROT, ALBUMIN in the last 168 hours.  CBC:  Recent Labs Lab 05/10/15 0220 05/10/15 0922  WBC 6.2 4.5  HGB 12.7 11.2*  HCT 39.6 35.1*  MCV 77.0* 76.8*  PLT 344 290    Cardiac Enzymes:  Recent Labs Lab 05/10/15 0922  TROPONINI <0.03    BNP: Invalid input(s): POCBNP   Radiology: Dg Chest 2 View  05/10/2015  CLINICAL DATA:  Chest pain and shortness of breath tonight. EXAM: CHEST  2 VIEW COMPARISON:  04/22/2012 FINDINGS: The cardiomediastinal contours are normal. The lungs are clear. Pulmonary vasculature is normal. No consolidation, pleural effusion, or pneumothorax. No acute osseous abnormalities are seen. Multilevel degenerative change in the spine. Surgical clips at the thoracic inlet, unchanged. IMPRESSION: No acute pulmonary process. Electronically Signed   By: Jeb Levering M.D.   On: 05/10/2015 02:35     ECG: I have personally reviewed her EKGs. There is no significant change.  Telemetry:    I have reviewed telemetry today May 10, 2015. There is normal sinus rhythm.   Impression and Recommendations    Chest pain    The patient presented with chest pain. She does have risk factors for coronary disease. She also has chest wall tenderness. So far there is no definite proof of an acute coronary syndrome. EKG reveals no diagnostic changes. Her first troponin is normal. 2-D echo has been ordered for today. The plan will be to proceed with 2-D echo. Also plan to obtain 3 troponin values over time. If her troponins become abnormal, more aggressive in-hospital evaluation will be indicated. If the troponins are normal and if her 2-D echo reveals normal left ventricular function,  she could be discharged home after this evaluation with plans for an early follow-up outpatient stress nuclear scan.    Essential hypertension    Blood pressure is being treated.       Chest wall pain      She definitely has some mild continued chest wall discomfort to palpation. This may possibly be the basis of all of her admitting pain. However we need to be sure that we have no evidence of an acute coronary syndrome.    Hyperlipidemia     By history the patient had myalgias from Crestor. I do not have the complete  documentation. She is not on a statin. Over time, it will be important to review the history of her question of statin intolerance. With her risk factors, consideration could be given to other statins. If it turns out that she has coronary disease, it will be even more important to push for statin therapy or consideration of PSK9 therapy. The patient has been seen in the past by Dr. [name] for primary care and endocrinology. He would be extremely well suited to assess the approach to her lipid status over time.  Daryel November, MD 05/10/2015, 12:23 PM

## 2015-05-10 NOTE — ED Provider Notes (Signed)
CSN: [id number]     Arrival date & time 05/10/15  0205 History  By signing my name below, I, Kelly Wilson, attest that this documentation has been prepared under the direction and in the presence of Kelly Balls, MD. Electronically Signed: Helane Wilson, ED Scribe. 05/10/2015. 3:43 AM.    Chief Complaint  Patient presents with  . Chest Pain   The history is provided by the patient. No language interpreter was used.   HPI Comments: Kelly Wilson is a 59 y.o. female who presents to the Emergency Department complaining of constant, pressure-like, central chest pain radiating up to the left side of her neck onset 3 hours ago. Pt states the pain woke her up. She notes the pain has lessened slightly since arriving in the ED. She reports associated diaphoresis, nausea (resolved after medication), and a cramp in her right thigh. She has tried taking an aspirin with no relief. She states she was lying down for 1.5 hours after taking the medication, but that the pain did not change or alleviate. She denies ever having similar symptoms before. She reports having a normal stress test 12 years ago. She denies a PMHx of blood clots. Pt denies SOB, leg swelling, fever, rhinorrhea, and cough.   Past Medical History  Diagnosis Date  . Hyperlipidemia   . Hypertension   . 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 HYPERLIPIDEMIA 05/17/2007  . HYPERTENSION 05/17/2007  . GERD 08/09/2008  . OSTEOARTHRITIS 05/17/2007  . SPINAL STENOSIS 05/17/2007  . TRANSIENT ISCHEMIC ATTACK, HX OF 05/17/2007  . Left ear hearing loss     Mild   Past Surgical History  Procedure Laterality Date  . Abdominal hysterectomy  1995  . Tubal ligation    . External ear surgery      left side x's 2  . Stapedectomy    . Throidectomy    . Orif elbow fracture  02/2011  . Skin graft full thickness arm    . Skin graft full thickness leg    . Cesarean section  1993  . Ankle surgery  2005  . Thyroidectomy, partial Right 1980  . Knee  surgery  02/2011   Family History  Problem Relation Age of Onset  . Coronary artery disease Mother   . Diabetes Mother   . Coronary artery disease Father   . Diabetes Sister     2 sister with DM  . Cancer Other     uncle had rectal cancer  . Hypertension Other   . Hyperlipidemia Other    Social History  Substance Use Topics  . Smoking status: Never Smoker   . Smokeless tobacco: None     Comment: Married, she is a Cabin [employer]  . Alcohol Use: No   OB History    No data available     Review of Systems A complete 10 system review of systems was obtained and all systems are negative except as noted in the HPI and PMH.   Allergies  Morphine and Rosuvastatin  Home Medications   Prior to Admission medications   Medication Sig Start Date End Date Taking? Authorizing Provider  aspirin 325 MG tablet Take 325 mg by mouth daily.   Yes Historical Provider, MD  gabapentin (NEURONTIN) 300 MG capsule three (900mg ) in am, two (600mg ) in afternoon and three (900mg ) at night Patient taking differently: Take 600-900 mg by mouth 3 (three) times daily. three (900mg ) in am, two (600mg ) in afternoon and three (900mg ) at night 01/23/15  Yes Garvin Fila,  MD  omeprazole (PRILOSEC) 40 MG capsule Take 40 mg by mouth daily.     Yes Historical Provider, MD  triamterene-hydrochlorothiazide (MAXZIDE-25) 37.5-25 MG per tablet Take 1 tablet by mouth daily. 01/28/15  Yes [name], MD   BP 121/52 mmHg  Pulse 76  Temp(Src) 98.6 F (37 C) (Oral)  Resp 13  Ht 5\' 6"  (1.676 m)  Wt 240 lb (108.863 kg)  BMI 38.76 kg/m2  SpO2 94% Physical Exam  Constitutional: She is oriented to person, place, and time. She appears well-developed and well-nourished. No distress.  HENT:  Head: Normocephalic and atraumatic.  Nose: Nose normal.  Mouth/Throat: Oropharynx is clear and moist. No oropharyngeal exudate.  Eyes: Conjunctivae and EOM are normal. Pupils are equal, round, and reactive to light. No scleral icterus.   Neck: Normal range of motion. Neck supple. No JVD present. No tracheal deviation present. No thyromegaly present.  Cardiovascular: Normal rate, regular rhythm and normal heart sounds.  Exam reveals no gallop and no friction rub.   No murmur heard. Pulmonary/Chest: Effort normal and breath sounds normal. No respiratory distress. She has no wheezes. She exhibits no tenderness.  Abdominal: Soft. Bowel sounds are normal. She exhibits no distension and no mass. There is no tenderness. There is no rebound and no guarding.  Musculoskeletal: Normal range of motion. She exhibits no edema or tenderness.  Lymphadenopathy:    She has no cervical adenopathy.  Neurological: She is alert and oriented to person, place, and time. No cranial nerve deficit. She exhibits normal muscle tone.  Skin: Skin is warm and dry. No rash noted. No erythema. No pallor.  Nursing note and vitals reviewed.   ED Course  Procedures  DIAGNOSTIC STUDIES: Oxygen Saturation is 99% on RA, normal by my interpretation.    COORDINATION OF CARE: 3:36 AM - Discussed possible heart issue. Discussed plans to order diagnostic studies and NTG. Pt advised of plan for treatment and pt agrees.  Labs Review Labs Reviewed  BASIC METABOLIC PANEL - Abnormal; Notable for the following:    Potassium 3.4 (*)    Glucose, Bld 113 (*)    Creatinine, Ser 1.02 (*)    GFR calc non Af Amer 58 (*)    All other components within normal limits  CBC - Abnormal; Notable for the following:    RBC 5.14 (*)    MCV 77.0 (*)    MCH 24.7 (*)    All other components within normal limits  I-STAT TROPOININ, ED    Imaging Review Dg Chest 2 View  05/10/2015  CLINICAL DATA:  Chest pain and shortness of breath tonight. EXAM: CHEST  2 VIEW COMPARISON:  04/22/2012 FINDINGS: The cardiomediastinal contours are normal. The lungs are clear. Pulmonary vasculature is normal. No consolidation, pleural effusion, or pneumothorax. No acute osseous abnormalities are seen.  Multilevel degenerative change in the spine. Surgical clips at the thoracic inlet, unchanged. IMPRESSION: No acute pulmonary process. Electronically Signed   By: Jeb Levering M.D.   On: 05/10/2015 02:35   I have personally reviewed and evaluated these images and lab results as part of my medical decision-making.   EKG Interpretation   Date/Time:  Friday May 10 2015 02:08:03 EDT Ventricular Rate:  84 PR Interval:  168 QRS Duration: 88 QT Interval:  390 QTC Calculation: 460 R Axis:   -37 Text Interpretation:  Normal sinus rhythm Left axis deviation Moderate  voltage criteria for LVH, may be normal variant Possible Lateral infarct ,  age undetermined No significant change  since last tracing Confirmed by  Glynn Octave [phone]) on 05/10/2015 5:00:02 AM      MDM   Final diagnoses:  None    Patient presents to the ED for chest pain.  She has a truly concerning story for ACS with radiation to the neck, L arm numbness, nausea and diaphoresis.  Initial EKG and trop are unremarkable for MI.  Dr. Jabier Mutton will admit to tele for further care.  After 2 nitro, patient chest pain has resolved.  Full dose aspirin taken at home.     I, ,, personally performed the services described in this documentation. All medical record entries made by the scribe were at my direction and in my presence.  I have reviewed the chart and discharge instructions and agree that the record reflects my personal performance and is accurate and complete. ,.  05/10/2015. 5:01 AM.     Kelly Balls, MD 05/10/15 [phone]

## 2015-05-10 NOTE — Progress Notes (Signed)
Discharge instructions given. Pt verbalized understanding and all questions were answered.  

## 2015-05-17 NOTE — Telephone Encounter (Signed)
Error

## 2015-08-29 ENCOUNTER — Telehealth: Payer: Self-pay

## 2015-08-29 DIAGNOSIS — R252 Cramp and spasm: Secondary | ICD-10-CM

## 2015-08-29 MED ORDER — GABAPENTIN 300 MG PO CAPS: 600.0000 mg | ORAL_CAPSULE | Freq: Three times a day (TID) | ORAL | Status: DC

## 2015-09-09 NOTE — Telephone Encounter (Signed)
Pt called sts she needs medication. She thinks she will be out in 2 days. Please send to pharmacy

## 2015-09-10 ENCOUNTER — Other Ambulatory Visit: Payer: Self-pay

## 2015-09-10 DIAGNOSIS — R252 Cramp and spasm: Secondary | ICD-10-CM

## 2015-09-10 MED ORDER — GABAPENTIN 300 MG PO CAPS: 600.0000 mg | ORAL_CAPSULE | Freq: Three times a day (TID) | ORAL | Status: DC

## 2015-09-10 NOTE — Telephone Encounter (Signed)
Rn call patient back about her gabapentin refills.Rn stated the medication was refill again on 08/26/2015 with 6 refills.Rn tried call [facility] on [address] but they were not open till 0900.Rn will call once open.

## 2015-09-10 NOTE — Telephone Encounter (Signed)
Rn call [facility] at [phone]. Prescription fax to [phone].Prescription of gabapentin has to be fax because its exceeding the dose. Fax receive and confirm.

## 2015-10-07 ENCOUNTER — Telehealth: Payer: Self-pay | Admitting: Neurology

## 2015-10-07 ENCOUNTER — Other Ambulatory Visit: Payer: Self-pay

## 2015-10-07 DIAGNOSIS — R252 Cramp and spasm: Secondary | ICD-10-CM

## 2015-10-07 MED ORDER — GABAPENTIN 300 MG PO CAPS: 600.0000 mg | ORAL_CAPSULE | Freq: Three times a day (TID) | ORAL | Status: DC

## 2015-10-07 NOTE — Telephone Encounter (Signed)
Gabpentin medication refill done. Fax to pharmacy.

## 2015-10-07 NOTE — Telephone Encounter (Signed)
Pt requesting refill on gabapentin (NEURONTIN) 300 MG capsule. Thank you

## 2015-11-05 ENCOUNTER — Ambulatory Visit (INDEPENDENT_AMBULATORY_CARE_PROVIDER_SITE_OTHER): Payer: BLUE CROSS/BLUE SHIELD | Admitting: Adult Health

## 2015-11-05 ENCOUNTER — Encounter: Payer: Self-pay | Admitting: Adult Health

## 2015-11-05 DIAGNOSIS — R252 Cramp and spasm: Secondary | ICD-10-CM

## 2015-11-05 MED ORDER — GABAPENTIN 300 MG PO CAPS: 900.0000 mg | ORAL_CAPSULE | Freq: Three times a day (TID) | ORAL | Status: DC

## 2015-11-05 NOTE — Progress Notes (Signed)
PATIENT: Kelly Wilson DOB: 1956-06-21  REASON FOR VISIT: follow up- chronic muscle cramps, myalgia HISTORY FROM: patient  HISTORY OF PRESENT ILLNESS: Kelly Wilson is a 60 year old female with a history of chronic muscle cramps and myalgia. She returns today for follow-up. The patient currently takes gabapentin 900 mg in the morning, 600 mg at noon and 900 mg in the evening. She states that if she misses a dose her cramps will increase. She states that she has cramps all over her body. She states that when she gets a cramp in the left thigh area it can last for 15-30 minutes. She states that she's tried many home remedies for cramps such as mustard and vinegar without benefit. She states that recently she is noticed that she's had more "twitching" on the left side of the body. In the past she's had nerve conduction studies that were unremarkable. The patient does have some chronic changes in the neck and back that could be contributing to her symptoms. She denies any new neurological symptoms. She returns today for an evaluation.  HISTORY 05/07/15: Kelly Wilson is a 60 year old female with a history of chronic muscle cramps and myalgia. She returns today for follow-up. The patient has been taking gabapentin 3 times a day. At the last visit she was tried on long-acting gabapentin. She reports that her insurance does not cover this medication so she did not start it. She reports that the gabapentin does help with her muscle cramps and twitching. She reports more recently she has more of a jerking sensation in the legs. She has found that she does certain stretching this does relieve the jerking and cramps. The patient has had a nerve conduction study with EMG that was unremarkable. She is also had a CT myelogram of the lumbar spine that didn't show any significant changes. At the last visit she was also complaining of the ptosis. She had blood work and nerve conduction studies to evaluate for  myasthenia gravis however all tests were negative. She states that she will continue to have the ptosis but denies any double vision. Denies any weakness in the extremities. Denies any weakness in the jaw while eating. Denies any trouble swallowing. She returns today for an evaluation.  HISTORY  (SETHI): Initial Consult 11/14/2014 : Kelly Wilson with chronic muscle cramps and twitchings since several years. She states that these have been slowly progressive and involve all muscles. The twitchings and cramps appear to be more related to exertion and by being active that day. These often involve her hamstrings but can involve the cost as well as shoulder muscles. Application of local heat and stretching of the muscles helps only a little. She has seen her primary physician [name] who has prescribed Robaxin which she takes mainly at night but does not help a lot. Her orthopedic surgeon prescribed a different muscle relaxant which also did not help. She denies tingling numbness or burning in her feet or hands. She does get cramps in her toes and occasionally in the cough and hamstrings. She recently underwent CT myelogram of the lumbar spine on 09/27/14 which I personally reviewed and shows only moderate to severe chronic facet arthropathic changes at T12-L1 and borderline mild spinal stenosis at L4-5 without significant compression. The patient denies any muscle weakness, gait or balance problems. She has had no falls. She denies any difficulty getting up from a chair or from a sitting position. She has been taking ibuprofen 200 mg 3 times daily  but this is mostly for back pain. She has had to take increasing doses of magnesium and now takes 750 mg daily which seems to help and if she misses a dose for cramps are worse. She was seen by me in 2009 for left facial and temporal pain and paresthesias. Evaluation for temporal arteritis at that time was unyielding. Patient was tried by me on Topamax. I  do not have all of her follow-up visits notes to review today but patient tells me that she did complain of cramps and aches to me at that time an EMG nerve conduction study was done which was unremarkable but I do not have any of that documented in my records. It may be possible that she may have seen a different neurologist in a different practice but she cannot remember at this time.She has no family history of neuromuscular disorder or similar illness. Update 02/04/2015 : She returns for follow-up after last visit 3 months ago. She has noticed improvement in her cramps and myalgias since aching gabapentin but she has had to increase the dose of 900 mg morning, 600 in the afternoon and 900 at night which amounts to 8 tablets a day. She would prefer to have a longer acting medicine if possible. She did undergo lab work on 01/24/15 which showed normal TSH, muscle enzymes and LDH. She in fact has gone several days now without cramps. She has also noticed slight droopiness of her eyelids mainly the left eye over the last few months. She denies any diurnal fluctuation, fatigability, weakness or double vision  REVIEW OF SYSTEMS: Out of a complete 14 system review of symptoms, the patient complains only of the following symptoms, and all other reviewed systems are negative.  Ringing in ears, muscle cramps  ALLERGIES: Allergies  Allergen Reactions  . Morphine Hives  . Rosuvastatin Other (See Comments)    myalgia    HOME MEDICATIONS: Outpatient Prescriptions Prior to Visit  Medication Sig Dispense Refill  . aspirin 325 MG tablet Take 325 mg by mouth daily.    Marland Kitchen gabapentin (NEURONTIN) 300 MG capsule Take 2-3 capsules (600-900 mg total) by mouth 3 (three) times daily. three (900mg ) in am, two (600mg ) in afternoon and three (900mg ) at night 240 capsule 0  . magnesium gluconate (MAGONATE) 500 MG tablet Take 1 tablet (500 mg total) by mouth daily.    Marland Kitchen omeprazole (PRILOSEC) 40 MG capsule Take 40 mg by mouth  daily.      Marland Kitchen triamterene-hydrochlorothiazide (MAXZIDE-25) 37.5-25 MG per tablet Take 1 tablet by mouth daily. 90 tablet 3   No facility-administered medications prior to visit.    PAST MEDICAL HISTORY: Past Medical History  Diagnosis Date  . Hyperlipidemia   . Hypertension   . 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 HYPERLIPIDEMIA 05/17/2007  . HYPERTENSION 05/17/2007  . GERD 1/Kelly/2010  . OSTEOARTHRITIS 05/17/2007  . SPINAL STENOSIS 05/17/2007  . TRANSIENT ISCHEMIC ATTACK, HX OF 05/17/2007  . Left ear hearing loss     Mild    PAST SURGICAL HISTORY: Past Surgical History  Procedure Laterality Date  . Abdominal hysterectomy  1995  . Tubal ligation    . External ear surgery      left side x's 2  . Stapedectomy    . Throidectomy    . Orif elbow fracture  02/2011  . Skin graft full thickness arm    . Skin graft full thickness leg    . Cesarean section  1993  . Ankle surgery  2005  .  Thyroidectomy, partial Right 1980  . Knee surgery  02/2011    FAMILY HISTORY: Family History  Problem Relation Age of Onset  . Coronary artery disease Mother   . Diabetes Mother   . Coronary artery disease Father   . Diabetes Sister     2 sister with DM  . Cancer Other     uncle had rectal cancer  . Hypertension Other   . Hyperlipidemia Other     SOCIAL HISTORY: Social History   Social History  . Marital Status: Married    Spouse Name: N/A  . Number of Children: N/A  . Years of Education: N/A   Occupational History  . realtor    Social History Main Topics  . Smoking status: Never Smoker   . Smokeless tobacco: Not on file     Comment: Married, she is a Cabin [employer]  . Alcohol Use: No  . Drug Use: No  . Sexual Activity: Not on file   Other Topics Concern  . Not on file   Social History Narrative   Married, Cabin [employer], 2 daughters   Caffeine use: tea daily      PHYSICAL EXAM  Filed Vitals:   11/05/15 0849  BP: 170/82  Pulse: 68  Height: 5\' 6"  (1.676 m)  Weight: 252 lb  (114.306 kg)   Body mass index is 40.69 kg/(m^2).  Generalized: Well developed, in no acute distress   Neurological examination  Mentation: Alert oriented to time, place, history taking. Follows all commands speech and language fluent Cranial nerve II-XII: Pupils were equal round reactive to light. Extraocular movements were full, visual field were full on confrontational test. Facial sensation and strength were normal. Uvula tongue midline. Head turning and shoulder shrug  were normal and symmetric. Motor: The motor testing reveals 5 over 5 strength of all 4 extremities. Good symmetric motor tone is noted throughout.  Sensory: Sensory testing is intact to soft touch on all 4 extremities. No evidence of extinction is noted.  Coordination: Cerebellar testing reveals good finger-nose-finger and heel-to-shin bilaterally.  Gait and station: Gait is normal. Tandem gait is normal. Romberg is negative. No drift is seen.  Reflexes: Deep tendon reflexes are symmetric and normal bilaterally.   DIAGNOSTIC DATA (LABS, IMAGING, TESTING) - I reviewed patient records, labs, notes, testing and imaging myself where available.  Lab Results  Component Value Date   WBC 4.5 10/Kelly/2016   HGB 11.2* 10/Kelly/2016   HCT 35.1* 10/Kelly/2016   MCV 76.8* 10/Kelly/2016   PLT 290 10/Kelly/2016      Component Value Date/Time   NA 140 10/Kelly/2016 0220   NA 140 11/14/2014 1005   K 3.4* 10/Kelly/2016 0220   CL 102 10/Kelly/2016 0220   CO2 28 10/Kelly/2016 0220   GLUCOSE 113* 10/Kelly/2016 0220   GLUCOSE 93 11/14/2014 1005   BUN 17 10/Kelly/2016 0220   BUN 23 11/14/2014 1005   CREATININE 1.08* 10/Kelly/2016 0922   CREATININE 0.91 08/03/2013 0754   CALCIUM 9.7 10/Kelly/2016 0220   PROT 6.7 01/24/2015 1510   PROT 6.3 11/14/2014 1005   ALBUMIN 4.1 01/24/2015 1510   ALBUMIN 4.2 11/14/2014 1005   AST 17 01/24/2015 1510   ALT 14 01/24/2015 1510   ALKPHOS 61 01/24/2015 1510   BILITOT 0.3 01/24/2015 1510   BILITOT 0.3 11/14/2014 1005    GFRNONAA 54* 10/Kelly/2016 0922   GFRNONAA 69 08/03/2013 0754   GFRAA >60 10/Kelly/2016 0922   GFRAA 80 08/03/2013 0754    Lab Results  Component Value Date  HGBA1C 5.0 01/24/2015    Lab Results  Component Value Date   TSH 2.34 01/24/2015      ASSESSMENT AND PLAN 60 y.o. year old female  has a past medical history of Hyperlipidemia; Hypertension; GOITER, MULTINODULAR (05/28/2008); HYPERLIPIDEMIA (05/17/2007); HYPERTENSION (05/17/2007); GERD (1/Kelly/2010); OSTEOARTHRITIS (05/17/2007); SPINAL STENOSIS (05/17/2007); TRANSIENT ISCHEMIC ATTACK, HX OF (05/17/2007); and Left ear hearing loss. here with:  1. Muscle cramps and myalgia  The patient will increase her gabapentin to 900 mg 3 times a day. If this is not beneficial she will let us know. If the twitching on the left side becomes bothersome she will also let us know. She has tried muscle relaxers in the past without much benefit. She will follow-up in 6 months with Dr. Leonie Man.     Kelly Givens, MSN, NP-C 11/05/2015, 9:10 AM [name] [address], [place], [place] [address] [phone]

## 2015-11-05 NOTE — Patient Instructions (Signed)
Increase gabapentin to 900 mg three times a day If your symptoms worsen or you develop new symptoms please let us know.

## 2015-11-06 NOTE — Progress Notes (Signed)
Agree with evaluation and plan of Ward Givens, NP

## 2015-11-06 NOTE — Progress Notes (Signed)
I agree with the above plan 

## 2015-12-16 ENCOUNTER — Emergency Department (HOSPITAL_COMMUNITY): Payer: BLUE CROSS/BLUE SHIELD

## 2015-12-16 ENCOUNTER — Inpatient Hospital Stay (HOSPITAL_COMMUNITY): Payer: BLUE CROSS/BLUE SHIELD

## 2015-12-16 ENCOUNTER — Inpatient Hospital Stay (HOSPITAL_COMMUNITY)
Admission: EM | Admit: 2015-12-16 | Discharge: 2015-12-27 | DRG: 020 | Disposition: A | Discharge status: Rehabilitation Facility | Payer: BLUE CROSS/BLUE SHIELD | Attending: Neurology | Admitting: Neurology

## 2015-12-16 ENCOUNTER — Encounter (HOSPITAL_COMMUNITY): Payer: Self-pay | Admitting: Emergency Medicine

## 2015-12-16 DIAGNOSIS — R519 Headache, unspecified: Secondary | ICD-10-CM

## 2015-12-16 DIAGNOSIS — I6031 Nontraumatic subarachnoid hemorrhage from right posterior communicating artery: Principal | ICD-10-CM | POA: Diagnosis present

## 2015-12-16 DIAGNOSIS — R32 Unspecified urinary incontinence: Secondary | ICD-10-CM | POA: Diagnosis not present

## 2015-12-16 DIAGNOSIS — Z7982 Long term (current) use of aspirin: Secondary | ICD-10-CM

## 2015-12-16 DIAGNOSIS — M791 Myalgia: Secondary | ICD-10-CM | POA: Diagnosis present

## 2015-12-16 DIAGNOSIS — K5900 Constipation, unspecified: Secondary | ICD-10-CM | POA: Diagnosis present

## 2015-12-16 DIAGNOSIS — I607 Nontraumatic subarachnoid hemorrhage from unspecified intracranial artery: Secondary | ICD-10-CM | POA: Diagnosis not present

## 2015-12-16 DIAGNOSIS — E222 Syndrome of inappropriate secretion of antidiuretic hormone: Secondary | ICD-10-CM | POA: Diagnosis present

## 2015-12-16 DIAGNOSIS — I10 Essential (primary) hypertension: Secondary | ICD-10-CM | POA: Diagnosis present

## 2015-12-16 DIAGNOSIS — R001 Bradycardia, unspecified: Secondary | ICD-10-CM | POA: Diagnosis not present

## 2015-12-16 DIAGNOSIS — E876 Hypokalemia: Secondary | ICD-10-CM | POA: Diagnosis present

## 2015-12-16 DIAGNOSIS — E669 Obesity, unspecified: Secondary | ICD-10-CM | POA: Diagnosis present

## 2015-12-16 DIAGNOSIS — R51 Headache: Secondary | ICD-10-CM | POA: Diagnosis not present

## 2015-12-16 DIAGNOSIS — R451 Restlessness and agitation: Secondary | ICD-10-CM | POA: Diagnosis not present

## 2015-12-16 DIAGNOSIS — H9192 Unspecified hearing loss, left ear: Secondary | ICD-10-CM | POA: Diagnosis present

## 2015-12-16 DIAGNOSIS — R1310 Dysphagia, unspecified: Secondary | ICD-10-CM | POA: Diagnosis present

## 2015-12-16 DIAGNOSIS — R471 Dysarthria and anarthria: Secondary | ICD-10-CM | POA: Diagnosis present

## 2015-12-16 DIAGNOSIS — Z6838 Body mass index (BMI) 38.0-38.9, adult: Secondary | ICD-10-CM | POA: Diagnosis not present

## 2015-12-16 DIAGNOSIS — R40241 Glasgow coma scale score 13-15, unspecified time: Secondary | ICD-10-CM | POA: Diagnosis present

## 2015-12-16 DIAGNOSIS — Z8673 Personal history of transient ischemic attack (TIA), and cerebral infarction without residual deficits: Secondary | ICD-10-CM

## 2015-12-16 DIAGNOSIS — R339 Retention of urine, unspecified: Secondary | ICD-10-CM | POA: Diagnosis present

## 2015-12-16 DIAGNOSIS — G911 Obstructive hydrocephalus: Secondary | ICD-10-CM | POA: Diagnosis present

## 2015-12-16 DIAGNOSIS — E785 Hyperlipidemia, unspecified: Secondary | ICD-10-CM | POA: Diagnosis present

## 2015-12-16 DIAGNOSIS — R609 Edema, unspecified: Secondary | ICD-10-CM

## 2015-12-16 DIAGNOSIS — R4020 Unspecified coma: Secondary | ICD-10-CM

## 2015-12-16 DIAGNOSIS — I161 Hypertensive emergency: Secondary | ICD-10-CM | POA: Diagnosis present

## 2015-12-16 DIAGNOSIS — M1990 Unspecified osteoarthritis, unspecified site: Secondary | ICD-10-CM | POA: Diagnosis present

## 2015-12-16 DIAGNOSIS — I639 Cerebral infarction, unspecified: Secondary | ICD-10-CM

## 2015-12-16 DIAGNOSIS — I615 Nontraumatic intracerebral hemorrhage, intraventricular: Secondary | ICD-10-CM | POA: Diagnosis not present

## 2015-12-16 DIAGNOSIS — L740 Miliaria rubra: Secondary | ICD-10-CM | POA: Diagnosis not present

## 2015-12-16 DIAGNOSIS — R739 Hyperglycemia, unspecified: Secondary | ICD-10-CM | POA: Diagnosis present

## 2015-12-16 DIAGNOSIS — R630 Anorexia: Secondary | ICD-10-CM | POA: Diagnosis not present

## 2015-12-16 DIAGNOSIS — K219 Gastro-esophageal reflux disease without esophagitis: Secondary | ICD-10-CM | POA: Diagnosis present

## 2015-12-16 DIAGNOSIS — E871 Hypo-osmolality and hyponatremia: Secondary | ICD-10-CM | POA: Diagnosis not present

## 2015-12-16 DIAGNOSIS — F419 Anxiety disorder, unspecified: Secondary | ICD-10-CM | POA: Diagnosis present

## 2015-12-16 DIAGNOSIS — R0989 Other specified symptoms and signs involving the circulatory and respiratory systems: Secondary | ICD-10-CM | POA: Diagnosis not present

## 2015-12-16 DIAGNOSIS — E049 Nontoxic goiter, unspecified: Secondary | ICD-10-CM | POA: Diagnosis present

## 2015-12-16 DIAGNOSIS — G9340 Encephalopathy, unspecified: Secondary | ICD-10-CM | POA: Diagnosis present

## 2015-12-16 DIAGNOSIS — Z0000 Encounter for general adult medical examination without abnormal findings: Secondary | ICD-10-CM

## 2015-12-16 DIAGNOSIS — Z4659 Encounter for fitting and adjustment of other gastrointestinal appliance and device: Secondary | ICD-10-CM

## 2015-12-16 DIAGNOSIS — I6789 Other cerebrovascular disease: Secondary | ICD-10-CM | POA: Diagnosis not present

## 2015-12-16 DIAGNOSIS — Z79899 Other long term (current) drug therapy: Secondary | ICD-10-CM | POA: Diagnosis not present

## 2015-12-16 DIAGNOSIS — N390 Urinary tract infection, site not specified: Secondary | ICD-10-CM | POA: Diagnosis not present

## 2015-12-16 DIAGNOSIS — D72819 Decreased white blood cell count, unspecified: Secondary | ICD-10-CM | POA: Diagnosis not present

## 2015-12-16 DIAGNOSIS — G936 Cerebral edema: Secondary | ICD-10-CM | POA: Diagnosis present

## 2015-12-16 DIAGNOSIS — I609 Nontraumatic subarachnoid hemorrhage, unspecified: Secondary | ICD-10-CM | POA: Diagnosis not present

## 2015-12-16 LAB — CBC WITH DIFFERENTIAL/PLATELET
Basophils Absolute: 0 10*3/uL (ref 0.0–0.1)
Basophils Relative: 1 %
Eosinophils Absolute: 0.2 10*3/uL (ref 0.0–0.7)
Eosinophils Relative: 3 %
HEMATOCRIT: 44.6 % (ref 36.0–46.0)
Hemoglobin: 15 g/dL (ref 12.0–15.0)
LYMPHS ABS: 3.8 10*3/uL (ref 0.7–4.0)
LYMPHS PCT: 52 %
MCH: 27.5 pg (ref 26.0–34.0)
MCHC: 33.6 g/dL (ref 30.0–36.0)
MCV: 81.7 fL (ref 78.0–100.0)
MONO ABS: 0.6 10*3/uL (ref 0.1–1.0)
MONOS PCT: 8 %
NEUTROS ABS: 2.6 10*3/uL (ref 1.7–7.7)
Neutrophils Relative %: 36 %
Platelets: 268 10*3/uL (ref 150–400)
RBC: 5.46 MIL/uL — ABNORMAL HIGH (ref 3.87–5.11)
RDW: 14 % (ref 11.5–15.5)
WBC: 7.1 10*3/uL (ref 4.0–10.5)

## 2015-12-16 LAB — COMPREHENSIVE METABOLIC PANEL
ALK PHOS: 58 U/L (ref 38–126)
ALT: 20 U/L (ref 14–54)
ANION GAP: 12 (ref 5–15)
AST: 25 U/L (ref 15–41)
Albumin: 3.9 g/dL (ref 3.5–5.0)
BILIRUBIN TOTAL: 0.8 mg/dL (ref 0.3–1.2)
BUN: 21 mg/dL — AB (ref 6–20)
CALCIUM: 9.4 mg/dL (ref 8.9–10.3)
CO2: 21 mmol/L — ABNORMAL LOW (ref 22–32)
Chloride: 105 mmol/L (ref 101–111)
Creatinine, Ser: 1.02 mg/dL — ABNORMAL HIGH (ref 0.44–1.00)
GFR calc Af Amer: >60 mL/min (ref >60)
GFR, EST NON AFRICAN AMERICAN: 58 mL/min — AB (ref >60)
Glucose, Bld: 155 mg/dL — ABNORMAL HIGH (ref 65–99)
POTASSIUM: 3.3 mmol/L — AB (ref 3.5–5.1)
Sodium: 138 mmol/L (ref 135–145)
TOTAL PROTEIN: 6.4 g/dL — AB (ref 6.5–8.1)

## 2015-12-16 LAB — URINALYSIS, ROUTINE W REFLEX MICROSCOPIC
BILIRUBIN URINE: NEGATIVE
GLUCOSE, UA: NEGATIVE mg/dL
Hgb urine dipstick: NEGATIVE
KETONES UR: 15 mg/dL — AB
LEUKOCYTES UA: NEGATIVE
NITRITE: NEGATIVE
PROTEIN: NEGATIVE mg/dL
Specific Gravity, Urine: 1.021 (ref 1.005–1.030)
pH: 6 (ref 5.0–8.0)

## 2015-12-16 LAB — MRSA PCR SCREENING: MRSA BY PCR: NEGATIVE

## 2015-12-16 LAB — LIPASE, BLOOD: LIPASE: 35 U/L (ref 11–51)

## 2015-12-16 MED ORDER — ONDANSETRON HCL 4 MG/2ML IJ SOLN
4.0000 mg | Freq: Four times a day (QID) | INTRAMUSCULAR | Status: DC | PRN
  Administered 2015-12-16 – 2015-12-26 (×3): 4 mg via INTRAVENOUS
  Filled 2015-12-16 (×3): qty 2

## 2015-12-16 MED ORDER — PROCHLORPERAZINE EDISYLATE 5 MG/ML IJ SOLN
10.0000 mg | Freq: Once | INTRAMUSCULAR | Status: AC
  Administered 2015-12-16: 10 mg via INTRAVENOUS
  Filled 2015-12-16: qty 2

## 2015-12-16 MED ORDER — IOPAMIDOL (ISOVUE-370) INJECTION 76%
INTRAVENOUS | Status: AC
  Administered 2015-12-16: 50 mL
  Filled 2015-12-16: qty 50

## 2015-12-16 MED ORDER — GABAPENTIN 300 MG PO CAPS
900.0000 mg | ORAL_CAPSULE | Freq: Three times a day (TID) | ORAL | Status: DC
  Administered 2015-12-16 – 2015-12-18 (×2): 900 mg via ORAL
  Filled 2015-12-16 (×4): qty 3

## 2015-12-16 MED ORDER — NICARDIPINE HCL IN NACL 20-0.86 MG/200ML-% IV SOLN
3.0000 mg/h | INTRAVENOUS | Status: DC
  Administered 2015-12-16: 5 mg/h via INTRAVENOUS
  Administered 2015-12-16: 7.5 mg/h via INTRAVENOUS
  Administered 2015-12-17 (×2): 10 mg/h via INTRAVENOUS
  Administered 2015-12-17: 8 mg/h via INTRAVENOUS
  Administered 2015-12-17: 7.5 mg/h via INTRAVENOUS
  Administered 2015-12-17 (×3): 15 mg/h via INTRAVENOUS
  Administered 2015-12-17: 12.5 mg/h via INTRAVENOUS
  Administered 2015-12-18: 7 mg/h via INTRAVENOUS
  Administered 2015-12-18 (×3): 10 mg/h via INTRAVENOUS
  Administered 2015-12-18: 15 mg/h via INTRAVENOUS
  Administered 2015-12-18: 3 mg/h via INTRAVENOUS
  Filled 2015-12-16 (×18): qty 200

## 2015-12-16 MED ORDER — DEXAMETHASONE SODIUM PHOSPHATE 4 MG/ML IJ SOLN
4.0000 mg | Freq: Four times a day (QID) | INTRAMUSCULAR | Status: DC
  Administered 2015-12-16 – 2015-12-23 (×26): 4 mg via INTRAVENOUS
  Filled 2015-12-16 (×26): qty 1

## 2015-12-16 MED ORDER — PANTOPRAZOLE SODIUM 40 MG IV SOLR
40.0000 mg | Freq: Every day | INTRAVENOUS | Status: DC
  Administered 2015-12-16 – 2015-12-18 (×3): 40 mg via INTRAVENOUS
  Filled 2015-12-16 (×3): qty 40

## 2015-12-16 MED ORDER — ACETAMINOPHEN 650 MG RE SUPP: 650.0000 mg | RECTAL | Status: DC | PRN

## 2015-12-16 MED ORDER — STROKE: EARLY STAGES OF RECOVERY BOOK
Freq: Once | Status: DC
  Filled 2015-12-16: qty 1

## 2015-12-16 MED ORDER — SODIUM CHLORIDE 0.9 % IV SOLN
INTRAVENOUS | Status: DC
  Administered 2015-12-16 (×2): via INTRAVENOUS

## 2015-12-16 MED ORDER — LABETALOL HCL 5 MG/ML IV SOLN
10.0000 mg | INTRAVENOUS | Status: DC | PRN
Start: 2015-12-16 — End: 2015-12-27
  Administered 2015-12-16 – 2015-12-17 (×2): 20 mg via INTRAVENOUS
  Administered 2015-12-21 – 2015-12-22 (×3): 10 mg via INTRAVENOUS
  Filled 2015-12-16: qty 4
  Filled 2015-12-16: qty 8
  Filled 2015-12-16 (×3): qty 4

## 2015-12-16 MED ORDER — HYDROMORPHONE HCL 1 MG/ML IJ SOLN
2.0000 mg | Freq: Once | INTRAMUSCULAR | Status: AC
  Administered 2015-12-16: 2 mg via INTRAVENOUS
  Filled 2015-12-16: qty 2

## 2015-12-16 MED ORDER — PANTOPRAZOLE SODIUM 40 MG PO TBEC
40.0000 mg | DELAYED_RELEASE_TABLET | Freq: Every day | ORAL | Status: DC
  Administered 2015-12-18: 40 mg via ORAL
  Filled 2015-12-16 (×2): qty 1

## 2015-12-16 MED ORDER — SODIUM CHLORIDE 0.9 % IV BOLUS (SEPSIS)
500.0000 mL | Freq: Once | INTRAVENOUS | Status: AC
  Administered 2015-12-16: 500 mL via INTRAVENOUS

## 2015-12-16 MED ORDER — ACETAMINOPHEN 325 MG PO TABS
650.0000 mg | ORAL_TABLET | ORAL | Status: DC | PRN
  Administered 2015-12-20 – 2015-12-27 (×5): 650 mg via ORAL
  Filled 2015-12-16 (×7): qty 2

## 2015-12-16 MED ORDER — POTASSIUM CHLORIDE IN NACL 40-0.9 MEQ/L-% IV SOLN
INTRAVENOUS | Status: DC
Start: 2015-12-16 — End: 2015-12-26
  Administered 2015-12-16 – 2015-12-21 (×9): 125 mL/h via INTRAVENOUS
  Administered 2015-12-23 – 2015-12-26 (×5): 75 mL/h via INTRAVENOUS
  Filled 2015-12-16 (×33): qty 1000

## 2015-12-16 MED ORDER — HYDROMORPHONE HCL 1 MG/ML IJ SOLN
0.5000 mg | INTRAMUSCULAR | Status: DC | PRN
  Administered 2015-12-16: 1 mg via INTRAVENOUS
  Administered 2015-12-20: 0.5 mg via INTRAVENOUS
  Administered 2015-12-21: 1 mg via INTRAVENOUS
  Administered 2015-12-21: 0.5 mg via INTRAVENOUS
  Administered 2015-12-25: 1 mg via INTRAVENOUS
  Administered 2015-12-26: 0.5 mg via INTRAVENOUS
  Administered 2015-12-26 (×2): 1 mg via INTRAVENOUS
  Filled 2015-12-16 (×8): qty 1

## 2015-12-16 MED ORDER — MAGNESIUM GLUCONATE 500 MG PO TABS
500.0000 mg | ORAL_TABLET | Freq: Every day | ORAL | Status: DC
  Administered 2015-12-16 – 2015-12-27 (×11): 500 mg via ORAL
  Filled 2015-12-16 (×13): qty 1

## 2015-12-16 MED ORDER — POTASSIUM CHLORIDE 10 MEQ/100ML IV SOLN
10.0000 meq | INTRAVENOUS | Status: AC
  Administered 2015-12-16 (×2): 10 meq via INTRAVENOUS
  Filled 2015-12-16 (×2): qty 100

## 2015-12-16 MED ORDER — LABETALOL HCL 5 MG/ML IV SOLN
5.0000 mg | Freq: Once | INTRAVENOUS | Status: AC
  Administered 2015-12-16: 5 mg via INTRAVENOUS
  Filled 2015-12-16: qty 4

## 2015-12-16 MED ORDER — DIVALPROEX SODIUM ER 500 MG PO TB24
500.0000 mg | ORAL_TABLET | Freq: Every day | ORAL | Status: DC
  Administered 2015-12-16 – 2015-12-19 (×3): 500 mg via ORAL
  Filled 2015-12-16 (×4): qty 1

## 2015-12-16 MED ORDER — TRIAMTERENE-HCTZ 37.5-25 MG PO TABS
1.0000 | ORAL_TABLET | Freq: Every day | ORAL | Status: DC
  Administered 2015-12-16: 1 via ORAL
  Filled 2015-12-16: qty 1

## 2015-12-16 NOTE — Progress Notes (Signed)
Pt has felt urge to void, attempted x2 on bedpan but unable to void.  Bladder scan = 938 ml.  I&O cath'd for 925 ml; pt states she feels much better.

## 2015-12-16 NOTE — Consult Note (Signed)
Reason for Consult:  PICA aneurysm Referring Physician:  Dr. Silverio Decamp (stroke neurology)  Kelly Wilson is an 60 y.o. right-handed white female.  History obtained primarily through patient's husband due to the patient's altered mental status.  HPI: Patient with a history of hypertension which is been treated for 15 or more years, who was at church yesterday morning and developed headache. She drove home, and had a ham sandwich and went to bed. However at 0300 this morning she woke her husband complaining of severe worsening of the headache, and he brought her to [facility] emergency room for evaluation. Patient was evaluated by the EDP.  CT of the brain revealed intraventricular hemorrhage (fourth ventricle, extending to the third ventricle, and to the foramina of [place] into the immediately adjacent lateral ventricle) and posterior fossa subarachnoid hemorrhage. Mild ventriculomegaly was noted. Patient was admitted to the stroke neurology service.  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 Silverio Decamp (via the neuro ICU staff).  Cardene drip was started by stroke neurology, after the aneurysm was found, to better control her blood pressure, with a goal of less than 245 mmHg systolic.  The patient herself is unable to provide extensive history, and therefore the history is obtained through her husband. She does describe some mild frontal headache. She did have a light lunch, and had some nausea, and in fact a small bit of vomiting, but is not having significant nausea at this time.   Past Medical History:  Past Medical History  Diagnosis Date  . Hyperlipidemia   . Hypertension   . 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 HYPERLIPIDEMIA 05/17/2007  . HYPERTENSION 05/17/2007  . GERD 08/09/2008  . OSTEOARTHRITIS 05/17/2007  . SPINAL STENOSIS 05/17/2007  .  TRANSIENT ISCHEMIC ATTACK, HX OF 05/17/2007  . Left ear hearing loss     Mild    Past Surgical History:  Past Surgical History  Procedure Laterality Date  . Abdominal hysterectomy  1995  . Tubal ligation    . External ear surgery      left side x's 2  . Stapedectomy    . Throidectomy    . Orif elbow fracture  02/2011  . Skin graft full thickness arm    . Skin graft full thickness leg    . Cesarean section  1993  . Ankle surgery  2005  . Thyroidectomy, partial Right 1980  . Knee surgery  02/2011    Family History:  Family History  Problem Relation Age of Onset  . Coronary artery disease Mother   . Diabetes Mother   . Coronary artery disease Father   . Diabetes Sister     2 sister with DM  . Cancer Other     uncle had rectal cancer  . Hypertension Other   . Hyperlipidemia Other     Social History:  reports that she has never smoked. She does not have any smokeless tobacco history on file. She reports that she does not drink alcohol or use illicit drugs.  Allergies:  Allergies  Allergen Reactions  . Morphine Hives  . Rosuvastatin Other (See Comments)    myalgia    Medications: I have reviewed the patient's current medications.  ROS:  Not obtainable due to the nature the patient's altered mental status.  Physical Examination: Obese white female, in no acute distress. Blood pressure 122/59, pulse  94, temperature 97.7 F (36.5 C), temperature source Oral, resp. rate 17, height 5' 6"  (1.676 m), weight 108.863 kg (240 lb), SpO2 98 %.  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 Lungs:  Clear to auscultation, symmetrical respiratory excursion. Heart:  Normal S1 and S2, no murmur. Abdomen:  Soft, nondistended, bowel sounds present. Extremity:  Posttraumatic changes involving the right elbow and knee.  No clubbing, cyanosis, or edema.  Neurological Examination: Mental Status Examination:  Awake and alert, oriented to her name, [name], and  intermittently to the month of May, but not to year. Following commands. Speech fluent. Cranial Nerve Examination:  Pupils 4 mm bilaterally, round, reactive to light. EOMI. Facial sensation intact. Facial movements symmetrical. Hearing present bilaterally. Palatal movements symmetrical. Shoulder shrug symmetrical. Tongue midline. Motor Examination:  5/5 strength in the upper and lower extremities. No drift of the upper extremities. Sensory Examination:  Intact to pinprick in the upper and lower extremities. Reflex Examination:   Diminished in the upper and lower extremities. Gait and Stance Examination:  Not tested due to the nature the patient's condition.   Results for orders placed or performed during the hospital encounter of 12/16/15 (from the past 48 hour(s))  CBC with Differential/Platelet     Status: Abnormal   Collection Time: 12/16/15  3:55 AM  Result Value Ref Range   WBC 7.1 4.0 - 10.5 K/uL   RBC 5.46 (H) 3.87 - 5.11 MIL/uL   Hemoglobin 15.0 12.0 - 15.0 g/dL   HCT 44.6 36.0 - 46.0 %   MCV 81.7 78.0 - 100.0 fL   MCH 27.5 26.0 - 34.0 pg   MCHC 33.6 30.0 - 36.0 g/dL   RDW 14.0 11.5 - 15.5 %   Platelets 268 150 - 400 K/uL   Neutrophils Relative % 36 %   Neutro Abs 2.6 1.7 - 7.7 K/uL   Lymphocytes Relative 52 %   Lymphs Abs 3.8 0.7 - 4.0 K/uL   Monocytes Relative 8 %   Monocytes Absolute 0.6 0.1 - 1.0 K/uL   Eosinophils Relative 3 %   Eosinophils Absolute 0.2 0.0 - 0.7 K/uL   Basophils Relative 1 %   Basophils Absolute 0.0 0.0 - 0.1 K/uL  Comprehensive metabolic panel     Status: Abnormal   Collection Time: 12/16/15  3:55 AM  Result Value Ref Range   Sodium 138 135 - 145 mmol/L   Potassium 3.3 (L) 3.5 - 5.1 mmol/L   Chloride 105 101 - 111 mmol/L   CO2 21 (L) 22 - 32 mmol/L   Glucose, Bld 155 (H) 65 - 99 mg/dL   BUN 21 (H) 6 - 20 mg/dL   Creatinine, Ser 1.02 (H) 0.44 - 1.00 mg/dL   Calcium 9.4 8.9 - 10.3 mg/dL   Total Protein 6.4 (L) 6.5 - 8.1 g/dL   Albumin 3.9 3.5 -  5.0 g/dL   AST 25 15 - 41 U/L   ALT 20 14 - 54 U/L   Alkaline Phosphatase 58 38 - 126 U/L   Total Bilirubin 0.8 0.3 - 1.2 mg/dL   GFR calc non Af Amer 58 (L) >60 mL/min   GFR calc Af Amer >60 >60 mL/min    Comment: (NOTE) The eGFR has been calculated using the CKD EPI equation. This calculation has not been validated in all clinical situations. eGFR's persistently <60 mL/min signify possible Chronic Kidney Disease.    Anion gap 12 5 - 15  Lipase, blood     Status: None  Collection Time: 12/16/15  3:55 AM  Result Value Ref Range   Lipase 35 11 - 51 U/L  MRSA PCR Screening     Status: None   Collection Time: 12/16/15  6:23 AM  Result Value Ref Range   MRSA by PCR NEGATIVE NEGATIVE    Comment:        The GeneXpert MRSA Assay (FDA approved for NASAL specimens only), is one component of a comprehensive MRSA colonization surveillance program. It is not intended to diagnose MRSA infection nor to guide or monitor treatment for MRSA infections.   Urinalysis, Routine w reflex microscopic (not at [facility])     Status: Abnormal   Collection Time: 12/16/15  3:20 PM  Result Value Ref Range   Color, Urine YELLOW YELLOW   APPearance CLEAR CLEAR   Specific Gravity, Urine 1.021 1.005 - 1.030   pH 6.0 5.0 - 8.0   Glucose, UA NEGATIVE NEGATIVE mg/dL   Hgb urine dipstick NEGATIVE NEGATIVE   Bilirubin Urine NEGATIVE NEGATIVE   Ketones, ur 15 (A) NEGATIVE mg/dL   Protein, ur NEGATIVE NEGATIVE mg/dL   Nitrite NEGATIVE NEGATIVE   Leukocytes, UA NEGATIVE NEGATIVE    Comment: MICROSCOPIC NOT DONE ON URINES WITH NEGATIVE PROTEIN, BLOOD, LEUKOCYTES, NITRITE, OR GLUCOSE <1000 mg/dL.    Ct Angio Head W/cm &/or Wo Cm  12/16/2015  ADDENDUM REPORT: 12/16/2015 17:59 ADDENDUM: These results were called by telephone at the time of interpretation on 12/16/2015 at 5:59 pm to Dr. Silverio Decamp, who verbally acknowledged these results. Electronically Signed   By: Franchot Gallo M.D.   On: 12/16/2015  17:59  12/16/2015  CLINICAL DATA:  Intraventricular hemorrhage EXAM: CT ANGIOGRAPHY HEAD TECHNIQUE: Multidetector CT imaging of the head was performed using the standard protocol during bolus administration of intravenous contrast. Multiplanar CT image reconstructions and MIPs were obtained to evaluate the vascular anatomy. CONTRAST:  50 mL Isovue 370 IV COMPARISON:  CT chest 12/16/2015 FINDINGS: CT HEAD Brain: Acute hemorrhage is present in the fourth ventricle, third ventral, and lateral ventricles similar in amount. There is also some blood around the foramen magnum in the subarachnoid space. Progressive hydrocephalus with interval enlargement of the ventricles and temporal horns. Calvarium and skull base: Negative Paranasal sinuses: Negative Orbits: Negative CTA HEAD Anterior circulation: Cavernous carotid widely patent bilaterally with mild atherosclerotic calcification. Anterior and middle cerebral arteries widely patent. No aneurysm in the anterior circulation. Posterior circulation: Both vertebral arteries patent to the basilar. PICA patent bilaterally. 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 This is most likely the cause of the intraventricular hemorrhage. This aneurysm is near the fourth ventricle. Basilar is widely patent. Superior cerebellar and posterior cerebral arteries widely patent bilaterally. No additional aneurysm in the posterior circulation. Venous sinuses: Patent Anatomic variants: None Delayed phase:Normal enhancement on delayed imaging IMPRESSION: Intraventricular hemorrhage is centered in the fourth ventricle and is unchanged from earlier today. 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 Electronically Signed: By: Franchot Gallo M.D. On: 12/16/2015 17:17   Ct Head Wo Contrast  12/16/2015  CLINICAL DATA:  Severe headache, nausea and vomiting, onset last  night. EXAM: CT HEAD WITHOUT CONTRAST TECHNIQUE: Contiguous axial images were obtained from the base of the skull through the vertex without intravenous contrast. COMPARISON:  Head CT 09/05/2007 FINDINGS: Acute hemorrhage in the posterior basilar cisterns, fourth, and third ventricle. There is mild symmetric extension into  the lateral ventricles. Ventricular size minimally increased from 2009, with prominence of the temporal horns. Hemorrhage causes mass effect on the brainstem. No evidence of acute ischemia, subdural collection or intracranial mass. The calvarium is intact. Included paranasal sinuses and mastoid air cells are well aerated. IMPRESSION: Acute hemorrhage in the basilar cisterns, third and fourth ventricle extending into the lateral ventricles. There is mass effect on the brainstem. 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 Critical Value/emergent results were called by telephone at the time of interpretation on 12/16/2015 at 4:30 am to Dr. Joseph Berkshire , who verbally acknowledged these results. Electronically Signed   By: Jeb Levering M.D.   On: 12/16/2015 04:31   Chest Port 1 View  12/16/2015  CLINICAL DATA:  CVA. EXAM: PORTABLE CHEST 1 VIEW COMPARISON:  05/10/15 chest radiograph. FINDINGS: Surgical clips overlie the lower cervical spine. Low lung volumes. Stable cardiomediastinal silhouette with normal heart size. No pneumothorax. No pleural effusion. Mild bibasilar atelectasis. No pulmonary edema. IMPRESSION: Low lung volumes with mild bibasilar atelectasis. Electronically Signed   By: Ilona Sorrel M.D.   On: 12/16/2015 07:58     Assessment/Plan: Patient who presented with headache and found to have intraventricular hemorrhage and small amount of subarachnoid hemorrhage. Patient admitted to the stroke neurology service early this morning. CT angiogram late this afternoon revealed a PICA aneurysm, and  neurosurgical consultation was requested by the stroke neurology service. Both her initial CT, as well as the CTA, showed evidence of hydrocephalus, the initial being mild, and the more recent CTA being moderate. Current modified Hunt and Hess grade 2-3.  I've spoken with our neurosurgical vascular specialist Dr.Nundkumar who is recommended that he proceed with diagnostic cerebral angiogram in the morning, and if feasible to proceed with endovascular coiling. If endovascular coiling is not feasible, he feels the patient would then require craniotomy for clipping of the aneurysm.  I spoke with the patient's husband at length, along with the neuro ICU nursing staff, discussing the nature of her condition, including the nature of cerebral aneurysms and aneurysmal hemorrhage including intraventricular hemorrhage and subarachnoid hemorrhage. We discussed the risks of aneurysms including re-hemorrhage, vasospasm, and hydrocephalus.  I explained that we may need to place an intraventricular catheter, potentially this evening. However I feel that for now we can monitor her. I explained that I discussed his wife's case with Dr. Kathyrn Sheriff, and that he has recommended proceeding with angiography and endovascular coiling if feasible and if not craniotomy for clipping of the aneurysm. He will be taking over primary neurosurgical responsibility in the morning. The patient's husband's questions were answered for him.  Orders placed to make patient nothing by mouth, and increase her IV fluids, change in her IV fluids to normal saline with 40 mEq KCl per liter. She is scheduled for bmet in a.m. Zofran IV has been ordered when necessary for nausea and vomiting. Because of urinary retention and the need to catheterize the patient for over 90 mL earlier today, we will have a Foley catheter placed.   Hosie Spangle, MD 12/16/2015, 7:18 PM

## 2015-12-16 NOTE — ED Notes (Signed)
Brought via EMS from home with c/o migraine that started last night.  Took Ibuproven 1800mg  total during the night.  Noted to be actively vomiting on arrival and very diaphoretic.  CBG en route 130.

## 2015-12-16 NOTE — Progress Notes (Signed)
CT/CTA report noted.  Pt with slightly diminished responsiveness & mental acuity; pupils increased from 34mm to 43mm but equal & reactive bilaterally.   Dr. Silverio Decamp notified.

## 2015-12-16 NOTE — ED Notes (Signed)
Pt continues to vomit; no improvement with compazine.
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
## 2015-12-16 NOTE — Progress Notes (Signed)
Intracerebral Hemorrhage (ICH) Score  Glascow Coma Score  13-15 0  Age >/= 80  no 0  ICH volume >/= 92ml  no 0  IVH yes +1  Infratentorial origin yes +1 Total:  2  [name],[name]  [place] for Pager information 12/16/2015 9:21 AM

## 2015-12-16 NOTE — ED Provider Notes (Signed)
CSN: [id number]     Arrival date & time 12/16/15  [id number] History  By signing my name below, I, Stephania Fragmin, attest that this documentation has been prepared under the direction and in the presence of Orpah Greek, MD. Electronically Signed: Stephania Fragmin, ED Scribe. 12/16/2015. 3:51 AM.   Chief Complaint  Patient presents with  . Migraine  . Emesis   The history is provided by the patient and the EMS personnel. No language interpreter was used.   HPI Comments: Kelly Wilson is a 60 y.o. female with a history of HTN, HLD, an d tIA, brought in by ambulance, who presents to the Emergency Department complaining of a constant, sharp, 10/10 generalized headache, with the greatest amount of pain in her posterior head, that began last night. She also complains of associated photophobia, nausea, and vomiting. Per EMS, patient stated she has never had a CVA but has a history of tIA. Movement exacerbates her pain. She states she had tried taking ibuprofen, with only some relief. Patient reports her hypertension is typically well-controlled. Per EMS, CBG en route was 130.    Past Medical History  Diagnosis Date  . Hyperlipidemia   . Hypertension   . 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 HYPERLIPIDEMIA 05/17/2007  . HYPERTENSION 05/17/2007  . GERD 08/09/2008  . OSTEOARTHRITIS 05/17/2007  . SPINAL STENOSIS 05/17/2007  . TRANSIENT ISCHEMIC ATTACK, HX OF 05/17/2007  . Left ear hearing loss     Mild   Past Surgical History  Procedure Laterality Date  . Abdominal hysterectomy  1995  . Tubal ligation    . External ear surgery      left side x's 2  . Stapedectomy    . Throidectomy    . Orif elbow fracture  02/2011  . Skin graft full thickness arm    . Skin graft full thickness leg    . Cesarean section  1993  . Ankle surgery  2005  . Thyroidectomy, partial Right 1980  . Knee surgery  02/2011   Family History  Problem Relation Age of Onset  . Coronary artery disease Mother   . Diabetes  Mother   . Coronary artery disease Father   . Diabetes Sister     2 sister with DM  . Cancer Other     uncle had rectal cancer  . Hypertension Other   . Hyperlipidemia Other    Social History  Substance Use Topics  . Smoking status: Never Smoker   . Smokeless tobacco: None     Comment: Married, she is a Cabin crew  . Alcohol Use: No   OB History    No data available     Review of Systems  Eyes: Positive for photophobia.  Gastrointestinal: Positive for nausea and vomiting.  Neurological: Positive for headaches.  All other systems reviewed and are negative.     Allergies  Morphine and Rosuvastatin  Home Medications   Prior to Admission medications   Medication Sig Start Date End Date Taking? Authorizing Provider  aspirin 325 MG tablet Take 325 mg by mouth daily.   Yes Historical Provider, MD  gabapentin (NEURONTIN) 300 MG capsule Take 3 capsules (900 mg total) by mouth 3 (three) times daily. 11/05/15  Yes Ward Givens, NP  magnesium gluconate (MAGONATE) 500 MG tablet Take 1 tablet (500 mg total) by mouth daily. 05/10/15  Yes Geradine Girt, DO  omeprazole (PRILOSEC) 40 MG capsule Take 40 mg by mouth daily.     Yes Historical Provider, MD  triamterene-hydrochlorothiazide (MAXZIDE-25) 37.5-25 MG per tablet Take 1 tablet by mouth daily. 01/28/15  Yes [name], MD   BP 150/88 mmHg  Pulse 66  Temp(Src) 97.4 F (36.3 C) (Oral)  Resp 16  Ht 5\' 6"  (1.676 m)  Wt 240 lb (108.863 kg)  BMI 38.76 kg/m2  SpO2 95% Physical Exam  Constitutional: She is oriented to person, place, and time. She appears well-developed and well-nourished. No distress.  Diaphoretic.  HENT:  Head: Normocephalic and atraumatic.  Right Ear: Hearing normal.  Left Ear: Hearing normal.  Nose: Nose normal.  Mouth/Throat: Oropharynx is clear and moist and mucous membranes are normal.  Eyes: Conjunctivae and EOM are normal. Pupils are equal, round, and reactive to light.  Neck: Normal range of motion.  Neck supple.  Cardiovascular: Regular rhythm, S1 normal and S2 normal.  Exam reveals no gallop and no friction rub.   No murmur heard. Pulmonary/Chest: Effort normal and breath sounds normal. No respiratory distress. She exhibits no tenderness.  Abdominal: Soft. Normal appearance and bowel sounds are normal. There is no hepatosplenomegaly. There is no tenderness. There is no rebound, no guarding, no tenderness at McBurney's point and negative Murphy's sign. No hernia.  Musculoskeletal: Normal range of motion.  Neurological: She is alert and oriented to person, place, and time. She has normal strength. No cranial nerve deficit or sensory deficit. Coordination normal. GCS eye subscore is 4. GCS verbal subscore is 5. GCS motor subscore is 6.  Skin: Skin is warm and intact. No rash noted. She is diaphoretic. No cyanosis.  Psychiatric: She has a normal mood and affect. Her speech is normal and behavior is normal. Thought content normal.  Nursing note and vitals reviewed.   ED Course  Procedures (including critical care time)  COORDINATION OF CARE: 3:49 AM - Discussed treatment plan with pt at bedside. Pt verbalized understanding and agreed to plan.   Labs Review Labs Reviewed  CBC WITH DIFFERENTIAL/PLATELET - Abnormal; Notable for the following:    RBC 5.46 (*)    All other components within normal limits  COMPREHENSIVE METABOLIC PANEL - Abnormal; Notable for the following:    Potassium 3.3 (*)    CO2 21 (*)    Glucose, Bld 155 (*)    BUN 21 (*)    Creatinine, Ser 1.02 (*)    Total Protein 6.4 (*)    GFR calc non Af Amer 58 (*)    All other components within normal limits  LIPASE, BLOOD  URINALYSIS, ROUTINE W REFLEX MICROSCOPIC (NOT AT [facility])    Imaging Review Ct Head Wo Contrast  12/16/2015  CLINICAL DATA:  Severe headache, nausea and vomiting, onset last night. EXAM: CT HEAD WITHOUT CONTRAST TECHNIQUE: Contiguous axial images were obtained from the base of the skull through the  vertex without intravenous contrast. COMPARISON:  Head CT 09/05/2007 FINDINGS: Acute hemorrhage in the posterior basilar cisterns, fourth, and third ventricle. There is mild symmetric extension into the lateral ventricles. Ventricular size minimally increased from 2009, with prominence of the temporal horns. Hemorrhage causes mass effect on the brainstem. No evidence of acute ischemia, subdural collection or intracranial mass. The calvarium is intact. Included paranasal sinuses and mastoid air cells are well aerated. IMPRESSION: Acute hemorrhage in the basilar cisterns, third and fourth ventricle extending into the lateral ventricles. There is mass effect on the brainstem. 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 Critical Value/emergent results were called by telephone at the time of interpretation  on 12/16/2015 at 4:30 am to Dr. Joseph Berkshire , who verbally acknowledged these results. Electronically Signed   By: Jeb Levering M.D.   On: 12/16/2015 04:31   I have personally reviewed and evaluated these images and lab results as part of my medical decision-making.   EKG Interpretation None      MDM   Final diagnoses:  Intraventricular hemorrhage [facility])   Patient presents to the emergency part for evaluation of headache. Patient reports that symptoms began earlier this evening. She reports severe throbbing pain at the base of her head and the top of her head. She has had associated nausea and vomiting. She has taken ibuprofen twice without improvement of her headache. Patient reports a distant history of migraines.  Patient did have normal neurologic function upon arrival to the ER. She did appear to be in some distress, however. She was diaphoretic and actively retching and vomiting. Patient sent for CT of head to further evaluate unusual headache and was found to have intraventricular hemorrhage with mass effect on the  brainstem.  Patient was hypertensive at arrival. She was given a small dose of labetalol blood pressure is improving.  Repeat evaluation after CT findings were available reveals that she is alert, oriented, still without any neurologic deficit.  CT findings discussed with Dr. Nicole Kindred, will see the patient for admission.  CRITICAL CARE Performed by: Orpah Greek   Total critical care time: 30 minutes  Critical care time was exclusive of separately billable procedures and treating other patients.  Critical care was necessary to treat or prevent imminent or life-threatening deterioration.  Critical care was time spent personally by me on the following activities: development of treatment plan with patient and/or surrogate as well as nursing, discussions with consultants, evaluation of patient's response to treatment, examination of patient, obtaining history from patient or surrogate, ordering and performing treatments and interventions, ordering and review of laboratory studies, ordering and review of radiographic studies, pulse oximetry and re-evaluation of patient's condition. ca  I personally performed the services described in this documentation, which was scribed in my presence. The recorded information has been reviewed and is accurate.     Orpah Greek, MD 12/16/15 [phone]

## 2015-12-16 NOTE — ED Notes (Signed)
O2 sats noted to drop into 80's with [place].  Noted to be a mouth breather.  Placed on nonrebreather.  Sats immediately rebounded to upper 90's.

## 2015-12-16 NOTE — Care Management Note (Signed)
Patient with primary intraventricular hemorrhage. 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 There is also mild worsening of the ventriculomegaly especially bilateral temporal horns of lateral ventricles and third ventricle.   Briefly examined the patient, she appears slightly drowsy but easily arousable to verbal commands, follows commands appropriately, oriented x 4, normal speech, symmetric strength in all extremities, no abnormal involuntary movements or gross limb ataxia noted. No cranial nerve deficits noted.   Blood pressure was high at [id number] systolic. Recommend starting nicardipine drip to goal systolic blood pressure of 120-140.  Consulted neurosurgery for further evaluation and management of aneurysm as well as for monitoring of the hydrocephalus.   Discussed on phone with Dr. Leonie Man, who is the primary neurologist following her.  Discussed about results of the CT angiogram with the patient and her husband, and plan for neurosurgical consultation.

## 2015-12-16 NOTE — ED Notes (Signed)
Neurologist at bedside. 

## 2015-12-16 NOTE — Progress Notes (Signed)
STROKE TEAM PROGRESS NOTE   HISTORY OF PRESENT ILLNESS (per record) Kelly Wilson is an 60 y.o. female history of hypertension and hyperlipidemia, as well as TIA, presenting with new onset headache with nausea and mild vertigo starting at 6 PM 12/15/2015 ([facility]). She has a remote history of migraine headaches. This headache is different from any that she has previously experienced. CT scan of her head showed acute hemorrhage involving basilar cisterns, third and fourth ventricles and extending into lateral ventricles. Mass effect on brainstem was noted. Early hydrocephalus cannot be ruled out. Patient has not experienced a change in speech nor facial droop. She also has not had experienced weakness no numbness involving extremities. Vision has remained unchanged. NIH stroke score at the time of this evaluation was 0. Patient was not administered IV t-PA secondary to IVH. She was admitted for further evaluation and treatment.   SUBJECTIVE (INTERVAL HISTORY) Patient complains of headache. She denies nausea dizziness or vertigo.  She has seen in the past for myalgias and arthralgias.  Her blood pressure appears adequately controlled today she has no new complaints.   OBJECTIVE Temp:  [97.4 F (36.3 C)] 97.4 F (36.3 C) (05/29 0346) Pulse Rate:  [64-77] 77 (05/29 0900) Cardiac Rhythm:  [-] Normal sinus rhythm (05/29 0620) Resp:  [12-21] 15 (05/29 0900) BP: (137-171)/(64-94) 161/72 mmHg (05/29 0900) SpO2:  [92 %-100 %] 99 % (05/29 0900) Weight:  [108.863 kg (240 lb)] 108.863 kg (240 lb) (05/29 0620)  CBC:  Recent Labs Lab 12/16/15 0355  WBC 7.1  NEUTROABS 2.6  HGB 15.0  HCT 44.6  MCV 81.7  PLT [id number]    Basic Metabolic Panel:  Recent Labs Lab 12/16/15 0355  NA 138  K 3.3*  CL 105  CO2 21*  GLUCOSE 155*  BUN 21*  CREATININE 1.02*  CALCIUM 9.4    Lipid Panel:    Component Value Date/Time   CHOL 263* 05/10/2015 0830   TRIG 83 05/10/2015 0830   HDL 76 05/10/2015 0830   CHOLHDL 3.5 05/10/2015 0830   VLDL 17 05/10/2015 0830   LDLCALC 170* 05/10/2015 0830   HgbA1c:  Lab Results  Component Value Date   HGBA1C 5.0 01/24/2015   Urine Drug Screen: No results found for: LABOPIA, COCAINSCRNUR, LABBENZ, AMPHETMU, THCU, LABBARB    IMAGING  Ct Head Wo Contrast  12/16/2015  CLINICAL DATA:  Severe headache, nausea and vomiting, onset last night. EXAM: CT HEAD WITHOUT CONTRAST TECHNIQUE: Contiguous axial images were obtained from the base of the skull through the vertex without intravenous contrast. COMPARISON:  Head CT 09/05/2007 FINDINGS: Acute hemorrhage in the posterior basilar cisterns, fourth, and third ventricle. There is mild symmetric extension into the lateral ventricles. Ventricular size minimally increased from 2009, with prominence of the temporal horns. Hemorrhage causes mass effect on the brainstem. No evidence of acute ischemia, subdural collection or intracranial mass. The calvarium is intact. Included paranasal sinuses and mastoid air cells are well aerated. IMPRESSION: Acute hemorrhage in the basilar cisterns, third and fourth ventricle extending into the lateral ventricles. There is mass effect on the brainstem. 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 Critical Value/emergent results were called by telephone at the time of interpretation on 12/16/2015 at 4:30 am to Dr. Joseph Berkshire , who verbally acknowledged these results. Electronically Signed   By: Jeb Levering M.D.   On: 12/16/2015 04:31   Chest Port 1 View  12/16/2015  CLINICAL DATA:  CVA. EXAM:  PORTABLE CHEST 1 VIEW COMPARISON:  05/10/15 chest radiograph. FINDINGS: Surgical clips overlie the lower cervical spine. Low lung volumes. Stable cardiomediastinal silhouette with normal heart size. No pneumothorax. No pleural effusion. Mild bibasilar atelectasis. No pulmonary edema. IMPRESSION: Low lung volumes with mild bibasilar  atelectasis. Electronically Signed   By: Ilona Sorrel M.D.   On: 12/16/2015 07:58     PHYSICAL EXAM [name] currently not in distress. . Afebrile. Head is nontraumatic. Neck is supple without bruit.    Cardiac exam no murmur or gallop. Lungs are clear to auscultation. Distal pulses are well felt. Neurological Exam ;  Awake  Alert oriented x 3. Normal speech and language.eye movements full without nystagmus.fundi were not visualized. Vision acuity and fields appear normal. Hearing is normal. Palatal movements are normal. Face symmetric. Tongue midline. Normal strength, tone, reflexes and coordination. Normal sensation. Gait deferred. ASSESSMENT/PLAN Kelly Wilson is a 60 y.o. female with history of HTN, HLD and TIA presenting with new onset headache with nausea and mild vertigo. CT showed acute IVH.  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 mass effect on the brainstem. 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  CT angio head pending  No MRI d/t hx stapedectomy  Carotid Doppler  pending   2D Echo  pending  LDL 170 mg%  Hold statin due to acute ICH  HgbA1c pending  SCDs for VTE prophylaxis  Diet NPO time specified  aspirin 325 mg daily prior to admission  Ongoing aggressive stroke risk factor management  Therapy recommendations:  pending   Disposition:  pending   Hypertensive Emergency  BP 171/78 on arrival to ED in setting of neurologic symptoms  SBP goal < 160  Received 5 mg labetolol at 505a and remains elevated  Continued to monitor and treat  Home meds: triamterene-HCTZ 37.5/25  Hyperglycemia  Gluc 155  HgbA1c pending   Hyperlipidemia  Home meds:  No  statin  LDL 170 mg%  Other Stroke Risk Factors  Obesity, Body mass index is 38.76 kg/(m^2).   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 Replace with 2 runs. Recheck in am  Hospital day # 0  I have personally examined this patient, reviewed notes, independently viewed imaging studies, participated in medical decision making and plan of care. I have made any additions or clarifications directly to the above note. She presented with acute onset of headache and nausea and dizziness secondary  To intraventricular hemorrhage  With slightly elevated blood pressure. Etiology of the hemorrhage was indeterminate but could be hypertensive. She remains at significant risk for neurological worsening,, hematoma expansion, hydrocephalus and needs ongoing  Stroke blood pressure control and close neurological monitoring.  I had a long discussion with the patient and her husband at the bedside and answered questions. Plan to check CT angiogram of the brain later today and follow-up CT scan tomorrow morning. Tight blood pressure control Hold antiplatelet agents. This patient is critically ill and at significant risk of neurological worsening, death and care requires constant monitoring of vital signs, hemodynamics,respiratory and cardiac monitoring, extensive review of multiple databases, frequent neurological assessment, discussion with family, other specialists and medical decision making of  high complexity.I have made any additions or clarifications directly to the above note.This critical care time does not reflect procedure time, or teaching time or supervisory time of PA/NP/Med Resident etc but could involve care discussion time.  I spent 40 minutes of neurocritical care time  in the care of  this patient.    Antony Contras, MD Medical Director [facility] Pager: [phone] 12/16/2015 2:26  PM     To contact Stroke Continuity provider, please refer to [email or web address] After hours, contact General Neurology

## 2015-12-17 ENCOUNTER — Encounter (HOSPITAL_COMMUNITY)
Admission: EM | Disposition: A | Discharge status: Rehabilitation Facility | Payer: Self-pay | Source: Home / Self Care | Attending: Neurology

## 2015-12-17 ENCOUNTER — Inpatient Hospital Stay (HOSPITAL_COMMUNITY): Payer: BLUE CROSS/BLUE SHIELD | Admitting: Anesthesiology

## 2015-12-17 ENCOUNTER — Encounter (HOSPITAL_COMMUNITY): Payer: Self-pay | Admitting: Anesthesiology

## 2015-12-17 ENCOUNTER — Inpatient Hospital Stay (HOSPITAL_COMMUNITY): Payer: BLUE CROSS/BLUE SHIELD

## 2015-12-17 ENCOUNTER — Encounter (HOSPITAL_COMMUNITY): Payer: BLUE CROSS/BLUE SHIELD

## 2015-12-17 DIAGNOSIS — R51 Headache: Secondary | ICD-10-CM

## 2015-12-17 DIAGNOSIS — R519 Headache, unspecified: Secondary | ICD-10-CM

## 2015-12-17 DIAGNOSIS — I607 Nontraumatic subarachnoid hemorrhage from unspecified intracranial artery: Secondary | ICD-10-CM

## 2015-12-17 DIAGNOSIS — G911 Obstructive hydrocephalus: Secondary | ICD-10-CM

## 2015-12-17 HISTORY — PX: CRANIOTOMY: SHX93

## 2015-12-17 LAB — BASIC METABOLIC PANEL
Anion gap: 9 (ref 5–15)
BUN: 15 mg/dL (ref 6–20)
CO2: 22 mmol/L (ref 22–32)
CREATININE: 0.77 mg/dL (ref 0.44–1.00)
Calcium: 9.2 mg/dL (ref 8.9–10.3)
Chloride: 106 mmol/L (ref 101–111)
Glucose, Bld: 158 mg/dL — ABNORMAL HIGH (ref 65–99)
Potassium: 3.7 mmol/L (ref 3.5–5.1)
SODIUM: 137 mmol/L (ref 135–145)

## 2015-12-17 LAB — COMPREHENSIVE METABOLIC PANEL
ALBUMIN: 3.3 g/dL — AB (ref 3.5–5.0)
ALT: 17 U/L (ref 14–54)
ANION GAP: 6 (ref 5–15)
AST: 25 U/L (ref 15–41)
Alkaline Phosphatase: 46 U/L (ref 38–126)
BILIRUBIN TOTAL: 0.5 mg/dL (ref 0.3–1.2)
BUN: 17 mg/dL (ref 6–20)
CALCIUM: 8.3 mg/dL — AB (ref 8.9–10.3)
CO2: 21 mmol/L — ABNORMAL LOW (ref 22–32)
CREATININE: 0.81 mg/dL (ref 0.44–1.00)
Chloride: 112 mmol/L — ABNORMAL HIGH (ref 101–111)
GFR calc non Af Amer: >60 mL/min (ref >60)
GLUCOSE: 152 mg/dL — AB (ref 65–99)
POTASSIUM: 3.7 mmol/L (ref 3.5–5.1)
SODIUM: 139 mmol/L (ref 135–145)
TOTAL PROTEIN: 5.7 g/dL — AB (ref 6.5–8.1)

## 2015-12-17 LAB — TYPE AND SCREEN
ABO/RH(D): O NEG
ANTIBODY SCREEN: NEGATIVE

## 2015-12-17 LAB — CBC
HCT: 40.2 % (ref 36.0–46.0)
HEMOGLOBIN: 13.1 g/dL (ref 12.0–15.0)
MCH: 27.1 pg (ref 26.0–34.0)
MCHC: 32.6 g/dL (ref 30.0–36.0)
MCV: 83.1 fL (ref 78.0–100.0)
PLATELETS: 265 10*3/uL (ref 150–400)
RBC: 4.84 MIL/uL (ref 3.87–5.11)
RDW: 14.3 % (ref 11.5–15.5)
WBC: 16.4 10*3/uL — AB (ref 4.0–10.5)

## 2015-12-17 LAB — GLUCOSE, CAPILLARY
Glucose-Capillary: 156 mg/dL — ABNORMAL HIGH (ref 65–99)
Glucose-Capillary: 136 mg/dL — ABNORMAL HIGH (ref 65–99)

## 2015-12-17 LAB — ABO/RH: ABO/RH(D): O NEG

## 2015-12-17 SURGERY — CRANIOTOMY, FOR VERTEBRAL/BASILAR ARTERY ANEURYSM REPAIR
Anesthesia: General | Site: Head | Laterality: Right

## 2015-12-17 MED ORDER — SODIUM CHLORIDE 0.9 % IV SOLN
1000.0000 mg | Freq: Once | INTRAVENOUS | Status: AC
  Administered 2015-12-17: 1000 mg via INTRAVENOUS
  Filled 2015-12-17: qty 10

## 2015-12-17 MED ORDER — LIDOCAINE HCL (CARDIAC) 20 MG/ML IV SOLN
INTRAVENOUS | Status: DC | PRN
Start: 2015-12-17 — End: 2015-12-17
  Administered 2015-12-17: 80 mg via INTRAVENOUS

## 2015-12-17 MED ORDER — BACITRACIN ZINC 500 UNIT/GM EX OINT
TOPICAL_OINTMENT | CUTANEOUS | Status: DC | PRN
  Administered 2015-12-17: 1 via TOPICAL

## 2015-12-17 MED ORDER — DEXTROSE 5 % IV SOLN
10.0000 mg | INTRAVENOUS | Status: DC | PRN
  Administered 2015-12-17: 10 ug/min via INTRAVENOUS
  Administered 2015-12-17: 20 ug/min via INTRAVENOUS

## 2015-12-17 MED ORDER — ONDANSETRON HCL 4 MG/2ML IJ SOLN
INTRAMUSCULAR | Status: AC
  Filled 2015-12-17: qty 2

## 2015-12-17 MED ORDER — SUGAMMADEX SODIUM 200 MG/2ML IV SOLN
INTRAVENOUS | Status: DC | PRN
  Administered 2015-12-17: 500 mg via INTRAVENOUS

## 2015-12-17 MED ORDER — DEXMEDETOMIDINE HCL IN NACL 200 MCG/50ML IV SOLN
INTRAVENOUS | Status: AC
  Filled 2015-12-17: qty 50

## 2015-12-17 MED ORDER — SODIUM CHLORIDE 0.9 % IV SOLN
1000.0000 mg | INTRAVENOUS | Status: AC
  Administered 2015-12-17: 1000 mg via INTRAVENOUS
  Filled 2015-12-17: qty 10

## 2015-12-17 MED ORDER — SUCCINYLCHOLINE CHLORIDE 20 MG/ML IJ SOLN
INTRAMUSCULAR | Status: DC | PRN
  Administered 2015-12-17: 120 mg via INTRAVENOUS

## 2015-12-17 MED ORDER — ONDANSETRON HCL 4 MG/2ML IJ SOLN
INTRAMUSCULAR | Status: DC | PRN
  Administered 2015-12-17 (×2): 4 mg via INTRAVENOUS

## 2015-12-17 MED ORDER — INDOCYANINE GREEN 25 MG IV SOLR
INTRAVENOUS | Status: DC | PRN
  Administered 2015-12-17: 12.5 mg via INTRAVENOUS

## 2015-12-17 MED ORDER — PROPOFOL 10 MG/ML IV BOLUS
INTRAVENOUS | Status: AC
  Filled 2015-12-17: qty 40

## 2015-12-17 MED ORDER — FENTANYL CITRATE (PF) 100 MCG/2ML IJ SOLN: 25.0000 ug | INTRAMUSCULAR | Status: DC | PRN

## 2015-12-17 MED ORDER — 0.9 % SODIUM CHLORIDE (POUR BTL) OPTIME
TOPICAL | Status: DC | PRN
  Administered 2015-12-17 (×3): 1000 mL

## 2015-12-17 MED ORDER — NIMODIPINE 60 MG/20ML PO SOLN
60.0000 mg | ORAL | Status: DC
Start: 2015-12-17 — End: 2015-12-27
  Administered 2015-12-17 – 2015-12-25 (×27): 60 mg
  Filled 2015-12-17 (×46): qty 20

## 2015-12-17 MED ORDER — MICROFIBRILLAR COLL HEMOSTAT EX PADS
MEDICATED_PAD | CUTANEOUS | Status: DC | PRN
  Administered 2015-12-17: 1 via TOPICAL

## 2015-12-17 MED ORDER — PHENYLEPHRINE HCL 10 MG/ML IJ SOLN
INTRAMUSCULAR | Status: DC | PRN
  Administered 2015-12-17: 40 ug via INTRAVENOUS
  Administered 2015-12-17 (×3): 120 ug via INTRAVENOUS

## 2015-12-17 MED ORDER — SODIUM CHLORIDE 0.9 % IV SOLN
INTRAVENOUS | Status: DC | PRN
  Administered 2015-12-17: 13:00:00 via INTRAVENOUS

## 2015-12-17 MED ORDER — NALOXONE HCL 0.4 MG/ML IJ SOLN
INTRAMUSCULAR | Status: AC
  Filled 2015-12-17: qty 1

## 2015-12-17 MED ORDER — FENTANYL CITRATE (PF) 250 MCG/5ML IJ SOLN
INTRAMUSCULAR | Status: DC | PRN
Start: 2015-12-17 — End: 2015-12-17
  Administered 2015-12-17 (×3): 100 ug via INTRAVENOUS
  Administered 2015-12-17: 50 ug via INTRAVENOUS

## 2015-12-17 MED ORDER — ONDANSETRON HCL 4 MG/2ML IJ SOLN: 4.0000 mg | Freq: Once | INTRAMUSCULAR | Status: DC | PRN

## 2015-12-17 MED ORDER — SUGAMMADEX SODIUM 500 MG/5ML IV SOLN
INTRAVENOUS | Status: AC
  Filled 2015-12-17: qty 5

## 2015-12-17 MED ORDER — ROCURONIUM BROMIDE 50 MG/5ML IV SOLN
INTRAVENOUS | Status: AC
  Filled 2015-12-17: qty 2

## 2015-12-17 MED ORDER — FENTANYL CITRATE (PF) 250 MCG/5ML IJ SOLN
INTRAMUSCULAR | Status: AC
  Filled 2015-12-17: qty 5

## 2015-12-17 MED ORDER — NIMODIPINE 30 MG PO CAPS
60.0000 mg | ORAL_CAPSULE | ORAL | Status: DC
Start: 2015-12-17 — End: 2015-12-27
  Administered 2015-12-17 – 2015-12-27 (×31): 60 mg via ORAL
  Filled 2015-12-17 (×34): qty 2

## 2015-12-17 MED ORDER — SODIUM CHLORIDE 0.9 % IV SOLN: Freq: Once | INTRAVENOUS | Status: DC

## 2015-12-17 MED ORDER — THROMBIN 5000 UNITS EX SOLR
CUTANEOUS | Status: DC | PRN
  Administered 2015-12-17: 5 mL via TOPICAL

## 2015-12-17 MED ORDER — MEPERIDINE HCL 25 MG/ML IJ SOLN: 6.2500 mg | INTRAMUSCULAR | Status: DC | PRN

## 2015-12-17 MED ORDER — DEXMEDETOMIDINE HCL IN NACL 200 MCG/50ML IV SOLN
INTRAVENOUS | Status: DC | PRN
  Administered 2015-12-17: .2 ug/kg/h via INTRAVENOUS

## 2015-12-17 MED ORDER — THROMBIN 20000 UNITS EX SOLR
CUTANEOUS | Status: DC | PRN
  Administered 2015-12-17: 20 mL via TOPICAL

## 2015-12-17 MED ORDER — DEXAMETHASONE SODIUM PHOSPHATE 4 MG/ML IJ SOLN
INTRAMUSCULAR | Status: DC | PRN
  Administered 2015-12-17: 10 mg via INTRAVENOUS

## 2015-12-17 MED ORDER — CEFAZOLIN SODIUM-DEXTROSE 2-3 GM-% IV SOLR
INTRAVENOUS | Status: DC | PRN
  Administered 2015-12-17: 2 g via INTRAVENOUS

## 2015-12-17 MED ORDER — SODIUM CHLORIDE 0.9 % IV SOLN
500.0000 mg | Freq: Two times a day (BID) | INTRAVENOUS | Status: DC
  Administered 2015-12-18 – 2015-12-19 (×4): 500 mg via INTRAVENOUS
  Filled 2015-12-17 (×5): qty 5

## 2015-12-17 MED ORDER — MIDAZOLAM HCL 2 MG/2ML IJ SOLN
INTRAMUSCULAR | Status: AC
  Filled 2015-12-17: qty 2

## 2015-12-17 MED ORDER — PHENYLEPHRINE 40 MCG/ML (10ML) SYRINGE FOR IV PUSH (FOR BLOOD PRESSURE SUPPORT)
PREFILLED_SYRINGE | INTRAVENOUS | Status: AC
  Filled 2015-12-17: qty 10

## 2015-12-17 MED ORDER — ROCURONIUM BROMIDE 100 MG/10ML IV SOLN
INTRAVENOUS | Status: DC | PRN
  Administered 2015-12-17 (×3): 50 mg via INTRAVENOUS

## 2015-12-17 MED ORDER — DEXAMETHASONE SODIUM PHOSPHATE 10 MG/ML IJ SOLN
INTRAMUSCULAR | Status: AC
  Filled 2015-12-17: qty 1

## 2015-12-17 MED ORDER — SODIUM CHLORIDE 0.9 % IV SOLN
INTRAVENOUS | Status: DC | PRN
  Administered 2015-12-17 (×2): via INTRAVENOUS

## 2015-12-17 MED ORDER — NALOXONE HCL 0.4 MG/ML IJ SOLN
0.2000 mg | INTRAMUSCULAR | Status: AC
  Administered 2015-12-17: 0.2 mg via INTRAVENOUS

## 2015-12-17 MED ORDER — ARTIFICIAL TEARS OP OINT
TOPICAL_OINTMENT | OPHTHALMIC | Status: AC
  Filled 2015-12-17: qty 3.5

## 2015-12-17 MED ORDER — LIDOCAINE 2% (20 MG/ML) 5 ML SYRINGE
INTRAMUSCULAR | Status: AC
  Filled 2015-12-17: qty 5

## 2015-12-17 MED ORDER — LIDOCAINE HCL (PF) 1 % IJ SOLN
INTRAMUSCULAR | Status: DC | PRN
  Administered 2015-12-17: 9 mL

## 2015-12-17 SURGICAL SUPPLY — 97 items
BANDAGE GAUZE 4  KLING STR (GAUZE/BANDAGES/DRESSINGS) IMPLANT
BENZOIN TINCTURE PRP APPL 2/3 (GAUZE/BANDAGES/DRESSINGS) IMPLANT
BIT DRILL WIRE PASS 1.3MM (BIT) IMPLANT
BLADE SAW GIGLI 16 STRL (MISCELLANEOUS) IMPLANT
BLADE SURG 15 STRL LF DISP TIS (BLADE) ×1 IMPLANT
BLADE SURG 15 STRL SS (BLADE) ×1
BLADE ULTRA TIP 2M (BLADE) IMPLANT
BNDG GAUZE ELAST 4 BULKY (GAUZE/BANDAGES/DRESSINGS) IMPLANT
BRUSH SCRUB EZ 1% IODOPHOR (MISCELLANEOUS) IMPLANT
BRUSH SCRUB EZ PLAIN DRY (MISCELLANEOUS) IMPLANT
BUR ACORN 6.0 PRECISION (BURR) IMPLANT
BUR ADDG 1.1 (BURR) IMPLANT
BUR MATCHSTICK NEURO 3.0 LAGG (BURR) IMPLANT
BUR RND OSTEON ELITE 6.0 (BURR) ×2 IMPLANT
BUR ROUND FLUTED 4 SOFT TCH (BURR) IMPLANT
BUR SPIRAL ROUTER 2.3 (BUR) IMPLANT
CANISTER SUCT 3000ML PPV (MISCELLANEOUS) ×2 IMPLANT
CLIP ANEURY PERM STD 8.3MM (Clip) ×2 IMPLANT
CLIP ANEURY TI PERM MINI 6.6M (Clip) ×2 IMPLANT
CLIP TI MEDIUM 6 (CLIP) IMPLANT
COVER MAYO STAND STRL (DRAPES) IMPLANT
DECANTER SPIKE VIAL GLASS SM (MISCELLANEOUS) ×2 IMPLANT
DERMABOND ADVANCED (GAUZE/BANDAGES/DRESSINGS) ×1
DERMABOND ADVANCED .7 DNX12 (GAUZE/BANDAGES/DRESSINGS) ×1 IMPLANT
DRAIN SNY WOU 7FLT (WOUND CARE) IMPLANT
DRAPE LAPAROTOMY 100X72 PEDS (DRAPES) ×2 IMPLANT
DRAPE MICROSCOPE LEICA (MISCELLANEOUS) IMPLANT
DRAPE NEUROLOGICAL W/INCISE (DRAPES) IMPLANT
DRAPE WARM FLUID 44X44 (DRAPE) ×2 IMPLANT
DRILL WIRE PASS 1.3MM (BIT)
DRSG ADAPTIC 3X8 NADH LF (GAUZE/BANDAGES/DRESSINGS) ×2 IMPLANT
DRSG OPSITE 4X5.5 SM (GAUZE/BANDAGES/DRESSINGS) ×4 IMPLANT
DRSG OPSITE POSTOP 4X6 (GAUZE/BANDAGES/DRESSINGS) ×2 IMPLANT
DRSG TELFA 3X8 NADH (GAUZE/BANDAGES/DRESSINGS) IMPLANT
DURAMATRIX ONLAY 3X3 (Plate) ×4 IMPLANT
DURAPREP 26ML APPLICATOR (WOUND CARE) ×2 IMPLANT
DURAPREP 6ML APPLICATOR 50/CS (WOUND CARE) ×2 IMPLANT
DURASEAL APPLICATOR TIP (TIP) ×2 IMPLANT
DURASEAL SPINE SEALANT 3ML (MISCELLANEOUS) ×2 IMPLANT
ELECT REM PT RETURN 9FT ADLT (ELECTROSURGICAL) ×2
ELECTRODE REM PT RTRN 9FT ADLT (ELECTROSURGICAL) ×1 IMPLANT
EVACUATOR SILICONE 100CC (DRAIN) IMPLANT
FORCEPS BIPOLAR SPETZLER 8 1.0 (NEUROSURGERY SUPPLIES) ×2 IMPLANT
GAUZE SPONGE 4X4 12PLY STRL (GAUZE/BANDAGES/DRESSINGS) IMPLANT
GAUZE SPONGE 4X4 16PLY XRAY LF (GAUZE/BANDAGES/DRESSINGS) IMPLANT
GLOVE BIO SURGEON STRL SZ7 (GLOVE) ×2 IMPLANT
GLOVE BIOGEL PI IND STRL 7.5 (GLOVE) ×1 IMPLANT
GLOVE BIOGEL PI IND STRL 8 (GLOVE) ×2 IMPLANT
GLOVE BIOGEL PI INDICATOR 7.5 (GLOVE) ×1
GLOVE BIOGEL PI INDICATOR 8 (GLOVE) ×2
GLOVE ECLIPSE 6.5 STRL STRAW (GLOVE) ×2 IMPLANT
GLOVE ECLIPSE 7.0 STRL STRAW (GLOVE) ×2 IMPLANT
GLOVE ECLIPSE 7.5 STRL STRAW (GLOVE) ×8 IMPLANT
GLOVE EXAM NITRILE LRG STRL (GLOVE) IMPLANT
GLOVE EXAM NITRILE MD LF STRL (GLOVE) IMPLANT
GLOVE EXAM NITRILE XL STR (GLOVE) IMPLANT
GLOVE EXAM NITRILE XS STR PU (GLOVE) IMPLANT
GOWN STRL REUS W/ TWL LRG LVL3 (GOWN DISPOSABLE) ×2 IMPLANT
GOWN STRL REUS W/ TWL XL LVL3 (GOWN DISPOSABLE) IMPLANT
GOWN STRL REUS W/TWL 2XL LVL3 (GOWN DISPOSABLE) ×2 IMPLANT
GOWN STRL REUS W/TWL LRG LVL3 (GOWN DISPOSABLE) ×2
GOWN STRL REUS W/TWL XL LVL3 (GOWN DISPOSABLE)
HEMOSTAT POWDER KIT SURGIFOAM (HEMOSTASIS) ×2 IMPLANT
HEMOSTAT SURGICEL 2X14 (HEMOSTASIS) ×2 IMPLANT
KIT BASIN OR (CUSTOM PROCEDURE TRAY) ×2 IMPLANT
KIT DRAIN CSF ACCUDRAIN (MISCELLANEOUS) IMPLANT
KIT ROOM TURNOVER OR (KITS) ×2 IMPLANT
NEEDLE HYPO 25GX1X1/2 BEV (NEEDLE) IMPLANT
NEEDLE HYPO 25X1 1.5 SAFETY (NEEDLE) IMPLANT
NEEDLE SPNL 25GX3.5 QUINCKE BL (NEEDLE) IMPLANT
NS IRRIG 1000ML POUR BTL (IV SOLUTION) ×6 IMPLANT
PACK CRANIOTOMY (CUSTOM PROCEDURE TRAY) ×2 IMPLANT
PAD ARMBOARD 7.5X6 YLW CONV (MISCELLANEOUS) ×10 IMPLANT
PATTIES SURGICAL .25X.25 (GAUZE/BANDAGES/DRESSINGS) IMPLANT
PATTIES SURGICAL .5 X.5 (GAUZE/BANDAGES/DRESSINGS) IMPLANT
PATTIES SURGICAL .5 X3 (DISPOSABLE) IMPLANT
PATTIES SURGICAL 1/4 X 3 (GAUZE/BANDAGES/DRESSINGS) IMPLANT
PATTIES SURGICAL 1X1 (DISPOSABLE) IMPLANT
PIN MAYFIELD SKULL DISP (PIN) ×2 IMPLANT
RUBBERBAND STERILE (MISCELLANEOUS) ×4 IMPLANT
SPONGE NEURO XRAY DETECT 1X3 (DISPOSABLE) IMPLANT
SPONGE SURGIFOAM ABS GEL 100 (HEMOSTASIS) ×2 IMPLANT
SPONGE SURGIFOAM ABS GEL 100C (HEMOSTASIS) ×2 IMPLANT
STAPLER VISISTAT 35W (STAPLE) ×2 IMPLANT
STOCKINETTE 6  STRL (DRAPES) ×1
STOCKINETTE 6 STRL (DRAPES) ×1 IMPLANT
SUT ETHILON 3 0 FSL (SUTURE) IMPLANT
SUT NURALON 4 0 TR CR/8 (SUTURE) ×2 IMPLANT
SUT VIC AB 0 CT1 18XCR BRD8 (SUTURE) ×5 IMPLANT
SUT VIC AB 0 CT1 8-18 (SUTURE) ×5
SUT VIC AB 3-0 SH 8-18 (SUTURE) ×4 IMPLANT
SYR TB 1ML 25GX5/8 (SYRINGE) IMPLANT
TOWEL OR 17X24 6PK STRL BLUE (TOWEL DISPOSABLE) ×2 IMPLANT
TOWEL OR 17X26 10 PK STRL BLUE (TOWEL DISPOSABLE) ×4 IMPLANT
TRAY FOLEY W/METER SILVER 16FR (SET/KITS/TRAYS/PACK) IMPLANT
UNDERPAD 30X30 INCONTINENT (UNDERPADS AND DIAPERS) IMPLANT
WATER STERILE IRR 1000ML POUR (IV SOLUTION) ×2 IMPLANT

## 2015-12-17 NOTE — Progress Notes (Deleted)
STROKE TEAM PROGRESS NOTE   SUBJECTIVE  Family- Husband and sis-in law present. She has no complaints today, not saying much.   OBJECTIVE Temp:  [97.7 F (36.5 C)-98.3 F (36.8 C)] 98.1 F (36.7 C) (05/30 0800) Pulse Rate:  [78-109] 96 (05/30 1130) Cardiac Rhythm:  [-] Sinus tachycardia;Normal sinus rhythm (05/30 0726) Resp:  [12-27] 18 (05/30 1130) BP: (113-172)/(50-102) 121/65 mmHg (05/30 1130) SpO2:  [92 %-99 %] 97 % (05/30 1130)  CBC:   Recent Labs Lab 12/16/15 0355  WBC 7.1  NEUTROABS 2.6  HGB 15.0  HCT 44.6  MCV 81.7  PLT 093    Basic Metabolic Panel:   Recent Labs Lab 12/16/15 0355 12/17/15 0509  NA 138 137  K 3.3* 3.7  CL 105 106  CO2 21* 22  GLUCOSE 155* 158*  BUN 21* 15  CREATININE 1.02* 0.77  CALCIUM 9.4 9.2    Lipid Panel:     Component Value Date/Time   CHOL 263* 05/10/2015 0830   TRIG 83 05/10/2015 0830   HDL 76 05/10/2015 0830   CHOLHDL 3.5 05/10/2015 0830   VLDL 17 05/10/2015 0830   LDLCALC 170* 05/10/2015 0830   HgbA1c:  Lab Results  Component Value Date   HGBA1C 5.0 01/24/2015   Urine Drug Screen: No results found for: LABOPIA, COCAINSCRNUR, LABBENZ, AMPHETMU, THCU, LABBARB    IMAGING  Ct Angio Head W/cm &/or Wo Cm  12/16/2015  ADDENDUM REPORT: 12/16/2015 17:59 ADDENDUM: These results were called by telephone at the time of interpretation on 12/16/2015 at 5:59 pm to Dr. Silverio Decamp, who verbally acknowledged these results. Electronically Signed   By: Franchot Gallo M.D.   On: 12/16/2015 17:59  12/16/2015  CLINICAL DATA:  Intraventricular hemorrhage EXAM: CT ANGIOGRAPHY HEAD TECHNIQUE: Multidetector CT imaging of the head was performed using the standard protocol during bolus administration of intravenous contrast. Multiplanar CT image reconstructions and MIPs were obtained to evaluate the vascular anatomy. CONTRAST:  50 mL Isovue 370 IV COMPARISON:  CT chest 12/16/2015 FINDINGS: CT HEAD Brain: Acute hemorrhage is present in the fourth  ventricle, third ventral, and lateral ventricles similar in amount. There is also some blood around the foramen magnum in the subarachnoid space. Progressive hydrocephalus with interval enlargement of the ventricles and temporal horns. Calvarium and skull base: Negative Paranasal sinuses: Negative Orbits: Negative CTA HEAD Anterior circulation: Cavernous carotid widely patent bilaterally with mild atherosclerotic calcification. Anterior and middle cerebral arteries widely patent. No aneurysm in the anterior circulation. Posterior circulation: Both vertebral arteries patent to the basilar. PICA patent bilaterally. 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 This is most likely the cause of the intraventricular hemorrhage. This aneurysm is near the fourth ventricle. Basilar is widely patent. Superior cerebellar and posterior cerebral arteries widely patent bilaterally. No additional aneurysm in the posterior circulation. Venous sinuses: Patent Anatomic variants: None Delayed phase:Normal enhancement on delayed imaging IMPRESSION: Intraventricular hemorrhage is centered in the fourth ventricle and is unchanged from earlier today. 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 Electronically Signed: By: Franchot Gallo M.D. On: 12/16/2015 17:17   Ct Head Wo Contrast  12/16/2015  CLINICAL DATA:  Severe headache, nausea and vomiting, onset last night. EXAM: CT HEAD WITHOUT CONTRAST TECHNIQUE: Contiguous axial images were obtained from the base of the skull through the vertex without intravenous contrast. COMPARISON:  Head CT 09/05/2007 FINDINGS: Acute hemorrhage in the  posterior basilar cisterns, fourth, and third ventricle. There is mild symmetric extension into the lateral ventricles. Ventricular size minimally increased from 2009, with prominence of the temporal horns. Hemorrhage causes  mass effect on the brainstem. No evidence of acute ischemia, subdural collection or intracranial mass. The calvarium is intact. Included paranasal sinuses and mastoid air cells are well aerated. IMPRESSION: Acute hemorrhage in the basilar cisterns, third and fourth ventricle extending into the lateral ventricles. There is mass effect on the brainstem. 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 Critical Value/emergent results were called by telephone at the time of interpretation on 12/16/2015 at 4:30 am to Dr. Joseph Berkshire , who verbally acknowledged these results. Electronically Signed   By: Jeb Levering M.D.   On: 12/16/2015 04:31   Chest Port 1 View  12/16/2015  CLINICAL DATA:  CVA. EXAM: PORTABLE CHEST 1 VIEW COMPARISON:  05/10/15 chest radiograph. FINDINGS: Surgical clips overlie the lower cervical spine. Low lung volumes. Stable cardiomediastinal silhouette with normal heart size. No pneumothorax. No pleural effusion. Mild bibasilar atelectasis. No pulmonary edema. IMPRESSION: Low lung volumes with mild bibasilar atelectasis. Electronically Signed   By: Ilona Sorrel M.D.   On: 12/16/2015 07:58    PHYSICAL EXAM [name] currently not in distress. Flat affect. Afebrile. Head is nontraumatic. Cardiac exam no murmur or gallop. Lungs are clear to auscultation- ant auscultation. Distal pulses are well felt. Neurological Exam ;  Awake  Alert oriented x 3. Normal speech and language.eye movements full without nystagmus. fundi were not visualized.  Hearing is normal. Face symmetric. Tongue midline.  Normal strength, tone, reflexes and coordination. Normal sensation. Finger to nose- intact, Gait deferred.  ASSESSMENT/PLAN Kelly Wilson is a 60 y.o. female with history of HTN, HLD and TIA presenting with new onset headache with nausea and mild vertigo. 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 mass effect on the brainstem. 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  Home meds: triamterene-HCTZ 37.5/25  Hyperglycemia  Gluc 155  HgbA1c pending   Hyperlipidemia  Home meds:  No statin  LDL 170 mg%  Other Stroke Risk Factors  Obesity, Body mass index is 38.76 kg/(m^2).   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 Replace with 2 runs. Recheck in am  Ejiro  Denice Bors- Resident 12/17/2015 4:49 PM

## 2015-12-17 NOTE — Progress Notes (Signed)
STROKE TEAM PROGRESS NOTE   HISTORY OF PRESENT ILLNESS (per record) Kelly Wilson is an 60 y.o. female history of hypertension and hyperlipidemia, as well as TIA, presenting with new onset headache with nausea and mild vertigo starting at 6 PM 12/15/2015 ([facility]). She has a remote history of migraine headaches. This headache is different from any that she has previously experienced. CT scan of her head showed acute hemorrhage involving basilar cisterns, third and fourth ventricles and extending into lateral ventricles. Mass effect on brainstem was noted. Early hydrocephalus cannot be ruled out. Patient has not experienced a change in speech nor facial droop. She also has not had experienced weakness no numbness involving extremities. Vision has remained unchanged. NIH stroke score at the time of this evaluation was 0. Patient was not administered IV t-PA secondary to IVH. She was admitted for further evaluation and treatment.   SUBJECTIVE (INTERVAL HISTORY) Patient complains of headache. She denies nausea dizziness or vertigo.    Her blood pressure appears adequately controlled today on cardene drip.she has no new complaints.CT angiogram of the brain showed a 5 mm right PICA aneurysm which is the likely culprit for her intraventricular hemorrhage. CT scan showed increasing hydrocephalus and dilatation of the right lateral ventricle. Patient has had neurosurgery consultation and plan is to do emergent occipital craniotomy with surgical clipping of a PICA aneurysm later today   OBJECTIVE Temp:  [97.7 F (36.5 C)-98.3 F (36.8 C)] 98.1 F (36.7 C) (05/30 0800) Pulse Rate:  [78-109] 96 (05/30 1130) Cardiac Rhythm:  [-] Sinus tachycardia;Normal sinus rhythm (05/30 0726) Resp:  [12-27] 18 (05/30 1130) BP: (113-172)/(50-102) 121/65 mmHg (05/30 1130) SpO2:  [92 %-99 %] 97 % (05/30 1130)  CBC:   Recent Labs Lab 12/16/15 0355  WBC 7.1  NEUTROABS 2.6  HGB 15.0  HCT 44.6  MCV 81.7  PLT 268     Basic Metabolic Panel:   Recent Labs Lab 12/16/15 0355 12/17/15 0509  NA 138 137  K 3.3* 3.7  CL 105 106  CO2 21* 22  GLUCOSE 155* 158*  BUN 21* 15  CREATININE 1.02* 0.77  CALCIUM 9.4 9.2    Lipid Panel:     Component Value Date/Time   CHOL 263* 05/10/2015 0830   TRIG 83 05/10/2015 0830   HDL 76 05/10/2015 0830   CHOLHDL 3.5 05/10/2015 0830   VLDL 17 05/10/2015 0830   LDLCALC 170* 05/10/2015 0830   HgbA1c:  Lab Results  Component Value Date   HGBA1C 5.0 01/24/2015   Urine Drug Screen: No results found for: LABOPIA, COCAINSCRNUR, LABBENZ, AMPHETMU, THCU, LABBARB    IMAGING  Ct Angio Head W/cm &/or Wo Cm  12/16/2015  ADDENDUM REPORT: 12/16/2015 17:59 ADDENDUM: These results were called by telephone at the time of interpretation on 12/16/2015 at 5:59 pm to Dr. Silverio Decamp, who verbally acknowledged these results. Electronically Signed   By: Franchot Gallo M.D.   On: 12/16/2015 17:59  12/16/2015  CLINICAL DATA:  Intraventricular hemorrhage EXAM: CT ANGIOGRAPHY HEAD TECHNIQUE: Multidetector CT imaging of the head was performed using the standard protocol during bolus administration of intravenous contrast. Multiplanar CT image reconstructions and MIPs were obtained to evaluate the vascular anatomy. CONTRAST:  50 mL Isovue 370 IV COMPARISON:  CT chest 12/16/2015 FINDINGS: CT HEAD Brain: Acute hemorrhage is present in the fourth ventricle, third ventral, and lateral ventricles similar in amount. There is also some blood around the foramen magnum in the subarachnoid space. Progressive hydrocephalus with interval enlargement of the ventricles  and temporal horns. Calvarium and skull base: Negative Paranasal sinuses: Negative Orbits: Negative CTA HEAD Anterior circulation: Cavernous carotid widely patent bilaterally with mild atherosclerotic calcification. Anterior and middle cerebral arteries widely patent. No aneurysm in the anterior circulation. Posterior circulation: Both vertebral  arteries patent to the basilar. PICA patent bilaterally. 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 This is most likely the cause of the intraventricular hemorrhage. This aneurysm is near the fourth ventricle. Basilar is widely patent. Superior cerebellar and posterior cerebral arteries widely patent bilaterally. No additional aneurysm in the posterior circulation. Venous sinuses: Patent Anatomic variants: None Delayed phase:Normal enhancement on delayed imaging IMPRESSION: Intraventricular hemorrhage is centered in the fourth ventricle and is unchanged from earlier today. 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 Electronically Signed: By: Franchot Gallo M.D. On: 12/16/2015 17:17   Ct Head Wo Contrast  12/16/2015  CLINICAL DATA:  Severe headache, nausea and vomiting, onset last night. EXAM: CT HEAD WITHOUT CONTRAST TECHNIQUE: Contiguous axial images were obtained from the base of the skull through the vertex without intravenous contrast. COMPARISON:  Head CT 09/05/2007 FINDINGS: Acute hemorrhage in the posterior basilar cisterns, fourth, and third ventricle. There is mild symmetric extension into the lateral ventricles. Ventricular size minimally increased from 2009, with prominence of the temporal horns. Hemorrhage causes mass effect on the brainstem. No evidence of acute ischemia, subdural collection or intracranial mass. The calvarium is intact. Included paranasal sinuses and mastoid air cells are well aerated. IMPRESSION: Acute hemorrhage in the basilar cisterns, third and fourth ventricle extending into the lateral ventricles. There is mass effect on the brainstem. 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 Critical Value/emergent results were called by telephone at the time of interpretation on 12/16/2015  at 4:30 am to Dr. Joseph Berkshire , who verbally acknowledged these results. Electronically Signed   By: Jeb Levering M.D.   On: 12/16/2015 04:31   Chest Port 1 View  12/16/2015  CLINICAL DATA:  CVA. EXAM: PORTABLE CHEST 1 VIEW COMPARISON:  05/10/15 chest radiograph. FINDINGS: Surgical clips overlie the lower cervical spine. Low lung volumes. Stable cardiomediastinal silhouette with normal heart size. No pneumothorax. No pleural effusion. Mild bibasilar atelectasis. No pulmonary edema. IMPRESSION: Low lung volumes with mild bibasilar atelectasis. Electronically Signed   By: Ilona Sorrel M.D.   On: 12/16/2015 07:58   CTA Brain 12/16/15 : Intraventricular hemorrhage is centered in the fourth ventricle and is unchanged from earlier today.Developing hydrocephalus with progressive ventricular dilatation since earlier today. 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   PHYSICAL EXAM [name] currently not in distress. . Afebrile. Head is nontraumatic. Neck is supple without bruit.    Cardiac exam no murmur or gallop. Lungs are clear to auscultation. Distal pulses are well felt. Neurological Exam ;  Awake  Alert oriented x 3. Normal speech and language.eye movements full without nystagmus.fundi were not visualized. Vision acuity and fields appear normal. Hearing is normal. Palatal movements are normal. Face symmetric. Tongue midline. Normal strength, tone, reflexes and coordination. Normal sensation. Gait deferred. ASSESSMENT/PLAN Kelly Wilson is a 60 y.o. female with history of HTN, HLD and TIA presenting with new onset headache with nausea and mild vertigo. CT showed acute IVH.  Stroke:   Intraventricular  intracerebral hemorrhage with cerebral edema (mass effect on brainstem) and early hydrocephalus secondary to likely accelerated  hypertension   Resultant  No Deficits  CT head  Hemorrhage in basilar  cisterns, third and fourth, and lateral ventriclar hemorrhage. mass effect on the brainstem. 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 No MRI d/t hx stapedectomy  Carotid Doppler  pending   2D Echo  not ordered  LDL 170 mg%  Hold statin due to acute ICH  HgbA1c pending  SCDs for VTE prophylaxis Diet NPO time specified  aspirin 325 mg daily prior to admission  Ongoing aggressive stroke risk factor management  Therapy recommendations:  pending   Disposition:  pending   Hypertensive Emergency  BP 171/78 on arrival to ED in setting of neurologic symptoms  SBP goal < 160  Received 5 mg labetolol at 505a and remains elevated  Continued to monitor and treat  Home meds: triamterene-HCTZ 37.5/25  Hyperglycemia  Gluc 155  HgbA1c pending   Hyperlipidemia  Home meds:  No statin  LDL 170 mg%  Other Stroke Risk Factors  Obesity, Body mass index is 38.76 kg/(m^2).   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 Replace with 2 runs. Recheck in am  Hospital day # 1  I have personally examined this patient, reviewed notes, independently viewed imaging studies, participated in medical decision making and plan of care. I have made any additions or clarifications directly to the above note. She presented with acute onset of headache and nausea and dizziness secondary  to intraventricular hemorrhage  With slightly elevated blood pressure. Etiology of the hemorrhage likely leaking PICA aneurysm directly into the fourth ventricle.. She remains at significant risk for neurological worsening,, aneurysm re-rupture hydrocephalus and needs emergent aneurysm clipping.  I had a long discussion with the patient and her husband at the bedside and answered questions. Continue Imodium been handled check transcranial Doppler studies  are to visit days. Tight blood pressure control. Discussed with patient, husband and Dr. Kathyrn Sheriff   This patient is critically ill and at significant risk of neurological worsening, death and care requires constant monitoring of vital signs, hemodynamics,respiratory and cardiac monitoring, extensive review of multiple databases, frequent neurological assessment, discussion with family, other specialists and medical decision making of high complexity.I have made any additions or clarifications directly to the above note.This critical care time does not reflect procedure time, or teaching time or supervisory time of PA/NP/Med Resident etc but could involve care discussion time.  I spent 40 minutes of neurocritical care time  in the care of  this patient.    Antony Contras, MD Medical Director [facility] Pager: [phone] 12/17/2015 5:05 PM     To contact Stroke Continuity provider, please refer to [email or web address] After hours, contact General Neurology

## 2015-12-17 NOTE — Progress Notes (Signed)
Dr Ditty notified of head CT results, BMP and CBC results. Neuro status unchanged still lethargic, snoring, with incomprehensible speech at times. Ordered to give 0.2mg  Narcan.  Narcan given with slight improvement patient keeping eyes open a little longer but still not verbally communicating. Ordered to start Keppra 500mg  BID and continue to monitor neuro/respiratory status.

## 2015-12-17 NOTE — Transfer of Care (Signed)
Immediate Anesthesia Transfer of Care Note  Patient: Kelly Wilson  Procedure(s) Performed: Procedure(s) with comments: Suboccipital CRANIectomy INTRACRANIAL ANEURYSM FOR VERTEBRAL/BASILAR (Right) - suboccipital  Patient Location: PACU  Anesthesia Type:General  Level of Consciousness: awake, lethargic and Patient remains intubated per anesthesia plan  Airway & Oxygen Therapy: Patient Spontanous Breathing and Patient connected to T-piece oxygen  Post-op Assessment: Report given to RN, Post -op Vital signs reviewed and stable and Patient moving all extremities X 4  Post vital signs: Reviewed and stable  Last Vitals:  Filed Vitals:   12/17/15 1100 12/17/15 1130  BP: 125/64 121/65  Pulse: 89 96  Temp:    Resp: 17 18    Last Pain:  Filed Vitals:   12/17/15 1659  PainSc: 2       Patients Stated Pain Goal: 0 ([id number])  Complications: No apparent anesthesia complications

## 2015-12-17 NOTE — Progress Notes (Signed)
Patient very lethargic post-op. Speech is slurred with expressive aphasia. Will open eyes to stimulation and follow commands but immediately falls back to a snoring sleep. NIH now a 54. Dr Cyndy Freeze notified and received orders for stat head CT and to give 1g keppra.

## 2015-12-17 NOTE — Progress Notes (Addendum)
Pt seen and examined, history reviewed. Pt had onset of HA on Sunday afternoon in church. Able to go home, eat, went to bed. Had recurrent severe HA early Mon am and came to ED where CT demonstrated primarily IVH of 3rd/4th ventricle. CTA demonstrated a distal right PICA aneurysm. She does not report associated symptoms of facial N/T, droop, dysphagia, dysarthria, or limb N/T/W. No visual changes.  She has a medical history of HTN, denies smoking, no known FHx of aneurysms.  EXAM: Temp:  [97.4 F (36.3 C)-98.3 F (36.8 C)] 98.3 F (36.8 C) (05/30 0400) Pulse Rate:  [77-109] 102 (05/30 0700) Resp:  [12-27] 15 (05/30 0700) BP: (113-192)/(52-102) 159/65 mmHg (05/30 0700) SpO2:  [92 %-100 %] 95 % (05/30 0700) Intake/Output      05 /29 0701 - 05/30 0700 05/30 0701 - 05/31 0700   P.O. 200    I.V. (mL/kg) 3079.6 (28.3)    IV Piggyback 200    Total Intake(mL/kg) 3479.6 (32)    Urine (mL/kg/hr) 2550 (1)    Emesis/NG output 1 (0)    Stool 0 (0)    Total Output 2551     Net +928.6          Urine Occurrence 1 x    Stool Occurrence 1 x     Slightly drowsy but easily arousable.  Oriented x4 CN grossly intact Good strength throughout  LABS: Lab Results  Component Value Date   CREATININE 0.77 12/17/2015   BUN 15 12/17/2015   NA 137 12/17/2015   K 3.7 12/17/2015   CL 106 12/17/2015   CO2 22 12/17/2015   Lab Results  Component Value Date   WBC 7.1 12/16/2015   HGB 15.0 12/16/2015   HCT 44.6 12/16/2015   MCV 81.7 12/16/2015   PLT 268 12/16/2015    IMAGING: CT demonstrates primarily IVH with 4th and 3rd ventricular blood. Some dilatation of the lateral ventricles including temporal horns. Minimal SAH.  CTA reviewed which demonstrates ~22mm aneurysm of the distal PICA (distal to telovelotonsillar segment). No other aneurysms seen.  IMPRESSION: - 60 y.o. 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 Given the distal location, I do not believe endovascular  treatment would be feasible and this aneurysm will need clip ligation.  PLAN: - Suboccipital craniectomy for clipping of aneurysm - Would treat as standard SAH and begin Nimotop, TCD monitoring for spasm  I have reviewed the CT, CTA findings with the patient. The need for aneurysm treatment was discussed including the risk of rerupture without treatment. Risks of surgery were reviewed, including stroke/bleeding leading to weakness, paralysis, coma, death, SZ, hydrocephalus and need for addition procedures including ventriculostomy. The patient is certainly apprehensive but appears to understand our discussion and is willing to proceed. All questions were answered.

## 2015-12-17 NOTE — Progress Notes (Signed)
12/17/15 1033  Clinical Encounter Type  Visited With Patient and family together;Health care provider  Visit Type Initial;Pre-op  Stress Factors  Family Stress Factors Health changes   Chaplain checked in with a patient who is going to surgery today. Chaplain met with patient's husband. Chaplain introduced spiritual care services. Spiritual care services available as needed.   Jeri Lager, Chaplain 12/17/2015 10:34 AM

## 2015-12-17 NOTE — Anesthesia Procedure Notes (Signed)
Procedure Name: Intubation Date/Time: 12/17/2015 12:34 PM Performed by: Ollen Bowl Pre-anesthesia Checklist: Patient identified, Emergency Drugs available, Patient being monitored and Suction available Patient Re-evaluated:Patient Re-evaluated prior to inductionOxygen Delivery Method: Circle system utilized Preoxygenation: Pre-oxygenation with 100% oxygen Intubation Type: IV induction, Cricoid Pressure applied and Rapid sequence Laryngoscope Size: Mac and 3 Grade View: Grade II Tube type: Oral Tube size: 7.5 mm Number of attempts: 1 Airway Equipment and Method: Stylet Placement Confirmation: ETT inserted through vocal cords under direct vision,  positive ETCO2 and breath sounds checked- equal and bilateral Secured at: 22 cm Tube secured with: Tape Dental Injury: Teeth and Oropharynx as per pre-operative assessment

## 2015-12-17 NOTE — Op Note (Signed)
PREOP DIAGNOSIS:  1. Subarachnoid hemorrhage 2. Right PICA Aneurysm  POSTOP DIAGNOSIS: Same  PROCEDURE: 1. Suboccipital craniectomy for clipping of right PICA (posterior circulation) aneursym, complex due to need for temporary parent vessel occlusion 2. Use of operating microscope for microdissection 3. Use of intraoperative ICG videoangiography   SURGEON: Dr. Consuella Lose, MD  ASSISTANT: Dr. Ashok Pall, MD  ANESTHESIA: General Endotracheal  EBL: 500cc  SPECIMENS: None  DRAINS: None  COMPLICATIONS: None immediate  CONDITION: Hemodynamically stable to PACU  HISTORY: Kelly Wilson is a 60 y.o. female presenting to the emergency department yesterday with severe headache. Initial CT scan demonstrated primarily intraventricular hemorrhage. Subsequent CT angiogram demonstrated a right PICA aneurysm distally, as the likely source of hemorrhage. With these findings, surgical lipping of the aneurysm was indicated. Given the distal location of the aneurysm, endovascular treatment was unlikely to be successful. Risks and benefits were discussed in detail with the patient and her family. After all questions were answered, informed consent was obtained and witnessed.  PROCEDURE IN DETAIL: After informed consent was obtained and witnessed, the patient was brought to the operating room. After induction of general anesthesia, the patient was positioned on the operative table in the [place] headholder in the prone position. All pressure points were meticulously padded. Skin incision was then marked out and prepped and draped in the usual sterile fashion.  After time-out was conducted, skin incision was made sharply and Bovie electrocautery was used to dissect the subcutaneous tissue. The nuchal fascia was then incised in a Y-shaped fashion, and the suboccipital musculature was divided in the avascular midline plane. The occipital bone was then dissected in the subperiosteal plane, and  self-retaining retractors were placed after exposure of C1, and the entire foramen magnum.  Suboccipital craniectomy was then completed utilizing a combination of high-speed drill and rongeurs. C1 laminectomy was also completed with high-speed drill and Rogers. Hemostasis was secured on the epidural surface. The dura was then opened in a Y-shaped fashion, and dural edges were retracted with Nurolon.  At this point, the microscope was draped sterilely and brought into the field, and the remainder of the case was done under the microscope using microdissection. The cerebellar tonsils were identified and dissected. The obex was then identified. The tonsils were dissected a little more laterally, and the right PICA was identified. This was then traced to identify a irregular shaped aneurysm which was projecting superiorly. This aneurysm was circumferentially dissected with microdissectors, as well as bipolar electrocautery for resection of a portion of the cerebellum on the dome. During dissection, a small hole was created in the aneurysm, with easily controllable arterial bleeding. We therefore placed a temporary clip on the proximal PICA. Dissection around the aneurysm was then completed.  A mini permanent titanium clip with a slight curve was then selected, and placed across the neck of the aneurysm. The temporary clip was then removed, with no further bleeding identified. The clip was adjusted slightly towards the dome to ensure patency of the PICA.  At this point, the anesthesia service administered a 12.5 mg bolus dose of ICG, and the heel angiography was performed. This demonstrated patency of the PICA, with occlusion of the aneurysm.  The wound was then irrigated with copious amounts of normal saline irrigation. Good hemostasis was achieved on the brain surface with morcellized Gelfoam with thrombin. A piece of dura matrix was then used as an inlay graft. Dural leaflets were reapproximated loosely with  4 Nurolon stitch. DuraSeal was then applied,  and an onlay graft was placed.  The wound was then closed in multiple layers using a combination of 0 and 3-0 Vicryl stitches. Skin was closed with Dermabond. The patient was then removed from the [place] head holder and transferred to the stretcher. She was then abated and taken to the postanesthesia care unit in stable hemodynamic condition. At the end of the case all sponge, needle, instrument, and cottonoid counts were correct.

## 2015-12-17 NOTE — Anesthesia Postprocedure Evaluation (Signed)
Anesthesia Post Note  Patient: Kelly Wilson  Procedure(s) Performed: Procedure(s) (LRB): Suboccipital CRANIectomy INTRACRANIAL ANEURYSM FOR VERTEBRAL/BASILAR (Right)  Patient location during evaluation: PACU Anesthesia Type: General Level of consciousness: awake and patient cooperative Pain management: pain level controlled Vital Signs Assessment: post-procedure vital signs reviewed and stable Respiratory status: spontaneous breathing, nonlabored ventilation, respiratory function stable and patient connected to nasal cannula oxygen Cardiovascular status: blood pressure returned to baseline and stable Postop Assessment: no headache, patient able to bend at knees and no signs of nausea or vomiting Anesthetic complications: no    Last Vitals:  Filed Vitals:   12/17/15 1715 12/17/15 1732  BP: 141/80 153/81  Pulse: 76 82  Temp:  36.1 C  Resp: 21 13    Last Pain:  Filed Vitals:   12/17/15 1734  PainSc: 2                  , A.

## 2015-12-17 NOTE — Anesthesia Preprocedure Evaluation (Addendum)
Anesthesia Evaluation  Patient identified by MRN, date of birth, ID band Patient awake    Reviewed: Allergy & Precautions, NPO status , Patient's Chart, lab work & pertinent test results, reviewed documented beta blocker date and time   Airway Mallampati: III  TM Distance: >3 FB Neck ROM: Full    Dental  (+) Teeth Intact, Caps   Pulmonary neg pulmonary ROS,    Pulmonary exam normal breath sounds clear to auscultation       Cardiovascular hypertension, Pt. on medications Normal cardiovascular exam Rhythm:Regular Rate:Normal     Neuro/Psych  Headaches, Ruptured PICA 3x62mm anuerysm with blood extending into the 4th and 3rd ventricles negative psych ROS   GI/Hepatic Neg liver ROS, GERD  Medicated and Controlled,  Endo/Other  Hyperlipidemia Obesity  Renal/GU Renal InsufficiencyRenal disease  negative genitourinary   Musculoskeletal  (+) Arthritis , Osteoarthritis,  Spinal stenosis Chronic Low Back Pain   Abdominal (+) + obese,   Peds  Hematology negative hematology ROS (+)   Anesthesia Other Findings Disoriented to place and time  Reproductive/Obstetrics                           Anesthesia Physical Anesthesia Plan  ASA: III and emergent  Anesthesia Plan: General   Post-op Pain Management:    Induction: Intravenous  Airway Management Planned: Oral ETT  Additional Equipment: Arterial line  Intra-op Plan:   Post-operative Plan: Extubation in OR and Possible Post-op intubation/ventilation  Informed Consent: I have reviewed the patients History and Physical, chart, labs and discussed the procedure including the risks, benefits and alternatives for the proposed anesthesia with the patient or authorized representative who has indicated his/her understanding and acceptance.   Dental advisory given  Plan Discussed with: CRNA, Anesthesiologist and Surgeon  Anesthesia Plan Comments:         Anesthesia Quick Evaluation

## 2015-12-18 ENCOUNTER — Inpatient Hospital Stay (HOSPITAL_COMMUNITY): Payer: BLUE CROSS/BLUE SHIELD

## 2015-12-18 DIAGNOSIS — I609 Nontraumatic subarachnoid hemorrhage, unspecified: Secondary | ICD-10-CM

## 2015-12-18 DIAGNOSIS — R0989 Other specified symptoms and signs involving the circulatory and respiratory systems: Secondary | ICD-10-CM

## 2015-12-18 LAB — HEMOGLOBIN A1C
HEMOGLOBIN A1C: 5.7 % — AB (ref 4.8–5.6)
Mean Plasma Glucose: 117 mg/dL

## 2015-12-18 MED ORDER — CETYLPYRIDINIUM CHLORIDE 0.05 % MT LIQD
7.0000 mL | Freq: Two times a day (BID) | OROMUCOSAL | Status: DC
  Administered 2015-12-19 – 2015-12-21 (×6): 7 mL via OROMUCOSAL

## 2015-12-18 MED ORDER — CHLORHEXIDINE GLUCONATE 0.12 % MT SOLN
15.0000 mL | Freq: Two times a day (BID) | OROMUCOSAL | Status: DC
  Administered 2015-12-18 – 2015-12-21 (×5): 15 mL via OROMUCOSAL
  Filled 2015-12-18 (×4): qty 15

## 2015-12-18 NOTE — Progress Notes (Signed)
Preliminary results by tech - Carotid Duplex Completed. Mild intimal thickening with mild heterogenenous  plaque in bilateral internal carotid arteries without significant stenosis noted 1-39%. Vertebral arteries demonstrate antegrade flow. Oda Cogan, BS, RDMS, RVT

## 2015-12-18 NOTE — Progress Notes (Signed)
   12/18/15 1038  Clinical Encounter Type  Visited With Patient and family together;Health care provider  Visit Type Follow-up;Post-op;Critical Care   Chaplain followed up with patient and patient's husband. Husband indicated that the surgery went well yesterday, and expressed no needs at this time. Husband indicated they have plenty of support around them. Spiritual care services available as needed.   Jeri Lager, Chaplain 12/18/2015 10:40 AM

## 2015-12-18 NOTE — Progress Notes (Signed)
STROKE TEAM PROGRESS NOTE   SUBJECTIVE (INTERVAL HISTORY) Had successful clipping of her aneurysm yesterday- 12/17/15. Post -op CT scan shows Clip and some air in the intracranial cavity. She is drowsy today, obeys commands. Family at bedside. She has some disorientation. Started on keppra overnight. There were no documented seizure activity. Blood pressure is adequately controlled.  OBJECTIVE Temp:  [97 F (36.1 C)-99 F (37.2 C)] 98.8 F (37.1 C) (05/31 1200) Pulse Rate:  [63-101] 85 (05/31 1100) Cardiac Rhythm:  [-] Normal sinus rhythm (05/30 2000) Resp:  [13-34] 16 (05/31 1100) BP: (99-153)/(37-118) 141/58 mmHg (05/31 1100) SpO2:  [87 %-100 %] 98 % (05/31 1100) Arterial Line BP: (96-170)/(44-71) 134/56 mmHg (05/31 1100) FiO2 (%):  [40 %] 40 % (05/30 1646)  CBC:   Recent Labs Lab 12/16/15 0355 12/17/15 2145  WBC 7.1 16.4*  NEUTROABS 2.6  --   HGB 15.0 13.1  HCT 44.6 40.2  MCV 81.7 83.1  PLT 268 99991111    Basic Metabolic Panel:   Recent Labs Lab 12/17/15 0509 12/17/15 2145  NA 137 139  K 3.7 3.7  CL 106 112*  CO2 22 21*  GLUCOSE 158* 152*  BUN 15 17  CREATININE 0.77 0.81  CALCIUM 9.2 8.3*    Lipid Panel:     Component Value Date/Time   CHOL 263* 05/10/2015 0830   TRIG 83 05/10/2015 0830   HDL 76 05/10/2015 0830   CHOLHDL 3.5 05/10/2015 0830   VLDL 17 05/10/2015 0830   LDLCALC 170* 05/10/2015 0830   HgbA1c:  Lab Results  Component Value Date   HGBA1C 5.7* 12/17/2015   Urine Drug Screen: No results found for: LABOPIA, COCAINSCRNUR, LABBENZ, AMPHETMU, THCU, LABBARB   IMAGING  Ct Angio Head W/cm &/or Wo Cm  12/16/2015  ADDENDUM REPORT: 12/16/2015 17:59 ADDENDUM: These results were called by telephone at the time of interpretation on 12/16/2015 at 5:59 pm to Dr. Silverio Decamp, who verbally acknowledged these results. Electronically Signed   By: Franchot Gallo M.D.   On: 12/16/2015 17:59  12/16/2015  CLINICAL DATA:  Intraventricular hemorrhage EXAM: CT  ANGIOGRAPHY HEAD TECHNIQUE: Multidetector CT imaging of the head was performed using the standard protocol during bolus administration of intravenous contrast. Multiplanar CT image reconstructions and MIPs were obtained to evaluate the vascular anatomy. CONTRAST:  50 mL Isovue 370 IV COMPARISON:  CT chest 12/16/2015 FINDINGS: CT HEAD Brain: Acute hemorrhage is present in the fourth ventricle, third ventral, and lateral ventricles similar in amount. There is also some blood around the foramen magnum in the subarachnoid space. Progressive hydrocephalus with interval enlargement of the ventricles and temporal horns. Calvarium and skull base: Negative Paranasal sinuses: Negative Orbits: Negative CTA HEAD Anterior circulation: Cavernous carotid widely patent bilaterally with mild atherosclerotic calcification. Anterior and middle cerebral arteries widely patent. No aneurysm in the anterior circulation. Posterior circulation: Both vertebral arteries patent to the basilar. PICA patent bilaterally. 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 This is most likely the cause of the intraventricular hemorrhage. This aneurysm is near the fourth ventricle. Basilar is widely patent. Superior cerebellar and posterior cerebral arteries widely patent bilaterally. No additional aneurysm in the posterior circulation. Venous sinuses: Patent Anatomic variants: None Delayed phase:Normal enhancement on delayed imaging IMPRESSION: Intraventricular hemorrhage is centered in the fourth ventricle and is unchanged from earlier today. 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 Electronically Signed: By:  Franchot Gallo M.D. On: 12/16/2015 17:17   Ct Head Wo Contrast  12/17/2015  CLINICAL DATA:  Initial evaluation for acute postoperative lethargy lethargy, expressive aphasia. EXAM: CT HEAD WITHOUT CONTRAST  TECHNIQUE: Contiguous axial images were obtained from the base of the skull through the vertex without intravenous contrast. COMPARISON:  Prior study from 12/16/2015. FINDINGS: Postoperative changes from interval suboccipital craniectomy for surgical clipping of a right high CT aneurysm are seen. Surgical clip in place Ing region of previously identified aneurysm associated pneumocephalus with gas extending into the adjacent fourth ventricle. Scattered pneumocephalus present throughout the basilar cisterns and overlying the cerebral convexities. Intraventricular hemorrhage is overall stable to slightly decreased from previous exam with blood seen layering within the fourth ventricle, third ventricle, and lateral ventricles. Ventricular dilatation is slightly improved, although there is still felt to be hydrocephalus with persistent small amount transependymal flow of CSF. Crowding of the basilar cisterns and at the foramen magnum, stable to slightly improved. No midline shift. No definite evolving large vessel territory infarct identified. Postoperative changes within the suboccipital scalp. No acute abnormality about the orbits. Small amount of layering opacity layering within the sphenoid sinuses. Paranasal sinuses are otherwise clear. No mastoid effusion. IMPRESSION: 1. Postoperative sequela of interval suboccipital craniectomy for surgical clipping of a right PICA territory aneurysm. 2. Persistent but slightly improved hydrocephalus as compared to preoperative exam. Overall intraventricular hemorrhage is stable to slightly improved. Crowding of the basilar cisterns also stable to slightly improved. 3. No definite evolving large vessel territory infarct or other complication. Electronically Signed   By: Jeannine Boga M.D.   On: 12/17/2015 22:32   Dg Abd Portable 1v  12/18/2015  CLINICAL DATA:  NG tube placement. EXAM: PORTABLE ABDOMEN - 1 VIEW COMPARISON:  None. FINDINGS: There is evidence of patient's  NG tube with tip over the stomach in the left upper quadrant as the non radiopaque side port is in the region of the gastroesophageal junction. Bowel gas pattern is nonobstructive. There are mild degenerative changes of the spine. IMPRESSION: No acute findings. Nasogastric tube with tip over the stomach in the left upper quadrant and side-port in the region of the gastroesophageal junction. Electronically Signed   By: Marin Olp M.D.   On: 12/18/2015 07:25   CTA Brain 12/16/15 : Intraventricular hemorrhage is centered in the fourth ventricle and is unchanged from earlier today.Developing hydrocephalus with progressive ventricular dilatation since earlier today. 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   PHYSICAL EXAM [name] drowsy, but responding to commands, some disorientation. Not in distress. . Afebrile. Head is nontraumatic. Cardiac exam no murmur or gallop. Lungs are clear to auscultation- anteriorly.  Neurological Exam ;  Drowsy. But can be aroused easily.slightly hypophonic speech and follows commands well.. Not spontaneously opening her eyes, but following commands, Answered simple questions. Hearing appears normal. Face symmetric. Moving all extremities. Normal strength, tone, reflexes and coordination.but appears to move left side slightly less than the right. Normal sensation. Gait deferred.  ASSESSMENT/PLAN Ms. ANAYLAH LUPERCIO is a 60 y.o. female with history of HTN, HLD and TIA presenting with new onset headache with nausea and mild vertigo. CT showed acute IVH.  Stroke:   Intraventricular intracerebral hemorrhage with cerebral edema (mass effect on brainstem) and early hydrocephalus secondary to likely 2/2 Intracranial Aneurysm, S/p Surgical Clipping 12/17/15.  Resultant  No Deficits  CT head  Hemorrhage in basilar cisterns, third and  fourth, and lateral ventriclar hemorrhage. mass effect on  the brainstem. 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 No MRI d/t hx stapedectomy  Carotid Doppler - Mild intimal thickening with mild heterogenenous plaque in bilateral internal carotid arteries without significant stenosis noted 1-39%  2D Echo  not ordered  LDL 170 mg  Hold statin due to acute ICH  HgbA1c 5.7  SCDs for VTE prophylaxis Diet NPO time specified, swallow eval. By Speech therapy, can resume home meds subsequently.  aspirin 325 mg daily prior to admission  Ongoing aggressive stroke risk factor management  Therapy recommendations:  Pending, order PT eval when appropriate.  Disposition:  pending   PICA Aneurysm  Post Surgical Clipping 12/17/15  Stable post-op Ct with some air intracranial cavity.  Cont Keppra for now  Will order EEG  Cont Nimodipine  Check transcranial Doppler study for vasospasm  Hypertensive Emergency  BP 171/78 on arrival to ED in setting of neurologic symptoms  SBP goal < 160  Home meds: triamterene-HCTZ 37.5/25, resume when taking PO  Hyperglycemia  Gluc 155  HgbA1c 5.7  Hyperlipidemia  Home meds:  No statin  LDL 170 mg%  Other Stroke Risk Factors  Obesity, Body mass index is 38.76 kg/(m^2).   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 12/18/2015 1:59 PM I have personally examined this patient, reviewed notes, independently viewed imaging studies, participated in medical decision making and plan of care. I have made any additions or clarifications directly to the above note. Agree with note above. I had a long discussion the bedside with the patient's husband and other family members and answered questions. Plan to check EEG and transcranial Doppler study.  Discussed with  Dr. Cyndy Freeze This patient is critically ill and at significant risk of neurological worsening, death and care requires constant monitoring of vital signs, hemodynamics,respiratory and cardiac monitoring, extensive review of multiple databases, frequent neurological assessment, discussion with family, other specialists and medical decision making of high complexity.I have made any additions or clarifications directly to the above note.This critical care time does not reflect procedure time, or teaching time or supervisory time of PA/NP/Med Resident etc but could involve care discussion time.  I spent 30 minutes of neurocritical care time  in the care of  this patient.     Antony Contras, MD Medical Director [facility] Pager: [phone] 12/18/2015 3:45 PM

## 2015-12-18 NOTE — Progress Notes (Signed)
Pt seen and examined. Has been very lethargic postop. CTH completed last night.  EXAM: Temp:  [97 F (36.1 C)-99 F (37.2 C)] 97.9 F (36.6 C) (05/31 0800) Pulse Rate:  [63-101] 93 (05/31 0900) Resp:  [13-34] 17 (05/31 0900) BP: (99-153)/(37-118) 107/46 mmHg (05/31 0900) SpO2:  [87 %-100 %] 95 % (05/31 0900) Arterial Line BP: (96-170)/(44-71) 126/44 mmHg (05/31 0900) FiO2 (%):  [40 %] 40 % (05/30 1646) Intake/Output      05/30 0701 - 05/31 0700 05/31 0701 - 06/01 0700   P.O.     I.V. (mL/kg) 7477.8 (68.7)    IV Piggyback 110    Total Intake(mL/kg) 7587.8 (69.7)    Urine (mL/kg/hr) 1830 (0.7) 225 (0.9)   Emesis/NG output     Stool     Blood 500 (0.2)    Total Output 2330 225   Net +5257.8 -225         Somnolent, arousable with stimulation Can say name Moves all extremities to command Wound c/d/i, no leak  LABS: Lab Results  Component Value Date   CREATININE 0.81 12/17/2015   BUN 17 12/17/2015   NA 139 12/17/2015   K 3.7 12/17/2015   CL 112* 12/17/2015   CO2 21* 12/17/2015   Lab Results  Component Value Date   WBC 16.4* 12/17/2015   HGB 13.1 12/17/2015   HCT 40.2 12/17/2015   MCV 83.1 12/17/2015   PLT 265 12/17/2015    IMAGING: CTH reviewed, no large territory infarction. Decreased ventriculomegaly  IMPRESSION: - 60 y.o. female s/p clipping of right PICA aneurysm. Remains very somnolent but improved from immediate postop.  PLAN: - Cont to monitor - Nimotop/TCDs - Keep SBP < 146mmHg for today (POD#1)

## 2015-12-18 NOTE — Progress Notes (Signed)
Transcranial Doppler  Date POD PCO2 HCT BP  MCA ACA PCA OPHT SIPH VERT Basilar  12/18/15 MS     Right  Left   25  54   -24  -44   30  14   16  15    *  *   -37  -21   *           Right  Left                                            Right  Left                                             Right  Left                                             Right  Left                                            Right  Left                                            Right  Left                                        MCA = Middle Cerebral Artery      OPHT = Opthalmic Artery     BASILAR = Basilar Artery   ACA = Anterior Cerebral Artery     SIPH = Carotid Siphon PCA = Posterior Cerebral Artery   VERT = Verterbral Artery                   Normal MCA = 62+\-12 ACA = 50+\-12 PCA = 42+\-23   *Unable to insonate.  12/18/2015 4:46 PM Maudry Mayhew, RVT, RDCS, RDMS

## 2015-12-18 NOTE — Progress Notes (Signed)
SLP Cancellation Note  Patient Details Name: Kelly Wilson MRN: [id number] DOB: [birth year]   Cancelled treatment:        RN stated pt is unable to maintain adequate alertness for swallow assessment this afternoon. Will follow up next date.   [name] 12/18/2015, 2:25 PM  [name].[employer] [phone]

## 2015-12-19 ENCOUNTER — Inpatient Hospital Stay (HOSPITAL_COMMUNITY): Payer: BLUE CROSS/BLUE SHIELD

## 2015-12-19 ENCOUNTER — Encounter (HOSPITAL_COMMUNITY): Payer: Self-pay | Admitting: Neurosurgery

## 2015-12-19 DIAGNOSIS — G9340 Encephalopathy, unspecified: Secondary | ICD-10-CM

## 2015-12-19 DIAGNOSIS — R1310 Dysphagia, unspecified: Secondary | ICD-10-CM

## 2015-12-19 DIAGNOSIS — I69319 Unspecified symptoms and signs involving cognitive functions following cerebral infarction: Secondary | ICD-10-CM

## 2015-12-19 DIAGNOSIS — G911 Obstructive hydrocephalus: Secondary | ICD-10-CM

## 2015-12-19 DIAGNOSIS — I615 Nontraumatic intracerebral hemorrhage, intraventricular: Secondary | ICD-10-CM

## 2015-12-19 DIAGNOSIS — I607 Nontraumatic subarachnoid hemorrhage from unspecified intracranial artery: Secondary | ICD-10-CM

## 2015-12-19 MED ORDER — GABAPENTIN 250 MG/5ML PO SOLN
900.0000 mg | Freq: Three times a day (TID) | ORAL | Status: DC
  Administered 2015-12-19 – 2015-12-22 (×9): 900 mg via ORAL
  Filled 2015-12-19 (×13): qty 20

## 2015-12-19 MED ORDER — VALPROATE SODIUM 500 MG/5ML IV SOLN
500.0000 mg | Freq: Every day | INTRAVENOUS | Status: DC
  Filled 2015-12-19: qty 5

## 2015-12-19 MED ORDER — PANTOPRAZOLE SODIUM 40 MG PO PACK
40.0000 mg | PACK | Freq: Every day | ORAL | Status: DC
  Administered 2015-12-19: 40 mg
  Filled 2015-12-19: qty 20

## 2015-12-19 NOTE — Evaluation (Signed)
Clinical/Bedside Swallow Evaluation Patient Details  Name: Kelly Wilson MRN: [id number] Date of Birth: [birth year]  Today's Date: 12/19/2015 Time: SLP Start Time (ACUTE ONLY): [id number] SLP Stop Time (ACUTE ONLY): 0942 SLP Time Calculation (min) (ACUTE ONLY): 16 min  Past Medical History:  Past Medical History  Diagnosis Date  . Hyperlipidemia   . Hypertension   . 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 HYPERLIPIDEMIA 05/17/2007  . HYPERTENSION 05/17/2007  . GERD 08/09/2008  . OSTEOARTHRITIS 05/17/2007  . SPINAL STENOSIS 05/17/2007  . TRANSIENT ISCHEMIC ATTACK, HX OF 05/17/2007  . Left ear hearing loss     Mild   Past Surgical History:  Past Surgical History  Procedure Laterality Date  . Abdominal hysterectomy  1995  . Tubal ligation    . External ear surgery      left side x's 2  . Stapedectomy    . Throidectomy    . Orif elbow fracture  02/2011  . Skin graft full thickness arm    . Skin graft full thickness leg    . Cesarean section  1993  . Ankle surgery  2005  . Thyroidectomy, partial Right 1980  . Knee surgery  02/2011  . Craniotomy Right 12/17/2015    Procedure: Suboccipital CRANIectomy INTRACRANIAL ANEURYSM FOR VERTEBRAL/BASILAR;  Surgeon: Kelly Lose, MD;  Location: [facility];  Service: Neurosurgery;  Laterality: Right;  suboccipital   HPI:  60 y.o. female history of hypertension, hyperlipidemia, TIA, GERD, spinal stenosis presenting with new onset headache with nausea and mild vertigo. CT scan showed acute hemorrhage involving basilar cisterns, third and fourth ventricles and extending into lateral ventricles. Mass effect on brainstem was noted. Pt underwent craniotomy clipping for intracranial aneurysm. CXR Low lung volumes with mild bibasilar atelectasis.   Assessment / Plan / Recommendation Clinical Impression  Paged by RN that pt is alert and conversive. Flat affect, obvious visual disturbance required tactile assist during cup/straw sips thin and manipulation  of spoon with applesauce. Reduced oral manipulation and prep with solid trials. Total volume thin water consumed approximately 8-10 oz without cough, throat clear, wet vocal quality or change in vital signs. Silent aspiration cannot be excluded however is not suspected during this evaluation. Educated pt and spouse to comply with precautions with Dys 2/thin for upright position, no straws, crush meds, adequate alertness. Continued ST intervention.        Aspiration Risk  Moderate aspiration risk    Diet Recommendation Dysphagia 2 (Fine chop);Thin liquid   Liquid Administration via: Cup;No straw Medication Administration: Crushed with puree Supervision: Patient able to self feed;Full supervision/cueing for compensatory strategies;Staff to assist with self feeding Compensations: Minimize environmental distractions;Slow rate;Small sips/bites;Lingual sweep for clearance of pocketing Postural Changes: Seated upright at 90 degrees    Other  Recommendations Oral Care Recommendations: Oral care BID   Follow up Recommendations  Inpatient Rehab    Frequency and Duration min 2x/week  2 weeks       Prognosis Prognosis for Safe Diet Advancement: Good Barriers to Reach Goals: Cognitive deficits      Swallow Study   General HPI: 60 y.o. female history of hypertension, hyperlipidemia, TIA, GERD, spinal stenosis presenting with new onset headache with nausea and mild vertigo. CT scan showed acute hemorrhage involving basilar cisterns, third and fourth ventricles and extending into lateral ventricles. Mass effect on brainstem was noted. Pt underwent craniotomy clipping for intracranial aneurysm. CXR Low lung volumes with mild bibasilar atelectasis. Type of Study: Bedside Swallow Evaluation Previous Swallow Assessment:  (none) Diet  Prior to this Study: NPO;NG Tube (feeding not initiated) Temperature Spikes Noted: No Respiratory Status: Nasal cannula History of Recent Intubation: Yes Length of  Intubations (days):  (hours only for surgery ) Date extubated: 12/17/15 Behavior/Cognition: Alert;Cooperative;Confused;Pleasant mood;Requires cueing Oral Cavity Assessment: Within Functional Limits Oral Care Completed by SLP: Yes Oral Cavity - Dentition: Adequate natural dentition Vision: Impaired for self-feeding Self-Feeding Abilities: Able to feed self;Needs assist;Needs set up Patient Positioning: Upright in bed Baseline Vocal Quality: Low vocal intensity Volitional Cough: Strong Volitional Swallow: Able to elicit    Oral/Motor/Sensory Function Overall Oral Motor/Sensory Function: Within functional limits   Ice Chips Ice chips: Not tested   Thin Liquid Thin Liquid: Impaired Presentation: Cup;Straw Oral Phase Impairments: Reduced labial seal Oral Phase Functional Implications: Right anterior spillage Pharyngeal  Phase Impairments: Suspected delayed Swallow    Nectar Thick Nectar Thick Liquid: Not tested   Honey Thick Honey Thick Liquid: Not tested   Puree Puree: Within functional limits   Solid   GO   Solid: Impaired Oral Phase Impairments: Reduced lingual movement/coordination Oral Phase Functional Implications: Prolonged oral transit;Impaired mastication        Kelly Wilson 12/19/2015,10:26 AM   Kelly Wilson.Ed Kelly Wilson [phone]

## 2015-12-19 NOTE — Consult Note (Signed)
Physical Medicine and Rehabilitation Consult Reason for Consult: Intraventricular ingestion or hemorrhage Referring Physician: Dr. Leonie Man   HPI: Kelly Wilson is a 60 y.o. right handed female with history of hypertension, TIA. Presented 12/16/2015 with acute onset of headache nausea as well as vertigo. Per chart review patient married independent prior to admission. CT of the head showed acute hemorrhage involving basilar cisterns, third and fourth ventricles and extending into lateral ventricles. Mass effect on brainstem was noted with early hydrocephalus. 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 Kathyrn Sheriff. Placed on Keppra for seizure prophylaxis. EEG is pending. Decadron protocol as indicated. Close monitoring of blood pressure with Cardene drip. Nasogastric tube in place and diet advanced to Dysphagia #2 thin liquid diet. Physical occupational therapy evaluation pending. M.D. has requested physical medicine rehabilitation consult.  Spoke with patient's RN, she notes  That the patient has been alert for short periods of time during the day. She usually requires arousal  Using physical stimulation. No PT or OT consults have been performed yet.  Review of Systems  Constitutional: Negative for fever and chills.  HENT: Positive for hearing loss.   Eyes: Positive for blurred vision and double vision.  Respiratory: Negative for cough and shortness of breath.   Cardiovascular: Positive for leg swelling. Negative for chest pain and palpitations.  Gastrointestinal: Positive for constipation. Negative for nausea and vomiting.       GERD  Genitourinary: Negative for dysuria and hematuria.  Musculoskeletal: Positive for myalgias.  Skin: Negative for rash.  Neurological: Positive for dizziness and  headaches. Negative for loss of consciousness.  All other systems reviewed and are negative.  Past Medical History  Diagnosis Date  . Hyperlipidemia   . Hypertension   . 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 HYPERLIPIDEMIA 05/17/2007  . HYPERTENSION 05/17/2007  . GERD 08/09/2008  . OSTEOARTHRITIS 05/17/2007  . SPINAL STENOSIS 05/17/2007  . TRANSIENT ISCHEMIC ATTACK, HX OF 05/17/2007  . Left ear hearing loss     Mild   Past Surgical History  Procedure Laterality Date  . Abdominal hysterectomy  1995  . Tubal ligation    . External ear surgery      left side x's 2  . Stapedectomy    . Throidectomy    . Orif elbow fracture  02/2011  . Skin graft full thickness arm    . Skin graft full thickness leg    . Cesarean section  1993  . Ankle surgery  2005  . Thyroidectomy, partial Right 1980  . Knee surgery  02/2011  . Craniotomy Right 12/17/2015    Procedure: Suboccipital CRANIectomy INTRACRANIAL ANEURYSM FOR VERTEBRAL/BASILAR;  Surgeon: Consuella Lose, MD;  Location: [facility];  Service: Neurosurgery;  Laterality: Right;  suboccipital   Family History  Problem Relation Age of Onset  . Coronary artery disease Mother   . Diabetes Mother   . Coronary artery disease Father   . Diabetes Sister     2 sister with DM  . Cancer Other     uncle had rectal cancer  . Hypertension Other   . Hyperlipidemia Other    Social History:  reports that she has never smoked. She does not have any smokeless tobacco history on file. She reports that she does not drink alcohol or use illicit drugs. Allergies:  Allergies  Allergen Reactions  . Morphine Hives  . Rosuvastatin Other (See Comments)    myalgia   Medications Prior to Admission  Medication Sig Dispense Refill  . gabapentin (NEURONTIN) 300 MG capsule Take 3 capsules (900 mg total) by mouth 3 (three) times daily. 270 capsule 5  . magnesium gluconate (MAGONATE) 500 MG tablet Take 1 tablet (500 mg total) by mouth daily.    Marland Kitchen omeprazole  (PRILOSEC) 40 MG capsule Take 40 mg by mouth daily.      Marland Kitchen triamterene-hydrochlorothiazide (MAXZIDE-25) 37.5-25 MG per tablet Take 1 tablet by mouth daily. 90 tablet 3  . [DISCONTINUED] aspirin 325 MG tablet Take 325 mg by mouth daily.      Home: Home Living Family/patient expects to be discharged to:: Private residence Living Arrangements: Spouse/significant other Type of Home: House  Lives With: Spouse  Functional History:   Functional Status:  Mobility:          ADL:    Cognition: Cognition Overall Cognitive Status: Impaired/Different from baseline Arousal/Alertness: Awake/alert Orientation Level: Oriented to person, Oriented to place, Oriented to time, Disoriented to situation (UTA- minimally verbal-mumbles only) Attention: Sustained Sustained Attention: Impaired Sustained Attention Impairment: Verbal basic, Functional basic Memory: Impaired Memory Impairment: Decreased recall of new information, Decreased short term memory (did recall place/stroke after 15 min delay) Awareness: Impaired Awareness Impairment: Intellectual impairment, Emergent impairment, Anticipatory impairment Problem Solving: Impaired Problem Solving Impairment: Verbal basic, Functional basic Behaviors: Perseveration Safety/Judgment: Impaired Cognition Overall Cognitive Status: Impaired/Different from baseline  Blood pressure 144/59, pulse 94, temperature 98.1 F (36.7 C), temperature source Oral, resp. rate 16, height 5\' 6"  (1.676 m), weight 108.863 kg (240 lb), SpO2 100 %. Physical Exam  HENT:  Head: Normocephalic.  Nasogastric tube in place  Eyes:  Pupils reactive to light  Neck: Normal range of motion. Neck supple. No thyromegaly present.  Cardiovascular: Normal rate and regular rhythm.   Respiratory: Effort normal and breath sounds normal.  GI: Soft. Bowel sounds are normal. She exhibits no distension.  Neurological:  Level of alertness varies. Patient does have some perseveration.  She was able to provide person and place. Follows some simple commands but would fall back asleep during exam  Skin: Skin is warm and dry.  Patient can raise both arms overhead to command but then will not repeat this. She did not cooperate with remainder of manual muscle testing of the upper and lower limbs. She opens her eyes brieflyto command. She opens her mouth to command and sticks out her tongue  She did not track to command. Sensory testing could not be performed secondary to mental status  No results found for this or any previous visit (from the past 24 hour(s)). Ct Head Wo Contrast  12/17/2015  CLINICAL DATA:  Initial evaluation for acute postoperative lethargy lethargy, expressive aphasia. EXAM: CT HEAD WITHOUT CONTRAST TECHNIQUE: Contiguous axial images were obtained from the base of the skull through the vertex without intravenous contrast. COMPARISON:  Prior study from 12/16/2015. FINDINGS: Postoperative changes from interval suboccipital craniectomy for surgical clipping of a right high CT aneurysm are seen. Surgical clip in place Ing region of previously identified aneurysm associated pneumocephalus with gas extending into the adjacent fourth ventricle. Scattered pneumocephalus present throughout the basilar cisterns and overlying the cerebral convexities. Intraventricular hemorrhage is overall stable to slightly decreased from previous exam with blood seen layering within the fourth ventricle, third ventricle, and lateral ventricles. Ventricular dilatation is slightly improved, although there is still felt to be hydrocephalus  with persistent small amount transependymal flow of CSF. Crowding of the basilar cisterns and at the foramen magnum, stable to slightly improved. No midline shift. No definite evolving large vessel territory infarct identified. Postoperative changes within the suboccipital scalp. No acute abnormality about the orbits. Small amount of layering opacity layering within  the sphenoid sinuses. Paranasal sinuses are otherwise clear. No mastoid effusion. IMPRESSION: 1. Postoperative sequela of interval suboccipital craniectomy for surgical clipping of a right PICA territory aneurysm. 2. Persistent but slightly improved hydrocephalus as compared to preoperative exam. Overall intraventricular hemorrhage is stable to slightly improved. Crowding of the basilar cisterns also stable to slightly improved. 3. No definite evolving large vessel territory infarct or other complication. Electronically Signed   By: Jeannine Boga M.D.   On: 12/17/2015 22:32   Dg Abd Portable 1v  12/18/2015  CLINICAL DATA:  NG tube placement. EXAM: PORTABLE ABDOMEN - 1 VIEW COMPARISON:  None. FINDINGS: There is evidence of patient's NG tube with tip over the stomach in the left upper quadrant as the non radiopaque side port is in the region of the gastroesophageal junction. Bowel gas pattern is nonobstructive. There are mild degenerative changes of the spine. IMPRESSION: No acute findings. Nasogastric tube with tip over the stomach in the left upper quadrant and side-port in the region of the gastroesophageal junction. Electronically Signed   By: Marin Olp M.D.   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 Does the need for close, 24 hr/day medical supervision in concert with the patient's rehab needs make it unreasonable for this patient to be served in a less intensive setting? Yes 2. Co-Morbidities requiring supervision/potential complications: Cognitive deficits, swallowing impairment, hypertension 3. Due to bladder management, bowel management, safety, skin/wound care, disease management, medication administration, pain management and patient education, does the patient require 24 hr/day rehab nursing? Yes 4. Does the patient require coordinated care of a physician, rehab  nurse, PT (1-2 hrs/day, 5 days/week), OT (1-2 hrs/day, 5 days/week) and SLP (.5-1 hrs/day, 5 days/week) to address physical and functional deficits in the context of the above medical diagnosis(es)? Yes Addressing deficits in the following areas: balance, endurance, locomotion, strength, transferring, bowel/bladder control, bathing, dressing, feeding, grooming, toileting, cognition, speech, language, swallowing and psychosocial support 5. Can the patient actively participate in an intensive therapy program of at least 3 hrs of therapy per day at least 5 days per week? No 6. The potential for patient to make measurable gains while on inpatient rehab is currently poor 7. Anticipated functional outcomes upon discharge from inpatient rehab are n/a  with PT, n/a with OT, n/a with SLP. 8. Estimated rehab length of stay to reach the above functional goals is: Not applicable 9. Does the patient have adequate social supports and living environment to accommodate these discharge functional goals? Yes 10. Anticipated D/C setting: Home 11. Anticipated post D/C treatments: [facility] therapy 12. Overall Rehab/Functional Prognosis: good  RECOMMENDATIONS: This patient's condition is appropriate for continued rehabilitative care in the following setting: CIR once tolerating and participating  in PT and OT Patient has agreed to participate in recommended program. Yes Note that insurance prior authorization may be required for reimbursement for recommended care.  Comment: We will follow along, await PT OT evaluations.    12/19/2015

## 2015-12-19 NOTE — Evaluation (Addendum)
Speech Language Pathology Evaluation Patient Details Name: Kelly Wilson MRN: [id number] DOB: 1956-03-20 Today's Date: 12/19/2015 Time: 0943-1000 SLP Time Calculation (min) (ACUTE ONLY): 17 min  Problem List:  Patient Active Problem List   Diagnosis Date Noted  . Obstructive hydrocephalus 12/17/2015  . 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[place]) 12/17/2015  . Headache 12/17/2015  . IVH (intraventricular hemorrhage) ([place]) 12/16/2015  . Chest pain 05/10/2015  . Precordial chest pain 05/10/2015  . Chest wall pain 05/10/2015  . Pain in the chest   . Wellness examination 01/24/2015  . Myalgia 11/14/2014  . Muscle cramps 11/14/2014  . Pinched nerve 08/28/2014  . Routine general medical examination at a health care facility 08/12/2013  . Cough 04/21/2012  . Lymphadenitis, acute 10/23/2011  . GERD 08/09/2008  . OTHER DYSPHAGIA 06/18/2008  . 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 NECK DISORDER 05/22/2008  . HEADACHE 08/16/2007  . OTITIS MEDIA, ACUTE, LEFT 08/11/2007  . OTHER ABNORMAL BLOOD CHEMISTRY 07/25/2007  . HYPERLIPIDEMIA 05/17/2007  . Essential hypertension 05/17/2007  . OSTEOARTHRITIS 05/17/2007  . SPINAL STENOSIS 05/17/2007  . History of cardiovascular disorder 05/17/2007   Past Medical History:  Past Medical History  Diagnosis Date  . Hyperlipidemia   . Hypertension   . 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 HYPERLIPIDEMIA 05/17/2007  . HYPERTENSION 05/17/2007  . GERD 08/09/2008  . OSTEOARTHRITIS 05/17/2007  . SPINAL STENOSIS 05/17/2007  . TRANSIENT ISCHEMIC ATTACK, HX OF 05/17/2007  . Left ear hearing loss     Mild   Past Surgical History:  Past Surgical History  Procedure Laterality Date  . Abdominal hysterectomy  1995  . Tubal ligation    . External ear surgery      left side x's 2  . Stapedectomy    . Throidectomy    . Orif elbow fracture  02/2011  . Skin graft full thickness arm    . Skin graft full thickness leg    . Cesarean section  1993  . Ankle  surgery  2005  . Thyroidectomy, partial Right 1980  . Knee surgery  02/2011  . Craniotomy Right 12/17/2015    Procedure: Suboccipital CRANIectomy INTRACRANIAL ANEURYSM FOR VERTEBRAL/BASILAR;  Surgeon: Consuella Lose, MD;  Location: [place];  Service: Neurosurgery;  Laterality: Right;  suboccipital   HPI:  60 y.o. female history of hypertension, hyperlipidemia, TIA, GERD, spinal stenosis presenting with new onset headache with nausea and mild vertigo. CT scan showed acute hemorrhage involving basilar cisterns, third and fourth ventricles and extending into lateral ventricles. Mass effect on brainstem was noted. Pt underwent craniotomy clipping for intracranial aneurysm. CXR Low lung volumes with mild bibasilar atelectasis.   Assessment / Plan / Recommendation Clinical Impression  Able to maintain alertness majority of evaluation. Pt noted to be verbally perseverative with numbers and phrases. She exhibits cognitive-linguistic impairments in most areas and requires mod-max multimodal cues during functional communication and activity. Appears to have visual disturbance and states she sees "2" of me. She would benefit from inpatient rehab. ST will continue while on acute.        SLP Assessment  Patient needs continued Speech Lanaguage Pathology Services    Follow Up Recommendations  Inpatient Rehab    Frequency and Duration min 2x/week  2 weeks      SLP Evaluation Prior Functioning  Cognitive/Linguistic Baseline: Within functional limits Type of Home: House  Lives With: Spouse Vocation: Full time employment Actuary)   Cognition  Overall Cognitive Status: Impaired/Different from baseline Arousal/Alertness: Awake/alert Orientation Level:  Oriented to person;Disoriented to time;Oriented to place;Oriented to time (ox to time/place fluctuating) Attention: Sustained Sustained Attention: Impaired Sustained Attention Impairment: Verbal basic;Functional  basic Memory: Impaired Memory Impairment: Decreased recall of new information;Decreased short term memory (did recall place/stroke after 15 min delay) Awareness: Impaired Awareness Impairment: Intellectual impairment;Emergent impairment;Anticipatory impairment Problem Solving: Impaired Problem Solving Impairment: Verbal basic;Functional basic Behaviors: Perseveration Safety/Judgment: Impaired    Comprehension  Auditory Comprehension Overall Auditory Comprehension: Appears within functional limits for tasks assessed [facility] for 2 step commands) Interfering Components: Attention Visual Recognition/Discrimination Discrimination: Not tested Reading Comprehension Reading Status:  (TBA)    Expression Expression Primary Mode of Expression: Verbal Verbal Expression Overall Verbal Expression: Impaired Initiation: Impaired Level of Generative/Spontaneous Verbalization: Sentence Repetition:  (TBA) Naming: No impairment Pragmatics: Impairment Impairments: Abnormal affect;Eye contact;Topic appropriateness Interfering Components: Attention Written Expression Dominant Hand: Right Written Expression: Not tested   Oral / Motor  Oral Motor/Sensory Function Overall Oral Motor/Sensory Function: Within functional limits Motor Speech Overall Motor Speech: Appears within functional limits for tasks assessed Respiration: Within functional limits Phonation: Normal Resonance: Within functional limits Articulation: Within functional limitis Intelligibility: Intelligible Motor Planning: Witnin functional limits   GO                    [name] 12/19/2015, 10:46 AM   [name].Ed [employer] [phone]

## 2015-12-19 NOTE — Progress Notes (Signed)
STROKE TEAM PROGRESS NOTE   SUBJECTIVE (INTERVAL HISTORY) Patient is more mentally alert and interactive today. She follows commands well. She did not pass swallow eval yesterday but looks like she may do better today. Blood pressure has been adequately controlled.   OBJECTIVE Temp:  [97.5 F (36.4 C)-99.4 F (37.4 C)] 98.1 F (36.7 C) (06/01 0806) Pulse Rate:  [83-100] 84 (06/01 0900) Cardiac Rhythm:  [-] Normal sinus rhythm (06/01 0731) Resp:  [11-23] 21 (06/01 0900) BP: (102-151)/(30-72) 133/65 mmHg (06/01 0900) SpO2:  [90 %-100 %] 99 % (06/01 0900) Arterial Line BP: (117-155)/(42-57) 117/42 mmHg (05/31 1500)  CBC:   Recent Labs Lab 12/16/15 0355 12/17/15 2145  WBC 7.1 16.4*  NEUTROABS 2.6  --   HGB 15.0 13.1  HCT 44.6 40.2  MCV 81.7 83.1  PLT 268 99991111    Basic Metabolic Panel:   Recent Labs Lab 12/17/15 0509 12/17/15 2145  NA 137 139  K 3.7 3.7  CL 106 112*  CO2 22 21*  GLUCOSE 158* 152*  BUN 15 17  CREATININE 0.77 0.81  CALCIUM 9.2 8.3*    Lipid Panel:     Component Value Date/Time   CHOL 263* 05/10/2015 0830   TRIG 83 05/10/2015 0830   HDL 76 05/10/2015 0830   CHOLHDL 3.5 05/10/2015 0830   VLDL 17 05/10/2015 0830   LDLCALC 170* 05/10/2015 0830   HgbA1c:  Lab Results  Component Value Date   HGBA1C 5.7* 12/17/2015   Urine Drug Screen: No results found for: LABOPIA, COCAINSCRNUR, LABBENZ, AMPHETMU, THCU, LABBARB   IMAGING  Ct Head Wo Contrast  12/17/2015  CLINICAL DATA:  Initial evaluation for acute postoperative lethargy lethargy, expressive aphasia. EXAM: CT HEAD WITHOUT CONTRAST TECHNIQUE: Contiguous axial images were obtained from the base of the skull through the vertex without intravenous contrast. COMPARISON:  Prior study from 12/16/2015. FINDINGS: Postoperative changes from interval suboccipital craniectomy for surgical clipping of a right high CT aneurysm are seen. Surgical clip in place Ing region of previously identified aneurysm  associated pneumocephalus with gas extending into the adjacent fourth ventricle. Scattered pneumocephalus present throughout the basilar cisterns and overlying the cerebral convexities. Intraventricular hemorrhage is overall stable to slightly decreased from previous exam with blood seen layering within the fourth ventricle, third ventricle, and lateral ventricles. Ventricular dilatation is slightly improved, although there is still felt to be hydrocephalus with persistent small amount transependymal flow of CSF. Crowding of the basilar cisterns and at the foramen magnum, stable to slightly improved. No midline shift. No definite evolving large vessel territory infarct identified. Postoperative changes within the suboccipital scalp. No acute abnormality about the orbits. Small amount of layering opacity layering within the sphenoid sinuses. Paranasal sinuses are otherwise clear. No mastoid effusion. IMPRESSION: 1. Postoperative sequela of interval suboccipital craniectomy for surgical clipping of a right PICA territory aneurysm. 2. Persistent but slightly improved hydrocephalus as compared to preoperative exam. Overall intraventricular hemorrhage is stable to slightly improved. Crowding of the basilar cisterns also stable to slightly improved. 3. No definite evolving large vessel territory infarct or other complication. Electronically Signed   By: Jeannine Boga M.D.   On: 12/17/2015 22:32   Dg Abd Portable 1v  12/18/2015  CLINICAL DATA:  NG tube placement. EXAM: PORTABLE ABDOMEN - 1 VIEW COMPARISON:  None. FINDINGS: There is evidence of patient's NG tube with tip over the stomach in the left upper quadrant as the non radiopaque side port is in the region of the gastroesophageal junction. Bowel gas pattern  is nonobstructive. There are mild degenerative changes of the spine. IMPRESSION: No acute findings. Nasogastric tube with tip over the stomach in the left upper quadrant and side-port in the region of the  gastroesophageal junction. Electronically Signed   By: Marin Olp M.D.   On: 12/18/2015 07:25    PHYSICAL EXAM [name] drowsy, but responding to commands, some disorientation. Not in distress. . Afebrile. Head is nontraumatic. Cardiac exam no murmur or gallop. Lungs are clear to auscultation- anteriorly.  Neurological Exam ;  Awake and interactive.slightly hypophonic speech and follows commands well.. Not spontaneously opening her eyes,   following commands well, Answered simple questions. Hearing appears normal. Face symmetric. Moving all extremities. Normal strength, tone, reflexes and coordination.but appears to move left side slightly less than the right. Normal sensation. Gait deferred.  ASSESSMENT/PLAN Kelly Wilson is a 60 y.o. female with history of HTN, HLD and TIA presenting with new onset headache with nausea and mild vertigo. CT showed acute IVH.  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  Resultant  No Deficits  CT head  Hemorrhage in basilar cisterns, third and fourth, and lateral ventriclar hemorrhage. mass effect on the brainstem. 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 Improved hydrocephalus. IVH stable to slightly improved. No large infarct.  Carotid Doppler - Mild intimal thickening with mild heterogenenous plaque in bilateral internal carotid arteries without significant stenosis noted 1-39%  2D Echo  not ordered  LDL 170 mg    HgbA1c 5.7  SCDs for VTE prophylaxis Diet NPO time specified, ST assessing. Recommend increase to Dys 2 chopped meats w/ thin liquids   aspirin 325 mg daily prior to  admission  Ongoing aggressive stroke risk factor management  Therapy recommendations:  Pending, order PT eval when appropriate.  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  Cont Keppra for now  EEG ordered    Cont Nimodipine  Check transcranial Doppler study for vasospasm yesterday and today  Hypertensive Emergency  BP 171/78 on arrival to ED in setting of neurologic symptoms  SBP goal < 180   Home meds: triamterene-HCTZ 37.5/25, resume when taking PO  Hyperglycemia  Gluc 155  HgbA1c 5.7  Hyperlipidemia  Home meds:  No statin  LDL 170  Hold statin d/t ICH  Other Stroke Risk Factors  Obesity, Body mass index is 38.76 kg/(m^2).   Hx stroke/TIA  11/2001 - R brain TIA  Migraines  Other Active Problems  Chronic muscle cramps and myalgia on gabapentin  Hypokalemia, resolved  Leukocytosis- likely post [facility] day # 3   I have personally examined this patient, reviewed notes, independently viewed imaging studies, participated in medical decision making and plan of care. I have made any additions or clarifications directly to the above note. Transcranial Doppler study shows low velocity is an increased pulsatility indexes but no definite evidence of vasospasm. Plan to mobilize out of bed today. Change blood pressure goal for systolic below 99991111 .Physical occupational speech therapy consults. Start on diet and oral medications after she passes swallow eval. She likely will need to transfer to inpatient rehabilitation over  the next few days. Discussed with patient, husband and Dr Kathyrn Sheriff, and answered questions.  This patient is critically ill and at significant risk of neurological worsening, death and care requires constant monitoring of vital signs, hemodynamics,respiratory and cardiac monitoring, extensive review of multiple databases, frequent neurological assessment,  discussion with family, other specialists and medical decision making of high complexity.I have made any additions or clarifications directly to the above note.This critical care time does not reflect procedure time, or teaching time or supervisory time of PA/NP/Med Resident etc but could involve care discussion time.  I spent 30 minutes of neurocritical care time  in the care of  this patient.     Antony Contras, MD Medical Director [facility] Pager: [phone] 12/19/2015 1:53 PM

## 2015-12-19 NOTE — Progress Notes (Signed)
SLP Cancellation Note  Patient Details Name: Kelly Wilson MRN: [id number] DOB: 04/22/1956   Cancelled treatment:        Unable to arouse with max multimodal cues. Will continue to follow.   [name] 12/19/2015, 9:11 AM   [name].Ed [employer] [phone]

## 2015-12-19 NOTE — Progress Notes (Signed)
Bedside EEG completed, results pending. 

## 2015-12-19 NOTE — Progress Notes (Signed)
Patient unable to void post foley removal yesterday. Straight cath performed x2 during night with large amounts of clear yellow urine. Will continue to monitor.

## 2015-12-19 NOTE — Progress Notes (Signed)
Pt seen and examined. No issues overnight. Pt more awake, denies HA.  EXAM: Temp:  [97.5 F (36.4 C)-99.4 F (37.4 C)] 98.1 F (36.7 C) (06/01 0806) Pulse Rate:  [83-100] 84 (06/01 0900) Resp:  [11-23] 21 (06/01 0900) BP: (102-151)/(30-72) 133/65 mmHg (06/01 0900) SpO2:  [90 %-100 %] 99 % (06/01 0900) Arterial Line BP: (117-155)/(42-57) 117/42 mmHg (05/31 1500) Intake/Output      05/31 0701 - 06/01 0700 06/01 0701 - 06/02 0700   I.V. (mL/kg) 3715 (34.1) 250 (2.3)   NG/GT 60    IV Piggyback 205    Total Intake(mL/kg) 3980 (36.6) 250 (2.3)   Urine (mL/kg/hr) 2905 (1.1)    Stool 0 (0)    Blood     Total Output 2905     Net +1075 +250        Stool Occurrence 1 x     Easily arousable. Oriented to person, hospital, intermittently to year Speech slowed, minimally dysarthric CN intact Good strength throughout, following commands briskly  LABS: Lab Results  Component Value Date   CREATININE 0.81 12/17/2015   BUN 17 12/17/2015   NA 139 12/17/2015   K 3.7 12/17/2015   CL 112* 12/17/2015   CO2 21* 12/17/2015   Lab Results  Component Value Date   WBC 16.4* 12/17/2015   HGB 13.1 12/17/2015   HCT 40.2 12/17/2015   MCV 83.1 12/17/2015   PLT 265 12/17/2015    IMPRESSION: - 60 y.o. female [place] d#4 with primarily IVH, POD#2 s/p clipping right PICA. Much more awake, interactive today.  PLAN: - will get PT/OT to start to mobilize - SLP eval, may be able to d/c NGT - Cont nimotop/TCDs

## 2015-12-20 ENCOUNTER — Inpatient Hospital Stay (HOSPITAL_COMMUNITY): Payer: BLUE CROSS/BLUE SHIELD

## 2015-12-20 DIAGNOSIS — I609 Nontraumatic subarachnoid hemorrhage, unspecified: Secondary | ICD-10-CM

## 2015-12-20 MED ORDER — PANTOPRAZOLE SODIUM 40 MG PO TBEC
40.0000 mg | DELAYED_RELEASE_TABLET | Freq: Every day | ORAL | Status: DC
  Administered 2015-12-20 – 2015-12-27 (×8): 40 mg via ORAL
  Filled 2015-12-20 (×8): qty 1

## 2015-12-20 MED ORDER — LEVETIRACETAM 500 MG PO TABS
500.0000 mg | ORAL_TABLET | Freq: Two times a day (BID) | ORAL | Status: DC
  Administered 2015-12-20 – 2015-12-27 (×15): 500 mg via ORAL
  Filled 2015-12-20 (×15): qty 1

## 2015-12-20 MED ORDER — VALPROATE SODIUM 250 MG/5ML PO SOLN
500.0000 mg | Freq: Every day | ORAL | Status: DC
  Administered 2015-12-20 – 2015-12-24 (×5): 500 mg via ORAL
  Filled 2015-12-20 (×6): qty 10

## 2015-12-20 NOTE — Procedures (Signed)
History: Kelly Wilson is an 60 y.o. female patient with altered mental status. Routine inpatient EEG was performed for further evaluation.   Patient Active Problem List   Diagnosis Date Noted  . Obstructive hydrocephalus 12/17/2015  . 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[place]) 12/17/2015  . Headache 12/17/2015  . IVH (intraventricular hemorrhage) ([place]) 12/16/2015  . Chest pain 05/10/2015  . Precordial chest pain 05/10/2015  . Chest wall pain 05/10/2015  . Pain in the chest   . Wellness examination 01/24/2015  . Myalgia 11/14/2014  . Muscle cramps 11/14/2014  . Pinched nerve 08/28/2014  . Routine general medical examination at a health care facility 08/12/2013  . Cough 04/21/2012  . Lymphadenitis, acute 10/23/2011  . GERD 08/09/2008  . OTHER DYSPHAGIA 06/18/2008  . 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 NECK DISORDER 05/22/2008  . HEADACHE 08/16/2007  . OTITIS MEDIA, ACUTE, LEFT 08/11/2007  . OTHER ABNORMAL BLOOD CHEMISTRY 07/25/2007  . HYPERLIPIDEMIA 05/17/2007  . Essential hypertension 05/17/2007  . OSTEOARTHRITIS 05/17/2007  . SPINAL STENOSIS 05/17/2007  . History of cardiovascular disorder 05/17/2007     Current facility-administered medications:  .   stroke: mapping our early stages of recovery book, , Does not apply, Once, Wallie Char .  0.9 %  sodium chloride infusion, , Intravenous, Once, Consuella Lose, MD .  0.9 % NaCl with KCl 40 mEq / L  infusion, , Intravenous, Continuous, Jovita Gamma, MD, Last Rate: 125 mL/hr at 12/20/15 0700 .  acetaminophen (TYLENOL) tablet 650 mg, 650 mg, Oral, Q4H PRN **OR** acetaminophen (TYLENOL) suppository 650 mg, 650 mg, Rectal, Q4H PRN, Wallie Char .  antiseptic oral rinse (CPC / CETYLPYRIDINIUM CHLORIDE 0.05%) solution 7 mL, 7 mL, Mouth Rinse, q12n4p, Consuella Lose, MD, 7 mL at 12/19/15 1600 .  chlorhexidine (PERIDEX) 0.12 % solution 15 mL, 15 mL, Mouth Rinse, BID, Consuella Lose, MD, 15 mL at 12/19/15  2147 .  dexamethasone (DECADRON) injection 4 mg, 4 mg, Intravenous, Q6H, Jovita Gamma, MD, 4 mg at 12/20/15 0803 .  gabapentin (NEURONTIN) 250 MG/5ML solution 900 mg, 900 mg, Oral, Q8H, Rachel L Rumbarger, RPH, 900 mg at 12/20/15 0600 .  HYDROmorphone (DILAUDID) injection 0.5-1 mg, 0.5-1 mg, Intravenous, Q2H PRN, Jovita Gamma, MD, 1 mg at 12/16/15 2114 .  labetalol (NORMODYNE,TRANDATE) injection 10-40 mg, 10-40 mg, Intravenous, Q10 min PRN, Wallie Char, 20 mg at 12/17/15 0818 .  levETIRAcetam (KEPPRA) tablet 500 mg, 500 mg, Oral, BID, Rachel L Rumbarger, RPH .  magnesium gluconate (MAGONATE) tablet 500 mg, 500 mg, Oral, Daily, Garvin Fila, MD, 500 mg at 12/19/15 0910 .  nicardipine (CARDENE) 20mg  in 0.86% saline 267ml IV infusion (0.1 mg/ml), 3-15 mg/hr, Intravenous, Continuous,  Fuller Mandril, MD, Stopped at 12/18/15 2200 .  niMODipine (NIMOTOP) capsule 60 mg, 60 mg, Oral, Q4H, 60 mg at 12/18/15 0120 **OR** NiMODipine (NYMALIZE) 60 MG/20ML oral solution 60 mg, 60 mg, Per Tube, Q4H, Consuella Lose, MD, 60 mg at 12/20/15 0803 .  ondansetron (ZOFRAN) injection 4-8 mg, 4-8 mg, Intravenous, Q6H PRN, Jovita Gamma, MD, 4 mg at 12/16/15 1953 .  pantoprazole sodium (PROTONIX) 40 mg/20 mL oral suspension 40 mg, 40 mg, Per Tube, Daily, Valeda Malm Rumbarger, RPH, 40 mg at 12/19/15 1135 .  Valproate Sodium (DEPAKENE) solution 500 mg, 500 mg, Oral, Daily, Para March, RPH   Introduction:  This is a 19 channel routine scalp EEG performed at the bedside with bipolar and monopolar montages arranged in accordance to the international 10/20 system of electrode placement. One  channel was dedicated to EKG recording.   Findings:  Generalized background slowing in the range of 4-5 Hz noted throughout the recording. No definite evidence of abnormal epileptiform discharges or electrographic seizures were noted during this recording. Intermittent jaw tremor and leg movements were  annotated by EEG tech during the EEG recording, not clearly visualized on the video recording. No EEG correlate to these described movements noted.   Impression:  Abnormal routine inpatient EEG suggestive of at least moderate encephalopathy secondary to generalized cerebral dysfunction, which can be seen due to medication/sedation effect, in addition to diffuse intracranial pathology as well as other systemic toxic metabolic etiology,. No seizures noted. Clinical correlation is recommended .

## 2015-12-20 NOTE — Progress Notes (Signed)
Pt seen and examined. No issues overnight. Much more awake this morning, no significant c/o.  EXAM: Temp:  [97.9 F (36.6 C)-99.8 F (37.7 C)] 98.5 F (36.9 C) (06/02 1149) Pulse Rate:  [72-95] 72 (06/02 0700) Resp:  [11-23] 16 (06/02 0700) BP: (103-157)/(51-82) 103/51 mmHg (06/02 0700) SpO2:  [95 %-100 %] 95 % (06/02 0700) Intake/Output      06/01 0701 - 06/02 0700 06/02 0701 - 06/03 0700   P.O. 800    I.V. (mL/kg) 3000 (27.6)    NG/GT 240    IV Piggyback 210    Total Intake(mL/kg) 4250 (39)    Urine (mL/kg/hr) 5450 (2.1)    Stool 0 (0)    Total Output 5450     Net -1200          Stool Occurrence 3 x     Awake, alert, oriented x3 Speech fluent, minimal perseveration CN grossly intact Good strength throughout Wound c/d/i, no leak  LABS: Lab Results  Component Value Date   CREATININE 0.81 12/17/2015   BUN 17 12/17/2015   NA 139 12/17/2015   K 3.7 12/17/2015   CL 112* 12/17/2015   CO2 21* 12/17/2015   Lab Results  Component Value Date   WBC 16.4* 12/17/2015   HGB 13.1 12/17/2015   HCT 40.2 12/17/2015   MCV 83.1 12/17/2015   PLT 265 12/17/2015    IMPRESSION: - 60 y.o. female [place] d# 5 s/p clipping of RPICA aneurysm. Doing well, not hydrocephalic.  PLAN: - Cont to mobilize - Cont Nimotop/TCD - May be ready for CIR likely on Mon

## 2015-12-20 NOTE — Progress Notes (Signed)
I await therapy evaluations so that I can pursue [employer] approval for an inpt rehab admission next week. [id number]

## 2015-12-20 NOTE — Progress Notes (Signed)
STROKE TEAM PROGRESS NOTE   SUBJECTIVE (INTERVAL HISTORY) Patient is sitting in bedside chair and smiling..  . She did  pass swallow eval yesterday  Blood pressure has been adequately controlled.EEG shows diffus e slowing and no seizures   OBJECTIVE Temp:  [97.9 F (36.6 C)-99.8 F (37.7 C)] 98.5 F (36.9 C) (06/02 1149) Pulse Rate:  [72-95] 72 (06/02 0700) Cardiac Rhythm:  [-] Normal sinus rhythm (06/02 0400) Resp:  [11-23] 16 (06/02 0700) BP: (103-157)/(51-82) 103/51 mmHg (06/02 0700) SpO2:  [95 %-100 %] 95 % (06/02 0700)  CBC:   Recent Labs Lab 12/16/15 0355 12/17/15 2145  WBC 7.1 16.4*  NEUTROABS 2.6  --   HGB 15.0 13.1  HCT 44.6 40.2  MCV 81.7 83.1  PLT 268 99991111    Basic Metabolic Panel:   Recent Labs Lab 12/17/15 0509 12/17/15 2145  NA 137 139  K 3.7 3.7  CL 106 112*  CO2 22 21*  GLUCOSE 158* 152*  BUN 15 17  CREATININE 0.77 0.81  CALCIUM 9.2 8.3*    Lipid Panel:     Component Value Date/Time   CHOL 263* 05/10/2015 0830   TRIG 83 05/10/2015 0830   HDL 76 05/10/2015 0830   CHOLHDL 3.5 05/10/2015 0830   VLDL 17 05/10/2015 0830   LDLCALC 170* 05/10/2015 0830   HgbA1c:  Lab Results  Component Value Date   HGBA1C 5.7* 12/17/2015   Urine Drug Screen: No results found for: LABOPIA, COCAINSCRNUR, LABBENZ, AMPHETMU, THCU, LABBARB   IMAGING  No results found.  PHYSICAL EXAM [name] drowsy, but responding to commands, some disorientation. Not in distress. . Afebrile. Head is nontraumatic. Cardiac exam no murmur or gallop. Lungs are clear to auscultation .  Neurological Exam ;  Awake and interactive.normal speech and follows commands well.. Eyes movements are full range without nystagmus.Fundi not visualized. Visual fields appear full.l, Answered simple questions. Hearing appears normal. Face symmetric. Moving all extremities. Normal strength, tone, reflexes and coordination. . Normal sensation. Gait  deferred.  ASSESSMENT/PLAN Ms. XIANNA LUPE is a 60 y.o. female with history of HTN, HLD and TIA presenting with new onset headache with nausea and mild vertigo. CT showed acute IVH.  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  Resultant  No Deficits  CT head  Hemorrhage in basilar cisterns, third and fourth, and lateral ventriclar hemorrhage. mass effect on the brainstem. 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 Improved hydrocephalus. IVH stable to slightly improved. No large infarct.  Carotid Doppler - Mild intimal thickening with mild heterogenenous plaque in bilateral internal carotid arteries without significant stenosis noted 1-39%  2D Echo  not ordered  LDL 170 mg    HgbA1c 5.7  SCDs for VTE prophylaxis DIET DYS 2 Room service appropriate?: Yes; Fluid consistency:: Thin, ST assessing. Recommend increase to Dys 2 chopped meats w/ thin liquids   aspirin 325 mg daily prior to admission  Ongoing aggressive stroke risk factor management  Therapy recommendations:  Pending, order PT eval when appropriate.  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  Cont Keppra for now  EEG ordered  Cont Nimodipine  Check transcranial Doppler study for vasospasm yesterday and today  Hypertensive Emergency  BP 171/78 on arrival to ED in setting of neurologic symptoms  SBP goal < 180   Home meds: triamterene-HCTZ 37.5/25, resume when taking PO  Hyperglycemia  Gluc 155  HgbA1c 5.7  Hyperlipidemia  Home meds:  No statin  LDL 170  Hold statin d/t ICH  Other  Stroke Risk Factors  Obesity, Body mass index is 38.76 kg/(m^2).   Hx stroke/TIA  11/2001 - R brain TIA  Migraines  Other Active Problems  Chronic muscle cramps and myalgia on gabapentin  Hypokalemia, resolved  Leukocytosis- likely post [facility] day # 4   I have personally examined this patient, reviewed notes, independently viewed imaging studies, participated in medical decision making and plan of care. I have made any additions or clarifications directly to the above note.  Continue observation in ICU next few days  She likely will need to transfer to inpatient rehabilitation after the weekend.. Discussed with patient, husband and Dr Kathyrn Sheriff, and answered questions.  This patient is critically ill and at significant risk of neurological worsening, death and care requires constant monitoring of vital signs, hemodynamics,respiratory and cardiac monitoring, extensive review of multiple databases, frequent neurological assessment, discussion with family, other specialists and medical decision making of high complexity.I have made any additions or clarifications directly to the above note.This critical care time does not reflect procedure time, or teaching time or supervisory time of PA/NP/Med Resident etc but could involve care discussion time.  I spent 30 minutes of neurocritical care time  in the care of  this patient.     Antony Contras, MD Medical Director [facility] Pager: [phone] 12/20/2015 1:20 PM

## 2015-12-20 NOTE — Progress Notes (Signed)
I will follow up once therapy evaluations are complete to assist with planning rehab venue dispo. [id number]

## 2015-12-20 NOTE — Progress Notes (Signed)
Transcranial Doppler  Date POD PCO2 HCT BP  MCA ACA PCA OPHT SIPH VERT Basilar  12/18/15 MS     Right  Left   25  54   -24  -44   30  14   16  15    *  *   -37  -21   *      12/20/15 MS     Right  Left   52  44   *  -25   53  *   17  21   -22  *   -24  -20     -26         Right  Left                                             Right  Left                                             Right  Left                                            Right  Left                                            Right  Left                                        MCA = Middle Cerebral Artery      OPHT = Opthalmic Artery     BASILAR = Basilar Artery   ACA = Anterior Cerebral Artery     SIPH = Carotid Siphon PCA = Posterior Cerebral Artery   VERT = Verterbral Artery                   Normal MCA = 62+\-12 ACA = 50+\-12 PCA = 42+\-23   *Unable to insonate.  12/20/2015 5:00 PM Maudry Mayhew, RVT, RDCS, RDMS

## 2015-12-20 NOTE — Progress Notes (Signed)
Speech Language Pathology Treatment: Dysphagia;Cognitive-Linquistic  Patient Details Name: Kelly Wilson MRN: [id number] DOB: 1955/10/20 Today's Date: 12/20/2015 Time: 1535-1600 SLP Time Calculation (min) (ACUTE ONLY): 25 min  Assessment / Plan / Recommendation Clinical Impression  Pt with increased alertness and awareness today, joking appropriately. Recalled reason for admission without cues. Demonstrated problem solving during functional activity (eating) with mod verbal cues. Significantly decreased verbal perseveration. She requires continued intervention toward cognitive independence. Decreased rotary mastication pattern with solid however functional without residue. No indications aspiration with straw sips. Upgraded to Dys 3 and continue thin, straw.   HPI HPI: 60 y.o. female history of hypertension, hyperlipidemia, TIA, GERD, spinal stenosis presenting with new onset headache with nausea and mild vertigo. CT scan showed acute hemorrhage involving basilar cisterns, third and fourth ventricles and extending into lateral ventricles. Mass effect on brainstem was noted. Pt underwent craniotomy clipping for intracranial aneurysm. CXR Low lung volumes with mild bibasilar atelectasis.      SLP Plan  Continue with current plan of care     Recommendations  Diet recommendations: Dysphagia 3 (mechanical soft);Thin liquid Liquids provided via: Straw;Cup Medication Administration: Whole meds with puree Supervision: Patient able to self feed;Full supervision/cueing for compensatory strategies Compensations: Minimize environmental distractions;Slow rate;Small sips/bites;Lingual sweep for clearance of pocketing Postural Changes and/or Swallow Maneuvers: Seated upright 90 degrees             General recommendations: Rehab consult Oral Care Recommendations: Oral care BID Follow up Recommendations: Inpatient Rehab Plan: Continue with current plan of care     GO                [name] 12/20/2015, 4:01 PM  [name].Ed [employer] [phone]

## 2015-12-20 NOTE — Progress Notes (Signed)
Occupational Therapy Evaluation Patient Details Name: Kelly Wilson MRN: [id number] DOB: 20-Nov-1955 Today's Date: 12/20/2015    History of Present Illness 60 y.o. female admitted to [facility] on 12/16/15 for acute onset HA, vertigo and nausea.  CT scan revealed acute SAH involving basilar cisterns, third and fourth ventricles and extending into lateral ventricles.  Mass effect on brainstem was noted.  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  Pt with significant PMHx of HTN, spinal stenosis, TIA, L ear hearing loss, ORIF elbow fx, sink graft arm and leg, ankle and knee surgery.       Clinical Impression   PTA, pt independent with ADL and mobility and worked part time as a Solicitor. Currently pt requires +2 Mod A for mobility and max A with ADL due to deficits listed below. Pt is an excellent CIR candidate. Will follow acutely to maximize functional level of independence and facilitate transfer to next venue. Pt states "I want to be able to do things for myself again".     Follow Up Recommendations  CIR;Supervision/Assistance - 24 hour    Equipment Recommendations  3 in 1 bedside comode    Recommendations for Other Services Rehab consult     Precautions / Restrictions Precautions Precautions: Fall Precaution Comments: pitches anteriorly on eval in standing.       Mobility Bed Mobility Overal bed mobility: Needs Assistance;+2 for physical assistance Bed Mobility: Supine to Sit;Sit to Supine     Supine to sit: Mod assist;HOB elevated Sit to supine: +2 for physical assistance;Mod assist   General bed mobility comments: Mod assist to get to sitting with HOB elevated, assist needed to give pt a hand to pull up and to support trunk during transition.  Assist needed at trunk and to lift legs to get back into bed safely.   Transfers Overall transfer level: Needs assistance Equipment used: None Transfers: Sit to/from Stand Sit to Stand: +2  physical assistance;Mod assist         General transfer comment: Two person mod assist to get to standing safely due to initial anterior pitch at trunk.  Pt was able to correct a bit with physical/manual and verbal cues.      Balance Overall balance assessment: Needs assistance Sitting-balance support: Feet supported;Bilateral upper extremity supported Sitting balance-Leahy Scale: Poor Sitting balance - Comments: appears affected by attention Postural control: Other (comment) (in standing anterior lean) Standing balance support: Single extremity supported Standing balance-Leahy Scale: Poor Standing balance comment: two person mod assist to stand. Would not be able to stand unsupported.                             ADL Overall ADL's : Needs assistance/impaired Eating/Feeding: Set up;Supervision/ safety;Sitting   Grooming: Minimal assistance;Sitting   Upper Body Bathing: Moderate assistance;Sitting   Lower Body Bathing: Maximal assistance;Sit to/from stand   Upper Body Dressing : Moderate assistance;Sitting   Lower Body Dressing: Maximal assistance;Sit to/from stand               Functional mobility during ADLs: +2 for physical assistance;Moderate assistance;Cueing for safety;Cueing for sequencing       Vision Vision Assessment?: Vision impaired- to be further tested in functional context Additional Comments: apparent deficits with  visual attention   Perception Perception Perception Tested?: Yes Comments: inattention. will further assess   Praxis Praxis Praxis tested?: Deficits Deficits: Perseveration;Motor Impersistence    Pertinent Vitals/Pain Pain  Assessment: 0-10 Pain Score: 2  Pain Location: head Pain Descriptors / Indicators: Aching Pain Intervention(s): Monitored during session     Hand Dominance Right   Extremity/Trunk Assessment Upper Extremity Assessment Upper Extremity Assessment: LUE deficits/detail LUE Deficits / Details:  spontaneous movement LUE but general weakness. Difficulty using to open containers, etc wtih L hand. Using left hand to assist with opening containers, then appears to just leave L hand at bowl until cued to continue using L hand. appears to have a sensoimotor defiicit LUE Coordination: decreased fine motor;decreased gross motor   Lower Extremity Assessment Lower Extremity Assessment: Generalized weakness   Cervical / Trunk Assessment Cervical / Trunk Assessment: Other exceptions Cervical / Trunk Exceptions: posterior cervical/occipital incision (Difficulty maintaining midline postural control. Anterior)   Communication Communication Communication:  (slow processing and mild perseveration)   Cognition Arousal/Alertness: Awake/alert Behavior During Therapy: Flat affect Overall Cognitive Status: Impaired/Different from baseline Area of Impairment: Attention;Memory;Following commands;Safety/judgement;Awareness;Problem solving Orientation Level: Time Current Attention Level: Sustained Memory: Decreased short-term memory Following Commands: Follows one step commands with increased time Safety/Judgement: Decreased awareness of safety;Decreased awareness of deficits Awareness: Emergent Problem Solving: Slow processing;Requires verbal cues;Requires tactile cues;Decreased initiation General Comments: Pt slow to process, perseverates at times, very unaware of her deficits reporting HA every time when asked "what is difficult?"   General Comments       Exercises       Shoulder Instructions      Home Living Family/patient expects to be discharged to:: Private residence Living Arrangements: Spouse/significant other (husband works partime) Available Help at Discharge: Family;Available PRN/intermittently Type of Home: House Home Access: Stairs to enter [employer] of Steps: 2 Entrance Stairs-Rails: Right Home Layout: One level     Bathroom Shower/Tub: Medical illustrator: Handicapped height Bathroom Accessibility: Yes How Accessible: Accessible via walker Home Equipment: Shower seat - built in      Lives With: Spouse    Prior Functioning/Environment Level of Independence: Independent        Comments: drives, 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 [employer]    OT Diagnosis: Generalized weakness;Cognitive deficits;Disturbance of vision;Acute pain   OT Problem List: Decreased strength;Decreased activity tolerance;Impaired balance (sitting and/or standing);Impaired vision/perception;Decreased coordination;Decreased cognition;Decreased safety awareness;Decreased knowledge of use of DME or AE;Obesity;Impaired UE functional use;Pain;Increased edema   OT Treatment/Interventions: Self-care/ADL training;Therapeutic exercise;Neuromuscular education;Energy conservation;DME and/or AE instruction;Therapeutic activities;Cognitive remediation/compensation;Visual/perceptual remediation/compensation;Patient/family education;Balance training    OT Goals(Current goals can be found in the care plan section) Acute Rehab OT Goals Patient Stated Goal: to go home OT Goal Formulation: With patient Time For Goal Achievement: 01/03/16 Potential to Achieve Goals: Good ADL Goals Pt Will Perform Eating: with modified independence Pt Will Perform Grooming: with set-up;sitting Pt Will Perform Upper Body Bathing: with set-up;sitting Pt Will Perform Lower Body Bathing: with min assist;sit to/from stand Pt Will Transfer to Toilet: with min assist;bedside commode;stand pivot transfer Additional ADL Goal #1: Demonstrate anticipatory awareness in minimally distracting environment with min vc during ADL task  OT Frequency: Min 3X/week   Barriers to D/C:            Co-evaluation PT/OT/SLP Co-Evaluation/Treatment: Yes Reason for Co-Treatment: Complexity of the patient's impairments (multi-system involvement);Necessary to address cognition/behavior during  functional activity;For patient/therapist safety PT goals addressed during session: Mobility/safety with mobility;Balance;Strengthening/ROM OT goals addressed during session: ADL's and self-care      End of Session Equipment Utilized During Treatment: Gait belt Nurse Communication: Mobility status  Activity Tolerance: Patient  tolerated treatment well Patient left: in bed;with call bell/phone within reach;with bed alarm set   Time: 1550-1619 OT Time Calculation (min): 29 min Charges:  OT General Charges $OT Visit: 1 Procedure OT Evaluation $OT Eval Moderate Complexity: 1 Procedure G-Codes:    ,HILLARY 2016-01-05, 5:35 PM   [facility], OTR/L  [phone] 01/05/2016

## 2015-12-20 NOTE — Evaluation (Signed)
Physical Therapy Evaluation Patient Details Name: Kelly Wilson MRN: [id number] DOB: Dec 15, 1955 Today's Date: 12/20/2015   History of Present Illness  60 y.o. female admitted to [facility] on 12/16/15 for acute onset HA, vertigo and nausea.  CT scan revealed acute SAH involving basilar cisterns, third and fourth ventricles and extending into lateral ventricles.  Mass effect on brainstem was noted.  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  Pt with significant PMHx of HTN, spinal stenosis, TIA, L ear hearing loss, ORIF elbow fx, sink graft arm and leg, ankle and knee surgery.      Clinical Impression  Pt was able to stand at EOB and take a few side steps with two person assist for safety. Significant decrease awareness of deficits.  Anterior trunk in standing.  Pt would benefit from CIR level therapies at discharge.  She was completely independent PTA and her husband works still.   PT to follow acutely for deficits listed below.       Follow Up Recommendations CIR    Equipment Recommendations  Rolling walker with 5" wheels;3in1 (PT);Wheelchair (measurements PT);Wheelchair cushion (measurements PT)    Recommendations for Other Services Rehab consult     Precautions / Restrictions Precautions Precautions: Fall Precaution Comments: pitches anteriorly on eval in standing.       Mobility  Bed Mobility Overal bed mobility: Needs Assistance;+2 for physical assistance Bed Mobility: Supine to Sit;Sit to Supine     Supine to sit: Mod assist;HOB elevated Sit to supine: +2 for physical assistance;Mod assist   General bed mobility comments: Mod assist to get to sitting with HOB elevated, assist needed to give pt a hand to pull up and to support trunk during transition.  Assist needed at trunk and to lift legs to get back into bed safely.   Transfers Overall transfer level: Needs assistance Equipment used: None Transfers: Sit to/from Stand Sit to Stand: +2  physical assistance;Mod assist         General transfer comment: Two person mod assist to get to standing safely due to initial anterior pitch at trunk.  Pt was able to correct a bit with physical/manual and verbal cues.    Ambulation/Gait Ambulation/Gait assistance: +2 physical assistance;Mod assist Ambulation Distance (Feet): 3 Feet Assistive device: 2 person hand held assist Gait Pattern/deviations: Step-to pattern (side steppin)     General Gait Details: Pt with two person hand held assist to step up sideways in bed.  Bil legs in contact with bed with side stepping and significant extra time needed.  Difficult to assess with only a few side steps, but seems very uncoordinated despite heel to shin testing being WNL preformed supine in bed.          Balance Overall balance assessment: Needs assistance Sitting-balance support: Feet supported;Bilateral upper extremity supported Sitting balance-Leahy Scale: Poor Sitting balance - Comments: Pt needed min assist at times in sitting and stayed proped on bil arms throughout.  Postural control: Other (comment) (in standing anterior lean) Standing balance support: Single extremity supported Standing balance-Leahy Scale: Poor Standing balance comment: two person mod assist to stand. Would not be able to stand unsupported.                              Pertinent Vitals/Pain Pain Assessment: 0-10 Pain Score: 2  Pain Location: head Pain Descriptors / Indicators: Aching Pain Intervention(s): Monitored during session    Home Living Family/patient  expects to be discharged to:: Private residence Living Arrangements: Spouse/significant other (husband works partime) Available Help at Discharge: Family;Available PRN/intermittently Type of Home: House Home Access: Stairs to enter Entrance Stairs-Rails: Right Entrance Stairs-Number of Steps: 2 Home Layout: One level Home Equipment: Shower seat - built in      Prior Function  Level of Independence: Independent         Comments: drives, 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 [employer]   Dominant Hand: Right    Extremity/Trunk Assessment   Upper Extremity Assessment: Defer to OT evaluation           Lower Extremity Assessment: Generalized weakness (funtional weakness, in bed MMT 5/5)      Cervical / Trunk Assessment: Other exceptions  Communication   Communication:  (slow processing and mild perseveration)  Cognition Arousal/Alertness: Awake/alert Behavior During Therapy: Flat affect Overall Cognitive Status: Impaired/Different from baseline Area of Impairment: Orientation;Attention;Memory;Following commands;Safety/judgement;Awareness;Problem solving Orientation Level: Time Current Attention Level: Sustained Memory: Decreased short-term memory Following Commands: Follows one step commands with increased time Safety/Judgement: Decreased awareness of safety;Decreased awareness of deficits Awareness: Intellectual Problem Solving: Slow processing;Requires verbal cues;Requires tactile cues General Comments: Pt slow to process, perseverates at times, very unaware of her deficits reporting HA every time when asked "what is difficult?"             Assessment/Plan    PT Assessment Patient needs continued PT services  PT Diagnosis Difficulty walking;Abnormality of gait;Generalized weakness;Acute pain;Altered mental status   PT Problem List Decreased strength;Decreased activity tolerance;Decreased balance;Decreased mobility;Decreased knowledge of use of DME;Decreased cognition;Obesity;Pain;Decreased safety awareness;Decreased knowledge of precautions  PT Treatment Interventions DME instruction;Gait training;Stair training;Functional mobility training;Therapeutic activities;Therapeutic exercise;Balance training;Neuromuscular re-education;Cognitive remediation;Patient/family education   PT Goals (Current goals can be found in the  Care Plan section) Acute Rehab PT Goals Patient Stated Goal: to decrease HA PT Goal Formulation: With patient Time For Goal Achievement: 01/03/16 Potential to Achieve Goals: Good    Frequency Min 4X/week   Barriers to discharge Decreased caregiver support sounds like husband works some.     Co-evaluation PT/OT/SLP Co-Evaluation/Treatment: Yes Reason for Co-Treatment: Necessary to address cognition/behavior during functional activity;For patient/therapist safety;Complexity of the patient's impairments (multi-system involvement) PT goals addressed during session: Mobility/safety with mobility;Balance;Strengthening/ROM         End of Session Equipment Utilized During Treatment: Gait belt Activity Tolerance: Patient limited by pain;Patient limited by fatigue Patient left: in bed;with call bell/phone within reach;with bed alarm set Nurse Communication: Mobility status         Time: [id number] PT Time Calculation (min) (ACUTE ONLY): 27 min   Charges:   PT Evaluation $PT Eval Moderate Complexity: 1 Procedure           B. [place], [place], DPT [phone]   12/20/2015, 4:38 PM

## 2015-12-21 NOTE — Progress Notes (Signed)
STROKE TEAM PROGRESS NOTE   SUBJECTIVE (INTERVAL HISTORY) Patient is sitting in bed  and interactive.  .    Blood pressure has been adequately controlled.TCD shows no vasospasm  But suboptimal study. OBJECTIVE Temp:  [97.6 F (36.4 C)-98.4 F (36.9 C)] 98.4 F (36.9 C) (06/03 1200) Pulse Rate:  [62-93] 68 (06/03 1300) Cardiac Rhythm:  [-] Normal sinus rhythm (06/03 0748) Resp:  [13-23] 14 (06/03 1300) BP: (109-168)/(41-82) 109/41 mmHg (06/03 1300) SpO2:  [94 %-100 %] 98 % (06/03 1300)  CBC:   Recent Labs Lab 12/16/15 0355 12/17/15 2145  WBC 7.1 16.4*  NEUTROABS 2.6  --   HGB 15.0 13.1  HCT 44.6 40.2  MCV 81.7 83.1  PLT 268 [id number]    Basic Metabolic Panel:   Recent Labs Lab 12/17/15 0509 12/17/15 2145  NA 137 139  K 3.7 3.7  CL 106 112*  CO2 22 21*  GLUCOSE 158* 152*  BUN 15 17  CREATININE 0.77 0.81  CALCIUM 9.2 8.3*    Lipid Panel:     Component Value Date/Time   CHOL 263* 05/10/2015 0830   TRIG 83 05/10/2015 0830   HDL 76 05/10/2015 0830   CHOLHDL 3.5 05/10/2015 0830   VLDL 17 05/10/2015 0830   LDLCALC 170* 05/10/2015 0830   HgbA1c:  Lab Results  Component Value Date   HGBA1C 5.7* 12/17/2015   Urine Drug Screen: No results found for: LABOPIA, COCAINSCRNUR, LABBENZ, AMPHETMU, THCU, LABBARB   IMAGING  No results found.  PHYSICAL EXAM [name] drowsy, but responding to commands, some disorientation. Not in distress. . Afebrile. Head is nontraumatic. Cardiac exam no murmur or gallop. Lungs are clear to auscultation .  Neurological Exam ;  Awake and interactive.normal speech and follows commands well.. Eyes movements are full range without nystagmus.Fundi not visualized. Visual fields appear full.l, Answered simple questions. Hearing appears normal. Face symmetric. Moving all extremities. Normal strength, tone, reflexes and coordination. . Normal sensation. Gait deferred.  ASSESSMENT/PLAN Ms. NAEEMA WYCHE is a 60 y.o.  female with history of HTN, HLD and TIA presenting with new onset headache with nausea and mild vertigo. CT showed acute IVH.  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  Resultant  No Deficits  CT head  Hemorrhage in basilar cisterns, third and fourth, and lateral ventriclar hemorrhage. mass effect on the brainstem. 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 Improved hydrocephalus. IVH stable to slightly improved. No large infarct.  Carotid Doppler - Mild intimal thickening with mild heterogenenous plaque in bilateral internal carotid arteries without significant stenosis noted 1-39%  2D Echo  not ordered  LDL 170 mg    HgbA1c 5.7  SCDs for VTE prophylaxis DIET DYS 3 Room service appropriate?: Yes; Fluid consistency:: Thin, ST assessing. Recommend increase to Dys 2 chopped meats w/ thin liquids   aspirin 325 mg daily prior to admission  Ongoing aggressive stroke risk factor management  Therapy recommendations:  CLR  Disposition:  CLR  R PICA Aneurysm  Post Surgical Clipping 12/17/15  Stable post-op Ct with some air intracranial cavity.  Cont Keppra for now  EEG no seizures.    Cont Nimodipine  Check transcranial Doppler study for vasospasm yesterday  and today  Hypertensive Emergency  BP 171/78 on arrival to ED in setting of neurologic symptoms  SBP goal < 180   Home meds: triamterene-HCTZ 37.5/25, resume when taking PO  Hyperglycemia  Gluc 155  HgbA1c 5.7  Hyperlipidemia  Home meds:  No statin  LDL 170  Hold statin d/t ICH  Other Stroke Risk Factors  Obesity, Body mass index is 38.76 kg/(m^2).   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 I have made any additions or clarifications directly to the above note.  Continue observation in ICU next few days  She likely will need to transfer to inpatient rehabilitation after the weekend.. Discussed with patient, husband and Dr Ellene Route, and answered questions.  This patient is critically ill and at significant risk of neurological worsening, death and care requires constant monitoring of vital signs, hemodynamics,respiratory and cardiac monitoring, extensive review of multiple databases, frequent neurological assessment, discussion with family, other specialists and medical decision making of high complexity.I have made any additions or clarifications directly to the above note.This critical care time does not reflect procedure time, or teaching time or supervisory time of PA/NP/Med Resident etc but could involve care discussion time.  I spent 30 minutes of neurocritical care time  in the care of  this patient.     Antony Contras, MD Medical Director [facility] Pager: [phone] 12/21/2015 1:27 PM

## 2015-12-21 NOTE — Progress Notes (Signed)
Patient ID: Kelly Wilson, female   DOB: Feb 02, 1956, 60 y.o.   MRN: [id number] Vital signs are stable Motor function is good in upper and lower extremities Patient notes that she feels jittery and somewhat anxious She has been taken some diet Continues to mobilize Will maintain in ICU over weekend

## 2015-12-22 MED ORDER — GABAPENTIN 300 MG PO CAPS
900.0000 mg | ORAL_CAPSULE | Freq: Three times a day (TID) | ORAL | Status: DC
  Administered 2015-12-22 – 2015-12-27 (×14): 900 mg via ORAL
  Filled 2015-12-22 (×16): qty 3

## 2015-12-22 MED ORDER — ENOXAPARIN SODIUM 40 MG/0.4ML ~~LOC~~ SOLN
40.0000 mg | SUBCUTANEOUS | Status: DC
  Administered 2015-12-22 – 2015-12-27 (×6): 40 mg via SUBCUTANEOUS
  Filled 2015-12-22 (×6): qty 0.4

## 2015-12-22 MED ORDER — GABAPENTIN 250 MG/5ML PO SOLN
900.0000 mg | Freq: Three times a day (TID) | ORAL | Status: DC
  Filled 2015-12-22 (×2): qty 20

## 2015-12-22 NOTE — Progress Notes (Signed)
Received from Neuro ICU via wheelchair; patient is alert and oriented; oriented patient and her family member to the room and unit routine.

## 2015-12-22 NOTE — Progress Notes (Signed)
Patient ID: Kelly Wilson, female   DOB: 02-27-1956, 60 y.o.   MRN: [id number] Vital signs are stable Neurologically patient is intact A bit unsteady on her feet but moving about quite well Ready for rehabilitation tomorrow Patient can be transferred to the floor if that is necessary in ICU

## 2015-12-22 NOTE — Progress Notes (Signed)
STROKE TEAM PROGRESS NOTE   SUBJECTIVE (INTERVAL HISTORY) Patient is sitting in bed  and feeling good.No complaints  .    Blood pressure has been adequately controlled.  OBJECTIVE Temp:  [97.3 F (36.3 C)-98.5 F (36.9 C)] 97.8 F (36.6 C) (06/04 1200) Pulse Rate:  [58-85] 69 (06/04 1000) Cardiac Rhythm:  [-] Normal sinus rhythm (06/04 0800) Resp:  [12-20] 14 (06/04 1000) BP: (105-167)/(39-81) 144/68 mmHg (06/04 1000) SpO2:  [92 %-100 %] 99 % (06/04 1000)  CBC:   Recent Labs Lab 12/16/15 0355 12/17/15 2145  WBC 7.1 16.4*  NEUTROABS 2.6  --   HGB 15.0 13.1  HCT 44.6 40.2  MCV 81.7 83.1  PLT 268 99991111    Basic Metabolic Panel:   Recent Labs Lab 12/17/15 0509 12/17/15 2145  NA 137 139  K 3.7 3.7  CL 106 112*  CO2 22 21*  GLUCOSE 158* 152*  BUN 15 17  CREATININE 0.77 0.81  CALCIUM 9.2 8.3*    Lipid Panel:     Component Value Date/Time   CHOL 263* 05/10/2015 0830   TRIG 83 05/10/2015 0830   HDL 76 05/10/2015 0830   CHOLHDL 3.5 05/10/2015 0830   VLDL 17 05/10/2015 0830   LDLCALC 170* 05/10/2015 0830   HgbA1c:  Lab Results  Component Value Date   HGBA1C 5.7* 12/17/2015   Urine Drug Screen: No results found for: LABOPIA, COCAINSCRNUR, LABBENZ, AMPHETMU, THCU, LABBARB   IMAGING  No results found.  PHYSICAL EXAM [name] drowsy, but responding to commands, some disorientation. Not in distress. . Afebrile. Head is nontraumatic. Cardiac exam no murmur or gallop. Lungs are clear to auscultation .  Neurological Exam ;  Awake and interactive.normal speech and follows commands well.. Eyes movements are full range without nystagmus.Fundi not visualized. Visual fields appear full.l, Answered simple questions. Hearing appears normal. Face symmetric. Moving all extremities. Normal strength, tone, reflexes and coordination. . Normal sensation. Gait deferred.  ASSESSMENT/PLAN [name] is a 60 y.o. female with history of HTN, HLD  and TIA presenting with new onset headache with nausea and mild vertigo. CT showed acute IVH.  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  Resultant  No Deficits  CT head  Hemorrhage in basilar cisterns, third and fourth, and lateral ventriclar hemorrhage. mass effect on the brainstem. 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 Improved hydrocephalus. IVH stable to slightly improved. No large infarct.  Carotid Doppler - Mild intimal thickening with mild heterogenenous plaque in bilateral internal carotid arteries without significant stenosis noted 1-39%  2D Echo  not ordered  LDL 170 mg    HgbA1c 5.7  SCDs for VTE prophylaxis DIET DYS 3 Room service appropriate?: Yes; Fluid consistency:: Thin, ST assessing. Recommend increase to Dys 2 chopped meats w/ thin liquids   aspirin 325 mg daily prior to admission  Ongoing aggressive stroke risk factor management  Therapy recommendations:  CLR  Disposition:  CLR  R PICA Aneurysm  Post Surgical Clipping 12/17/15  Stable post-op Ct with some air intracranial cavity.  Cont Keppra for now  EEG no seizures.    Cont Nimodipine  Check transcranial Doppler study for vasospasm yesterday and today  Hypertensive  Emergency  BP 171/78 on arrival to ED in setting of neurologic symptoms  SBP goal < 180   Home meds: triamterene-HCTZ 37.5/25, resume when taking PO  Hyperglycemia  Gluc 155  HgbA1c 5.7  Hyperlipidemia  Home meds:  No statin  LDL 170  Hold statin d/t ICH  Other Stroke Risk Factors  Obesity, Body mass index is 38.76 kg/(m^2).   Hx stroke/TIA  11/2001 - R brain TIA  Migraines  Other Active  Problems  Chronic muscle cramps and myalgia on gabapentin  Hypokalemia, resolved  Leukocytosis- likely post [facility] day # 6   I have personally examined this patient, reviewed notes, independently viewed imaging studies, participated in medical decision making and plan of care. I have made any additions or clarifications directly to the above note.   Plan to transfer to neurology floor bed today. We'll transfer to rehabilitation in the next few days Discussed with patient, husband and Dr Ellene Route, and answered questions. Greater than 50% of time during this 25 minute visit was spent on counseling and coordination of care about her aneurysm and brain hemorrhage     Antony Contras, MD Medical Director [facility] Pager: [phone] 12/22/2015 12:24 PM

## 2015-12-23 ENCOUNTER — Inpatient Hospital Stay (HOSPITAL_COMMUNITY): Payer: BLUE CROSS/BLUE SHIELD

## 2015-12-23 DIAGNOSIS — E785 Hyperlipidemia, unspecified: Secondary | ICD-10-CM

## 2015-12-23 DIAGNOSIS — I609 Nontraumatic subarachnoid hemorrhage, unspecified: Secondary | ICD-10-CM

## 2015-12-23 DIAGNOSIS — I10 Essential (primary) hypertension: Secondary | ICD-10-CM

## 2015-12-23 LAB — URINALYSIS, ROUTINE W REFLEX MICROSCOPIC
Bilirubin Urine: NEGATIVE
Glucose, UA: NEGATIVE mg/dL
Ketones, ur: NEGATIVE mg/dL
NITRITE: NEGATIVE
PH: 8 (ref 5.0–8.0)
Protein, ur: NEGATIVE mg/dL
SPECIFIC GRAVITY, URINE: 1.016 (ref 1.005–1.030)

## 2015-12-23 LAB — BASIC METABOLIC PANEL
ANION GAP: 7 (ref 5–15)
BUN: 14 mg/dL (ref 6–20)
CO2: 23 mmol/L (ref 22–32)
Calcium: 9.1 mg/dL (ref 8.9–10.3)
Chloride: 107 mmol/L (ref 101–111)
Creatinine, Ser: 0.68 mg/dL (ref 0.44–1.00)
GFR calc Af Amer: >60 mL/min (ref >60)
GLUCOSE: 110 mg/dL — AB (ref 65–99)
POTASSIUM: 4.6 mmol/L (ref 3.5–5.1)
SODIUM: 137 mmol/L (ref 135–145)

## 2015-12-23 LAB — URINE MICROSCOPIC-ADD ON

## 2015-12-23 LAB — CBC
HEMATOCRIT: 44.2 % (ref 36.0–46.0)
HEMOGLOBIN: 15.2 g/dL — AB (ref 12.0–15.0)
MCH: 27.2 pg (ref 26.0–34.0)
MCHC: 34.4 g/dL (ref 30.0–36.0)
MCV: 79.2 fL (ref 78.0–100.0)
Platelets: 278 10*3/uL (ref 150–400)
RBC: 5.58 MIL/uL — AB (ref 3.87–5.11)
RDW: 13.5 % (ref 11.5–15.5)
WBC: 9.1 10*3/uL (ref 4.0–10.5)

## 2015-12-23 NOTE — Progress Notes (Signed)
Noted medical decline this morning. I will follow. [id number]

## 2015-12-23 NOTE — Progress Notes (Signed)
OT Cancellation Note  Patient Details Name: Kelly Wilson MRN: [id number] DOB: 01/30/1956   Cancelled Treatment:    Reason Eval/Treat Not Completed: Medical issues which prohibited therapy. Pt transferred to ICU. Will follow.  [name] 12/23/2015, 10:38 AM

## 2015-12-23 NOTE — Progress Notes (Addendum)
Patient answered questions at bedside;patient denies chest pain; denies shortness of breath; denies pain;Moving extremities to commands; family member at bedside; patient becoming increasingly lethargic; notified neurology; orders received. By 07:45 patient obtunded; some response to sternal rub; vital signs stable; rapid response called; neurosurgery contacted and neurology repaged. Orders received and carried out. Patient sent to stat CT scan; continue to monitor; await ICU transfer.

## 2015-12-23 NOTE — Progress Notes (Signed)
Transcranial Doppler  Date POD PCO2 HCT BP  MCA ACA PCA OPHT SIPH VERT Basilar  12/18/15 MS     Right  Left   25  54   -24  -44   30  14   16  15    *  *   -37  -21   *      12/20/15 MS     Right  Left   52  44   *  -25   53  *   17  21   -22  *   -24  -20     -26    12/23/15 MS     Right  Left   49  44   *  -49   60  22   22  19   29   *   -25  -51   *            Right  Left                                             Right  Left                                            Right  Left                                            Right  Left                                        MCA = Middle Cerebral Artery      OPHT = Opthalmic Artery     BASILAR = Basilar Artery   ACA = Anterior Cerebral Artery     SIPH = Carotid Siphon PCA = Posterior Cerebral Artery   VERT = Verterbral Artery                   Normal MCA = 62+\-12 ACA = 50+\-12 PCA = 42+\-23   *Unable to insonate.  12/23/2015 5:11 PM Maudry Mayhew, RVT, RDCS, RDMS

## 2015-12-23 NOTE — Progress Notes (Signed)
Pt seen and examined. Early this am, pt was apparently somnolent, unable to be aroused, even with sternal rub. She was incontinent of urine at least twice. She was transferred to the ICU after CT was done.  EXAM: Temp:  [97.8 F (36.6 C)-98.6 F (37 C)] 98.6 F (37 C) (06/05 0936) Pulse Rate:  [55-73] 73 (06/05 1003) Resp:  [14-20] 14 (06/05 1003) BP: (134-164)/(59-88) 149/77 mmHg (06/05 1003) SpO2:  [97 %-100 %] 99 % (06/05 1003) Intake/Output      06/04 0701 - 06/05 0700 06/05 0701 - 06/06 0700   P.O. 480    I.V. (mL/kg) 500 (4.6)    Total Intake(mL/kg) 980 (9)    Urine (mL/kg/hr) 800 (0.3)    Stool     Total Output 800     Net +180          Urine Occurrence 3 x 1 x   Stool Occurrence 2 x     Awake, alert, oriented to person, hospital, year CN grossly intact Moving all extremities to command, good strength  LABS: Lab Results  Component Value Date   CREATININE 0.68 12/23/2015   BUN 14 12/23/2015   NA 137 12/23/2015   K 4.6 12/23/2015   CL 107 12/23/2015   CO2 23 12/23/2015   Lab Results  Component Value Date   WBC 9.1 12/23/2015   HGB 15.2* 12/23/2015   HCT 44.2 12/23/2015   MCV 79.2 12/23/2015   PLT 278 12/23/2015    IMAGING: CTH demonstrates ventriculomegaly with increase in size of the 3rd and lateral ventricles in comparison to 5/30 CT.  IMPRESSION: - 60 y.o. female s/p SAH/IVH and clipping of RPICA aneurysm. Does demonstrate ventriculomegaly on CT, however currently she is awake, alert, and at her hospital baseline. Waxing/waning clinical exam would be unusual for hydrocephalus.  PLAN: - Will cont to observe in the ICU for any decline. For now, given her good exam, will hold off on placement of EVD. - Keep NPO for today in case EVD is needed.

## 2015-12-23 NOTE — Evaluation (Signed)
SLP Cancellation Note  Patient Details Name: Kelly Wilson MRN: [id number] DOB: [birth year]   Cancelled treatment:       Reason Eval/Treat Not Completed: Medical issues which prohibited therapy   Luanna Salk, [facility] SLP [phone]

## 2015-12-23 NOTE — Progress Notes (Signed)
Patient is more disoriented this morning, disoriented to situation and time. Notified Dr. Nicole Kindred. No new orders at this time. Will continue to monitor.

## 2015-12-23 NOTE — Progress Notes (Signed)
PT Cancellation Note  Patient Details Name: Kelly Wilson MRN: [id number] DOB: [birth year]   Cancelled Treatment:    Reason Eval/Treat Not Completed: Medical issues which prohibited therapy. Pt with altered mental status and unresponsiveness. Pt headed to stat CT. PT to hold today and await results of CT. PT to return as able/as appropriate.   Kingsley Callander 12/23/2015, 8:32 AM   Kittie Plater, PT, DPT Pager #: [phone] Office #: [phone]'

## 2015-12-23 NOTE — Significant Event (Signed)
Rapid Response Event Note  Overview: Time Called: 0808 Arrival Time: 0810 Event Type: Neurologic  Initial Focused Assessment: Per staff patient has been confused and generally weak during the night but able to answer questions and interact with staff.  This am she became obtunded. Upon my arrival patient with snoring respirations and gag reflex but no verbal output. BP 156/79  SR 60s  RR 16 O2 sats 100% on RA Husband at bedside  Interventions: Stat head CT done NSL started in right AC, good blood return good flush Plan ICU transfer  0930 Patient more responsive and able to carry on a dialog and follow commands 0950 Patient transported to 3M04 with heart monitor, husband at bedside. ICU Rn at bedside upon arrival, updated on patient status   Event Summary: Name of Physician Notified: [email or web address] at [phone]  Name of Consulting Physician Notified: Erlinda Hong at [place]: Transferred ([place]) (3m)     Raliegh Ip

## 2015-12-23 NOTE — Progress Notes (Signed)
STROKE TEAM PROGRESS NOTE   SUBJECTIVE (INTERVAL HISTORY) This morning patient confused, incontinent, which is new from her assessment yesterday per RN. Labs drawn this am without significant change. Patient progressed to unresponsive state, not respond to sternal rub. Stat CT shows interval development of hydrocephalus. Patient was transferred to the ICU. She became oriented prior to transfer. NS concerned mental status not related to hydrocephaus. Holding on EVD placement. Pt much more awake alert and conversant in the afternoon.      OBJECTIVE Temp:  [98.1 F (36.7 C)-98.6 F (37 C)] 98.1 F (36.7 C) (06/05 1200) Pulse Rate:  [55-73] 71 (06/05 1500) Cardiac Rhythm:  [-] Normal sinus rhythm (06/05 1000) Resp:  [14-20] 15 (06/05 1500) BP: (138-171)/(56-123) 143/59 mmHg (06/05 1500) SpO2:  [97 %-100 %] 98 % (06/05 1500)  CBC:   Recent Labs Lab 12/17/15 2145 12/23/15 0705  WBC 16.4* 9.1  HGB 13.1 15.2*  HCT 40.2 44.2  MCV 83.1 79.2  PLT 265 0000000    Basic Metabolic Panel:   Recent Labs Lab 12/17/15 2145 12/23/15 0705  NA 139 137  K 3.7 4.6  CL 112* 107  CO2 21* 23  GLUCOSE 152* 110*  BUN 17 14  CREATININE 0.81 0.68  CALCIUM 8.3* 9.1    Lipid Panel:     Component Value Date/Time   CHOL 263* 05/10/2015 0830   TRIG 83 05/10/2015 0830   HDL 76 05/10/2015 0830   CHOLHDL 3.5 05/10/2015 0830   VLDL 17 05/10/2015 0830   LDLCALC 170* 05/10/2015 0830   HgbA1c:  Lab Results  Component Value Date   HGBA1C 5.7* 12/17/2015   Urine Drug Screen: No results found for: LABOPIA, COCAINSCRNUR, LABBENZ, AMPHETMU, THCU, LABBARB   IMAGING I have personally reviewed the radiological images below and agree with the radiology interpretations.  Ct Angio Head W/cm &/or Wo Cm 12/16/2015   Intraventricular hemorrhage is centered in the fourth ventricle and is unchanged from earlier today. Developing hydrocephalus with progressive ventricular dilatation since earlier today 3 x 5 mm  aneurysm distal right PICA felt to be the cause of hemorrhage into the fourth ventricle.   Ct Head Wo Contrast 12/23/2015 Post occipital craniotomy for clipping of aneurysm. Interval development of hydrocephalus. Blood surrounds the operative site. Now noted is blood within the aqueduct with decreased amount blood within the third ventricle and fourth ventricle. Small amount of dependent blood remains within the dilated lateral ventricles.   12/17/2015   1. Postoperative sequela of interval suboccipital craniectomy for surgical clipping of a right PICA territory aneurysm. 2. Persistent but slightly improved hydrocephalus as compared to preoperative exam. Overall intraventricular hemorrhage isw onset headache with nausea and mild vertigo. CT showed acute IVH. stable to slightly improved. Crowding of the basilar cisterns also stable to slightly improved. 3. No definite evolving large vessel territory infarct or other complication.   12/16/2015   Acute hemorrhage in the basilar cisterns, third and fourth ventricle extending into the lateral ventricles. There is mass effect on the brainstem. 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   CUS - Bilateral carotid arteries demonstrated a mild amount of heterogeneous plaque which was not significant 1-39%. Vertebral are patent with antegrade flow.  TTE - pending  EEG - Abnormal routine inpatient EEG suggestive of at least moderate encephalopathy secondary to generalized cerebral dysfunction, which can be seen due to medication/sedation effect, in addition to diffuse intracranial pathology as well as other systemic toxic metabolic  etiology,. No seizures noted. Clinical correlation is recommended .   TCD - no evidence of vasospasm   PHYSICAL EXAM [name] drowsy, but awake to commands, alert and orientated. Not in distress. Afebrile. Head is nontraumatic. Cardiac exam  no murmur or gallop. Lungs are clear to auscultation .  Neurological Exam:  Initially drowsy but on voice, she became awake alert and interactive. normal speech and follows commands well. Eyes movements are full range without nystagmus.Fundi not visualized. Visual fields appear full, Answered simple questions without dysarthria. Hearing appears normal. Face symmetric. Moving all extremities. Normal strength, tone, reflexes and coordination. . Normal sensation. Gait deferred.   ASSESSMENT/PLAN Kelly Wilson is a 60 y.o. 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 mass effect on the brainstem. 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 Improved hydrocephalus. IVH stable to slightly improved. No large infarct.  CT today with interval development of hydrocephalus, blood around operative site and in aqueduct. Some blood in dilated lateral ventricles.  CUS - unremarkable  EEG - no seizure  TCD - no vasospasm  2D Echo pending  LDL 170     HgbA1c 5.7  SCDs for VTE prophylaxis Diet NPO time specified  aspirin 325 mg daily prior to admission  Ongoing aggressive stroke risk factor management  Therapy recommendations:  CIR  Disposition:  CIR  Hydrocephalus  Repeat CT showed interval development of hydrocephalus  Transfer to ICU for close monitoring  Improved mental status  Hold off EVD for now.  NSG on board  R PICA Aneurysm  Post Surgical Clipping 12/17/15  Cont  Keppra for now  EEG no seizures.    Cont Nimodipine  TCD no evidence of vasospasm  Hypertensive Emergency  BP 171/78 on arrival to ED in setting of neurologic symptoms  SBP goal < 160   Home meds: triamterene-HCTZ 37.5/25, resume when taking PO  Hyperlipidemia  Home meds:  No statin  LDL 170  Hold statin d/t IVH  Consider statin on discharge  Other Stroke Risk Factors  Obesity, Body mass index is 38.76 kg/(m^2).   Hx stroke/TIA  11/2001 - R brain TIA  Migraines  Other Active Problems  Chronic muscle cramps and myalgia on gabapentin  Hypokalemia, resolved  Leukocytosis- likely post [facility] day # 7   This patient is critically ill due to IVH, PICA aneurysm s/p clipping, hydrocephalus and at significant risk of neurological worsening, death form rebleeding, seizure, hydrocephalus, cerebral edema and brain herniation. This patient's care requires constant monitoring of vital signs, hemodynamics, respiratory and cardiac monitoring, review of multiple databases, neurological assessment, discussion with family, other specialists and medical decision making of high complexity. I spent 40 minutes of neurocritical care time in the care of this patient.  Rosalin Hawking, MD PhD Stroke Neurology 12/23/2015 6:53 PM

## 2015-12-23 NOTE — Progress Notes (Signed)
Report called to Neuro ICU; rapid response RN remains with patient; ready for transport; patient now alert;oriented to person; not situation; moving all extremities; continue to monitor.

## 2015-12-24 ENCOUNTER — Inpatient Hospital Stay (HOSPITAL_COMMUNITY): Payer: BLUE CROSS/BLUE SHIELD

## 2015-12-24 DIAGNOSIS — I6789 Other cerebrovascular disease: Secondary | ICD-10-CM

## 2015-12-24 LAB — ECHOCARDIOGRAM COMPLETE
HEIGHTINCHES: 66 in
WEIGHTICAEL: 3840 [oz_av]

## 2015-12-24 MED ORDER — VALPROIC ACID 250 MG PO CAPS
500.0000 mg | ORAL_CAPSULE | Freq: Every day | ORAL | Status: DC
  Administered 2015-12-25 – 2015-12-27 (×3): 500 mg via ORAL
  Filled 2015-12-24 (×3): qty 2

## 2015-12-24 MED ORDER — TRIAMTERENE-HCTZ 37.5-25 MG PO TABS
1.0000 | ORAL_TABLET | Freq: Every day | ORAL | Status: DC
  Administered 2015-12-24 – 2015-12-27 (×4): 1 via ORAL
  Filled 2015-12-24 (×4): qty 1

## 2015-12-24 NOTE — Progress Notes (Signed)
Physical Therapy Treatment Patient Details Name: Kelly Wilson MRN: [id number] DOB: [birth year] Today's Date: 12/24/2015    History of Present Illness 60 y.o. female admitted to [facility] on 12/16/15 for acute onset HA, vertigo and nausea.  CT scan revealed acute SAH involving basilar cisterns, third and fourth ventricles and extending into lateral ventricles.  Mass effect on brainstem was noted.  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 12/23/15 transfer to ICU due to decr arousal Pt with significant PMHx of HTN, spinal stenosis, TIA, L ear hearing loss, ORIF elbow fx, sink graft arm and leg, ankle and knee surgery.        PT Comments    Patient progressing with mobility. Performed session related to gait training. Patient requires increased physical assist for ambulation and transfers at this time in addition to multi-modal cues. Patient does appear more engaged and conversational throughout session. Will continue to see and progress as tolerated.   Follow Up Recommendations  CIR     Equipment Recommendations  Rolling walker with 5" wheels;3in1 (PT);Wheelchair (measurements PT);Wheelchair cushion (measurements PT)    Recommendations for Other Services Rehab consult     Precautions / Restrictions Precautions Precautions: Fall Precaution Comments: static standing ~1 minute with sudden need to sit    Mobility  Bed Mobility Overal bed mobility: Needs Assistance Bed Mobility: Supine to Sit     Supine to sit: Min guard     General bed mobility comments: received in chair  Transfers Overall transfer level: Needs assistance Equipment used: Rolling walker (2 wheeled) Transfers: Sit to/from Stand Sit to Stand: Mod assist         General transfer comment: cues for sequence  Ambulation/Gait Ambulation/Gait assistance: Mod assist;+2 physical assistance Ambulation Distance (Feet): 90 Feet (40 ft then 50 ft) Assistive device: Rolling walker (2  wheeled) Gait Pattern/deviations: Step-through pattern;Decreased stride length;Decreased weight shift to right;Decreased weight shift to left;Shuffle;Staggering left;Drifts right/left;Trunk flexed Gait velocity: decreased Gait velocity interpretation: Below normal speed for age/gender General Gait Details: patient with difficulty performing weight shift, required manual faciliatation of weight shift to induce consistent stride, when no external cue for shift provided patient would over shift or under shift and lose balance requiring increased physical assist to stabilize. Staggers L >R   Stairs            Wheelchair Mobility    Modified Rankin (Stroke Patients Only)       Balance Overall balance assessment: Needs assistance Sitting-balance support: Bilateral upper extremity supported;Feet supported Sitting balance-Leahy Scale: Fair     Standing balance support: Bilateral upper extremity supported Standing balance-Leahy Scale: Poor Standing balance comment: required assist and reliance on Rw                    Cognition Arousal/Alertness: Awake/alert Behavior During Therapy: Flat affect Overall Cognitive Status: Impaired/Different from baseline Area of Impairment: Attention;Memory;Following commands;Safety/judgement;Awareness;Problem solving   Current Attention Level: Sustained   Following Commands: Follows one step commands with increased time Safety/Judgement: Decreased awareness of safety;Decreased awareness of deficits Awareness: Emergent Problem Solving: Slow processing General Comments: Very conversational throughout session making appropriate jokes and interacting well    Exercises      General Comments        Pertinent Vitals/Pain Pain Assessment: Faces Faces Pain Scale: Hurts even more Pain Location: headache and heaviness  Pain Descriptors / Indicators: Headache Pain Intervention(s): Limited activity within patient's tolerance;Monitored during  session;Repositioned    Home Living  Prior Function            PT Goals (current goals can now be found in the care plan section) Acute Rehab PT Goals Patient Stated Goal: to go home PT Goal Formulation: With patient Time For Goal Achievement: 01/03/16 Potential to Achieve Goals: Good Progress towards PT goals: Progressing toward goals    Frequency  Min 4X/week    PT Plan Current plan remains appropriate    Co-evaluation             End of Session Equipment Utilized During Treatment: Gait belt Activity Tolerance: Patient limited by pain;Patient limited by fatigue Patient left: in chair;with call bell/phone within reach;with nursing/sitter in room;with family/visitor present     Time: 1100-1126 PT Time Calculation (min) (ACUTE ONLY): 26 min  Charges:  $Gait Training: 8-22 mins $Therapeutic Activity: 8-22 mins                    G CodesDuncan Dull 01/10/16, 1:08 PM Alben Deeds, [place] DPT  [phone]

## 2015-12-24 NOTE — Progress Notes (Signed)
I met with pt and her spouse at bedside to discuss an inpt rehab admission. I will update [employer] of events of last 24 hrs and seek authorization for admission when medically ready for admit to [place]

## 2015-12-24 NOTE — Progress Notes (Signed)
Occupational Therapy Treatment Patient Details Name: Kelly Wilson MRN: [id number] DOB: 1956/07/02 Today's Date: 12/24/2015    History of present illness 60 y.o. female admitted to [facility] on 12/16/15 for acute onset HA, vertigo and nausea.  CT scan revealed acute SAH involving basilar cisterns, third and fourth ventricles and extending into lateral ventricles.  Mass effect on brainstem was noted.  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 12/23/15 transfer to ICU due to decr arousal Pt with significant PMHx of HTN, spinal stenosis, TIA, L ear hearing loss, ORIF elbow fx, sink graft arm and leg, ankle and knee surgery.       OT comments  Pt demonstrates decr balance with static standing. Pt was able to complete 3n1 transfer and complete peri care in static standing. Pt continues to be excellent CIR candidate and demonstrates decr awareness of deficits.    Follow Up Recommendations  CIR;Supervision/Assistance - 24 hour    Equipment Recommendations  3 in 1 bedside comode    Recommendations for Other Services Rehab consult    Precautions / Restrictions Precautions Precautions: Fall Precaution Comments: static standing ~1 minute with sudden need to sit       Mobility Bed Mobility Overal bed mobility: Needs Assistance Bed Mobility: Supine to Sit     Supine to sit: Min guard     General bed mobility comments: pt able to push up with Left bed rail and exit on the R side of the bed   Transfers Overall transfer level: Needs assistance Equipment used: Rolling walker (2 wheeled) Transfers: Sit to/from Stand Sit to Stand: Mod assist         General transfer comment: cues for sequence    Balance Overall balance assessment: Needs assistance Sitting-balance support: Bilateral upper extremity supported;Feet supported Sitting balance-Leahy Scale: Fair     Standing balance support: Bilateral upper extremity supported;During functional  activity Standing balance-Leahy Scale: Poor                     ADL Overall ADL's : Needs assistance/impaired                         Toilet Transfer: +2 for physical assistance;Minimal assistance;Ambulation;BSC;RW Toilet Transfer Details (indicate cue type and reason): cues to sequence hands and descend to chair Toileting- Clothing Manipulation and Hygiene: Moderate assistance;Sit to/from stand Toileting - Clothing Manipulation Details (indicate cue type and reason): static standign with L UE support to complete peri care. pt unable to remain static standing and uncontrolled descend to 3n1     Functional mobility during ADLs: Moderate assistance;Rolling walker General ADL Comments: pt ambulating ~15 ft from one side of ICU room to just outside the door and with sudden onset of weakness. Pt unable to verbalize what is happening but began to have LOB. OT max (A) to help correct and then chair placed to have patient sit. pt unaware of what changed but states yes when asked are you dizzy. BP remained stable throughout session. pt demonstrates ability to static stand and look outside at teh end of the unit with sudden need to sit again after ~1 minute.       Vision                     Perception     Praxis      Cognition   Behavior During Therapy: Flat affect Overall Cognitive Status: Impaired/Different from baseline Area  of Impairment: Attention;Memory;Following commands;Safety/judgement;Awareness;Problem solving   Current Attention Level: Sustained    Following Commands: Follows one step commands with increased time Safety/Judgement: Decreased awareness of safety;Decreased awareness of deficits Awareness: Emergent Problem Solving: Slow processing General Comments: Pt reports no need to go to CIR and reports ability to complete task without (A) required. Pt unable to return demo and asked "how did I do? Is this a set back?"    Extremity/Trunk Assessment                Exercises     Shoulder Instructions       General Comments      Pertinent Vitals/ Pain       Pain Assessment: Faces Faces Pain Scale: Hurts even more Pain Location: headache after activity Pain Descriptors / Indicators: Headache Pain Intervention(s): Monitored during session;Premedicated before session;Repositioned  Home Living                                          Prior Functioning/Environment              Frequency Min 3X/week     Progress Toward Goals  OT Goals(current goals can now be found in the care plan section)  Progress towards OT goals: Progressing toward goals  Acute Rehab OT Goals Patient Stated Goal: to go home OT Goal Formulation: With patient Time For Goal Achievement: 01/03/16 Potential to Achieve Goals: Good ADL Goals Pt Will Perform Eating: with modified independence Pt Will Perform Grooming: with set-up;sitting Pt Will Perform Upper Body Bathing: with set-up;sitting Pt Will Perform Lower Body Bathing: with min assist;sit to/from stand Pt Will Transfer to Toilet: with min assist;bedside commode;stand pivot transfer Additional ADL Goal #1: Demonstrate anticipatory awareness in minimally distracting environment with min vc during ADL task  Plan Discharge plan remains appropriate    Co-evaluation                 End of Session Equipment Utilized During Treatment: Gait belt;Rolling walker   Activity Tolerance Patient tolerated treatment well   Patient Left in chair;with call bell/phone within reach;with family/visitor present   Nurse Communication Mobility status;Precautions        Time: [id number] OT Time Calculation (min): 27 min  Charges: OT General Charges $OT Visit: 1 Procedure OT Treatments $Self Care/Home Management : 8-22 mins $Therapeutic Activity: 8-22 mins  Parke Poisson B 12/24/2015, 1:00 PM  Jeri Modena   OTR/L Pager: [phone] Office: [phone] .

## 2015-12-24 NOTE — Progress Notes (Signed)
STROKE TEAM PROGRESS NOTE   SUBJECTIVE (INTERVAL HISTORY) Patient sitting up in the chair. Had a bath this am. Got light-headed when she first got up. Therapy recommends CIR. They are following. She states she remembers being confused and is clear this am. Difficulty with complex commands and subtraction of 7s. Husband arrived during rounds. Ok for transfer from stroke standpoint.    OBJECTIVE Temp:  [97.4 F (36.3 C)-98.5 F (36.9 C)] 97.4 F (36.3 C) (06/06 0800) Pulse Rate:  [58-73] 64 (06/06 0800) Cardiac Rhythm:  [-] Normal sinus rhythm (06/06 0800) Resp:  [12-18] 15 (06/06 0800) BP: (93-171)/(48-123) 127/81 mmHg (06/06 0800) SpO2:  [94 %-100 %] 100 % (06/06 0800)  CBC:   Recent Labs Lab 12/17/15 2145 12/23/15 0705  WBC 16.4* 9.1  HGB 13.1 15.2*  HCT 40.2 44.2  MCV 83.1 79.2  PLT 265 0000000    Basic Metabolic Panel:   Recent Labs Lab 12/17/15 2145 12/23/15 0705  NA 139 137  K 3.7 4.6  CL 112* 107  CO2 21* 23  GLUCOSE 152* 110*  BUN 17 14  CREATININE 0.81 0.68  CALCIUM 8.3* 9.1    Lipid Panel:     Component Value Date/Time   CHOL 263* 05/10/2015 0830   TRIG 83 05/10/2015 0830   HDL 76 05/10/2015 0830   CHOLHDL 3.5 05/10/2015 0830   VLDL 17 05/10/2015 0830   LDLCALC 170* 05/10/2015 0830   HgbA1c:  Lab Results  Component Value Date   HGBA1C 5.7* 12/17/2015   Urine Drug Screen: No results found for: LABOPIA, COCAINSCRNUR, LABBENZ, AMPHETMU, THCU, LABBARB   IMAGING I have personally reviewed the radiological images below and agree with the radiology interpretations.  Ct Angio Head W/cm &/or Wo Cm 12/16/2015   Intraventricular hemorrhage is centered in the fourth ventricle and is unchanged from earlier today. 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   Ct Head Wo Contrast 12/23/2015 Post occipital craniotomy for  clipping of aneurysm. Interval development of hydrocephalus. Blood surrounds the operative site. Now noted is blood within the aqueduct with decreased amount blood within the third ventricle and fourth ventricle. Small amount of dependent blood remains within the dilated lateral ventricles.   12/17/2015   1. Postoperative sequela of interval suboccipital craniectomy for surgical clipping of a right PICA territory aneurysm. 2. Persistent but slightly improved hydrocephalus as compared to preoperative exam. Overall intraventricular hemorrhage isw onset headache with nausea and mild vertigo. CT showed acute IVH. stable to slightly improved. Crowding of the basilar cisterns also stable to slightly improved. 3. No definite evolving large vessel territory infarct or other complication.   12/16/2015   Acute hemorrhage in the basilar cisterns, third and fourth ventricle extending into the lateral ventricles. There is mass effect on the brainstem. 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   CUS - Bilateral carotid arteries demonstrated a mild amount of heterogeneous plaque which was not significant 1-39%. Vertebral are patent with antegrade flow.  TTE - - Left ventricle: The cavity size was normal. There was moderate  concentric hypertrophy. Systolic function was normal. The  estimated ejection fraction was in the range of 60% to 65%. Wall  motion was normal; there were no regional wall motion  abnormalities. Doppler parameters are consistent with abnormal  left ventricular relaxation (grade 1 diastolic dysfunction).  EEG -  Abnormal routine inpatient EEG suggestive of at least moderate encephalopathy secondary to generalized cerebral dysfunction, which can be seen due to medication/sedation effect, in addition to diffuse intracranial pathology as well as other systemic toxic metabolic etiology,. No seizures noted. Clinical correlation is  recommended .   TCD - no evidence of vasospasm   PHYSICAL EXAM [name] drowsy, but awake to commands, alert and orientated. Not in distress. Afebrile. Head is nontraumatic. Cardiac exam no murmur or gallop. Lungs are clear to auscultation .  Neurological Exam:  Initially drowsy but on voice, she became awake alert and interactive. normal speech and follows commands well. Eyes movements are full range without nystagmus.Fundi not visualized. Visual fields appear full, Answered simple questions without dysarthria. Hearing appears normal. Face symmetric. Moving all extremities. Normal strength, tone, reflexes and coordination. . Normal sensation. Gait deferred.   ASSESSMENT/PLAN Ms. BAHAR TURTURRO is a 60 y.o. 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 mass effect on the brainstem. 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 Improved hydrocephalus. IVH stable to slightly improved. No large infarct.  CT today with interval development of hydrocephalus, blood around operative site and in aqueduct. Some blood in dilated lateral ventricles.  CUS - unremarkable  EEG - no seizure  TCD - no vasospasm  2D Echo EF 60-65%  LDL 170     HgbA1c 5.7  SCDs for VTE prophylaxis DIET DYS 3 Room service appropriate?: Yes; Fluid consistency:: Thin  aspirin 325 mg daily prior to admission  Ongoing aggressive stroke risk factor management  Therapy recommendations:   CIR  Disposition:  CIR  Will observe one more day in ICU. Transfer to floor tomorrow if neuro stable.  Hydrocephalus  Repeat CT showed interval development of hydrocephalus  In ICU for close monitoring  Improved mental status  Hold off EVD  NSG on board  R PICA Aneurysm  Post Surgical Clipping 12/17/15  Cont Keppra for now  EEG no seizures.    Cont Nimodipine for total 21 days  TCD no evidence of vasospasm  Hypertensive Emergency  BP 171/78 on arrival to ED in setting of neurologic symptoms  SBP goal < 160   Home meds: triamterene-HCTZ 37.5/25, resumed    BP stable  Hyperlipidemia  Home meds:  No statin  LDL 170  Hold statin d/t IVH  Consider statin on discharge  Other Stroke Risk Factors  Obesity, Body mass index is 38.76 kg/(m^2).   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 WBC in CBC 9.1  Hospital day # [place] for Pager information 12/24/2015 9:55 AM   This patient is critically ill due to IVH, PICA aneurysm s/p clipping, hydrocephalus and at significant risk of neurological worsening, death form rebleeding, seizure, hydrocephalus, cerebral edema and brain herniation. This patient's care  requires constant monitoring of vital signs, hemodynamics, respiratory and cardiac monitoring, review of multiple databases, neurological assessment, discussion with family, other specialists and medical decision making of high complexity. I spent 35 minutes of neurocritical care time in the care of this patient.  Pt neuro stable, BP under control. TCD showed no vasospasm. No need for EVD at this time. Will continue to monitor. Transfer to floor tomorrow.  Rosalin Hawking, MD PhD Stroke Neurology 12/24/2015 6:44 PM

## 2015-12-24 NOTE — Progress Notes (Signed)
Speech Language Pathology Treatment: Dysphagia  Patient Details Name: MANSI TORSON MRN: [id number] DOB: 09-28-1955 Today's Date: 12/24/2015 Time: [id number] SLP Time Calculation (min) (ACUTE ONLY): 10 min  Assessment / Plan / Recommendation Clinical Impression  Events of Sunday noted. Now back in ICU; up in chair alert; appears back to baseline prior to unresponsive event. Pt observed with Dys 3 texture and thin liquid for re-initiation of po's. No indications of aspiration or oral difficulty, however recommend to continue Dys 3 due to overall decreased endurance. Will continue to follow for dysphagia and cognition.    HPI HPI: 60 y.o. female history of hypertension, hyperlipidemia, TIA, GERD, spinal stenosis presenting with new onset headache with nausea and mild vertigo. CT scan showed acute hemorrhage involving basilar cisterns, third and fourth ventricles and extending into lateral ventricles. Mass effect on brainstem was noted. Pt underwent craniotomy clipping for intracranial aneurysm. CXR Low lung volumes with mild bibasilar atelectasis.      SLP Plan  Continue with current plan of care     Recommendations  Diet recommendations: Dysphagia 3 (mechanical soft);Thin liquid Liquids provided via: Cup;Straw Medication Administration: Whole meds with puree Supervision: Patient able to self feed;Full supervision/cueing for compensatory strategies Compensations: Minimize environmental distractions;Slow rate;Small sips/bites;Lingual sweep for clearance of pocketing Postural Changes and/or Swallow Maneuvers: Seated upright 90 degrees             General recommendations: Rehab consult Oral Care Recommendations: Oral care BID Follow up Recommendations: Inpatient Rehab Plan: Continue with current plan of care                    [name] 12/24/2015, 9:37 AM  [name].Ed [employer] [phone]

## 2015-12-24 NOTE — Progress Notes (Signed)
  Echocardiogram 2D Echocardiogram has been performed.  Darlina Sicilian M 12/24/2015, 2:58 PM

## 2015-12-24 NOTE — Care Management Note (Signed)
Case Management Note  Patient Details  Name: Kelly Wilson MRN: [id number] Date of Birth: 06-17-1956  Subjective/Objective:  Pt admitted s/p suboccipital craniectomy for clipping of PICA aneurysm on 12/17/15.  PTA, pt independent, lives with spouse.                    Action/Plan: Pt transferred to ICU 12/23/15 after post-op unresponsiveness.  PT/OT recommending CIR.  Will follow progress.    Expected Discharge Date:  Expected Discharge Plan:  [place]  In-House Referral:     Discharge planning Services  CM Consult  Post Acute Care Choice:    Choice offered to:     DME Arranged:    DME Agency:     HH Arranged:    [place] Agency:     Status of Service:  In process, will continue to follow  Medicare Important Message Given:    Date Medicare IM Given:    Medicare IM give by:    Date Additional Medicare IM Given:    Additional Medicare Important Message give by:     If discussed at [place] of Stay Meetings, dates discussed:    Additional Comments:  Reinaldo Raddle, RN, BSN  Trauma/Neuro ICU Case Manager [phone]

## 2015-12-24 NOTE — Progress Notes (Signed)
Patient worked with OT at 1000 and ambulated approx 20 feet before becoming dizzy.  Was positioned in chair. At 1100, PT ambulated patient approx 90 feet with slight dizziness but tolerated fairly well. Patient returned to chair. At 1600 patient used BSC and asked to return to bed. Patient is a 2 assist with transfers due to balance issues. ,  C 4:11 PM

## 2015-12-24 NOTE — Progress Notes (Signed)
No issues overnight. Pt remained awake, alert throughout the day yesterday.  Did exhibit some confusion last night. No complaints this am  EXAM:  BP 127/81 mmHg  Pulse 64  Temp(Src) 97.4 F (36.3 C) (Oral)  Resp 15  Ht 5\' 6"  (1.676 m)  Wt 108.863 kg (240 lb)  BMI 38.76 kg/m2  SpO2 100%  Awake, alert, oriented  Speech fluent, appropriate  CN grossly intact  5/5 BUE/BLE   IMPRESSION:  60 y.o. female s/p SAH/IVH and clipping RPICA. Her episodes of confusion I think are unlikely to be clinical hydrocephalus as they resolve spontaneously, even though she does have ventriculomegaly on CT.  PLAN: - Would cont to observe in ICU for another 24 hours.

## 2015-12-25 ENCOUNTER — Inpatient Hospital Stay (HOSPITAL_COMMUNITY): Payer: BLUE CROSS/BLUE SHIELD

## 2015-12-25 DIAGNOSIS — E871 Hypo-osmolality and hyponatremia: Secondary | ICD-10-CM

## 2015-12-25 DIAGNOSIS — I609 Nontraumatic subarachnoid hemorrhage, unspecified: Secondary | ICD-10-CM

## 2015-12-25 LAB — CBC
HCT: 43.9 % (ref 36.0–46.0)
Hemoglobin: 14.8 g/dL (ref 12.0–15.0)
MCH: 27.1 pg (ref 26.0–34.0)
MCHC: 33.7 g/dL (ref 30.0–36.0)
MCV: 80.3 fL (ref 78.0–100.0)
PLATELETS: 277 10*3/uL (ref 150–400)
RBC: 5.47 MIL/uL — ABNORMAL HIGH (ref 3.87–5.11)
RDW: 13.5 % (ref 11.5–15.5)
WBC: 13.3 10*3/uL — AB (ref 4.0–10.5)

## 2015-12-25 LAB — BASIC METABOLIC PANEL
ANION GAP: 13 (ref 5–15)
BUN: 10 mg/dL (ref 6–20)
CALCIUM: 9 mg/dL (ref 8.9–10.3)
CO2: 23 mmol/L (ref 22–32)
CREATININE: 0.71 mg/dL (ref 0.44–1.00)
Chloride: 94 mmol/L — ABNORMAL LOW (ref 101–111)
GFR calc Af Amer: >60 mL/min (ref >60)
GLUCOSE: 101 mg/dL — AB (ref 65–99)
Potassium: 3.9 mmol/L (ref 3.5–5.1)
Sodium: 130 mmol/L — ABNORMAL LOW (ref 135–145)

## 2015-12-25 LAB — CREATININE, URINE, RANDOM: Creatinine, Urine: 50.54 mg/dL

## 2015-12-25 LAB — GLUCOSE, CAPILLARY: Glucose-Capillary: 108 mg/dL — ABNORMAL HIGH (ref 65–99)

## 2015-12-25 LAB — SODIUM, URINE, RANDOM: Sodium, Ur: 127 mmol/L

## 2015-12-25 LAB — OSMOLALITY, URINE: OSMOLALITY UR: 478 mosm/kg (ref 300–900)

## 2015-12-25 NOTE — Progress Notes (Signed)
Transcranial Doppler  Date POD PCO2 HCT BP  MCA ACA PCA OPHT SIPH VERT Basilar  12/18/15 MS     Right  Left   25  54   -24  -44   30  14   16  15    *  *   -37  -21   *      12/20/15 MS     Right  Left   52  44   *  -25   53  *   17  21   -22  *   -24  -20     -26    12/23/15 MS     Right  Left   49  44   *  -49   60  22   22  19   29   *   -25  -51   *       6/ 7 JE     Right  Left   92  80   *  -70   37  36   18  14   42  42   -30  -32   *            Right  Left                                            Right  Left                                            Right  Left                                        MCA = Middle Cerebral Artery      OPHT = Opthalmic Artery     BASILAR = Basilar Artery   ACA = Anterior Cerebral Artery     SIPH = Carotid Siphon PCA = Posterior Cerebral Artery   VERT = Verterbral Artery                   Normal MCA = 62+\-12 ACA = 50+\-12 PCA = 42+\-23   *Unable to insonate.    4:04 PM 12/25/2015

## 2015-12-25 NOTE — Progress Notes (Signed)
   12/25/15 1354  Clinical Encounter Type  Visited With Patient and family together;Health care provider  Visit Type Follow-up   Chaplain provided follow-up care. Patient's husband was on the phone. Patient is doing better and is being transferred to 85M. Spiritual care services available as needed.   Jeri Lager, Chaplain 12/25/2015 1:55 PM

## 2015-12-25 NOTE — Progress Notes (Signed)
Patient transferred from 3 MW. Patient alert and oriented x 2. Patient made comfortable. Will continue to monitor.

## 2015-12-25 NOTE — Progress Notes (Signed)
Discussed with Dr. Erlinda Hong. I have insurance approval to admit pt to inpt rehab, but no bed available today. I will follow up tomorrow and am hopeful for bed available to admit Thursday. [id number]

## 2015-12-25 NOTE — Progress Notes (Signed)
No issues overnight. Has HA today.  EXAM:  BP 159/80 mmHg  Pulse 82  Temp(Src) 98.3 F (36.8 C) (Oral)  Resp 16  Ht 5\' 6"  (1.676 m)  Wt 108.863 kg (240 lb)  BMI 38.76 kg/m2  SpO2 91%  Awake, alert, oriented  Speech fluent CN grossly intact  5/5 BUE/BLE   IMPRESSION:  60 y.o. female SAH d#10 s/p clipping RPICA. Doing well.  PLAN: - Can transfer to floor - Plan on CIR maybe tomorrow

## 2015-12-25 NOTE — Progress Notes (Signed)
Physical Therapy Treatment Patient Details Name: Kelly Wilson MRN: [id number] DOB: 01/27/1956 Today's Date: 12/25/2015    History of Present Illness 60 y.o. female admitted to [facility] on 12/16/15 for acute onset HA, vertigo and nausea.  CT scan revealed acute SAH involving basilar cisterns, third and fourth ventricles and extending into lateral ventricles.  Mass effect on brainstem was noted.  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 12/23/15 transfer to ICU due to decr arousal Pt with significant PMHx of HTN, spinal stenosis, TIA, L ear hearing loss, ORIF elbow fx, sink graft arm and leg, ankle and knee surgery.        PT Comments    Pt with increased HA today, seemed internally distracted by HA and was unable to physically preform as well as last session.  Significant anterior lean in standing today with inability to safely take steps forward even with two person assist and RW.  PT will continue to follow acutely to attempt to progress mobility.  I continue to recommend CIR level therapies at discharge despite performance today.    Follow Up Recommendations  CIR     Equipment Recommendations  Rolling walker with 5" wheels;3in1 (PT);Wheelchair (measurements PT);Wheelchair cushion (measurements PT)    Recommendations for Other Services Rehab consult     Precautions / Restrictions Precautions Precautions: Fall Precaution Comments: decreased awareness of where she is in space    Mobility  Bed Mobility Overal bed mobility: Needs Assistance;+2 for physical assistance Bed Mobility: Supine to Sit     Supine to sit: +2 for physical assistance;Mod assist Sit to supine: +2 for physical assistance;Mod assist   General bed mobility comments: Two person mod assist to support trunk to get to sitting EOB.  left posterior lean throughout.   Transfers Overall transfer level: Needs assistance Equipment used: Rolling walker (2 wheeled) Transfers: Sit to/from  Stand Sit to Stand: +2 safety/equipment;Mod assist         General transfer comment: Two person mod assist to stand EOB.  Inital stand was a min assist, but the longer we stood there, even with RW the more she pitched forward with her trunk requiring heavy mod assist to prevent forward LOB.  We even tried taking some forward steps with RW, but anterior lean got worse and pt was not moving her feet to take steps to walk even when weight manually weight shifted to the right.    Ambulation/Gait Ambulation/Gait assistance: +2 physical assistance;Mod assist Ambulation Distance (Feet): 3 Feet Assistive device: Rolling walker (2 wheeled)       General Gait Details: Pt with continued difficulty with right weight shift, however, today, more anterior weight shift without actually moving her feet to the point that we had to sit back down after two poor steps forward due to fear that pt would fall forward onto her face (even with two person assist and use of the RW).            Balance Overall balance assessment: Needs assistance Sitting-balance support: Feet supported;Bilateral upper extremity supported Sitting balance-Leahy Scale: Poor Sitting balance - Comments: Two person mod assist EOB due to significant left lateral lean in sitting.    Standing balance support: Bilateral upper extremity supported Standing balance-Leahy Scale: Poor Standing balance comment: Two person mod assist to stand EOB safely with RW.                     Cognition Arousal/Alertness: Lethargic Behavior During Therapy: Flat  affect Overall Cognitive Status: Impaired/Different from baseline Area of Impairment: Attention;Memory;Safety/judgement;Following commands;Awareness;Problem solving   Current Attention Level: Focused   Following Commands: Follows one step commands with increased time Safety/Judgement: Decreased awareness of safety;Decreased awareness of deficits Awareness: Intellectual Problem  Solving: Slow processing;Decreased initiation;Difficulty sequencing;Requires verbal cues;Requires tactile cues General Comments: Pt less conversational today.  Significant increase in HA pain.  Physically, not doing as well as last session, decreased ability to attend to task and initiate movement.            Pertinent Vitals/Pain Pain Assessment: 0-10 Pain Score: 10-Worst pain ever Pain Location: head Pain Descriptors / Indicators: Aching;Grimacing;Guarding Pain Intervention(s): Limited activity within patient's tolerance;Monitored during session;Repositioned           PT Goals (current goals can now be found in the care plan section) Acute Rehab PT Goals Patient Stated Goal: to get better so she can go home, decrease HA pain Progress towards PT goals: Not progressing toward goals - comment (increased HA, decreased ability to attend to task today)    Frequency  Min 4X/week    PT Plan Current plan remains appropriate       End of Session Equipment Utilized During Treatment: Gait belt Activity Tolerance: Patient limited by fatigue;Patient limited by pain Patient left: in bed;with call bell/phone within reach;with bed alarm set     Time: [id number] PT Time Calculation (min) (ACUTE ONLY): 29 min  Charges:  $Therapeutic Activity: 23-37 mins                      B. [place], [place], DPT [phone]   12/25/2015, 2:38 PM

## 2015-12-25 NOTE — Progress Notes (Signed)
STROKE TEAM PROGRESS NOTE   SUBJECTIVE (INTERVAL HISTORY) Pt husband at bedside. Pt was drowsy just received pain medication. She had no appetite as per husband. Na 130, will need to check urine Na and Cre and Osmo.    OBJECTIVE Temp:  [97.8 F (36.6 C)-98.9 F (37.2 C)] 98.2 F (36.8 C) (06/07 0800) Pulse Rate:  [63-80] 74 (06/07 0900) Cardiac Rhythm:  [-] Normal sinus rhythm (06/07 0800) Resp:  [11-19] 16 (06/07 0900) BP: (89-158)/(23-69) 148/62 mmHg (06/07 0900) SpO2:  [92 %-98 %] 96 % (06/07 0900)  CBC:   Recent Labs Lab 12/23/15 0705 12/25/15 0627  WBC 9.1 13.3*  HGB 15.2* 14.8  HCT 44.2 43.9  MCV 79.2 80.3  PLT 278 [id number]   Basic Metabolic Panel:   Recent Labs Lab 12/23/15 0705 12/25/15 0627  NA 137 130*  K 4.6 3.9  CL 107 94*  CO2 23 23  GLUCOSE 110* 101*  BUN 14 10  CREATININE 0.68 0.71  CALCIUM 9.1 9.0   Lipid Panel:     Component Value Date/Time   CHOL 263* 05/10/2015 0830   TRIG 83 05/10/2015 0830   HDL 76 05/10/2015 0830   CHOLHDL 3.5 05/10/2015 0830   VLDL 17 05/10/2015 0830   LDLCALC 170* 05/10/2015 0830   HgbA1c:  Lab Results  Component Value Date   HGBA1C 5.7* 12/17/2015   Urine Drug Screen: No results found for: LABOPIA, COCAINSCRNUR, LABBENZ, AMPHETMU, THCU, LABBARB    IMAGING I have personally reviewed the radiological images below and agree with the radiology interpretations.  Ct Angio Head W/cm &/or Wo Cm 12/16/2015   Intraventricular hemorrhage is centered in the fourth ventricle and is unchanged from earlier today. 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   Ct Head Wo Contrast 12/23/2015 Post occipital craniotomy for clipping of aneurysm. Interval development of hydrocephalus. Blood surrounds the operative site. Now noted is blood within the aqueduct with decreased amount blood within the  third ventricle and fourth ventricle. Small amount of dependent blood remains within the dilated lateral ventricles.   12/17/2015   1. Postoperative sequela of interval suboccipital craniectomy for surgical clipping of a right PICA territory aneurysm. 2. Persistent but slightly improved hydrocephalus as compared to preoperative exam. Overall intraventricular hemorrhage isw onset headache with nausea and mild vertigo. CT showed acute IVH. stable to slightly improved. Crowding of the basilar cisterns also stable to slightly improved. 3. No definite evolving large vessel territory infarct or other complication.   12/16/2015   Acute hemorrhage in the basilar cisterns, third and fourth ventricle extending into the lateral ventricles. There is mass effect on the brainstem. 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   CUS - Bilateral carotid arteries demonstrated a mild amount of heterogeneous plaque which was not significant 1-39%. Vertebral are patent with antegrade flow.  TTE - Left ventricle: The cavity size was normal. There was moderate concentric hypertrophy. Systolic function was normal. The estimated ejection fraction was in the range of 60% to 65%. Wall motion was normal; there were no regional wall motion abnormalities. Doppler parameters are consistent with abnormal left ventricular relaxation (grade 1 diastolic dysfunction).  EEG - Abnormal routine inpatient EEG suggestive of at least moderate encephalopathy secondary to generalized cerebral dysfunction, which can be seen due to medication/sedation effect, in addition to diffuse intracranial pathology as  well as other systemic toxic metabolic etiology,. No seizures noted. Clinical correlation is recommended .   TCD - no evidence of vasospasm   PHYSICAL EXAM [name] drowsy, but awake to commands, alert and orientated. Not in distress. Afebrile. Head is  nontraumatic. Cardiac exam no murmur or gallop. Lungs are clear to auscultation .  Neurological Exam:  Initially drowsy but on voice, she became awake alert and interactive. normal speech and follows commands well. Eyes movements are full range without nystagmus.Fundi not visualized. Visual fields appear full, Answered simple questions without dysarthria. Hearing appears normal. Face symmetric. Moving all extremities. Normal strength, tone, reflexes and coordination. . Normal sensation. Gait deferred.   ASSESSMENT/PLAN Ms. JERRILYN FRANKENBERG is a 60 y.o. 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 mass effect on the brainstem. 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 Improved hydrocephalus. IVH stable to slightly improved. No large infarct.  Repeat CT with interval development of hydrocephalus, blood around operative site and in aqueduct. Some blood in dilated lateral ventricles.  CUS - unremarkable  EEG - no seizure  TCD - no vasospasm  2D Echo EF 60-65%  LDL 170     HgbA1c 5.7  SCDs for VTE prophylaxis DIET DYS 3 Room service appropriate?: Yes; Fluid consistency:: Thin  aspirin 325 mg daily prior to admission  Ongoing aggressive stroke risk factor management  Therapy recommendations:  CIR  Disposition:  CIR  Improving. Transfer to floor today. Consider CIR should they have a bed later today.   Hydrocephalus  Repeat CT showed interval development of  hydrocephalus  Continue to Improve mental status  Hold off EVD  NSG on board  R PICA Aneurysm  Post Surgical Clipping 12/17/15  Cont Keppra for now  EEG no seizures.    Cont Nimodipine for total 21 days  TCD no evidence of vasospasm  Hypertensive Emergency  BP 171/78 on arrival to ED in setting of neurologic symptoms  SBP goal < 160   Home meds: triamterene-HCTZ 37.5/25, resumed    BP stable  Hyperlipidemia  Home meds:  No statin  LDL 170  Hold statin d/t IVH  Consider statin on discharge  Hyponatremia - salt wasting vs. SIADH  Na 137->130  Check urine Na, Cre and Osmo  On IVF @ 75  Not in good appetite  Other Stroke Risk Factors  Obesity, Body mass index is 38.76 kg/(m^2).   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 WBC in CBC 9.1  Hospital day # 9    Rosalin Hawking, MD PhD Stroke Neurology 12/25/2015 10:04 AM

## 2015-12-26 ENCOUNTER — Inpatient Hospital Stay (HOSPITAL_COMMUNITY): Payer: BLUE CROSS/BLUE SHIELD

## 2015-12-26 DIAGNOSIS — E222 Syndrome of inappropriate secretion of antidiuretic hormone: Secondary | ICD-10-CM

## 2015-12-26 LAB — CBC
HCT: 41.1 % (ref 36.0–46.0)
Hemoglobin: 14 g/dL (ref 12.0–15.0)
MCH: 27.2 pg (ref 26.0–34.0)
MCHC: 34.1 g/dL (ref 30.0–36.0)
MCV: 79.8 fL (ref 78.0–100.0)
PLATELETS: 270 10*3/uL (ref 150–400)
RBC: 5.15 MIL/uL — ABNORMAL HIGH (ref 3.87–5.11)
RDW: 13.4 % (ref 11.5–15.5)
WBC: 14.2 10*3/uL — AB (ref 4.0–10.5)

## 2015-12-26 LAB — BASIC METABOLIC PANEL
ANION GAP: 7 (ref 5–15)
BUN: 10 mg/dL (ref 6–20)
CALCIUM: 8.6 mg/dL — AB (ref 8.9–10.3)
CO2: 23 mmol/L (ref 22–32)
Chloride: 96 mmol/L — ABNORMAL LOW (ref 101–111)
Creatinine, Ser: 0.67 mg/dL (ref 0.44–1.00)
GFR calc Af Amer: >60 mL/min (ref >60)
GFR calc non Af Amer: >60 mL/min (ref >60)
GLUCOSE: 112 mg/dL — AB (ref 65–99)
POTASSIUM: 4 mmol/L (ref 3.5–5.1)
Sodium: 126 mmol/L — ABNORMAL LOW (ref 135–145)

## 2015-12-26 MED ORDER — SODIUM CHLORIDE 1 G PO TABS
2.0000 g | ORAL_TABLET | Freq: Two times a day (BID) | ORAL | Status: DC
  Administered 2015-12-26 – 2015-12-27 (×2): 2 g via ORAL
  Filled 2015-12-26 (×3): qty 2

## 2015-12-26 NOTE — Progress Notes (Signed)
No issues overnight.  EXAM:  BP 136/67 mmHg  Pulse 75  Temp(Src) 98.2 F (36.8 C) (Oral)  Resp 20  Ht 5\' 6"  (1.676 m)  Wt 108.863 kg (240 lb)  BMI 38.76 kg/m2  SpO2 97%  Awake, alert, oriented  Speech fluent, appropriate  CN grossly intact  5/5 BUE/BLE   LABS: Serum Na 126  IMPRESSION:  60 y.o. female s/p clipping of ruptured RPICA. Not clinically hydrocephalic. Hyponatremia likely SIADH  PLAN: - Will fluid restrict 1.5L - Salt tabs - Will consider CIR when Na trending toward normal

## 2015-12-26 NOTE — Progress Notes (Signed)
Occupational Therapy Treatment Patient Details Name: Kelly Wilson MRN: [id number] DOB: September 27, 1955 Today's Date: 12/26/2015    History of present illness 61 y.o. female admitted to [facility] on 12/16/15 for acute onset HA, vertigo and nausea.  CT scan revealed acute SAH involving basilar cisterns, third and fourth ventricles and extending into lateral ventricles.  Mass effect on brainstem was noted.  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 12/23/15 transfer to ICU due to decr arousal Pt with significant PMHx of HTN, spinal stenosis, TIA, L ear hearing loss, ORIF elbow fx, sink graft arm and leg, ankle and knee surgery.       OT comments  Pt required mod assist +2 for sit to stand and max assist +2 for stand pivot transfer today. Pt required min assist overall for UB ADL and total assist for LB ADL with max verbal and tactile cues provided throughout for sequencing and initiation. Pt with strong L lateral lean in sitting and anterior lean in standing; able to correct with verbal cueing but unable to sustain. D/c plan remains appropriate. Will continue to follow acutely.   Follow Up Recommendations  CIR;Supervision/Assistance - 24 hour    Equipment Recommendations  3 in 1 bedside comode    Recommendations for Other Services      Precautions / Restrictions Precautions Precautions: Fall Precaution Comments: decreased awareness of where she is in space Restrictions Weight Bearing Restrictions: No       Mobility Bed Mobility Overal bed mobility: Needs Assistance;+2 for physical assistance Bed Mobility: Supine to Sit     Supine to sit: Mod assist     General bed mobility comments: tactile and vc to bring bilat LE to EOB with mod A required to elevate trunk into sitting and scoot hips to EOB with use of bed pad; pt able to assist in scooting hips forward with increased time and cues  Transfers Overall transfer level: Needs assistance Equipment used:  Rolling walker (2 wheeled) Transfers: Sit to/from [facility] Sit to Stand: +2 safety/equipment;Mod assist Stand pivot transfers: Max assist;+2 physical assistance       General transfer comment: Two person mod assist to stand EOB.  Inital stand was a min assist, but the longer we stood there, even with RW the more she pitched forward with her trunk requiring heavy mod assist to prevent forward LOB.  We even tried taking some forward steps with RW, but anterior lean got worse and pt was not moving her feet to take steps to walk even when weight manually weight shifted to the right.      Balance Overall balance assessment: Needs assistance Sitting-balance support: Single extremity supported;Feet supported Sitting balance-Leahy Scale: Poor Sitting balance - Comments: Two person mod assist EOB due to significant left lateral lean in sitting.  Postural control: Left lateral lean;Other (comment) (L lateral lean in sit; strong anterior lean in standing) Standing balance support: Bilateral upper extremity supported Standing balance-Leahy Scale: Poor Standing balance comment: max assist +2 for support in standing                   ADL Overall ADL's : Needs assistance/impaired     Grooming: Minimal assistance;Wash/dry face;Sitting;Cueing for safety;Cueing for sequencing Grooming Details (indicate cue type and reason): Max verbal cues to initiate and sequence Upper Body Bathing: Minimal assitance;Cueing for sequencing;Sitting Upper Body Bathing Details (indicate cue type and reason): Max verbal cues to initiate and sequence Lower Body Bathing: Total assistance;Sit to/from stand  Upper Body Dressing : Moderate assistance;Sitting;Cueing for sequencing Upper Body Dressing Details (indicate cue type and reason): to don/doff hospital gown. Lower Body Dressing: Total assistance Lower Body Dressing Details (indicate cue type and reason): to doff/don socks Toilet Transfer:  Maximal assistance;+2 for physical assistance;Stand-pivot;BSC;RW;Cueing for safety;Cueing for sequencing Toilet Transfer Details (indicate cue type and reason): Simulated by transfer from EOB to chair. Verbal and tactile cues for taking steps and pivoting to chair.         Functional mobility during ADLs: Maximal assistance;+2 for physical assistance;Rolling walker (for stand pivot) General ADL Comments: Pt inconsistently following simple one step commands this session. Strong anterior lean in standing and L lateral lean in sitting; verbal cues to correct.      Vision                     Perception     Praxis      Cognition   Behavior During Therapy: Flat affect Overall Cognitive Status: Impaired/Different from baseline Area of Impairment: Attention;Memory;Safety/judgement;Following commands;Awareness;Problem solving   Current Attention Level: Focused Memory: Decreased short-term memory  Following Commands: Follows one step commands with increased time Safety/Judgement: Decreased awareness of safety;Decreased awareness of deficits Awareness: Intellectual Problem Solving: Slow processing;Decreased initiation;Difficulty sequencing;Requires verbal cues;Requires tactile cues General Comments: very short answers and answered questions and initiated tasks with increased time and cues     Extremity/Trunk Assessment               Exercises     Shoulder Instructions       General Comments      Pertinent Vitals/ Pain       Pain Assessment: Faces Faces Pain Scale: Hurts little more Pain Location: R side HA Pain Descriptors / Indicators: Aching Pain Intervention(s): Monitored during session;Premedicated before session  Home Living   Living Arrangements: Spouse/significant other                                      Prior Functioning/Environment              Frequency Min 3X/week     Progress Toward Goals  OT Goals(current goals can  now be found in the care plan section)  Progress towards OT goals: Progressing toward goals  Acute Rehab OT Goals Patient Stated Goal: none stated OT Goal Formulation: With patient  Plan Discharge plan remains appropriate    Co-evaluation    PT/OT/SLP Co-Evaluation/Treatment: Yes Reason for Co-Treatment: For patient/therapist safety;Necessary to address cognition/behavior during functional activity PT goals addressed during session: Mobility/safety with mobility;Balance OT goals addressed during session: ADL's and self-care;Other (comment) (functional mobility)      End of Session Equipment Utilized During Treatment: Gait belt;Rolling walker   Activity Tolerance Patient limited by lethargy   Patient Left in chair;with call bell/phone within reach;with chair alarm set   Nurse Communication Mobility status        Time: [id number] OT Time Calculation (min): 33 min  Charges: OT General Charges $OT Visit: 1 Procedure OT Treatments $Self Care/Home Management : 8-22 mins  Binnie Kand M.S., OTR/L Pager: [phone]  12/26/2015, 4:05 PM
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
## 2015-12-26 NOTE — Progress Notes (Signed)
Speech Language Pathology Treatment: Dysphagia;Cognitive-Linquistic  Patient Details Name: LATINA VERA MRN: [id number] DOB: 1956-06-09 Today's Date: 12/26/2015 Time: 1350-1402 SLP Time Calculation (min) (ACUTE ONLY): 12 min  Assessment / Plan / Recommendation Clinical Impression  Treatment focused on cognition and dysphagia. Consumption of regular texture (for possible upgrade) and thin liquids via straw yielded no impairments. Pt's cognition for basic information has improved, less perseveration on ideas and thoughts. Delayed thought processing persists compared to baseline and although has good sense of humor may use it to cover for impairments intermittently. She required minimal verbal assist for intellectual awareness and anticipating needs. Continued therapy for executive functions and safety/efficiency with upgraded diet.    HPI HPI: 60 y.o. female history of hypertension, hyperlipidemia, TIA, GERD, spinal stenosis presenting with new onset headache with nausea and mild vertigo. CT scan showed acute hemorrhage involving basilar cisterns, third and fourth ventricles and extending into lateral ventricles. Mass effect on brainstem was noted. Pt underwent craniotomy clipping for intracranial aneurysm. CXR Low lung volumes with mild bibasilar atelectasis.      SLP Plan  Continue with current plan of care     Recommendations  Diet recommendations: Regular;Thin liquid Liquids provided via: Cup;Straw Medication Administration: Whole meds with liquid Supervision: Patient able to self feed;Full supervision/cueing for compensatory strategies Compensations: Minimize environmental distractions;Slow rate;Small sips/bites Postural Changes and/or Swallow Maneuvers: Seated upright 90 degrees             General recommendations: Rehab consult Oral Care Recommendations: Oral care BID Follow up Recommendations: Inpatient Rehab Plan: Continue with current plan of care     GO                 [name] 12/26/2015, 3:28 PM   [name].Ed [employer] [phone]

## 2015-12-26 NOTE — H&P (Signed)
Physical Medicine and Rehabilitation Admission H&P    Chief Complaint  Patient presents with  . Migraine  . Emesis  : HPI: Kelly Wilson is a 60 y.o. right handed female with history of hypertension, TIA. Presented 12/16/2015 with acute onset of headache nausea as well as vertigo. Per chart review patient married independent prior to admission. CT of the head showed acute hemorrhage involving basilar cisterns, third and fourth ventricles and extending into lateral ventricles. Mass effect on brainstem was noted with early hydrocephalus. 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 Kathyrn Sheriff. Placed on Keppra/Valproic acid for seizure prophylaxis. EEG06/07/2015 suggestive of moderate encephalopathy secondary to generalized cerebral dysfunction. No seizure activity. . Decadron protocol as indicated. Close monitoring of blood pressure with Cardene drip as well as Nimotop for a total of 21 days. On the morning of 12/23/2015 patient more lethargic and became obtunded. Rapid response consulted. Patient was transferred to the ICU. Follow-up CT of the head showed interval development of hydrocephalus advised to monitor per Dr. Kathyrn Sheriff. Patient's mental status continued to improve and therapies again reinitiated she was later moved from the unit 12/24/2015 to medical floorAnd again CT of the head completed 12/26/2015 showing decrease in the dilatation of the lateral and third ventricles when compared to prior exam. The fourth ventricular hemorrhage seen previously have decreased in the interval as well.. Findings of mild hyponatremia 126- 130 and urine osmolality 478 suspect SIADH placed on fluid restriction as well as sodium chloride tablets. Nasogastric tube in place Initially and diet advanced to Dysphagia #3 thin  liquid diet. Subcutaneous Lovenox for DVT prophylaxis started 12/22/2015. Physical occupational therapy evaluations completed with recommendations of physical medicine rehabilitation consult. .Patient was admitted for comprehensive rehabilitation program  ROS Constitutional: Negative for fever and chills.  HENT: Positive for hearing loss.  Eyes: Positive for blurred vision and double vision.  Respiratory: Negative for cough and shortness of breath.  Cardiovascular: Positive for leg swelling. Negative for chest pain and palpitations.  Gastrointestinal: Positive for constipation. Negative for nausea and vomiting.   GERD  Genitourinary: Negative for dysuria and hematuria.  Musculoskeletal: Positive for myalgias.  Skin: Negative for rash.  Neurological: Positive for dizziness and headaches. Negative for loss of consciousness.  All other systems reviewed and are negative   Past Medical History  Diagnosis Date  . Hyperlipidemia   . Hypertension   . 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 HYPERLIPIDEMIA 05/17/2007  . HYPERTENSION 05/17/2007  . GERD 08/09/2008  . OSTEOARTHRITIS 05/17/2007  . SPINAL STENOSIS 05/17/2007  . TRANSIENT ISCHEMIC ATTACK, HX OF 05/17/2007  . Left ear hearing loss     Mild   Past Surgical History  Procedure Laterality Date  . Abdominal hysterectomy  1995  . Tubal ligation    . External ear surgery      left side x's 2  . Stapedectomy    . Throidectomy    . Orif elbow fracture  02/2011  . Skin graft full thickness arm    . Skin graft full thickness leg    . Cesarean section  1993  . Ankle surgery  2005  . Thyroidectomy, partial Right 1980  . Knee surgery  02/2011  . Craniotomy Right 12/17/2015    Procedure: Suboccipital CRANIectomy INTRACRANIAL ANEURYSM FOR VERTEBRAL/BASILAR;  Surgeon: Consuella Lose, MD;  Location: [facility]  NEURO ORS;  Service: Neurosurgery;  Laterality: Right;  suboccipital   Family History  Problem Relation Age of Onset  . Coronary  artery disease Mother   . Diabetes Mother   . Coronary artery disease Father   . Diabetes Sister     2 sister with DM  . Cancer Other     uncle had rectal cancer  . Hypertension Other   . Hyperlipidemia Other    Social History:  reports that she has never smoked. She does not have any smokeless tobacco history on file. She reports that she does not drink alcohol or use illicit drugs. Allergies:  Allergies  Allergen Reactions  . Morphine Hives  . Rosuvastatin Other (See Comments)    myalgia   Medications Prior to Admission  Medication Sig Dispense Refill  . gabapentin (NEURONTIN) 300 MG capsule Take 3 capsules (900 mg total) by mouth 3 (three) times daily. 270 capsule 5  . magnesium gluconate (MAGONATE) 500 MG tablet Take 1 tablet (500 mg total) by mouth daily.    Marland Kitchen omeprazole (PRILOSEC) 40 MG capsule Take 40 mg by mouth daily.      Marland Kitchen triamterene-hydrochlorothiazide (MAXZIDE-25) 37.5-25 MG per tablet Take 1 tablet by mouth daily. 90 tablet 3  . [DISCONTINUED] aspirin 325 MG tablet Take 325 mg by mouth daily.      Home: Home Living Family/patient expects to be discharged to:: Private residence Living Arrangements: Spouse/significant other (husband works partime) Available Help at Discharge: Family, Available PRN/intermittently Type of Home: House Home Access: Stairs to enter Technical brewer of Steps: 2 Entrance Stairs-Rails: Right Home Layout: One level Bathroom Shower/Tub: Multimedia programmer: Handicapped height Bathroom Accessibility: Yes Home Equipment: Civil engineer, contracting - built in  Lives With: Spouse   Functional History: Prior Function Level of Independence: Independent Comments: drives, 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  left posterior lean throughout.  Transfers Overall transfer level: Needs assistance Equipment used: Rolling walker (2 wheeled) Transfers: Sit to/from Stand Sit to Stand: +2 safety/equipment, Mod assist General transfer comment: Two person mod assist to stand EOB.  Inital stand was a min assist, but the longer we stood there, even with RW the more she pitched forward with her trunk requiring heavy mod assist to prevent forward LOB.  We even tried taking some forward steps with RW, but anterior lean got worse and pt was not moving her feet to take steps to walk even when weight manually weight shifted to the right.   Ambulation/Gait Ambulation/Gait assistance: +2 physical assistance, Mod assist Ambulation Distance (Feet): 3 Feet Assistive device: Rolling walker (2 wheeled) Gait Pattern/deviations: Step-through pattern, Decreased stride length, Decreased weight shift to right, Decreased weight shift to left, Shuffle, Staggering left, Drifts right/left, Trunk flexed General Gait Details: Pt with continued difficulty with right weight shift, however, today, more anterior weight shift without actually moving her feet to the point that we had to sit back down after two poor steps forward due to fear that pt would fall forward onto her face (even with two person assist and use of the RW).   Gait velocity: decreased Gait velocity interpretation: Below normal speed for  age/gender    ADL: ADL Overall ADL's : Needs assistance/impaired Eating/Feeding: Set up, Supervision/ safety, Sitting Grooming: Minimal assistance, Sitting Upper Body Bathing: Moderate assistance, Sitting Lower Body Bathing: Maximal assistance, Sit to/from stand Upper Body Dressing : Moderate assistance, Sitting Lower Body Dressing: Maximal assistance, Sit to/from stand Toilet Transfer: +2 for physical  assistance, Minimal assistance, Ambulation, BSC, RW Toilet Transfer Details (indicate cue type and reason): cues to sequence hands and descend to chair Toileting- Clothing Manipulation and Hygiene: Moderate assistance, Sit to/from stand Toileting - Clothing Manipulation Details (indicate cue type and reason): static standign with L UE support to complete peri care. pt unable to remain static standing and uncontrolled descend to 3n1 Functional mobility during ADLs: Moderate assistance, Rolling walker General ADL Comments: pt ambulating ~15 ft from one side of ICU room to just outside the door and with sudden onset of weakness. Pt unable to verbalize what is happening but began to have LOB. OT max (A) to help correct and then chair placed to have patient sit. pt unaware of what changed but states yes when asked are you dizzy. BP remained stable throughout session. pt demonstrates ability to static stand and look outside at teh end of the unit with sudden need to sit again after ~1 minute.   Cognition: Cognition Overall Cognitive Status: Impaired/Different from baseline Arousal/Alertness: Awake/alert Orientation Level: Oriented X4 Attention: Sustained Sustained Attention: Impaired Sustained Attention Impairment: Verbal basic, Functional basic Memory: Impaired Memory Impairment: Decreased recall of new information, Decreased short term memory (did recall place/stroke after 15 min delay) Awareness: Impaired Awareness Impairment: Intellectual impairment, Emergent impairment, Anticipatory impairment Problem Solving: Impaired Problem Solving Impairment: Verbal basic, Functional basic Behaviors: Perseveration Safety/Judgment: Impaired Cognition Arousal/Alertness: Lethargic Behavior During Therapy: Flat affect Overall Cognitive Status: Impaired/Different from baseline Area of Impairment: Attention, Memory, Safety/judgement, Following commands, Awareness, Problem solving Orientation Level:  Time Current Attention Level: Focused Memory: Decreased short-term memory Following Commands: Follows one step commands with increased time Safety/Judgement: Decreased awareness of safety, Decreased awareness of deficits Awareness: Intellectual Problem Solving: Slow processing, Decreased initiation, Difficulty sequencing, Requires verbal cues, Requires tactile cues General Comments: Pt less conversational today.  Significant increase in HA pain.  Physically, not doing as well as last session, decreased ability to attend to task and initiate movement.   Physical Exam: Blood pressure 144/64, pulse 88, temperature 98.6 F (37 C), temperature source Oral, resp. rate 18, height 5' 6" (1.676 m), weight 108.863 kg (240 lb), SpO2 95 %. Physical Exam  Vitals reviewed. Constitutional: She appears well-developed. No distress.  obese  HENT:  Head: Normocephalic and atraumatic.  Right Ear: External ear normal.  Eyes: EOM are normal. Pupils are equal, round, and reactive to light.  Neck: Normal range of motion. Neck supple. No JVD present. No tracheal deviation present. No thyromegaly present.  Posterior head incision intact  Cardiovascular: Normal rate and regular rhythm.   Respiratory: Effort normal and breath sounds normal. No respiratory distress.  GI: Soft. Bowel sounds are normal. She exhibits no distension.  Neurological: She is alert.   Makes good eye contact with examiner. She does have some perseveration. Follows simple commands.oriented to year and month. Fair insight and awareness. No focal CN abnormalities. No sensory deficits. Strength 4/5 bilateral upper ext and 3/5 HF, 4 KE and 4 ADFPF. Coordination fair  Skin: She is not diaphoretic.  Psychiatric: She has a normal mood and affect. Her behavior is normal.   she can raise both arms overhead to command. Withdraws to deep  stimuli  Results for orders placed or performed during the hospital encounter of 12/16/15 (from the past 48  hour(s))  CBC     Status: Abnormal   Collection Time: 12/25/15  6:27 AM  Result Value Ref Range   WBC 13.3 (H) 4.0 - 10.5 K/uL   RBC 5.47 (H) 3.87 - 5.11 MIL/uL   Hemoglobin 14.8 12.0 - 15.0 g/dL   HCT 43.9 36.0 - 46.0 %   MCV 80.3 78.0 - 100.0 fL   MCH 27.1 26.0 - 34.0 pg   MCHC 33.7 30.0 - 36.0 g/dL   RDW 13.5 11.5 - 15.5 %   Platelets 277 150 - 400 K/uL  Basic metabolic panel     Status: Abnormal   Collection Time: 12/25/15  6:27 AM  Result Value Ref Range   Sodium 130 (L) 135 - 145 mmol/L   Potassium 3.9 3.5 - 5.1 mmol/L   Chloride 94 (L) 101 - 111 mmol/L   CO2 23 22 - 32 mmol/L   Glucose, Bld 101 (H) 65 - 99 mg/dL   BUN 10 6 - 20 mg/dL   Creatinine, Ser 0.71 0.44 - 1.00 mg/dL   Calcium 9.0 8.9 - 10.3 mg/dL   GFR calc non Af Amer >60 >60 mL/min   GFR calc Af Amer >60 >60 mL/min    Comment: (NOTE) The eGFR has been calculated using the CKD EPI equation. This calculation has not been validated in all clinical situations. eGFR's persistently <60 mL/min signify possible Chronic Kidney Disease.    Anion gap 13 5 - 15  Sodium, urine, random     Status: None   Collection Time: 12/25/15  6:40 PM  Result Value Ref Range   Sodium, Ur 127 mmol/L  Creatinine, urine, random     Status: None   Collection Time: 12/25/15  6:40 PM  Result Value Ref Range   Creatinine, Urine 50.54 mg/dL  Osmolality, urine     Status: None   Collection Time: 12/25/15  6:41 PM  Result Value Ref Range   Osmolality, Ur 478 300 - 900 mOsm/kg  Glucose, capillary     Status: Abnormal   Collection Time: 12/25/15  8:12 PM  Result Value Ref Range   Glucose-Capillary 108 (H) 65 - 99 mg/dL   Comment 1 Notify RN    Comment 2 Document in Chart   CBC     Status: Abnormal   Collection Time: 12/26/15  2:40 AM  Result Value Ref Range   WBC 14.2 (H) 4.0 - 10.5 K/uL   RBC 5.15 (H) 3.87 - 5.11 MIL/uL   Hemoglobin 14.0 12.0 - 15.0 g/dL   HCT 41.1 36.0 - 46.0 %   MCV 79.8 78.0 - 100.0 fL   MCH 27.2 26.0 - 34.0  pg   MCHC 34.1 30.0 - 36.0 g/dL   RDW 13.4 11.5 - 15.5 %   Platelets 270 150 - 400 K/uL  Basic metabolic panel     Status: Abnormal   Collection Time: 12/26/15  2:40 AM  Result Value Ref Range   Sodium 126 (L) 135 - 145 mmol/L   Potassium 4.0 3.5 - 5.1 mmol/L   Chloride 96 (L) 101 - 111 mmol/L   CO2 23 22 - 32 mmol/L   Glucose, Bld 112 (H) 65 - 99 mg/dL   BUN 10 6 - 20 mg/dL   Creatinine, Ser 0.67 0.44 - 1.00 mg/dL   Calcium 8.6 (L) 8.9 - 10.3 mg/dL   GFR calc non Af Amer >  60 >60 mL/min   GFR calc Af Amer >60 >60 mL/min    Comment: (NOTE) The eGFR has been calculated using the CKD EPI equation. This calculation has not been validated in all clinical situations. eGFR's persistently <60 mL/min signify possible Chronic Kidney Disease.    Anion gap 7 5 - 15   No results found.     Medical Problem List and Plan: 1.  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  DVT Prophylaxis/Anticoagulation: Subcutaneous Lovenox initiated 12/22/2015. Monitor for any bleeding episodes 3. Pain Management: Neurontin 900 mg every 8 hours. 4. Hypertension. Maxide  daily,Nimotop 60 mg every 4 hours 21 days initiated 12/17/2015 5. Neuropsych: This patient is capable of making decisions on her own behalf. 6. Skin/Wound Care: Routine skin checks 7. Fluids/Electrolytes/Nutrition: Routine I&O with follow-up chemistries upon admit. Encourage intake 8. Seizure prophylaxis. Keppra 500 mg twice a day, valproic acid 500 mg daily. EEG negative 9. Hyponatremia/SIADH. Follow-up chemistries/fluid restriction/sodium chloride tablets    Post Admission Physician Evaluation: 1. Functional deficits secondary  to IVH, hydrocephalus. 2. Patient is admitted to receive collaborative, interdisciplinary care between the physiatrist, rehab nursing staff, and therapy team. 3. Patient's level of medical complexity  and substantial therapy needs in context of that medical necessity cannot be provided at a lesser intensity of care such as a SNF. 4. Patient has experienced substantial functional loss from his/her baseline which was documented above under the "Functional History" and "Functional Status" headings.  Judging by the patient's diagnosis, physical exam, and functional history, the patient has potential for functional progress which will result in measurable gains while on inpatient rehab.  These gains will be of substantial and practical use upon discharge  in facilitating mobility and self-care at the household level. 5. Physiatrist will provide 24 hour management of medical needs as well as oversight of the therapy plan/treatment and provide guidance as appropriate regarding the interaction of the two. 6. 24 hour rehab nursing will assist with bladder management, bowel management, safety, skin/wound care, disease management, medication administration, pain management and patient education  and help integrate therapy concepts, techniques,education, etc. 7. PT will assess and treat for/with: Lower extremity strength, range of motion, stamina, balance, functional mobility, safety, adaptive techniques and equipment, NMR, cognition, communication, family education, .   Goals are: supervision. 8. OT will assess and treat for/with: ADL's, functional mobility, safety, upper extremity strength, adaptive techniques and equipment, NMR, family ed, ego support, community reintegration.   Goals are: supervision. Therapy may proceed with showering this patient. 9. SLP will assess and treat for/with: cognition, communication, education.  Goals are: supervision. 10. Case Management and Social Worker will assess and treat for psychological issues and discharge planning. 11. Team conference will be held weekly to assess progress toward goals and to determine barriers to discharge. 12. Patient will receive at least 3 hours of  therapy per day at least 5 days per week. 13. ELOS: 14-17 days       14. Prognosis:  excellent     Meredith Staggers, MD, [place] [facility] 12/27/2015   12/26/2015

## 2015-12-26 NOTE — Progress Notes (Signed)
STROKE TEAM PROGRESS NOTE   SUBJECTIVE (INTERVAL HISTORY) Daughter at bedside. Pt feels great. However, her Na continue to drop to 126. Likely SIADH, will put on salt tablet, d/c IVF and 1.5L fluid restriction. Will repeat CT head.    OBJECTIVE Temp:  [98.3 F (36.8 C)-99.1 F (37.3 C)] 98.6 F (37 C) (06/08 0500) Pulse Rate:  [69-88] 88 (06/08 0500) Cardiac Rhythm:  [-] Normal sinus rhythm (06/08 0700) Resp:  [13-18] 18 (06/08 0500) BP: (136-159)/(63-80) 144/64 mmHg (06/08 0500) SpO2:  [91 %-99 %] 95 % (06/08 0500)  CBC:   Recent Labs Lab 12/25/15 0627 12/26/15 0240  WBC 13.3* 14.2*  HGB 14.8 14.0  HCT 43.9 41.1  MCV 80.3 79.8  PLT 277 [id number]   Basic Metabolic Panel:   Recent Labs Lab 12/25/15 0627 12/26/15 0240  NA 130* 126*  K 3.9 4.0  CL 94* 96*  CO2 23 23  GLUCOSE 101* 112*  BUN 10 10  CREATININE 0.71 0.67  CALCIUM 9.0 8.6*   Lipid Panel:     Component Value Date/Time   CHOL 263* 05/10/2015 0830   TRIG 83 05/10/2015 0830   HDL 76 05/10/2015 0830   CHOLHDL 3.5 05/10/2015 0830   VLDL 17 05/10/2015 0830   LDLCALC 170* 05/10/2015 0830   HgbA1c:  Lab Results  Component Value Date   HGBA1C 5.7* 12/17/2015   Urine Drug Screen: No results found for: LABOPIA, COCAINSCRNUR, LABBENZ, AMPHETMU, THCU, LABBARB    IMAGING I have personally reviewed the radiological images below and agree with the radiology interpretations.  Ct Angio Head W/cm &/or Wo Cm 12/16/2015   Intraventricular hemorrhage is centered in the fourth ventricle and is unchanged from earlier today. 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   Ct Head Wo Contrast 12/23/2015 Post occipital craniotomy for clipping of aneurysm. Interval development of hydrocephalus. Blood surrounds the operative site. Now noted is blood within the aqueduct with decreased amount  blood within the third ventricle and fourth ventricle. Small amount of dependent blood remains within the dilated lateral ventricles.   12/17/2015   1. Postoperative sequela of interval suboccipital craniectomy for surgical clipping of a right PICA territory aneurysm. 2. Persistent but slightly improved hydrocephalus as compared to preoperative exam. Overall intraventricular hemorrhage isw onset headache with nausea and mild vertigo. CT showed acute IVH. stable to slightly improved. Crowding of the basilar cisterns also stable to slightly improved. 3. No definite evolving large vessel territory infarct or other complication.   12/16/2015   Acute hemorrhage in the basilar cisterns, third and fourth ventricle extending into the lateral ventricles. There is mass effect on the brainstem. 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   CUS - Bilateral carotid arteries demonstrated a mild amount of heterogeneous plaque which was not significant 1-39%. Vertebral are patent with antegrade flow.  TTE - Left ventricle: The cavity size was normal. There was moderate concentric hypertrophy. Systolic function was normal. The estimated ejection fraction was in the range of 60% to 65%. Wall motion was normal; there were no regional wall motion abnormalities. Doppler parameters are consistent with abnormal left ventricular relaxation (grade 1 diastolic dysfunction).  EEG - Abnormal routine inpatient EEG suggestive of at least moderate encephalopathy secondary to generalized cerebral dysfunction, which can be seen due to medication/sedation effect, in addition to diffuse intracranial pathology  as well as other systemic toxic metabolic etiology,. No seizures noted. Clinical correlation is recommended .   TCD - no evidence of vasospasm  CT head repeat - pending   PHYSICAL EXAM [name] awake alert and orientated. Not in  distress. Afebrile. Head is nontraumatic. Cardiac exam no murmur or gallop. Lungs are clear to auscultation .  Neurological Exam:  Awake alert and interactive. normal speech and follows commands well. Eyes movements are full range without nystagmus.Fundi not visualized. Visual fields appear full, Answered simple questions without dysarthria. Hearing appears normal. Face symmetric. Moving all extremities. Normal strength, tone, reflexes and coordination. . Normal sensation. Gait deferred.   ASSESSMENT/PLAN Ms. MELISS HANAHAN is a 60 y.o. 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 mass effect on the brainstem. 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 Improved hydrocephalus. IVH stable to slightly improved. No large infarct.  Repeat CT with interval development of hydrocephalus, blood around operative site and in aqueduct. Some blood in dilated lateral ventricles.  Repeat CT today   CUS - unremarkable  EEG - no seizure  TCD - no vasospasm  2D Echo EF 60-65%  LDL 170     HgbA1c 5.7  SCDs for VTE prophylaxis DIET DYS 3 Room service appropriate?: Yes; Fluid consistency:: Thin - fluid restriction 1.5L  aspirin 325 mg daily prior to admission  Ongoing aggressive stroke risk factor management  Therapy recommendations:  CIR  Disposition:  pending  Hyponatremia - likely SIADH and less likely SWS due to normal vol  Na 137->130->126  Check urine Na = 127, Cre =  50 and Osmo = 478  D/c IVF  Fluid restriction 1.5L  Salt tablet 2g bid  BMP daily  Discussed with Dr. Kathyrn Sheriff  Consider CIR once Na up trending  Hydrocephalus  Repeat CT showed interval development of hydrocephalus  Continue to Improve mental status  Hold off EVD  NSG on board  Repeat CT today  R PICA Aneurysm  Post Surgical Clipping 12/17/15  Cont Keppra for now  EEG no seizures.    Cont Nimodipine for total 21 days  TCD no evidence of vasospasm  Hypertensive Emergency  BP 171/78 on arrival to ED in setting of neurologic symptoms  SBP goal < 160   Home meds: triamterene-HCTZ 37.5/25, resumed    BP stable  Hyperlipidemia  Home meds:  No statin  LDL 170  Hold statin d/t IVH  Consider statin on discharge  Other Stroke Risk Factors  Obesity, Body mass index is 38.76 kg/(m^2).   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 WBC in CBC 9.1  Hospital day # 10    Rosalin Hawking, MD PhD Stroke Neurology 12/26/2015 9:41 AM

## 2015-12-26 NOTE — Progress Notes (Signed)
Physical Therapy Treatment Patient Details Name: Kelly Wilson MRN: [id number] DOB: [birth year] Today's Date: 12/26/2015    History of Present Illness 60 y.o. female admitted to [facility] on 12/16/15 for acute onset HA, vertigo and nausea.  CT scan revealed acute SAH involving basilar cisterns, third and fourth ventricles and extending into lateral ventricles.  Mass effect on brainstem was noted.  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 12/23/15 transfer to ICU due to decr arousal Pt with significant PMHx of HTN, spinal stenosis, TIA, L ear hearing loss, ORIF elbow fx, sink graft arm and leg, ankle and knee surgery.        PT Comments    Patient is currently mod/max A +2 for OOB transfers and min A for sitting balance EOB. Pt with decreased initiation of tasks and slow processing requiring mod/max cues throughout to attend to tasks. Current plan remains appropriate.   Follow Up Recommendations  CIR     Equipment Recommendations  Rolling walker with 5" wheels;3in1 (PT);Wheelchair (measurements PT);Wheelchair cushion (measurements PT)    Recommendations for Other Services Rehab consult     Precautions / Restrictions Precautions Precautions: Fall Precaution Comments: decreased awareness of where she is in space Restrictions Weight Bearing Restrictions: No    Mobility  Bed Mobility Overal bed mobility: Needs Assistance;+2 for physical assistance Bed Mobility: Supine to Sit     Supine to sit: Mod assist     General bed mobility comments: tactile and vc to bring bilat LE to EOB with mod A required to elevate trunk into sitting and scoot hips to EOB with use of bed pad; pt able to assist in scooting hips forward with increased time and cues  Transfers Overall transfer level: Needs assistance Equipment used: Rolling walker (2 wheeled) Transfers: Sit to/from [facility] Sit to Stand: +2 safety/equipment;Mod assist Stand pivot transfers:  Max assist;+2 physical assistance       General transfer comment: mod A +2 to power up into standing and maintain balance in standing due to heavy anterior lean; pt able to state that she was leaning but unable to correct with verbal cues and physical assist; max A +2 to SPT EOB to chair with pt leaning heavily anteriorly and assist to manage RW and therapist moving pt's feet due to decreased WS R and L; guided descent to chair with pt unable to reach for chair  Ambulation/Gait                 Stairs            Wheelchair Mobility    Modified Rankin (Stroke Patients Only)       Balance Overall balance assessment: Needs assistance Sitting-balance support: Bilateral upper extremity supported;Feet supported Sitting balance-Leahy Scale: Poor Sitting balance - Comments: grossly min A for sitting balance with L lateral and anterior lean; pt realized leaning with cues and able to correct at times Postural control: Left lateral lean;Other (comment) (L lateral lean in sit; strong anterior lean in standing) Standing balance support: Bilateral upper extremity supported Standing balance-Leahy Scale: Poor Standing balance comment: max assist +2 for support in standing                    Cognition Arousal/Alertness: Lethargic Behavior During Therapy: Flat affect Overall Cognitive Status: Impaired/Different from baseline Area of Impairment: Attention;Memory;Safety/judgement;Following commands;Awareness;Problem solving   Current Attention Level: Focused Memory: Decreased short-term memory Following Commands: Follows one step commands with increased time Safety/Judgement: Decreased  awareness of safety;Decreased awareness of deficits Awareness: Intellectual Problem Solving: Slow processing;Decreased initiation;Difficulty sequencing;Requires verbal cues;Requires tactile cues General Comments: very short answers and answered questions and initiated tasks with increased time  and cues     Exercises      General Comments        Pertinent Vitals/Pain Pain Assessment: Faces Faces Pain Scale: Hurts little more Pain Location: R side HA Pain Descriptors / Indicators: Aching Pain Intervention(s): Monitored during session;Premedicated before session    Home Living   Living Arrangements: Spouse/significant other                  Prior Function            PT Goals (current goals can now be found in the care plan section) Acute Rehab PT Goals Patient Stated Goal: none stated Progress towards PT goals: Progressing toward goals    Frequency  Min 4X/week    PT Plan Current plan remains appropriate    Co-evaluation PT/OT/SLP Co-Evaluation/Treatment: Yes Reason for Co-Treatment: For patient/therapist safety;Necessary to address cognition/behavior during functional activity PT goals addressed during session: Mobility/safety with mobility;Balance OT goals addressed during session: ADL's and self-care;Other (comment) (functional mobility)     End of Session Equipment Utilized During Treatment: Gait belt Activity Tolerance: Patient limited by fatigue;Patient limited by pain Patient left: with call bell/phone within reach;in chair;with chair alarm set     Time: 1500-1533 PT Time Calculation (min) (ACUTE ONLY): 33 min  Charges:  $Therapeutic Activity: 8-22 mins                    G Codes:      Salina April, PTA Pager: [phone]   12/26/2015, 4:10 PM

## 2015-12-26 NOTE — Progress Notes (Signed)
I received a call from Dr. Erlinda Hong that pt is currently not medically ready for admit to CIR today due to Sodium levels. I met at bedside with pt and her daughter and they are aware that I will follow up tomorrow. [phone]

## 2015-12-27 ENCOUNTER — Inpatient Hospital Stay (HOSPITAL_COMMUNITY)
Admission: RE | Admit: 2015-12-27 | Discharge: 2016-01-15 | DRG: 149 | Disposition: A | Discharge status: Home / Self Care | Payer: BLUE CROSS/BLUE SHIELD | Source: Intra-hospital | Attending: Physical Medicine & Rehabilitation | Admitting: Physical Medicine & Rehabilitation

## 2015-12-27 ENCOUNTER — Inpatient Hospital Stay (HOSPITAL_COMMUNITY): Payer: BLUE CROSS/BLUE SHIELD

## 2015-12-27 ENCOUNTER — Encounter (HOSPITAL_COMMUNITY): Payer: Self-pay | Admitting: *Deleted

## 2015-12-27 DIAGNOSIS — I615 Nontraumatic intracerebral hemorrhage, intraventricular: Secondary | ICD-10-CM | POA: Diagnosis present

## 2015-12-27 DIAGNOSIS — R519 Headache, unspecified: Secondary | ICD-10-CM | POA: Insufficient documentation

## 2015-12-27 DIAGNOSIS — I10 Essential (primary) hypertension: Secondary | ICD-10-CM | POA: Diagnosis present

## 2015-12-27 DIAGNOSIS — D72819 Decreased white blood cell count, unspecified: Secondary | ICD-10-CM | POA: Insufficient documentation

## 2015-12-27 DIAGNOSIS — R001 Bradycardia, unspecified: Secondary | ICD-10-CM | POA: Diagnosis not present

## 2015-12-27 DIAGNOSIS — H9192 Unspecified hearing loss, left ear: Secondary | ICD-10-CM | POA: Diagnosis present

## 2015-12-27 DIAGNOSIS — G441 Vascular headache, not elsewhere classified: Secondary | ICD-10-CM | POA: Insufficient documentation

## 2015-12-27 DIAGNOSIS — E871 Hypo-osmolality and hyponatremia: Secondary | ICD-10-CM | POA: Insufficient documentation

## 2015-12-27 DIAGNOSIS — K219 Gastro-esophageal reflux disease without esophagitis: Secondary | ICD-10-CM | POA: Diagnosis present

## 2015-12-27 DIAGNOSIS — R630 Anorexia: Secondary | ICD-10-CM | POA: Diagnosis not present

## 2015-12-27 DIAGNOSIS — R451 Restlessness and agitation: Secondary | ICD-10-CM | POA: Insufficient documentation

## 2015-12-27 DIAGNOSIS — K5900 Constipation, unspecified: Secondary | ICD-10-CM | POA: Diagnosis present

## 2015-12-27 DIAGNOSIS — G919 Hydrocephalus, unspecified: Secondary | ICD-10-CM | POA: Diagnosis present

## 2015-12-27 DIAGNOSIS — R5383 Other fatigue: Secondary | ICD-10-CM

## 2015-12-27 DIAGNOSIS — R51 Headache: Secondary | ICD-10-CM

## 2015-12-27 DIAGNOSIS — Z888 Allergy status to other drugs, medicaments and biological substances status: Secondary | ICD-10-CM

## 2015-12-27 DIAGNOSIS — E876 Hypokalemia: Secondary | ICD-10-CM | POA: Insufficient documentation

## 2015-12-27 DIAGNOSIS — G911 Obstructive hydrocephalus: Secondary | ICD-10-CM

## 2015-12-27 DIAGNOSIS — N390 Urinary tract infection, site not specified: Secondary | ICD-10-CM | POA: Diagnosis present

## 2015-12-27 DIAGNOSIS — L740 Miliaria rubra: Secondary | ICD-10-CM | POA: Diagnosis not present

## 2015-12-27 DIAGNOSIS — F419 Anxiety disorder, unspecified: Secondary | ICD-10-CM | POA: Insufficient documentation

## 2015-12-27 DIAGNOSIS — E222 Syndrome of inappropriate secretion of antidiuretic hormone: Secondary | ICD-10-CM | POA: Diagnosis present

## 2015-12-27 DIAGNOSIS — I609 Nontraumatic subarachnoid hemorrhage, unspecified: Secondary | ICD-10-CM

## 2015-12-27 DIAGNOSIS — R42 Dizziness and giddiness: Secondary | ICD-10-CM | POA: Diagnosis present

## 2015-12-27 DIAGNOSIS — Z885 Allergy status to narcotic agent status: Secondary | ICD-10-CM | POA: Diagnosis not present

## 2015-12-27 DIAGNOSIS — Z79899 Other long term (current) drug therapy: Secondary | ICD-10-CM

## 2015-12-27 DIAGNOSIS — Z90710 Acquired absence of both cervix and uterus: Secondary | ICD-10-CM

## 2015-12-27 LAB — BASIC METABOLIC PANEL
ANION GAP: 11 (ref 5–15)
BUN: 16 mg/dL (ref 6–20)
CALCIUM: 8.9 mg/dL (ref 8.9–10.3)
CO2: 22 mmol/L (ref 22–32)
Chloride: 95 mmol/L — ABNORMAL LOW (ref 101–111)
Creatinine, Ser: 0.79 mg/dL (ref 0.44–1.00)
Glucose, Bld: 112 mg/dL — ABNORMAL HIGH (ref 65–99)
Potassium: 4 mmol/L (ref 3.5–5.1)
Sodium: 128 mmol/L — ABNORMAL LOW (ref 135–145)

## 2015-12-27 LAB — CBC
HCT: 39 % (ref 36.0–46.0)
HEMATOCRIT: 40.2 % (ref 36.0–46.0)
HEMOGLOBIN: 13.7 g/dL (ref 12.0–15.0)
Hemoglobin: 13.2 g/dL (ref 12.0–15.0)
MCH: 27.3 pg (ref 26.0–34.0)
MCH: 27.8 pg (ref 26.0–34.0)
MCHC: 33.8 g/dL (ref 30.0–36.0)
MCHC: 34.1 g/dL (ref 30.0–36.0)
MCV: 80.6 fL (ref 78.0–100.0)
MCV: 81.7 fL (ref 78.0–100.0)
PLATELETS: 310 10*3/uL (ref 150–400)
PLATELETS: 325 10*3/uL (ref 150–400)
RBC: 4.84 MIL/uL (ref 3.87–5.11)
RBC: 4.92 MIL/uL (ref 3.87–5.11)
RDW: 13.5 % (ref 11.5–15.5)
RDW: 13.5 % (ref 11.5–15.5)
WBC: 12.2 10*3/uL — ABNORMAL HIGH (ref 4.0–10.5)
WBC: 11.6 10*3/uL — AB (ref 4.0–10.5)

## 2015-12-27 LAB — CREATININE, SERUM
Creatinine, Ser: 0.95 mg/dL (ref 0.44–1.00)
GFR calc non Af Amer: >60 mL/min (ref >60)

## 2015-12-27 MED ORDER — ENOXAPARIN SODIUM 40 MG/0.4ML ~~LOC~~ SOLN
40.0000 mg | SUBCUTANEOUS | Status: DC
  Administered 2015-12-28 – 2016-01-14 (×18): 40 mg via SUBCUTANEOUS
  Filled 2015-12-27 (×18): qty 0.4

## 2015-12-27 MED ORDER — TRIAMTERENE-HCTZ 37.5-25 MG PO TABS
1.0000 | ORAL_TABLET | Freq: Every day | ORAL | Status: DC
  Administered 2015-12-28 – 2016-01-15 (×19): 1 via ORAL
  Filled 2015-12-27 (×19): qty 1

## 2015-12-27 MED ORDER — NIMODIPINE 60 MG/20ML PO SOLN
60.0000 mg | ORAL | Status: DC
Start: 2015-12-27 — End: 2015-12-29
  Filled 2015-12-27 (×16): qty 20

## 2015-12-27 MED ORDER — NIMODIPINE 30 MG PO CAPS
60.0000 mg | ORAL_CAPSULE | ORAL | Status: AC
  Administered 2015-12-27 – 2016-01-07 (×64): 60 mg via ORAL
  Filled 2015-12-27 (×66): qty 2

## 2015-12-27 MED ORDER — MAGNESIUM GLUCONATE 500 MG PO TABS
500.0000 mg | ORAL_TABLET | Freq: Every day | ORAL | Status: DC
  Administered 2015-12-28 – 2016-01-15 (×19): 500 mg via ORAL
  Filled 2015-12-27 (×19): qty 1

## 2015-12-27 MED ORDER — SODIUM CHLORIDE 1 G PO TABS
2.0000 g | ORAL_TABLET | Freq: Two times a day (BID) | ORAL | Status: DC
  Administered 2015-12-27 – 2016-01-15 (×35): 2 g via ORAL
  Filled 2015-12-27 (×39): qty 2

## 2015-12-27 MED ORDER — SORBITOL 70 % SOLN
30.0000 mL | Freq: Every day | Status: DC | PRN
  Filled 2015-12-27: qty 30

## 2015-12-27 MED ORDER — ONDANSETRON HCL 4 MG/2ML IJ SOLN: 4.0000 mg | Freq: Four times a day (QID) | INTRAMUSCULAR | Status: DC | PRN

## 2015-12-27 MED ORDER — PANTOPRAZOLE SODIUM 40 MG PO TBEC
40.0000 mg | DELAYED_RELEASE_TABLET | Freq: Every day | ORAL | Status: DC
  Administered 2015-12-28 – 2016-01-15 (×19): 40 mg via ORAL
  Filled 2015-12-27 (×19): qty 1

## 2015-12-27 MED ORDER — LEVETIRACETAM 500 MG PO TABS
500.0000 mg | ORAL_TABLET | Freq: Two times a day (BID) | ORAL | Status: DC
  Administered 2015-12-27 – 2016-01-15 (×38): 500 mg via ORAL
  Filled 2015-12-27 (×38): qty 1

## 2015-12-27 MED ORDER — VALPROIC ACID 250 MG PO CAPS
500.0000 mg | ORAL_CAPSULE | Freq: Every day | ORAL | Status: DC
  Administered 2015-12-28 – 2016-01-15 (×19): 500 mg via ORAL
  Filled 2015-12-27 (×19): qty 2

## 2015-12-27 MED ORDER — ACETAMINOPHEN 650 MG RE SUPP: 650.0000 mg | RECTAL | Status: DC | PRN

## 2015-12-27 MED ORDER — ONDANSETRON HCL 4 MG PO TABS
4.0000 mg | ORAL_TABLET | Freq: Four times a day (QID) | ORAL | Status: DC | PRN
  Administered 2015-12-28: 4 mg via ORAL
  Filled 2015-12-27: qty 1

## 2015-12-27 MED ORDER — ACETAMINOPHEN 325 MG PO TABS
650.0000 mg | ORAL_TABLET | ORAL | Status: DC | PRN
  Administered 2015-12-27 – 2016-01-03 (×7): 650 mg via ORAL
  Filled 2015-12-27 (×7): qty 2

## 2015-12-27 MED ORDER — ENOXAPARIN SODIUM 40 MG/0.4ML ~~LOC~~ SOLN: 40.0000 mg | SUBCUTANEOUS | Status: DC

## 2015-12-27 MED ORDER — GABAPENTIN 300 MG PO CAPS: 300.0000 mg | ORAL_CAPSULE | Freq: Three times a day (TID) | ORAL | Status: DC
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
## 2015-12-27 NOTE — Progress Notes (Signed)
Physical Therapy Treatment Patient Details Name: Kelly Wilson MRN: [id number] DOB: 08/20/1955 Today's Date: 12/27/2015    History of Present Illness 60 y.o. female admitted to [facility] on 12/16/15 for acute onset HA, vertigo and nausea.  CT scan revealed acute SAH involving basilar cisterns, third and fourth ventricles and extending into lateral ventricles.  Mass effect on brainstem was noted.  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 12/23/15 transfer to ICU due to decr arousal Pt with significant PMHx of HTN, spinal stenosis, TIA, L ear hearing loss, ORIF elbow fx, sink graft arm and leg, ankle and knee surgery.        PT Comments    Patient with improved communication and mobility this session. Currently mod/max A +2 for transfers and ambulation. Current plan remains appropriate.   Follow Up Recommendations  CIR     Equipment Recommendations  Rolling walker with 5" wheels;3in1 (PT);Wheelchair (measurements PT);Wheelchair cushion (measurements PT)    Recommendations for Other Services Rehab consult     Precautions / Restrictions Precautions Precautions: Fall Restrictions Weight Bearing Restrictions: No    Mobility  Bed Mobility               General bed mobility comments: OOB in chair upon arrival  Transfers Overall transfer level: Needs assistance Equipment used: Rolling walker (2 wheeled) Transfers: Sit to/from Stand Sit to Stand: +2 safety/equipment;Mod assist         General transfer comment: assist to power up into standing and for standing balance and safe use of RW; verbal and tactile cues for hand placement and technique  Ambulation/Gait Ambulation/Gait assistance: Mod assist;Max assist;+2 safety/equipment Ambulation Distance (Feet): 50 Feet Assistive device: Rolling walker (2 wheeled) Gait Pattern/deviations: Step-to pattern     General Gait Details: mod A +2 initially and max A needed with increased distance due to  heavy anterior lean; assist for balance and management of RW as well as advancing R foot forward; pt returned to sitting in recliner due to anterior lean and inability to WS to advance feet further; pt recognized she was leaning and corrected lateral lean in standing prior to ambulation but unable to correct anterior lean without max A   Stairs            Wheelchair Mobility    Modified Rankin (Stroke Patients Only)       Balance Overall balance assessment: Needs assistance Sitting-balance support: Feet supported;No upper extremity supported Sitting balance-Leahy Scale: Fair     Standing balance support: Bilateral upper extremity supported Standing balance-Leahy Scale: Poor (grossly with poor balance; zero with ambulation )                      Cognition Arousal/Alertness: Awake/alert Behavior During Therapy: WFL for tasks assessed/performed Overall Cognitive Status: Impaired/Different from baseline Area of Impairment: Attention;Memory;Following commands;Awareness;Problem solving;Safety/judgement   Current Attention Level: Sustained   Following Commands: Follows one step commands with increased time Safety/Judgement: Decreased awareness of safety;Decreased awareness of deficits Awareness: Intellectual Problem Solving: Slow processing;Decreased initiation;Requires verbal cues      Exercises      General Comments General comments (skin integrity, edema, etc.): pt with improved communication and mobiltiy compared to prior session      Pertinent Vitals/Pain Pain Assessment: Faces Faces Pain Scale: No hurt Pain Intervention(s): Monitored during session    Home Living  Prior Function            PT Goals (current goals can now be found in the care plan section) Acute Rehab PT Goals Patient Stated Goal: none stated Progress towards PT goals: Progressing toward goals    Frequency  Min 4X/week    PT Plan Current plan  remains appropriate    Co-evaluation             End of Session Equipment Utilized During Treatment: Gait belt Activity Tolerance: Patient tolerated treatment well Patient left: with call bell/phone within reach;in chair;with chair alarm set;with family/visitor present     Time: 1105-1130 PT Time Calculation (min) (ACUTE ONLY): 25 min  Charges:  $Gait Training: 8-22 mins $Therapeutic Activity: 8-22 mins                    G Codes:      Salina April, PTA Pager: [phone]   12/27/2015, 12:27 PM

## 2015-12-27 NOTE — Progress Notes (Signed)
Discussed with Burnetta Sabin [facility] and pt medically ready for admit to inpt rehab today. I met with pt, her daughter, and her parents at bedside. They are in agreement to admit today. RN CM and SW are aware. I will make the arrangements to admit today. [phone]

## 2015-12-27 NOTE — Discharge Summary (Signed)
Stroke Discharge Summary  Patient ID: Kelly Wilson   MRN: [id number]      DOB: 02-01-1956  Date of Admission: 12/16/2015 Date of Discharge: 12/27/2015  Attending Physician:  Rosalin Hawking, MD, Stroke MD Consulting Physician(s):  Treatment Team:  Consuella Lose, MD  Patient's PCP:  [name], MD  Discharge Diagnoses:  Active Problems:   IVH (intraventricular hemorrhage) (HCC)   Obstructive hydrocephalus   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HCC)   Headache BMI  Body mass index is 38.76 kg/(m^2).  Past Medical History  Diagnosis Date  . Hyperlipidemia   . Hypertension   . 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 HYPERLIPIDEMIA 05/17/2007  . HYPERTENSION 05/17/2007  . GERD 08/09/2008  . OSTEOARTHRITIS 05/17/2007  . SPINAL STENOSIS 05/17/2007  . TRANSIENT ISCHEMIC ATTACK, HX OF 05/17/2007  . Left ear hearing loss     Mild   Past Surgical History  Procedure Laterality Date  . Abdominal hysterectomy  1995  . Tubal ligation    . External ear surgery      left side x's 2  . Stapedectomy    . Throidectomy    . Orif elbow fracture  02/2011  . Skin graft full thickness arm    . Skin graft full thickness leg    . Cesarean section  1993  . Ankle surgery  2005  . Thyroidectomy, partial Right 1980  . Knee surgery  02/2011  . Craniotomy Right 12/17/2015    Procedure: Suboccipital CRANIectomy INTRACRANIAL ANEURYSM FOR VERTEBRAL/BASILAR;  Surgeon: Consuella Lose, MD;  Location: [facility];  Service: Neurosurgery;  Laterality: Right;  suboccipital    Medications to be continued on Rehab .  stroke: mapping our early stages of recovery book   Does not apply Once  . enoxaparin (LOVENOX) injection  40 mg Subcutaneous Q24H  . gabapentin  900 mg Oral Q8H  . levETIRAcetam  500 mg Oral BID  . magnesium gluconate  500 mg Oral Daily  . niMODipine  60 mg Oral Q4H   Or  . NiMODipine  60 mg Per Tube Q4H  . pantoprazole  40 mg Oral Daily  . sodium chloride  2 g Oral BID WC  .  triamterene-hydrochlorothiazide  1 tablet Oral Daily  . valproic acid  500 mg Oral Daily    LABORATORY STUDIES CBC    Component Value Date/Time   WBC 12.2* 12/27/2015 0537   RBC 4.84 12/27/2015 0537   HGB 13.2 12/27/2015 0537   HCT 39.0 12/27/2015 0537   PLT 310 12/27/2015 0537   MCV 80.6 12/27/2015 0537   MCH 27.3 12/27/2015 0537   MCHC 33.8 12/27/2015 0537   RDW 13.5 12/27/2015 0537   LYMPHSABS 3.8 12/16/2015 0355   MONOABS 0.6 12/16/2015 0355   EOSABS 0.2 12/16/2015 0355   BASOSABS 0.0 12/16/2015 0355   CMP    Component Value Date/Time   NA 128* 12/27/2015 0537   NA 140 11/14/2014 1005   K 4.0 12/27/2015 0537   CL 95* 12/27/2015 0537   CO2 22 12/27/2015 0537   GLUCOSE 112* 12/27/2015 0537   GLUCOSE 93 11/14/2014 1005   BUN 16 12/27/2015 0537   BUN 23 11/14/2014 1005   CREATININE 0.79 12/27/2015 0537   CREATININE 0.91 08/03/2013 0754   CALCIUM 8.9 12/27/2015 0537   PROT 5.7* 12/17/2015 2145   PROT 6.3 11/14/2014 1005   ALBUMIN 3.3* 12/17/2015 2145   ALBUMIN 4.2 11/14/2014 1005   AST 25 12/17/2015 2145   ALT  17 12/17/2015 2145   ALKPHOS 46 12/17/2015 2145   BILITOT 0.5 12/17/2015 2145   BILITOT 0.3 11/14/2014 1005   GFRNONAA >60 12/27/2015 0537   GFRNONAA 69 08/03/2013 0754   GFRAA >60 12/27/2015 0537   GFRAA 80 08/03/2013 0754   COAGSNo results found for: INR, PROTIME Lipid Panel    Component Value Date/Time   CHOL 263* 05/10/2015 0830   TRIG 83 05/10/2015 0830   HDL 76 05/10/2015 0830   CHOLHDL 3.5 05/10/2015 0830   VLDL 17 05/10/2015 0830   LDLCALC 170* 05/10/2015 0830   HgbA1C  Lab Results  Component Value Date   HGBA1C 5.7* 12/17/2015   Cardiac Panel (last 3 results) No results for input(s): CKTOTAL, CKMB, TROPONINI, RELINDX in the last 72 hours. Urinalysis    Component Value Date/Time   COLORURINE YELLOW 12/23/2015 1645   APPEARANCEUR CLOUDY* 12/23/2015 1645   LABSPEC 1.016 12/23/2015 1645   PHURINE 8.0 12/23/2015 [place] 12/23/2015 1645   GLUCOSEU NEGATIVE 04/15/2012 0750   HGBUR TRACE* 12/23/2015 1645   BILIRUBINUR NEGATIVE 12/23/2015 1645   KETONESUR NEGATIVE 12/23/2015 1645   PROTEINUR NEGATIVE 12/23/2015 1645   UROBILINOGEN 0.2 04/15/2012 0750   NITRITE NEGATIVE 12/23/2015 1645   LEUKOCYTESUR MODERATE* 12/23/2015 1645   Urine Drug Screen No results found for: LABOPIA, COCAINSCRNUR, LABBENZ, AMPHETMU, THCU, LABBARB  Alcohol Level No results found for: ETH   SIGNIFICANT DIAGNOSTIC STUDIES Ct Angio Head W/cm &/or Wo Cm 12/16/2015 Intraventricular hemorrhage is centered in the fourth ventricle and is unchanged from earlier today. 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   Ct Head Wo Contrast 12/23/2015 Post occipital craniotomy for clipping of aneurysm. Interval development of hydrocephalus. Blood surrounds the operative site. Now noted is blood within the aqueduct with decreased amount blood within the third ventricle and fourth ventricle. Small amount of dependent blood remains within the dilated lateral ventricles.   12/17/2015 1. Postoperative sequela of interval suboccipital craniectomy for surgical clipping of a right PICA territory aneurysm. 2. Persistent but slightly improved hydrocephalus as compared to preoperative exam. Overall intraventricular hemorrhage isw onset headache with nausea and mild vertigo. CT showed acute IVH. stable to slightly improved. Crowding of the basilar cisterns also stable to slightly improved. 3. No definite evolving large vessel territory infarct or other complication.  12/16/2015 Acute hemorrhage in the basilar cisterns, third and fourth ventricle extending into the lateral ventricles. There is mass effect on the brainstem. Increased ventricular system compared to 2009 with prominence of the temporal horns, likely early  hydrocephalus.   CUS - Bilateral carotid arteries demonstrated a mild amount of heterogeneous plaque which was not significant 1-39%. Vertebral are patent with antegrade flow.  TTE - Left ventricle: The cavity size was normal. There was moderate concentric hypertrophy. Systolic function was normal. The estimated ejection fraction was in the range of 60% to 65%. Wall motion was normal; there were no regional wall motion abnormalities. Doppler parameters are consistent with abnormal left ventricular relaxation (grade 1 diastolic dysfunction).  EEG - Abnormal routine inpatient EEG suggestive of at least moderate encephalopathy secondary to generalized cerebral dysfunction, which can be seen due to medication/sedation effect, in addition to diffuse intracranial pathology as well as other systemic toxic metabolic etiology,. No seizures noted. Clinical correlation is recommended .   TCD - no evidence of vasospasm  CT head repeat - pending  HISTORY OF PRESENT ILLNESS Kelly Wilson is an 60 y.o. female history of hypertension and hyperlipidemia, as well as TIA, presenting with new onset headache with nausea and mild vertigo starting at 6 PM 12/15/2015 ([facility]). She has a remote history of migraine headaches. This headache is different from any that she has previously experienced. CT scan of her head showed acute hemorrhage involving basilar cisterns, third and fourth ventricles and extending into lateral ventricles. Mass effect on brainstem was noted. Early hydrocephalus cannot be ruled out. Patient has not experienced a change in speech nor facial droop. She also has not had experienced weakness no numbness involving extremities. Vision has remained unchanged. NIH stroke score at the time of this evaluation was 0. Patient was not administered IV t-PA secondary to IVH. She was admitted for further evaluation and treatment.    HOSPITAL COURSE Kelly Wilson is a 60 y.o. female with history of HTN,  HLD and TIA presenting with ne  Stroke: Intraventricular hemorrhage with mass effect on brainstem secondary to secondary to PICA aneurysm, S/p Surgical Clipping 12/17/15 with subsequent development of hydrocephalus  Resultant confusion which progressed to unresponsiveness, incontinence->improvement->awake alert  CT head third and fourth, and lateral ventriclar hemorrhage. mass effect on the brainstem. 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  CT angio head 5 x 5 mm right PICA aneurysm No MRI d/t hx stapedectomy  Post op CT head with increased lethagy s/p R PICA aneurysm clipping. Improved hydrocephalus. IVH stable to slightly improved. No large infarct.  Repeat CT with interval development of hydrocephalus, blood around operative site and in aqueduct. Some blood in dilated lateral ventricles.  Repeat CT today  CUS - unremarkable  EEG - no seizure  TCD - no vasospasm  2D Echo EF 60-65%  LDL 170   HgbA1c 5.7  SCDs for VTE prophylaxis  DIET DYS 3 Room service appropriate?: Yes; Fluid consistency:: Thin - fluid restriction 1.5L  aspirin 325 mg daily prior to admission  Ongoing aggressive stroke risk factor management  Therapy recommendations: CIR  Disposition: pending  Hyponatremia - likely SIADH and less likely SWS due to normal vol  Na 137->130->126  Check urine Na = 127, Cre = 50 and Osmo = 478  D/c IVF  Fluid restriction 1.5L  Salt tablet 2g bid  BMP daily  Discussed with Dr. Kathyrn Sheriff  Consider CIR once Na up trending  Hydrocephalus  Repeat CT showed interval development of hydrocephalus  Continue to Improve mental status  Hold off EVD  NSG on board  Repeat CT today  R PICA Aneurysm  Post Surgical Clipping 12/17/15  Cont Keppra for now  EEG no seizures.   Cont Nimodipine for total 21 days  TCD no evidence of vasospasm  Hypertensive Emergency  BP 171/78 on arrival to ED in setting of neurologic  symptoms  SBP goal < 160   Home meds: triamterene-HCTZ 37.5/25, resumed   BP stable  Hyperlipidemia  Home meds: No statin  LDL 170  Hold statin d/t IVH  Consider statin on discharge  Other Stroke Risk Factors  Obesity, Body mass index is 38.76 kg/(m^2).   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 WBC in CBC 9.1    DISCHARGE EXAM Blood pressure 146/65, pulse 79, temperature 99.1 F (37.3 C), temperature source Oral,  resp. rate 20, height 5\' 6"  (1.676 m), weight 108.863 kg (240 lb), SpO2 95 %. [name] awake alert and orientated. Not in distress. Afebrile. Head is nontraumatic. Cardiac exam no murmur or gallop. Lungs are clear to auscultation .  Neurological Exam:  Awake alert and interactive. normal speech and follows commands well. Eyes movements are full range without nystagmus.Fundi not visualized. Visual fields appear full, Answered simple questions without dysarthria. Hearing appears normal. Face symmetric. Moving all extremities. Normal strength, tone, reflexes and coordination. . Normal sensation. Gait deferred.  Discharge Diet  Diet regular Room service appropriate?: Yes; Fluid consistency:: Thin; Fluid restriction:: 1500 mL Fluid liquids  DISCHARGE PLAN  Disposition:  Transfer to [facility] for ongoing PT, OT and ST  No antithrombotic for secondary stroke prevention due to blood products.  Recommend ongoing risk factor control by Primary Care Physician at time of discharge from inpatient rehabilitation.  Follow-up [name], MD in 2 weeks following discharge from rehab.  Follow-up with Dr. Rosalin Hawking, Stroke Clinic in 2 months, office to schedule an appointment.   40 minutes were spent preparing  discharge.   Personally examined patient and images, and have participated in and made any corrections needed to history, physical, neuro exam,assessment and plan as stated above.  I have personally obtained the history, evaluated lab date, reviewed imaging studies and agree with radiology interpretations.    Sarina Ill, MD Stroke Neurology [phone] [facility]

## 2015-12-27 NOTE — H&P (View-Only) (Signed)
  Physical Medicine and Rehabilitation Admission H&P    Chief Complaint  Patient presents with  . Migraine  . Emesis  : HPI: Kelly Wilson is a 60 y.o. right handed female with history of hypertension, TIA. Presented 12/16/2015 with acute onset of headache nausea as well as vertigo. Per chart review patient married independent prior to admission. CT of the head showed acute hemorrhage involving basilar cisterns, third and fourth ventricles and extending into lateral ventricles. Mass effect on brainstem was noted with early hydrocephalus. 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 Nundkumar. Placed on Keppra/Valproic acid for seizure prophylaxis. EEG06/07/2015 suggestive of moderate encephalopathy secondary to generalized cerebral dysfunction. No seizure activity. . Decadron protocol as indicated. Close monitoring of blood pressure with Cardene drip as well as Nimotop for a total of 21 days. On the morning of 12/23/2015 patient more lethargic and became obtunded. Rapid response consulted. Patient was transferred to the ICU. Follow-up CT of the head showed interval development of hydrocephalus advised to monitor per Dr. Nundkumar. Patient's mental status continued to improve and therapies again reinitiated she was later moved from the unit 12/24/2015 to medical floorAnd again CT of the head completed 12/26/2015 showing decrease in the dilatation of the lateral and third ventricles when compared to prior exam. The fourth ventricular hemorrhage seen previously have decreased in the interval as well.. Findings of mild hyponatremia 126- 130 and urine osmolality 478 suspect SIADH placed on fluid restriction as well as sodium chloride tablets. Nasogastric tube in place Initially and diet advanced to Dysphagia #3 thin  liquid diet. Subcutaneous Lovenox for DVT prophylaxis started 12/22/2015. Physical occupational therapy evaluations completed with recommendations of physical medicine rehabilitation consult. .Patient was admitted for comprehensive rehabilitation program  ROS Constitutional: Negative for fever and chills.  HENT: Positive for hearing loss.  Eyes: Positive for blurred vision and double vision.  Respiratory: Negative for cough and shortness of breath.  Cardiovascular: Positive for leg swelling. Negative for chest pain and palpitations.  Gastrointestinal: Positive for constipation. Negative for nausea and vomiting.   GERD  Genitourinary: Negative for dysuria and hematuria.  Musculoskeletal: Positive for myalgias.  Skin: Negative for rash.  Neurological: Positive for dizziness and headaches. Negative for loss of consciousness.  All other systems reviewed and are negative   Past Medical History  Diagnosis Date  . Hyperlipidemia   . Hypertension   . 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 HYPERLIPIDEMIA 05/17/2007  . HYPERTENSION 05/17/2007  . GERD 08/09/2008  . OSTEOARTHRITIS 05/17/2007  . SPINAL STENOSIS 05/17/2007  . TRANSIENT ISCHEMIC ATTACK, HX OF 05/17/2007  . Left ear hearing loss     Mild   Past Surgical History  Procedure Laterality Date  . Abdominal hysterectomy  1995  . Tubal ligation    . External ear surgery      left side x's 2  . Stapedectomy    . Throidectomy    . Orif elbow fracture  02/2011  . Skin graft full thickness arm    . Skin graft full thickness leg    . Cesarean section  1993  . Ankle surgery  2005  . Thyroidectomy, partial Right 1980  . Knee surgery  02/2011  . Craniotomy Right 12/17/2015    Procedure: Suboccipital CRANIectomy INTRACRANIAL ANEURYSM FOR VERTEBRAL/BASILAR;  Surgeon: Neelesh Nundkumar, MD;  Location: [facility]   NEURO ORS;  Service: Neurosurgery;  Laterality: Right;  suboccipital   Family History  Problem Relation Age of Onset  . Coronary  artery disease Mother   . Diabetes Mother   . Coronary artery disease Father   . Diabetes Sister     2 sister with DM  . Cancer Other     uncle had rectal cancer  . Hypertension Other   . Hyperlipidemia Other    Social History:  reports that she has never smoked. She does not have any smokeless tobacco history on file. She reports that she does not drink alcohol or use illicit drugs. Allergies:  Allergies  Allergen Reactions  . Morphine Hives  . Rosuvastatin Other (See Comments)    myalgia   Medications Prior to Admission  Medication Sig Dispense Refill  . gabapentin (NEURONTIN) 300 MG capsule Take 3 capsules (900 mg total) by mouth 3 (three) times daily. 270 capsule 5  . magnesium gluconate (MAGONATE) 500 MG tablet Take 1 tablet (500 mg total) by mouth daily.    . omeprazole (PRILOSEC) 40 MG capsule Take 40 mg by mouth daily.      . triamterene-hydrochlorothiazide (MAXZIDE-25) 37.5-25 MG per tablet Take 1 tablet by mouth daily. 90 tablet 3  . [DISCONTINUED] aspirin 325 MG tablet Take 325 mg by mouth daily.      Home: Home Living Family/patient expects to be discharged to:: Private residence Living Arrangements: Spouse/significant other (husband works partime) Available Help at Discharge: Family, Available PRN/intermittently Type of Home: House Home Access: Stairs to enter Entrance Stairs-Number of Steps: 2 Entrance Stairs-Rails: Right Home Layout: One level Bathroom Shower/Tub: Walk-in shower Bathroom Toilet: Handicapped height Bathroom Accessibility: Yes Home Equipment: Shower seat - built in  Lives With: Spouse   Functional History: Prior Function Level of Independence: Independent Comments: drives, 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  left posterior lean throughout.  Transfers Overall transfer level: Needs assistance Equipment used: Rolling walker (2 wheeled) Transfers: Sit to/from Stand Sit to Stand: +2 safety/equipment, Mod assist General transfer comment: Two person mod assist to stand EOB.  Inital stand was a min assist, but the longer we stood there, even with RW the more she pitched forward with her trunk requiring heavy mod assist to prevent forward LOB.  We even tried taking some forward steps with RW, but anterior lean got worse and pt was not moving her feet to take steps to walk even when weight manually weight shifted to the right.   Ambulation/Gait Ambulation/Gait assistance: +2 physical assistance, Mod assist Ambulation Distance (Feet): 3 Feet Assistive device: Rolling walker (2 wheeled) Gait Pattern/deviations: Step-through pattern, Decreased stride length, Decreased weight shift to right, Decreased weight shift to left, Shuffle, Staggering left, Drifts right/left, Trunk flexed General Gait Details: Pt with continued difficulty with right weight shift, however, today, more anterior weight shift without actually moving her feet to the point that we had to sit back down after two poor steps forward due to fear that pt would fall forward onto her face (even with two person assist and use of the RW).   Gait velocity: decreased Gait velocity interpretation: Below normal speed for   age/gender    ADL: ADL Overall ADL's : Needs assistance/impaired Eating/Feeding: Set up, Supervision/ safety, Sitting Grooming: Minimal assistance, Sitting Upper Body Bathing: Moderate assistance, Sitting Lower Body Bathing: Maximal assistance, Sit to/from stand Upper Body Dressing : Moderate assistance, Sitting Lower Body Dressing: Maximal assistance, Sit to/from stand Toilet Transfer: +2 for physical  assistance, Minimal assistance, Ambulation, BSC, RW Toilet Transfer Details (indicate cue type and reason): cues to sequence hands and descend to chair Toileting- Clothing Manipulation and Hygiene: Moderate assistance, Sit to/from stand Toileting - Clothing Manipulation Details (indicate cue type and reason): static standign with L UE support to complete peri care. pt unable to remain static standing and uncontrolled descend to 3n1 Functional mobility during ADLs: Moderate assistance, Rolling walker General ADL Comments: pt ambulating ~15 ft from one side of ICU room to just outside the door and with sudden onset of weakness. Pt unable to verbalize what is happening but began to have LOB. OT max (A) to help correct and then chair placed to have patient sit. pt unaware of what changed but states yes when asked are you dizzy. BP remained stable throughout session. pt demonstrates ability to static stand and look outside at teh end of the unit with sudden need to sit again after ~1 minute.   Cognition: Cognition Overall Cognitive Status: Impaired/Different from baseline Arousal/Alertness: Awake/alert Orientation Level: Oriented X4 Attention: Sustained Sustained Attention: Impaired Sustained Attention Impairment: Verbal basic, Functional basic Memory: Impaired Memory Impairment: Decreased recall of new information, Decreased short term memory (did recall place/stroke after 15 min delay) Awareness: Impaired Awareness Impairment: Intellectual impairment, Emergent impairment, Anticipatory impairment Problem Solving: Impaired Problem Solving Impairment: Verbal basic, Functional basic Behaviors: Perseveration Safety/Judgment: Impaired Cognition Arousal/Alertness: Lethargic Behavior During Therapy: Flat affect Overall Cognitive Status: Impaired/Different from baseline Area of Impairment: Attention, Memory, Safety/judgement, Following commands, Awareness, Problem solving Orientation Level:  Time Current Attention Level: Focused Memory: Decreased short-term memory Following Commands: Follows one step commands with increased time Safety/Judgement: Decreased awareness of safety, Decreased awareness of deficits Awareness: Intellectual Problem Solving: Slow processing, Decreased initiation, Difficulty sequencing, Requires verbal cues, Requires tactile cues General Comments: Pt less conversational today.  Significant increase in HA pain.  Physically, not doing as well as last session, decreased ability to attend to task and initiate movement.   Physical Exam: Blood pressure 144/64, pulse 88, temperature 98.6 F (37 C), temperature source Oral, resp. rate 18, height 5' 6" (1.676 m), weight 108.863 kg (240 lb), SpO2 95 %. Physical Exam  Vitals reviewed. Constitutional: She appears well-developed. No distress.  obese  HENT:  Head: Normocephalic and atraumatic.  Right Ear: External ear normal.  Eyes: EOM are normal. Pupils are equal, round, and reactive to light.  Neck: Normal range of motion. Neck supple. No JVD present. No tracheal deviation present. No thyromegaly present.  Posterior head incision intact  Cardiovascular: Normal rate and regular rhythm.   Respiratory: Effort normal and breath sounds normal. No respiratory distress.  GI: Soft. Bowel sounds are normal. She exhibits no distension.  Neurological: She is alert.   Makes good eye contact with examiner. She does have some perseveration. Follows simple commands.oriented to year and month. Fair insight and awareness. No focal CN abnormalities. No sensory deficits. Strength 4/5 bilateral upper ext and 3/5 HF, 4 KE and 4 ADFPF. Coordination fair  Skin: She is not diaphoretic.  Psychiatric: She has a normal mood and affect. Her behavior is normal.   she can raise both arms overhead to command. Withdraws to deep  stimuli  Results for orders placed or performed during the hospital encounter of 12/16/15 (from the past 48  hour(s))  CBC     Status: Abnormal   Collection Time: 12/25/15  6:27 AM  Result Value Ref Range   WBC 13.3 (H) 4.0 - 10.5 K/uL   RBC 5.47 (H) 3.87 - 5.11 MIL/uL   Hemoglobin 14.8 12.0 - 15.0 g/dL   HCT 43.9 36.0 - 46.0 %   MCV 80.3 78.0 - 100.0 fL   MCH 27.1 26.0 - 34.0 pg   MCHC 33.7 30.0 - 36.0 g/dL   RDW 13.5 11.5 - 15.5 %   Platelets 277 150 - 400 K/uL  Basic metabolic panel     Status: Abnormal   Collection Time: 12/25/15  6:27 AM  Result Value Ref Range   Sodium 130 (L) 135 - 145 mmol/L   Potassium 3.9 3.5 - 5.1 mmol/L   Chloride 94 (L) 101 - 111 mmol/L   CO2 23 22 - 32 mmol/L   Glucose, Bld 101 (H) 65 - 99 mg/dL   BUN 10 6 - 20 mg/dL   Creatinine, Ser 0.71 0.44 - 1.00 mg/dL   Calcium 9.0 8.9 - 10.3 mg/dL   GFR calc non Af Amer >60 >60 mL/min   GFR calc Af Amer >60 >60 mL/min    Comment: (NOTE) The eGFR has been calculated using the CKD EPI equation. This calculation has not been validated in all clinical situations. eGFR's persistently <60 mL/min signify possible Chronic Kidney Disease.    Anion gap 13 5 - 15  Sodium, urine, random     Status: None   Collection Time: 12/25/15  6:40 PM  Result Value Ref Range   Sodium, Ur 127 mmol/L  Creatinine, urine, random     Status: None   Collection Time: 12/25/15  6:40 PM  Result Value Ref Range   Creatinine, Urine 50.54 mg/dL  Osmolality, urine     Status: None   Collection Time: 12/25/15  6:41 PM  Result Value Ref Range   Osmolality, Ur 478 300 - 900 mOsm/kg  Glucose, capillary     Status: Abnormal   Collection Time: 12/25/15  8:12 PM  Result Value Ref Range   Glucose-Capillary 108 (H) 65 - 99 mg/dL   Comment 1 Notify RN    Comment 2 Document in Chart   CBC     Status: Abnormal   Collection Time: 12/26/15  2:40 AM  Result Value Ref Range   WBC 14.2 (H) 4.0 - 10.5 K/uL   RBC 5.15 (H) 3.87 - 5.11 MIL/uL   Hemoglobin 14.0 12.0 - 15.0 g/dL   HCT 41.1 36.0 - 46.0 %   MCV 79.8 78.0 - 100.0 fL   MCH 27.2 26.0 - 34.0  pg   MCHC 34.1 30.0 - 36.0 g/dL   RDW 13.4 11.5 - 15.5 %   Platelets 270 150 - 400 K/uL  Basic metabolic panel     Status: Abnormal   Collection Time: 12/26/15  2:40 AM  Result Value Ref Range   Sodium 126 (L) 135 - 145 mmol/L   Potassium 4.0 3.5 - 5.1 mmol/L   Chloride 96 (L) 101 - 111 mmol/L   CO2 23 22 - 32 mmol/L   Glucose, Bld 112 (H) 65 - 99 mg/dL   BUN 10 6 - 20 mg/dL   Creatinine, Ser 0.67 0.44 - 1.00 mg/dL   Calcium 8.6 (L) 8.9 - 10.3 mg/dL   GFR calc non Af Amer >  60 >60 mL/min   GFR calc Af Amer >60 >60 mL/min    Comment: (NOTE) The eGFR has been calculated using the CKD EPI equation. This calculation has not been validated in all clinical situations. eGFR's persistently <60 mL/min signify possible Chronic Kidney Disease.    Anion gap 7 5 - 15   No results found.     Medical Problem List and Plan: 1.  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  DVT Prophylaxis/Anticoagulation: Subcutaneous Lovenox initiated 12/22/2015. Monitor for any bleeding episodes 3. Pain Management: Neurontin 900 mg every 8 hours. 4. Hypertension. Maxide  daily,Nimotop 60 mg every 4 hours 21 days initiated 12/17/2015 5. Neuropsych: This patient is capable of making decisions on her own behalf. 6. Skin/Wound Care: Routine skin checks 7. Fluids/Electrolytes/Nutrition: Routine I&O with follow-up chemistries upon admit. Encourage intake 8. Seizure prophylaxis. Keppra 500 mg twice a day, valproic acid 500 mg daily. EEG negative 9. Hyponatremia/SIADH. Follow-up chemistries/fluid restriction/sodium chloride tablets    Post Admission Physician Evaluation: 1. Functional deficits secondary  to IVH, hydrocephalus. 2. Patient is admitted to receive collaborative, interdisciplinary care between the physiatrist, rehab nursing staff, and therapy team. 3. Patient's level of medical complexity  and substantial therapy needs in context of that medical necessity cannot be provided at a lesser intensity of care such as a SNF. 4. Patient has experienced substantial functional loss from his/her baseline which was documented above under the "Functional History" and "Functional Status" headings.  Judging by the patient's diagnosis, physical exam, and functional history, the patient has potential for functional progress which will result in measurable gains while on inpatient rehab.  These gains will be of substantial and practical use upon discharge  in facilitating mobility and self-care at the household level. 5. Physiatrist will provide 24 hour management of medical needs as well as oversight of the therapy plan/treatment and provide guidance as appropriate regarding the interaction of the two. 6. 24 hour rehab nursing will assist with bladder management, bowel management, safety, skin/wound care, disease management, medication administration, pain management and patient education  and help integrate therapy concepts, techniques,education, etc. 7. PT will assess and treat for/with: Lower extremity strength, range of motion, stamina, balance, functional mobility, safety, adaptive techniques and equipment, NMR, cognition, communication, family education, .   Goals are: supervision. 8. OT will assess and treat for/with: ADL's, functional mobility, safety, upper extremity strength, adaptive techniques and equipment, NMR, family ed, ego support, community reintegration.   Goals are: supervision. Therapy may proceed with showering this patient. 9. SLP will assess and treat for/with: cognition, communication, education.  Goals are: supervision. 10. Case Management and Social Worker will assess and treat for psychological issues and discharge planning. 11. Team conference will be held weekly to assess progress toward goals and to determine barriers to discharge. 12. Patient will receive at least 3 hours of  therapy per day at least 5 days per week. 13. ELOS: 14-17 days       14. Prognosis:  excellent     Meredith Staggers, MD, [place] 12/27/2015   12/26/2015

## 2015-12-27 NOTE — Interval H&P Note (Signed)
Kelly Wilson was admitted today to Inpatient Rehabilitation with the diagnosis of IVH with hydrocephalus.  The patient's history has been reviewed, patient examined, and there is no change in status.  Patient continues to be appropriate for intensive inpatient rehabilitation.  I have reviewed the patient's chart and labs.  Questions were answered to the patient's satisfaction. The PAPE has been reviewed and assessment remains appropriate.  , T 12/27/2015, 10:24 PM

## 2015-12-27 NOTE — Progress Notes (Signed)
Patient ID: Kelly Wilson, female   DOB: 01-31-1956, 60 y.o.   MRN: [id number] Patient arrived from [facility] with RN via wheelchair along with belongings. Patient oriented to patient room, rehab schedule, rehab process, health resource notebook, fall prevention plan, rehab safety plan and nurse call system. Patient dinner tray delivered with her from [facility]. Patient has no c/o pain.

## 2015-12-27 NOTE — Progress Notes (Signed)
Transcranial Doppler  Date POD PCO2 HCT BP  MCA ACA PCA OPHT SIPH VERT Basilar  12/18/15 MS     Right  Left   25  54   -24  -44   30  14   16  15    *  *   -37  -21   *      12/20/15 MS     Right  Left   52  44   *  -25   53  *   17  21   -22  *   -24  -20     -26    12/23/15 MS     Right  Left   49  44   *  -49   60  22   22  19   29   *   -25  -51   *       6/ 7 JE     Right  Left   92  80   *  -70   37  36   18  14   42  42   -30  -32   *       12/27/15 MS     Right  Left   94  78   -34  -36   71  38   16  21   *  *   *  -22     -26         Right  Left                                            Right  Left                                        MCA = Middle Cerebral Artery      OPHT = Opthalmic Artery     BASILAR = Basilar Artery   ACA = Anterior Cerebral Artery     SIPH = Carotid Siphon PCA = Posterior Cerebral Artery   VERT = Verterbral Artery                   Normal MCA = 62+\-12 ACA = 50+\-12 PCA = 42+\-23   *Unable to insonate.  12/27/2015 1:53 PM Maudry Mayhew, RVT, RDCS, RDMS

## 2015-12-27 NOTE — Care Management Note (Signed)
Case Management Note  Patient Details  Name: Kelly Wilson MRN: [id number] Date of Birth: [birth year]  Subjective/Objective:                    Action/Plan: Patient discharging to CIR today. No further needs per CM.   Expected Discharge Date:  12/23/15               Expected Discharge Plan:  [place]  In-House Referral:     Discharge planning Services  CM Consult  Post Acute Care Choice:    Choice offered to:     DME Arranged:    DME Agency:     HH Arranged:    HH Agency:     Status of Service:  Completed, signed off  Medicare Important Message Given:    Date Medicare IM Given:    Medicare IM give by:    Date Additional Medicare IM Given:    Additional Medicare Important Message give by:     If discussed at [place] of Stay Meetings, dates discussed:    Additional Comments:  Pollie Friar, RN 12/27/2015, 1:53 PM

## 2015-12-27 NOTE — Progress Notes (Signed)
Kelly Blake, MD Physician Signed Physical Medicine and Rehabilitation Consult Note 12/19/2015 1:59 PM  Related encounter: ED to Hosp-Admission (Current) from 12/16/2015 in [place] Collapse All        Physical Medicine and Rehabilitation Consult Reason for Consult: Intraventricular ingestion or hemorrhage Referring Physician: Dr. Leonie Man   HPI: Kelly Wilson is a 60 y.o. right handed female with history of hypertension, TIA. Presented 12/16/2015 with acute onset of headache nausea as well as vertigo. Per chart review patient married independent prior to admission. CT of the head showed acute hemorrhage involving basilar cisterns, third and fourth ventricles and extending into lateral ventricles. Mass effect on brainstem was noted with early hydrocephalus. 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 Kathyrn Sheriff. Placed on Keppra for seizure prophylaxis. EEG is pending. Decadron protocol as indicated. Close monitoring of blood pressure with Cardene drip. Nasogastric tube in place and diet advanced to Dysphagia #2 thin liquid diet. Physical occupational therapy evaluation pending. M.D. has requested physical medicine rehabilitation consult.  Spoke with patient's RN, she notes That the patient has been alert for short periods of time during the day. She usually requires arousal Using physical stimulation. No PT or OT consults have been performed yet.  Review of Systems  Constitutional: Negative for fever and chills.  HENT: Positive for hearing loss.  Eyes: Positive for blurred vision and double vision.  Respiratory: Negative for cough and shortness of breath.  Cardiovascular: Positive for leg swelling. Negative for chest pain and palpitations.    Gastrointestinal: Positive for constipation. Negative for nausea and vomiting.   GERD  Genitourinary: Negative for dysuria and hematuria.  Musculoskeletal: Positive for myalgias.  Skin: Negative for rash.  Neurological: Positive for dizziness and headaches. Negative for loss of consciousness.  All other systems reviewed and are negative.  Past Medical History  Diagnosis Date  . Hyperlipidemia   . Hypertension   . 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 HYPERLIPIDEMIA 05/17/2007  . HYPERTENSION 05/17/2007  . GERD 08/09/2008  . OSTEOARTHRITIS 05/17/2007  . SPINAL STENOSIS 05/17/2007  . TRANSIENT ISCHEMIC ATTACK, HX OF 05/17/2007  . Left ear hearing loss     Mild   Past Surgical History  Procedure Laterality Date  . Abdominal hysterectomy  1995  . Tubal ligation    . External ear surgery      left side x's 2  . Stapedectomy    . Throidectomy    . Orif elbow fracture  02/2011  . Skin graft full thickness arm    . Skin graft full thickness leg    . Cesarean section  1993  . Ankle surgery  2005  . Thyroidectomy, partial Right 1980  . Knee surgery  02/2011  . Craniotomy Right 12/17/2015    Procedure: Suboccipital CRANIectomy INTRACRANIAL ANEURYSM FOR VERTEBRAL/BASILAR; Surgeon: Consuella Lose, MD; Location: [facility]; Service: Neurosurgery; Laterality: Right; suboccipital   Family History  Problem Relation Age of Onset  . Coronary artery disease Mother   . Diabetes Mother   . Coronary artery disease Father   . Diabetes Sister     2 sister with DM  . Cancer Other     uncle had rectal cancer  . Hypertension Other   . Hyperlipidemia Other    Social History:  reports that she has never smoked. She does not have any smokeless tobacco history on file. She reports that she does not drink alcohol or use illicit drugs. Allergies:   Allergies  Allergen Reactions  . Morphine Hives  . Rosuvastatin Other (See Comments)    myalgia   Medications Prior to Admission  Medication Sig Dispense Refill  . gabapentin (NEURONTIN) 300 MG capsule Take 3 capsules (900 mg total) by mouth 3 (three) times daily. 270 capsule 5  . magnesium gluconate (MAGONATE) 500 MG tablet Take 1 tablet (500 mg total) by mouth daily.    Marland Kitchen omeprazole (PRILOSEC) 40 MG capsule Take 40 mg by mouth daily.     Marland Kitchen triamterene-hydrochlorothiazide (MAXZIDE-25) 37.5-25 MG per tablet Take 1 tablet by mouth daily. 90 tablet 3  . [DISCONTINUED] aspirin 325 MG tablet Take 325 mg by mouth daily.      Home: Home Living Family/patient expects to be discharged to:: Private residence Living Arrangements: Spouse/significant other Type of Home: House Lives With: Spouse  Functional History:   Functional Status:  Mobility:          ADL:    Cognition: Cognition Overall Cognitive Status: Impaired/Different from baseline Arousal/Alertness: Awake/alert Orientation Level: Oriented to person, Oriented to place, Oriented to time, Disoriented to situation (UTA- minimally verbal-mumbles only) Attention: Sustained Sustained Attention: Impaired Sustained Attention Impairment: Verbal basic, Functional basic Memory: Impaired Memory Impairment: Decreased recall of new information, Decreased short term memory (did recall place/stroke after 15 min delay) Awareness: Impaired Awareness Impairment: Intellectual impairment, Emergent impairment, Anticipatory impairment Problem Solving: Impaired Problem Solving Impairment: Verbal basic, Functional basic Behaviors: Perseveration Safety/Judgment: Impaired Cognition Overall Cognitive Status: Impaired/Different from baseline  Blood pressure 144/59, pulse 94, temperature 98.1 F (36.7 C), temperature source Oral, resp. rate 16, height 5\' 6"  (1.676 m), weight 108.863 kg (240 lb),  SpO2 100 %. Physical Exam  HENT:  Head: Normocephalic.  Nasogastric tube in place  Eyes:  Pupils reactive to light  Neck: Normal range of motion. Neck supple. No thyromegaly present.  Cardiovascular: Normal rate and regular rhythm.  Respiratory: Effort normal and breath sounds normal.  GI: Soft. Bowel sounds are normal. She exhibits no distension.  Neurological:  Level of alertness varies. Patient does have some perseveration. She was able to provide person and place. Follows some simple commands but would fall back asleep during exam  Skin: Skin is warm and dry.  Patient can raise both arms overhead to command but then will not repeat this. She did not cooperate with remainder of manual muscle testing of the upper and lower limbs. She opens her eyes brieflyto command. She opens her mouth to command and sticks out her tongue  She did not track to command. Sensory testing could not be performed secondary to mental status   Lab Results Last 24 Hours    No results found for this or any previous visit (from the past 24 hour(s)).    Imaging Results (Last 48 hours)    Ct Head Wo Contrast  12/17/2015 CLINICAL DATA: Initial evaluation for acute postoperative lethargy lethargy, expressive aphasia. EXAM: CT HEAD WITHOUT CONTRAST TECHNIQUE: Contiguous axial images were obtained from the base of the skull through the vertex without intravenous contrast. COMPARISON: Prior study from 12/16/2015. FINDINGS: Postoperative changes from interval suboccipital craniectomy for surgical clipping of a right high CT aneurysm are seen. Surgical clip in place Ing region of previously identified aneurysm associated pneumocephalus with gas extending into the adjacent fourth ventricle. Scattered pneumocephalus present throughout the  basilar cisterns and overlying the cerebral convexities. Intraventricular hemorrhage is overall stable to slightly decreased from previous exam with blood seen layering within the  fourth ventricle, third ventricle, and lateral ventricles. Ventricular dilatation is slightly improved, although there is still felt to be hydrocephalus with persistent small amount transependymal flow of CSF. Crowding of the basilar cisterns and at the foramen magnum, stable to slightly improved. No midline shift. No definite evolving large vessel territory infarct identified. Postoperative changes within the suboccipital scalp. No acute abnormality about the orbits. Small amount of layering opacity layering within the sphenoid sinuses. Paranasal sinuses are otherwise clear. No mastoid effusion. IMPRESSION: 1. Postoperative sequela of interval suboccipital craniectomy for surgical clipping of a right PICA territory aneurysm. 2. Persistent but slightly improved hydrocephalus as compared to preoperative exam. Overall intraventricular hemorrhage is stable to slightly improved. Crowding of the basilar cisterns also stable to slightly improved. 3. No definite evolving large vessel territory infarct or other complication. Electronically Signed By: Jeannine Boga M.D. On: 12/17/2015 22:32   Dg Abd Portable 1v  12/18/2015 CLINICAL DATA: NG tube placement. EXAM: PORTABLE ABDOMEN - 1 VIEW COMPARISON: None. FINDINGS: There is evidence of patient's NG tube with tip over the stomach in the left upper quadrant as the non radiopaque side port is in the region of the gastroesophageal junction. Bowel gas pattern is nonobstructive. There are mild degenerative changes of the spine. IMPRESSION: No acute findings. Nasogastric tube with tip over the stomach in the left upper quadrant and side-port in the region of the gastroesophageal junction. Electronically Signed By: Marin Olp M.D. 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 Does the need for close, 24 hr/day medical supervision in  concert with the patient's rehab needs make it unreasonable for this patient to be served in a less intensive setting? Yes 2. Co-Morbidities requiring supervision/potential complications: Cognitive deficits, swallowing impairment, hypertension 3. Due to bladder management, bowel management, safety, skin/wound care, disease management, medication administration, pain management and patient education, does the patient require 24 hr/day rehab nursing? Yes 4. Does the patient require coordinated care of a physician, rehab nurse, PT (1-2 hrs/day, 5 days/week), OT (1-2 hrs/day, 5 days/week) and SLP (.5-1 hrs/day, 5 days/week) to address physical and functional deficits in the context of the above medical diagnosis(es)? Yes Addressing deficits in the following areas: balance, endurance, locomotion, strength, transferring, bowel/bladder control, bathing, dressing, feeding, grooming, toileting, cognition, speech, language, swallowing and psychosocial support 5. Can the patient actively participate in an intensive therapy program of at least 3 hrs of therapy per day at least 5 days per week? No 6. The potential for patient to make measurable gains while on inpatient rehab is currently poor 7. Anticipated functional outcomes upon discharge from inpatient rehab are n/a with PT, n/a with OT, n/a with SLP. 8. Estimated rehab length of stay to reach the above functional goals is: Not applicable 9. Does the patient have adequate social supports and living environment to accommodate these discharge functional goals? Yes 10. Anticipated D/C setting: Home 11. Anticipated post D/C treatments: [place] therapy 12. Overall Rehab/Functional Prognosis: good  RECOMMENDATIONS: This patient's condition is appropriate for continued rehabilitative care in the following setting: CIR once tolerating and participating in PT and OT Patient has agreed to participate in recommended program. Yes Note that insurance prior authorization may  be required for reimbursement for recommended care.  Comment: We will follow along, await PT OT evaluations.  12/19/2015       Revision History     Date/Time User Provider Type Action   12/19/2015 5:58 PM Kelly Blake, MD Physician Sign   12/19/2015 3:05 PM Cathlyn Parsons, PA-C Physician Assistant Pend   View Details Report       Routing History     Date/Time From To Method   12/19/2015 5:58 PM Kelly Blake, MD [name], MD In [place]

## 2015-12-27 NOTE — Progress Notes (Signed)
No issues overnight. Pt has some HA today. Nothing new.  EXAM:  BP 146/65 mmHg  Pulse 79  Temp(Src) 99.1 F (37.3 C) (Oral)  Resp 20  Ht 5\' 6"  (1.676 m)  Wt 108.863 kg (240 lb)  BMI 38.76 kg/m2  SpO2 95%  Awake, alert, oriented  Speech fluent, appropriate  CN grossly intact  5/5 BUE/BLE   LABS: Na 128  IMPRESSION:  60 y.o. female s/p SAH/IVH and clipping RPICA, hyponatremia slightly improved on fluid restriction/salt tabs  PLAN: - Cont 1.5L fluid restriction, Salt tabs

## 2015-12-28 ENCOUNTER — Inpatient Hospital Stay (HOSPITAL_COMMUNITY): Payer: BLUE CROSS/BLUE SHIELD | Admitting: Occupational Therapy

## 2015-12-28 ENCOUNTER — Inpatient Hospital Stay (HOSPITAL_COMMUNITY): Payer: BLUE CROSS/BLUE SHIELD | Admitting: Physical Therapy

## 2015-12-28 ENCOUNTER — Inpatient Hospital Stay (HOSPITAL_COMMUNITY): Payer: BLUE CROSS/BLUE SHIELD | Admitting: Speech Pathology

## 2015-12-28 DIAGNOSIS — E871 Hypo-osmolality and hyponatremia: Secondary | ICD-10-CM

## 2015-12-28 MED ORDER — SODIUM CHLORIDE 0.9 % IV SOLN
INTRAVENOUS | Status: DC
  Administered 2015-12-28 – 2015-12-29 (×2): via INTRAVENOUS

## 2015-12-28 MED ORDER — MEGESTROL ACETATE 400 MG/10ML PO SUSP
400.0000 mg | Freq: Two times a day (BID) | ORAL | Status: DC
  Administered 2015-12-28 – 2016-01-01 (×8): 400 mg via ORAL
  Filled 2015-12-28 (×8): qty 10

## 2015-12-28 MED ORDER — TRAMADOL HCL 50 MG PO TABS
50.0000 mg | ORAL_TABLET | Freq: Four times a day (QID) | ORAL | Status: DC | PRN
  Administered 2015-12-28 – 2016-01-14 (×21): 50 mg via ORAL
  Filled 2015-12-28 (×21): qty 1
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
## 2015-12-28 NOTE — Progress Notes (Signed)
Patient not eating today, low fluid intake. MD notified.

## 2015-12-28 NOTE — Progress Notes (Signed)
Patient reported stabbing headache pain in the temples 9/10. Tylenol given at 0830 but ineffective. MD made aware, orders given for 50 mg tramadol. Patient reported nausea and dizziness with PT, zofran given. Vitals within normal. Patient returned to bed to rest, will continue to monitor.

## 2015-12-28 NOTE — Progress Notes (Signed)
Occupational Therapy Assessment and Plan  Patient Details  Name: Kelly Wilson MRN: [id number] Date of Birth: [birth year]  OT Diagnosis: abnormal posture, acute pain, cognitive deficits, disturbance of vision and coordination disorder, weakness Rehab Potential: Rehab Potential (ACUTE ONLY): Good ELOS: 12-14 days   Today's Date: 12/28/2015 OT Individual Time: 2831-5176 OT Individual Time Calculation (min): 73 min     Problem List:  Patient Active Problem List   Diagnosis Date Noted  . Obstructive hydrocephalus 12/17/2015  . 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[place]) 12/17/2015  . Headache 12/17/2015  . IVH (intraventricular hemorrhage) ([name]) 12/16/2015  . Chest pain 05/10/2015  . Precordial chest pain 05/10/2015  . Chest wall pain 05/10/2015  . Pain in the chest   . Wellness examination 01/24/2015  . Myalgia 11/14/2014  . Muscle cramps 11/14/2014  . Pinched nerve 08/28/2014  . Routine general medical examination at a health care facility 08/12/2013  . Cough 04/21/2012  . Lymphadenitis, acute 10/23/2011  . GERD 08/09/2008  . OTHER DYSPHAGIA 06/18/2008  . 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 NECK DISORDER 05/22/2008  . HEADACHE 08/16/2007  . OTITIS MEDIA, ACUTE, LEFT 08/11/2007  . OTHER ABNORMAL BLOOD CHEMISTRY 07/25/2007  . HYPERLIPIDEMIA 05/17/2007  . Essential hypertension 05/17/2007  . OSTEOARTHRITIS 05/17/2007  . SPINAL STENOSIS 05/17/2007  . History of cardiovascular disorder 05/17/2007    Past Medical History:  Past Medical History  Diagnosis Date  . Hyperlipidemia   . Hypertension   . 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 HYPERLIPIDEMIA 05/17/2007  . HYPERTENSION 05/17/2007  . GERD 08/09/2008  . OSTEOARTHRITIS 05/17/2007  . SPINAL STENOSIS 05/17/2007  . TRANSIENT ISCHEMIC ATTACK, HX OF 05/17/2007  . Left ear hearing loss     Mild   Past Surgical History:  Past Surgical History  Procedure Laterality Date  . Abdominal hysterectomy  1995  . Tubal  ligation    . External ear surgery      left side x's 2  . Stapedectomy    . Throidectomy    . Orif elbow fracture  02/2011  . Skin graft full thickness arm    . Skin graft full thickness leg    . Cesarean section  1993  . Ankle surgery  2005  . Thyroidectomy, partial Right 1980  . Knee surgery  02/2011  . Craniotomy Right 12/17/2015    Procedure: Suboccipital CRANIectomy INTRACRANIAL ANEURYSM FOR VERTEBRAL/BASILAR;  Surgeon: Consuella Lose, MD;  Location: [facility];  Service: Neurosurgery;  Laterality: Right;  suboccipital    Assessment & Plan Clinical Impression: Patient is a 60 y.o. year old female with history of hypertension, TIA. Presented 12/16/2015 with acute onset of headache nausea as well as vertigo. Per chart review patient married independent prior to admission. CT of the head showed acute hemorrhage involving basilar cisterns, third and fourth ventricles and extending into lateral ventricles. Mass effect on brainstem was noted with early hydrocephalus. 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 Kathyrn Sheriff. Placed on Keppra/Valproic acid for seizure prophylaxis. EEG06/07/2015 suggestive of moderate encephalopathy secondary to generalized cerebral dysfunction. No seizure activity. . Decadron protocol as indicated. Close monitoring of blood pressure with Cardene drip as well as Nimotop for a total of 21 days. On the morning of 12/23/2015 patient more lethargic and became obtunded. Rapid response consulted. Patient was transferred to the ICU. Follow-up CT  of the head showed interval development of hydrocephalus advised to monitor per Dr. Kathyrn Sheriff. Patient's mental status continued to improve and therapies again reinitiated she was later moved from the unit 12/24/2015 to medical floorAnd again CT of  the head completed 12/26/2015 showing decrease in the dilatation of the lateral and third ventricles when compared to prior exam. The fourth ventricular hemorrhage seen previously have decreased in the interval as well.. Findings of mild hyponatremia 126- 130 and urine osmolality 478 suspect SIADH placed on fluid restriction as well as sodium chloride tablets. Nasogastric tube in place Initially and diet advanced to Dysphagia #3 thin liquid diet. Subcutaneous Lovenox for DVT prophylaxis started 12/22/2015. Physical occupational therapy evaluations completed with recommendations of physical medicine rehabilitation consult. .Patient was admitted for comprehensive rehabilitation program .  Patient transferred to CIR on 12/27/2015 .    Patient currently requires mod with basic self-care skills and IADL secondary to muscle weakness, decreased cardiorespiratoy endurance, visual inattention, decreased initiation, decreased attention, decreased awareness, decreased problem solving, decreased safety awareness, decreased memory and delayed processing and decreased sitting balance, decreased standing balance, decreased postural control and decreased balance strategies.  Prior to hospitalization, patient could complete ADLs and IADLs with modified independent .  Patient will benefit from skilled intervention to increase independence with basic self-care skills prior to discharge home with care partner.  Anticipate patient will require 24 hour supervision and follow up outpatient.  OT - End of Session Activity Tolerance: Decreased this session Endurance Deficit: Yes Endurance Deficit Description: multiple rest breaks secondary to fatigue OT Assessment Rehab Potential (ACUTE ONLY): Good OT Patient demonstrates impairments in the following area(s): Balance;Cognition;Endurance;Motor;Pain;Safety;Vision OT Basic ADL's Functional Problem(s): Grooming;Bathing;Dressing;Toileting OT Advanced ADL's Functional Problem(s):  Simple Meal Preparation OT Transfers Functional Problem(s): Toilet;Tub/Shower OT Additional Impairment(s): None OT Plan OT Intensity: Minimum of 1-2 x/day, 45 to 90 minutes OT Frequency: 5 out of 7 days OT Duration/Estimated Length of Stay: 12-14 days OT Treatment/Interventions: Balance/vestibular training;DME/adaptive equipment instruction;Patient/family education;Therapeutic Activities;Cognitive remediation/compensation;Psychosocial support;Therapeutic Exercise;UE/LE Strength taining/ROM;Self Care/advanced ADL retraining;Functional mobility training;Community reintegration;Discharge planning;UE/LE Coordination activities;Pain management OT Self Feeding Anticipated Outcome(s): n/a OT Basic Self-Care Anticipated Outcome(s): supervision overall OT Toileting Anticipated Outcome(s): supervision overall OT Bathroom Transfers Anticipated Outcome(s): supervision overall OT Recommendation Patient destination: Home Follow Up Recommendations: 24 hour supervision/assistance Equipment Recommended: To be determined   Skilled Therapeutic Intervention Upon entering the room, pt supine in bed with 9/10 c/o pain described as headache with RN notified. Pt agreeable to OT intervention. OT educated pt on OT purpose, POC, and goals with pt verbalizing understanding and agreement. Pt ambulated 10' with RW and min A but 2 LOB requiring mod A to correct for balance. Pt performed toilet transfer with mod A for lifting. Pt needing assistance with hygiene and clothing management. Pt bathing at shower level while seated on TTB for safety with mod A for sit <>stand to wash buttocks. Pt donning hospital gown and seated in wheelchair at sink for grooming tasks with set up A. Pt remained in wheelchair with quick release belt donned and breakfast tray in front of her. Call bell and all needed items within reach.   OT Evaluation Precautions/Restrictions  Precautions Precautions: Fall Restrictions Weight Bearing  Restrictions: No Vital Signs Therapy Vitals Temp: 98.3 F (36.8 C) Temp Source: Oral Pulse Rate: 80 Resp: 18 BP: (!) 160/58 mmHg (in pain) Patient Position (if appropriate): Lying Oxygen Therapy SpO2: 98 % O2 Device: Not Delivered Pain Pain Assessment Pain Assessment: 0-10 Pain Score: 9  Pain Type:  Surgical pain Pain Location: Head Pain Descriptors / Indicators: Headache Pain Onset: Gradual Patients Stated Pain Goal: 3 Pain Intervention(s): RN made aware Home Living/Prior [place] expects to be discharged to:: Private residence Living Arrangements: Spouse/significant other Available Help at Discharge: Family, Available PRN/intermittently Type of Home: House Home Access: Stairs to enter Technical brewer of Steps: 2 Entrance Stairs-Rails: Right Home Layout: One level Bathroom Shower/Tub: Multimedia programmer: Handicapped height Bathroom Accessibility: Yes  Lives With: Spouse Prior Function Level of Independence: Independent with basic ADLs, Independent with homemaking with ambulation, Independent with gait, Independent with transfers Vocation: Full time employment Comments: drives, 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 [employer] Vision/Perception  Vision- History Baseline Vision/History: Wears glasses Wears Glasses: Reading only Patient Visual Report: No change from baseline Vision- Assessment Vision Assessment?: Vision impaired- to be further tested in functional context  Cognition Overall Cognitive Status: Impaired/Different from baseline Arousal/Alertness: Awake/alert Orientation Level: Person;Place;Situation Person: Oriented Place: Oriented Situation: Oriented Year: 2017 Month: June Day of Week: Other (Comment) (sunday) Memory: Impaired Memory Impairment: Decreased recall of new information;Decreased short term memory;Decreased long term memory Decreased Long Term Memory: Verbal basic Decreased Short Term  Memory: Verbal basic;Functional basic Immediate Memory Recall: Sock;Blue;Bed Memory Recall:  (0/3 correct with cues given) Sustained Attention: Impaired Sustained Attention Impairment: Verbal basic;Functional basic Awareness: Impaired Awareness Impairment: Intellectual impairment Problem Solving: Impaired Problem Solving Impairment: Verbal basic;Functional basic Executive Function:  (all impaired due to lower level deficits ) Safety/Judgment: Impaired Sensation Sensation Light Touch: Appears Intact Hot/Cold: Appears Intact Coordination Gross Motor Movements are Fluid and Coordinated: No Fine Motor Movements are Fluid and Coordinated: No Mobility  Bed Mobility Bed Mobility: Supine to Sit;Sit to Supine Sit to Supine: 3: Mod assist Sit to Supine - Details (indicate cue type and reason): Pt performed sit > supine on flat bed with pt initiating and controlling trunk into supine but required assistance to bring each LE into bed.    Trunk/Postural Assessment  Cervical Assessment Cervical Assessment: Within Functional Limits Thoracic Assessment Thoracic Assessment: Within Functional Limits Lumbar Assessment Lumbar Assessment: Within Functional Limits Postural Control Postural Control: Deficits on evaluation  Balance Balance Balance Assessed: Yes Static Sitting Balance Static Sitting - Balance Support: Right upper extremity supported;Left upper extremity supported Static Sitting - Level of Assistance: 5: Stand by assistance Dynamic Sitting Balance Dynamic Sitting - Balance Support: Left upper extremity supported;Right upper extremity supported;Feet supported Dynamic Sitting - Level of Assistance: 4: Min assist Dynamic Sitting - Balance Activities: Forward lean/weight shifting;Reaching for objects Static Standing Balance Static Standing - Balance Support: Right upper extremity supported;Left upper extremity supported Static Standing - Level of Assistance: 4: Min assist Dynamic  Standing Balance Dynamic Standing - Balance Support: Right upper extremity supported;Left upper extremity supported Dynamic Standing - Level of Assistance: 3: Mod assist Extremity/Trunk Assessment RUE Assessment RUE Assessment: Within Functional Limits LUE Assessment LUE Assessment: Within Functional Limits   See Function Navigator for Current Functional Status.   Refer to Care Plan for Long Term Goals  Recommendations for other services: None  Discharge Criteria: Patient will be discharged from OT if patient refuses treatment 3 consecutive times without medical reason, if treatment goals not met, if there is a change in medical status, if patient makes no progress towards goals or if patient is discharged from hospital.  The above assessment, treatment plan, treatment alternatives and goals were discussed and mutually agreed upon: by patient  Phineas Semen 12/28/2015, 8:08 AM

## 2015-12-28 NOTE — Evaluation (Signed)
Speech Language Pathology Assessment and Plan  Patient Details  Name: Kelly Wilson MRN: [id number] Date of Birth: [birth year]  SLP Diagnosis: Cognitive Impairments  Rehab Potential: Excellent ELOS: 12-14 days     Today's Date: 12/28/2015 SLP Individual Time: 1829-9371 SLP Individual Time Calculation (min): 60 min   Problem List:  Patient Active Problem List   Diagnosis Date Noted  . Obstructive hydrocephalus 12/17/2015  . 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[place]) 12/17/2015  . Headache 12/17/2015  . IVH (intraventricular hemorrhage) ([place]) 12/16/2015  . Chest pain 05/10/2015  . Precordial chest pain 05/10/2015  . Chest wall pain 05/10/2015  . Pain in the chest   . Wellness examination 01/24/2015  . Myalgia 11/14/2014  . Muscle cramps 11/14/2014  . Pinched nerve 08/28/2014  . Routine general medical examination at a health care facility 08/12/2013  . Cough 04/21/2012  . Lymphadenitis, acute 10/23/2011  . GERD 08/09/2008  . OTHER DYSPHAGIA 06/18/2008  . 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 NECK DISORDER 05/22/2008  . HEADACHE 08/16/2007  . OTITIS MEDIA, ACUTE, LEFT 08/11/2007  . OTHER ABNORMAL BLOOD CHEMISTRY 07/25/2007  . HYPERLIPIDEMIA 05/17/2007  . Essential hypertension 05/17/2007  . OSTEOARTHRITIS 05/17/2007  . SPINAL STENOSIS 05/17/2007  . History of cardiovascular disorder 05/17/2007   Past Medical History:  Past Medical History  Diagnosis Date  . Hyperlipidemia   . Hypertension   . 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 HYPERLIPIDEMIA 05/17/2007  . HYPERTENSION 05/17/2007  . GERD 08/09/2008  . OSTEOARTHRITIS 05/17/2007  . SPINAL STENOSIS 05/17/2007  . TRANSIENT ISCHEMIC ATTACK, HX OF 05/17/2007  . Left ear hearing loss     Mild   Past Surgical History:  Past Surgical History  Procedure Laterality Date  . Abdominal hysterectomy  1995  . Tubal ligation    . External ear surgery      left side x's 2  . Stapedectomy    . Throidectomy    . Orif  elbow fracture  02/2011  . Skin graft full thickness arm    . Skin graft full thickness leg    . Cesarean section  1993  . Ankle surgery  2005  . Thyroidectomy, partial Right 1980  . Knee surgery  02/2011  . Craniotomy Right 12/17/2015    Procedure: Suboccipital CRANIectomy INTRACRANIAL ANEURYSM FOR VERTEBRAL/BASILAR;  Surgeon: Consuella Lose, MD;  Location: [facility];  Service: Neurosurgery;  Laterality: Right;  suboccipital    Assessment / Plan / Recommendation Clinical Impression Patient is a 60 y.o. right handed female with history of hypertension, TIA. Presented 12/16/2015 with acute onset of headache nausea as well as vertigo. Per chart review patient married independent prior to admission. CT of the head showed acute hemorrhage involving basilar cisterns, third and fourth ventricles and extending into lateral ventricles. Mass effect on brainstem was noted with early hydrocephalus. 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 Underwent suboccipital craniectomy for clipping of right posterior circulation aneurysm 12/17/2015. EEG 12/19/2015 suggestive of moderate encephalopathy secondary to generalized cerebral dysfunction. On the morning of 12/23/2015 patient more lethargic and became obtunded. Follow-up CT of the head showed interval development of hydrocephalus advised to monitor per Dr. Kathyrn Sheriff. Patient's mental status continued to improve and therapies again reinitiated and again CT of the head completed 12/26/2015 showing decrease in the dilatation of the lateral and third ventricles when compared to prior exam. The fourth ventricular hemorrhage seen previously have decreased  in the interval as well. Findings of mild hyponatremia 126- 130 and urine osmolality 478 suspect SIADH placed on fluid restriction as well as sodium chloride tablets.  Physical occupational therapy evaluations completed with  recommendations of physical medicine rehabilitation consult. Patient was admitted for comprehensive rehabilitation program on 12/27/15.   Patient was administered a cognitive-linguistic evaluation and demonstrates moderate impairments in the areas of initiation, problem solving, awareness, attention, recall and overall safety which impacts her ability to complete functional and familiar tasks safely. Patient also administered a BSE and consumed thin liquids via straw with multiple swallows without overt s/s of aspiration and demonstrated efficient mastication and oral clearance with regular textures. Recommend patient continue current diet. Patient would benefit from skilled SLP intervention to maximize her cognitive function and overall functional independence prior to discharge.    Skilled Therapeutic Interventions          Administered a cognitive-linguistic evaluation and BSE. Please see above for details.   SLP Assessment  Patient will need skilled Speech Lanaguage Pathology Services during CIR admission    Recommendations  SLP Diet Recommendations: Age appropriate regular solids;Thin Liquid Administration via: Cup;Straw Medication Administration: Whole meds with liquid Supervision: Patient able to self feed;Full supervision/cueing for compensatory strategies Compensations: Minimize environmental distractions;Slow rate;Small sips/bites Postural Changes and/or Swallow Maneuvers: Seated upright 90 degrees Oral Care Recommendations: Oral care BID Recommendations for Other Services: Neuropsych consult Patient destination: Home Follow up Recommendations: Outpatient SLP;Home Health SLP;24 hour supervision/assistance Equipment Recommended: None recommended by SLP    SLP Frequency 3 to 5 out of 7 days   SLP Duration  SLP Intensity  SLP Treatment/Interventions 12-14 days   Minumum of 1-2 x/day, 30 to 90 minutes  Cognitive remediation/compensation;Cueing hierarchy;Functional  tasks;Patient/family education;Therapeutic Activities;Internal/external aids;Environmental controls    Pain No reports of pain  Prior Functioning Type of Home: House  Lives With: Spouse Available Help at Discharge: Family;Available 24 hours/day (husband works but daughters to rotate staying with her) Vocation: Part time employment  Function:  Eating Eating   Modified Consistency Diet: No Eating Assist Level: Swallowing techniques: self managed;Set up assist for   Eating Set Up Assist For: Opening containers       Cognition Comprehension Comprehension assist level: Understands basic 75 - 89% of the time/ requires cueing 10 - 24% of the time  Expression   Expression assist level: Expresses basic 75 - 89% of the time/requires cueing 10 - 24% of the time. Needs helper to occlude trach/needs to repeat words.  Social Interaction Social Interaction assist level: Interacts appropriately 50 - 74% of the time - May be physically or verbally inappropriate.  Problem Solving Problem solving assist level: Solves basic 50 - 74% of the time/requires cueing 25 - 49% of the time  Memory Memory assist level: Recognizes or recalls 50 - 74% of the time/requires cueing 25 - 49% of the time   Short Term Goals: Week 1: SLP Short Term Goal 1 (Week 1): Patient will initiate functional tasks with Min A verbal cues.  SLP Short Term Goal 2 (Week 1): Patient will demonstrate functional problem solving for basic and familiar tasks with Min A verbal cues.  SLP Short Term Goal 3 (Week 1): Patient will utilize external memory aids to recall new, daily information with Min A question and verbal cues.  SLP Short Term Goal 4 (Week 1): Patient will utilize call bell to request assistance in 25% of observable opportunities.  SLP Short Term Goal 5 (Week 1): Patient will self-monitor and  correct errors during functional tasks/verbal conversation with Min A question cues.  SLP Short Term Goal 6 (Week 1): Patient will  demonstrate sustained attention to a functional task for ~30 minutes with Min A verbal cues for redirection.   Refer to Care Plan for Long Term Goals  Recommendations for other services: Neuropsych  Discharge Criteria: Patient will be discharged from SLP if patient refuses treatment 3 consecutive times without medical reason, if treatment goals not met, if there is a change in medical status, if patient makes no progress towards goals or if patient is discharged from hospital.  The above assessment, treatment plan, treatment alternatives and goals were discussed and mutually agreed upon: by patient and by family  ,  12/28/2015, 3:39 PM

## 2015-12-28 NOTE — Progress Notes (Signed)
[facility] PHYSICAL MEDICINE & REHABILITATION     PROGRESS NOTE    Subjective/Complaints: Had a reasonable night. Doesn't like food. Wants [employer]! Denies pain.   ROS: Pt denies fever, rash/itching, headache, blurred or double vision, nausea, vomiting, abdominal pain, diarrhea, chest pain, shortness of breath, palpitations, dysuria, dizziness, neck or back pain, bleeding, anxiety, or depression  Objective: Vital Signs: Blood pressure 160/58, pulse 80, temperature 98.3 F (36.8 C), temperature source Oral, resp. rate 18, height 5\' 6"  (1.676 m), weight 108 kg (238 lb 1.6 oz), SpO2 98 %. Ct Head Wo Contrast  12/26/2015  CLINICAL DATA:  Followup intraventricular hemorrhage, history of prior aneurysm clipping EXAM: CT HEAD WITHOUT CONTRAST TECHNIQUE: Contiguous axial images were obtained from the base of the skull through the vertex without intravenous contrast. COMPARISON:  12/23/2015 FINDINGS: Posterior craniotomy is again identified with changes of aneurysm clipping. No other bony abnormality is seen. There is decrease in the amount of intraventricular hemorrhage within the fourth ventricle. Mild dependent blood is noted within the lateral ventricles bilaterally similar to that seen on the prior exam. Slight decrease in the degree of ventricular dilatation is noted as well. No new findings to suggest hemorrhage are seen. No findings to suggest acute infarct are noted. IMPRESSION: Followup scan again demonstrates changes consistent with prior aneurysm clipping. A tiny amount of intraventricular hemorrhage is noted within the lateral ventricles bilaterally. There has been decrease in the dilatation of the lateral and third ventricles when compared the prior exam. The fourth ventricular hemorrhage seen previously has decreased in the interval as well. No new focal hemorrhage is seen. Electronically Signed   By: Kelly Wilson M.D.   On: 12/26/2015 23:08    Recent Labs  12/27/15 0537 12/27/15 1902   WBC 12.2* 11.6*  HGB 13.2 13.7  HCT 39.0 40.2  PLT 310 325    Recent Labs  12/26/15 0240 12/27/15 0537 12/27/15 1902  NA 126* 128*  --   K 4.0 4.0  --   CL 96* 95*  --   GLUCOSE 112* 112*  --   BUN 10 16  --   CREATININE 0.67 0.79 0.95  CALCIUM 8.6* 8.9  --    CBG (last 3)   Recent Labs  12/25/15 2012  GLUCAP 108*    Wt Readings from Last 3 Encounters:  12/27/15 108 kg (238 lb 1.6 oz)  12/16/15 108.863 kg (240 lb)  11/05/15 114.306 kg (252 lb)    Physical Exam:  Constitutional: She appears well-developed. No distress.  obese  HENT:  Head: Normocephalic and atraumatic.  Right Ear: External ear normal.  Eyes: EOM are normal. Pupils are equal, round, and reactive to light.  Neck: Normal range of motion. Neck supple. No JVD present. No tracheal deviation present. No thyromegaly present.  Posterior head incision intact  Cardiovascular: Normal rate and regular rhythm.  Respiratory: Effort normal and breath sounds normal. No respiratory distress.  GI: Soft. Bowel sounds are normal. She exhibits no distension.  Neurological: She is alert and oriented to name, place, month/year. She does have some perseveration.  Fair insight and awareness. No focal CN abnormalities. No sensory deficits. Strength 4/5 bilateral upper ext and 3/5 HF, 4 KE and 4 ADFPF. Coordination fair  Skin: She is not diaphoretic. Surgical incision intact Psychiatric: She has a normal mood and affect. Her behavior is normal.     Assessment/Plan: 1. Impaired functional mobility and self-care secondary to IVH/hydrocephalus which require 3+ hours per day of interdisciplinary therapy  in a comprehensive inpatient rehab setting. Physiatrist is providing close team supervision and 24 hour management of active medical problems listed below. Physiatrist and rehab team continue to assess barriers to discharge/monitor patient progress toward functional and medical goals.  Function:  Bathing Bathing position       Bathing parts      Bathing assist        Upper Body Dressing/Undressing Upper body dressing                    Upper body assist        Lower Body Dressing/Undressing Lower body dressing                                  Lower body assist        Toileting Toileting          Toileting assist     Transfers Chair/bed transfer             Locomotion Ambulation           Wheelchair          Cognition Comprehension Comprehension assist level: Understands basic 75 - 89% of the time/ requires cueing 10 - 24% of the time  Expression Expression assist level: Expresses basic 75 - 89% of the time/requires cueing 10 - 24% of the time. Needs helper to occlude trach/needs to repeat words.  Social Interaction Social Interaction assist level: Interacts appropriately 75 - 89% of the time - Needs redirection for appropriate language or to initiate interaction.  Problem Solving Problem solving assist level: Solves basic 50 - 74% of the time/requires cueing 25 - 49% of the time  Memory Memory assist level: Recognizes or recalls 50 - 74% of the time/requires cueing 25 - 49% of the time   Medical Problem List and Plan: 1. 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 DVT Prophylaxis/Anticoagulation: Subcutaneous Lovenox initiated 12/22/2015. Monitor for any bleeding episodes 3. Pain Management: Neurontin 900 mg every 8 hours. 4. Hypertension. Maxide daily,Nimotop 60 mg every 4 hours 21 days initiated 12/17/2015 5. Neuropsych: This patient is capable of making decisions on her own behalf. 6. Skin/Wound Care: Routine skin checks 7. Fluids/Electrolytes/Nutrition:  . Encourage intake. Check labs 8. Seizure prophylaxis. Keppra 500 mg twice a day, valproic acid 500 mg daily. EEG negative 9. Hyponatremia/SIADH. Follow-up  chemistries/fluid restriction/sodium chloride tablets  -recheck sodium tomorrow (128 most recently) LOS (Days) 1 A FACE TO FACE EVALUATION WAS PERFORMED  Wilson,Kelly T 12/28/2015 10:12 AM

## 2015-12-29 DIAGNOSIS — E222 Syndrome of inappropriate secretion of antidiuretic hormone: Secondary | ICD-10-CM

## 2015-12-29 LAB — COMPREHENSIVE METABOLIC PANEL
ALK PHOS: 46 U/L (ref 38–126)
ALT: 16 U/L (ref 14–54)
AST: 12 U/L — AB (ref 15–41)
Albumin: 2.7 g/dL — ABNORMAL LOW (ref 3.5–5.0)
Anion gap: 9 (ref 5–15)
BILIRUBIN TOTAL: 0.3 mg/dL (ref 0.3–1.2)
BUN: 9 mg/dL (ref 6–20)
CALCIUM: 8.7 mg/dL — AB (ref 8.9–10.3)
CHLORIDE: 96 mmol/L — AB (ref 101–111)
CO2: 26 mmol/L (ref 22–32)
CREATININE: 0.7 mg/dL (ref 0.44–1.00)
GFR calc Af Amer: >60 mL/min (ref >60)
Glucose, Bld: 116 mg/dL — ABNORMAL HIGH (ref 65–99)
Potassium: 3.5 mmol/L (ref 3.5–5.1)
Sodium: 131 mmol/L — ABNORMAL LOW (ref 135–145)
Total Protein: 5.7 g/dL — ABNORMAL LOW (ref 6.5–8.1)

## 2015-12-29 LAB — CBC WITH DIFFERENTIAL/PLATELET
Basophils Absolute: 0 K/uL (ref 0.0–0.1)
Basophils Relative: 0 %
Eosinophils Absolute: 0.1 K/uL (ref 0.0–0.7)
Eosinophils Relative: 1 %
HCT: 38.7 % (ref 36.0–46.0)
Hemoglobin: 13.1 g/dL (ref 12.0–15.0)
Lymphocytes Relative: 11 %
Lymphs Abs: 1 K/uL (ref 0.7–4.0)
MCH: 27.5 pg (ref 26.0–34.0)
MCHC: 33.9 g/dL (ref 30.0–36.0)
MCV: 81.3 fL (ref 78.0–100.0)
Monocytes Absolute: 1 K/uL (ref 0.1–1.0)
Monocytes Relative: 11 %
Neutro Abs: 7.4 K/uL (ref 1.7–7.7)
Neutrophils Relative %: 77 %
Platelets: 294 K/uL (ref 150–400)
RBC: 4.76 MIL/uL (ref 3.87–5.11)
RDW: 13.3 % (ref 11.5–15.5)
WBC: 9.5 K/uL (ref 4.0–10.5)

## 2015-12-29 MED ORDER — NYSTATIN 100000 UNIT/GM EX POWD: Freq: Two times a day (BID) | CUTANEOUS | Status: DC

## 2015-12-29 MED ORDER — KCL IN DEXTROSE-NACL 20-5-0.9 MEQ/L-%-% IV SOLN
INTRAVENOUS | Status: DC
  Administered 2015-12-29 – 2015-12-30 (×2): via INTRAVENOUS
  Filled 2015-12-29 (×3): qty 1000

## 2015-12-29 MED ORDER — NYSTATIN 100000 UNIT/GM EX POWD
Freq: Two times a day (BID) | CUTANEOUS | Status: DC
  Administered 2015-12-29 – 2016-01-09 (×23): via TOPICAL
  Administered 2016-01-11: 1 g via TOPICAL
  Administered 2016-01-11 – 2016-01-12 (×2): via TOPICAL
  Administered 2016-01-12: 1 g via TOPICAL
  Administered 2016-01-13 (×2): via TOPICAL
  Filled 2015-12-29 (×2): qty 15

## 2015-12-29 NOTE — Progress Notes (Signed)
Kelly Wilson PHYSICAL MEDICINE & REHABILITATION     PROGRESS NOTE    Subjective/Complaints: Able to sleep last night. Has no appetite. Ate literally nothing yesterday.   ROS: Pt denies fever, rash/itching, headache, blurred or double vision, nausea, vomiting, abdominal pain, diarrhea, chest pain, shortness of breath, palpitations, dysuria, dizziness, neck or back pain, bleeding, anxiety, or depression  Objective: Vital Signs: Blood pressure 160/60, pulse 78, temperature 97.8 F (36.6 C), temperature source Oral, resp. rate 18, height 5\' 6"  (1.676 m), weight 108 kg (238 lb 1.6 oz), SpO2 98 %. No results found.  Recent Labs  12/27/15 1902 12/29/15 0504  WBC 11.6* 9.5  HGB 13.7 13.1  HCT 40.2 38.7  PLT 325 294    Recent Labs  12/27/15 0537 12/27/15 1902 12/29/15 0504  NA 128*  --  131*  K 4.0  --  3.5  CL 95*  --  96*  GLUCOSE 112*  --  116*  BUN 16  --  9  CREATININE 0.79 0.95 0.70  CALCIUM 8.9  --  8.7*   CBG (last 3)  No results for input(s): GLUCAP in the last 72 hours.  Wt Readings from Last 3 Encounters:  12/27/15 108 kg (238 lb 1.6 oz)  12/16/15 108.863 kg (240 lb)  11/05/15 114.306 kg (252 lb)    Physical Exam:  Constitutional: She appears well-developed. No distress.  obese  HENT:  Head: Normocephalic and atraumatic.  Right Ear: External ear normal.  Eyes: EOM are normal. Pupils are equal, round, and reactive to light.  Neck: Normal range of motion. Neck supple. No JVD present. No tracheal deviation present. No thyromegaly present.  Posterior head incision intact  Cardiovascular: Normal rate and regular rhythm.  Respiratory: Effort normal and breath sounds normal. No respiratory distress.  GI: Soft. Bowel sounds are normal. She exhibits no distension.  Neurological: She is alert and oriented to name, place, month/year.   Fair insight and awareness. No focal CN abnormalities. No sensory deficits. Strength 4/5 bilateral upper ext and 3/5 HF, 4 KE and 4  ADFPF. Coordination fair  Skin: She is not diaphoretic. Surgical incision intact. Red/macular rash in inguinal folds Psychiatric: She has a normal mood and affect. Her behavior is normal.     Assessment/Plan: 1. Impaired functional mobility and self-care secondary to IVH/hydrocephalus which require 3+ hours per day of interdisciplinary therapy in a comprehensive inpatient rehab setting. Physiatrist is providing close team supervision and 24 hour management of active medical problems listed below. Physiatrist and rehab team continue to assess barriers to discharge/monitor patient progress toward functional and medical goals.  Function:  Bathing Bathing position   Position: Shower  Bathing parts Body parts bathed by patient: Right arm, Left arm, Chest, Abdomen, Front perineal area, Right upper leg, Left upper leg Body parts bathed by helper: Buttocks, Right lower leg, Left lower leg, Back  Bathing assist Assist Level:  (mod a)      Upper Body Dressing/Undressing Upper body dressing   What is the patient wearing?: Hospital gown                Upper body assist Assist Level: Set up, Supervision or verbal cues   Set up : To obtain clothing/put away  Lower Body Dressing/Undressing Lower body dressing   What is the patient wearing?: [place], Non-skid slipper socks           Non-skid slipper socks- Performed by helper: Don/doff right sock, Don/doff left sock  TED Hose - Performed by helper: Don/doff right TED hose, Don/doff left TED hose  Lower body assist Assist for lower body dressing: Touching or steadying assistance (Pt > 75%)      Toileting Toileting   Toileting steps completed by patient: Adjust clothing prior to toileting Toileting steps completed by helper: Adjust clothing prior to toileting, Performs perineal hygiene, Adjust clothing after toileting Toileting Assistive Devices: Grab bar or rail  Toileting assist Assist level: Two  helpers   Transfers Chair/bed transfer   Chair/bed transfer method: Stand pivot, Ambulatory Chair/bed transfer assist level: Moderate assist (Pt 50 - 74%/lift or lower) Chair/bed transfer assistive device: Walker, Air cabin crew     Max distance: 25 Assist level: Moderate assist (Pt 50 - 74%)   Wheelchair   Type: Manual Max wheelchair distance: 150 Assist Level: Dependent (Pt equals 0%)  Cognition Comprehension Comprehension assist level: Understands basic 75 - 89% of the time/ requires cueing 10 - 24% of the time  Expression Expression assist level: Expresses basic 75 - 89% of the time/requires cueing 10 - 24% of the time. Needs helper to occlude trach/needs to repeat words.  Social Interaction Social Interaction assist level: Interacts appropriately 75 - 89% of the time - Needs redirection for appropriate language or to initiate interaction.  Problem Solving Problem solving assist level: Solves basic 50 - 74% of the time/requires cueing 25 - 49% of the time  Memory Memory assist level: Recognizes or recalls 50 - 74% of the time/requires cueing 25 - 49% of the time   Medical Problem List and Plan: 1. 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 DVT Prophylaxis/Anticoagulation: Subcutaneous Lovenox initiated 12/22/2015. Monitor for any bleeding episodes 3. Pain Management: Neurontin 900 mg every 8 hours. 4. Hypertension. Maxide daily,Nimotop 60 mg every 4 hours 21 days initiated 12/17/2015 5. Neuropsych: This patient is capable of making decisions on her own behalf. 6. Skin/Wound Care: Routine skin checks 7. Fluids/Electrolytes/Nutrition:   Discussed intake with patient today. However her insight is not as such to "push herself to eat more." will add megace to help stimulate appetite  -change IVF to D5NS for now 8. Seizure  prophylaxis. Keppra 500 mg twice a day, valproic acid 500 mg daily. EEG negative 9. Hyponatremia/SIADH. Continue PO fluid restriction/continue sodium chloride tablets for now.  -sodium up to 131 today. I personally reviewed the patient's labs today.   -recheck next week.     LOS (Days) 2 A FACE TO FACE EVALUATION WAS PERFORMED  SWARTZ,ZACHARY T 12/29/2015 9:47 AM

## 2015-12-30 ENCOUNTER — Inpatient Hospital Stay (HOSPITAL_COMMUNITY): Payer: BLUE CROSS/BLUE SHIELD | Admitting: Speech Pathology

## 2015-12-30 ENCOUNTER — Inpatient Hospital Stay (HOSPITAL_COMMUNITY): Payer: BLUE CROSS/BLUE SHIELD | Admitting: Physical Therapy

## 2015-12-30 ENCOUNTER — Inpatient Hospital Stay (HOSPITAL_COMMUNITY): Payer: BLUE CROSS/BLUE SHIELD | Admitting: Occupational Therapy

## 2015-12-30 DIAGNOSIS — E871 Hypo-osmolality and hyponatremia: Secondary | ICD-10-CM | POA: Insufficient documentation

## 2015-12-30 DIAGNOSIS — R51 Headache: Secondary | ICD-10-CM

## 2015-12-30 DIAGNOSIS — R630 Anorexia: Secondary | ICD-10-CM | POA: Insufficient documentation

## 2015-12-30 DIAGNOSIS — R519 Headache, unspecified: Secondary | ICD-10-CM | POA: Insufficient documentation

## 2015-12-30 LAB — BASIC METABOLIC PANEL
Anion gap: 9 (ref 5–15)
BUN: 6 mg/dL (ref 6–20)
CALCIUM: 8.9 mg/dL (ref 8.9–10.3)
CO2: 25 mmol/L (ref 22–32)
CREATININE: 0.58 mg/dL (ref 0.44–1.00)
Chloride: 99 mmol/L — ABNORMAL LOW (ref 101–111)
GLUCOSE: 113 mg/dL — AB (ref 65–99)
Potassium: 3.8 mmol/L (ref 3.5–5.1)
Sodium: 133 mmol/L — ABNORMAL LOW (ref 135–145)

## 2015-12-30 NOTE — Progress Notes (Signed)
Speech Language Pathology Daily Session Note  Patient Details  Name: Kelly Wilson MRN: [id number] Date of Birth: 15-May-1956  Today's Date: 12/30/2015 SLP Individual Time: 1300-1400 SLP Individual Time Calculation (min): 60 min  Short Term Goals: Week 1: SLP Short Term Goal 1 (Week 1): Patient will initiate functional tasks with Min A verbal cues.  SLP Short Term Goal 2 (Week 1): Patient will demonstrate functional problem solving for basic and familiar tasks with Min A verbal cues.  SLP Short Term Goal 3 (Week 1): Patient will utilize external memory aids to recall new, daily information with Min A question and verbal cues.  SLP Short Term Goal 4 (Week 1): Patient will utilize call bell to request assistance in 25% of observable opportunities.  SLP Short Term Goal 5 (Week 1): Patient will self-monitor and correct errors during functional tasks/verbal conversation with Min A question cues.  SLP Short Term Goal 6 (Week 1): Patient will demonstrate sustained attention to a functional task for ~30 minutes with Min A verbal cues for redirection.   Skilled Therapeutic Interventions: Skilled treatment session focused on cognitive goals. SLP facilitated session by providing Max encouragement and initiation for PO intake with a bagel from [employer] in which she ate 50%.  Patient also participated in a basic money management task and required overall Max A multimodal cues for problem solving, recall, organization and overall sustained attention to task. However, patient demonstrated appropriate emergent awareness into difficulty of task. Patient left supine in bed with all needs within reach and alarm on. Continue with current plan of care.    Function:   Cognition Comprehension Comprehension assist level: Understands basic 75 - 89% of the time/ requires cueing 10 - 24% of the time  Expression   Expression assist level: Expresses basic 75 - 89% of the time/requires cueing 10 - 24% of the time. Needs  helper to occlude trach/needs to repeat words.  Social Interaction Social Interaction assist level: Interacts appropriately 75 - 89% of the time - Needs redirection for appropriate language or to initiate interaction.  Problem Solving Problem solving assist level: Solves basic 25 - 49% of the time - needs direction more than half the time to initiate, plan or complete simple activities  Memory Memory assist level: Recognizes or recalls 25 - 49% of the time/requires cueing 50 - 75% of the time    Pain No/Denies Pain   Therapy/Group: Individual Therapy  ,  12/30/2015, 4:02 PM

## 2015-12-30 NOTE — IPOC Note (Signed)
Overall Plan of Care [facility]) Patient Details Name: Kelly Wilson MRN: [id number] DOB: 1955-10-28  Admitting Diagnosis: ICH ADMIT  Hospital Problems: Principal Problem:   IVH (intraventricular hemorrhage) ([place]) Active Problems:   Essential hypertension   Poor appetite   Hyponatremia   Cephalalgia     Functional Problem List: Nursing Behavior, Edema, Medication Management, Pain, Perception, Safety, Sensory, Skin Integrity  PT Balance, Endurance, Motor, Pain, Perception  OT Balance, Cognition, Endurance, Motor, Pain, Safety, Vision  SLP    TR         Basic ADL's: OT Grooming, Bathing, Dressing, Toileting     Advanced  ADL's: OT Simple Meal Preparation     Transfers: PT Bed to Chair, Bed Mobility, Car, Manufacturing systems engineer, Metallurgist: PT Ambulation, Emergency planning/management officer, Stairs     Additional Impairments: OT None  SLP Social Cognition   Social Interaction, Problem Solving, Memory, Attention, Awareness  TR      Anticipated Outcomes Item Anticipated Outcome  Self Feeding n/a  Swallowing      Basic self-care  supervision overall  Toileting  supervision overall   Bathroom Transfers supervision overall  Bowel/Bladder  Min assist  Transfers  Supervision  Locomotion  Supervision  Communication     Cognition  Supervision   Pain  < 3 on a 0-10 scale  Safety/Judgment  min assist    Therapy Plan: PT Intensity: Minimum of 1-2 x/day ,45 to 90 minutes PT Frequency: 5 out of 7 days PT Duration Estimated Length of Stay: 2-2.5 weeks OT Intensity: Minimum of 1-2 x/day, 45 to 90 minutes OT Frequency: 5 out of 7 days OT Duration/Estimated Length of Stay: 12-14 days SLP Intensity: Minumum of 1-2 x/day, 30 to 90 minutes SLP Frequency: 3 to 5 out of 7 days SLP Duration/Estimated Length of Stay: 12-14 days        Team Interventions: Nursing Interventions Patient/Family Education, Disease Management/Prevention, Pain Management, Medication  Management, Skin Care/Wound Management, Discharge Planning, Psychosocial Support  PT interventions Ambulation/gait training, Training and development officer, Cognitive remediation/compensation, Community reintegration, Discharge planning, Disease management/prevention, DME/adaptive equipment instruction, Functional mobility training, Neuromuscular re-education, Pain management, Patient/family education, Psychosocial support, Stair training, Therapeutic Activities, Therapeutic Exercise, UE/LE Strength taining/ROM, UE/LE Coordination activities, Visual/perceptual remediation/compensation, Wheelchair propulsion/positioning  OT Interventions Training and development officer, Engineer, drilling, Patient/family education, Therapeutic Activities, Cognitive remediation/compensation, Psychosocial support, Therapeutic Exercise, UE/LE Strength taining/ROM, Self Care/advanced ADL retraining, Functional mobility training, Community reintegration, Discharge planning, UE/LE Coordination activities, Pain management  SLP Interventions Cognitive remediation/compensation, English as a second language teacher, Functional tasks, Patient/family education, Therapeutic Activities, Internal/external aids, Environmental controls  TR Interventions    SW/CM Interventions Discharge Planning, Psychosocial Support, Patient/Family Education    Team Discharge Planning: Destination: PT-Home ,OT- Home , SLP-Home Projected Follow-up: PT-Home health PT, OT-  24 hour supervision/assistance, SLP-Outpatient SLP, Home Health SLP, 24 hour supervision/assistance Projected Equipment Needs: PT-None recommended by PT, OT- To be determined, SLP-None recommended by SLP Equipment Details: PT- , OT-  Patient/family involved in discharge planning: PT- Patient, Family member/caregiver,  OT-Patient, SLP-Patient, Family member/caregiver  MD ELOS: 12-16 days. Medical Rehab Prognosis:  Good Assessment:  60 y.o. right handed female with history of hypertension, TIA.  Presented 12/16/2015 with acute onset of headache nausea as well as vertigo. Per chart review patient married independent prior to admission. CT of the head showed acute hemorrhage involving basilar cisterns, third and fourth ventricles and extending into lateral ventricles. Mass effect on brainstem was noted with early hydrocephalus. CT angiogram of the head showed  a 3 x 5 mm aneurysm distal right PICA felt to be cause of hemorrhage into the fourth ventricle. 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 Kathyrn Sheriff. Placed on Keppra/Valproic acid for seizure prophylaxis. EEG06/07/2015 suggestive of moderate encephalopathy secondary to generalized cerebral dysfunction. No seizure activity. . Decadron protocol as indicated. Close monitoring of blood pressure with Cardene drip as well as Nimotop for a total of 21 days. On the morning of 12/23/2015 patient more lethargic and became obtunded. Rapid response consulted. Patient was transferred to the ICU. Follow-up CT of the head showed interval development of hydrocephalus advised to monitor per Dr. Kathyrn Sheriff. Patient's mental status continued to improve and therapies again reinitiated she was later moved from the unit 12/24/2015 to medical floorAnd again CT of the head completed 12/26/2015 showing decrease in the dilatation of the lateral and third ventricles when compared to prior exam. The fourth ventricular hemorrhage seen previously have decreased in the interval as well.. Findings of mild hyponatremia and urine osmolality 478 suspect SIADH placed on fluid restriction as well as sodium chloride tablets. Pt with resulting functional deficits in mobility and balance.  Will set goals for supervision with PT/OT.   See Team Conference Notes for weekly updates to the plan of care

## 2015-12-30 NOTE — Progress Notes (Signed)
Patient information reviewed and entered into eRehab system by  , RN, CRRN, PPS Coordinator.  Information including medical coding and functional independence measure will be reviewed and updated through discharge.    

## 2015-12-30 NOTE — Care Management Note (Signed)
[facility] Individual Statement of Services  Patient Name:  Kelly Wilson  Date:  12/30/2015  Welcome to the [facility].  Our goal is to provide you with an individualized program based on your diagnosis and situation, designed to meet your specific needs.  With this comprehensive rehabilitation program, you will be expected to participate in at least 3 hours of rehabilitation therapies Monday-Friday, with modified therapy programming on the weekends.  Your rehabilitation program will include the following services:  Physical Therapy (PT), Occupational Therapy (OT), Speech Therapy (ST), 24 hour per day rehabilitation nursing, Therapeutic Recreaction (TR), Neuropsychology, Case Management (Social Worker), Rehabilitation Medicine, Nutrition Services and Pharmacy Services  Weekly team conferences will be held on Wednesdays to discuss your progress.  Your Social Worker will talk with you frequently to get your input and to update you on team discussions.  Team conferences with you and your family in attendance may also be held.  Expected length of stay: 2-21/2 weeks  Overall anticipated outcome: supervision  Depending on your progress and recovery, your program may change. Your Social Worker will coordinate services and will keep you informed of any changes. Your Social Worker's name and contact numbers are listed  below.  The following services may also be recommended but are not provided by the [facility] [place] will be made to provide these services after discharge if needed.  Arrangements include referral to agencies that provide these services.  Your insurance has been verified to be:  [place] Your primary doctor is:  [name]  Pertinent information will be shared with your doctor and your  insurance company.  Social Worker:  [place], [place] or ([phone]   Information discussed with and copy given to patient by: Lennart Pall, 12/30/2015, 2:46 PM

## 2015-12-30 NOTE — Progress Notes (Signed)
Speech Language Pathology Daily Session Note  Patient Details  Name: Kelly Wilson MRN: [id number] Date of Birth: May 05, 1956  Today's Date: 12/30/2015 SLP Individual Time: 0931-1000 SLP Individual Time Calculation (min): 29 min  Short Term Goals: Week 1: SLP Short Term Goal 1 (Week 1): Patient will initiate functional tasks with Min A verbal cues.  SLP Short Term Goal 2 (Week 1): Patient will demonstrate functional problem solving for basic and familiar tasks with Min A verbal cues.  SLP Short Term Goal 3 (Week 1): Patient will utilize external memory aids to recall new, daily information with Min A question and verbal cues.  SLP Short Term Goal 4 (Week 1): Patient will utilize call bell to request assistance in 25% of observable opportunities.  SLP Short Term Goal 5 (Week 1): Patient will self-monitor and correct errors during functional tasks/verbal conversation with Min A question cues.  SLP Short Term Goal 6 (Week 1): Patient will demonstrate sustained attention to a functional task for ~30 minutes with Min A verbal cues for redirection.   Skilled Therapeutic Interventions: Skilled treatment session focused on addressing cognition goals. SLP facilitated session by providing Max faded to Mod assist verbal and visual cues to locate and utilize external aids.  Patient required cues to locate specific information on daily schedule which was used as a means to facilitate recall of past and upcoming therapy sessions/events.  Despite, cues patient unable to recall earlier events in day.  Patient required Max assist multimodal cues to accurately utilize call bell upon request.  Continue with current plan of care.    Function:  Cognition Comprehension Comprehension assist level: Understands basic 75 - 89% of the time/ requires cueing 10 - 24% of the time  Expression   Expression assist level: Expresses basic 75 - 89% of the time/requires cueing 10 - 24% of the time. Needs helper to occlude  trach/needs to repeat words.  Social Interaction Social Interaction assist level: Interacts appropriately 75 - 89% of the time - Needs redirection for appropriate language or to initiate interaction.  Problem Solving Problem solving assist level: Solves basic 25 - 49% of the time - needs direction more than half the time to initiate, plan or complete simple activities  Memory Memory assist level: Recognizes or recalls 25 - 49% of the time/requires cueing 50 - 75% of the time    Pain Pain Assessment Pain Assessment: No/denies pain  Therapy/Group: Individual Therapy  Carmelia Roller., CCC-SLP [id number]  [place] 12/30/2015, 9:59 AM

## 2015-12-30 NOTE — Progress Notes (Signed)
Occupational Therapy Session Note  Patient Details  Name: Kelly Wilson MRN: [id number] Date of Birth: [birth year]  Today's Date: 12/30/2015 OT Individual Time: 0817-0900 OT Individual Time Calculation (min): 43 min    Short Term Goals: Week 1:  OT Short Term Goal 1 (Week 1): Pt will perform toilet transfer with min A in order to increase I with functional transfers. OT Short Term Goal 2 (Week 1): Pt will perform clothing management during toileting with steady assist for balance. OT Short Term Goal 3 (Week 1): Pt will perform UB dressing with min A in order to increase I with functional tasks.  OT Short Term Goal 4 (Week 1): Pt will perform toilet transfer with min A in order to increae I with functional transfers.   Skilled Therapeutic Interventions/Progress Updates:  Upon entering the room, pt supine in bed with husband present in the room. Pt with c/o 10/10 pain described as severe headache. Pt reports RN recently gave medications. Pt agreeable to OT intervention with coaxing. Pt performed supine >sit with min A for trunk. Pt verbalizing urgency to toileting. Pt performed stand pivot transfer onto [facility] with use of RW and mod A for lifting. While sitting on toilet, pt engaged in bathing and dressing tasks with set up and min A for safety. Pt standing for clothing management with assistance needed to pull over B hips with pt alternating hands on RW for balance with task and mod multmodal cues throughout session. Pt seated in wheelchair for grooming tasks at sink with supervision and min verbal cues for perseveration and initiation. Quick release belt donned and pt remained in wheelchair at end of session with call bell and all needed items within reach upon exiting the room.   Therapy Documentation Precautions:  Precautions Precautions: Fall Restrictions Weight Bearing Restrictions: No   Pain: Pain Assessment Faces Pain Scale: Hurts worst Pain Location: Head Pain Descriptors /  Indicators: Headache Pain Intervention(s): Medication (See eMAR)  See Function Navigator for Current Functional Status.   Therapy/Group: Individual Therapy  Phineas Semen 12/30/2015, 9:35 AM

## 2015-12-30 NOTE — Progress Notes (Signed)
Physical Therapy Session Note  Patient Details  Name: Kelly Wilson MRN: [id number] Date of Birth: [birth year]  Today's Date: 12/30/2015 PT Individual Time: 1005-1105 PT Individual Time Calculation (min): 60 min   Short Term Goals: Week 1:  PT Short Term Goal 1 (Week 1): Pt will perform bed mobility on flat bed with min A and min cues for initiation/sequencing PT Short Term Goal 2 (Week 1): Pt will perform stand pivot transfers with LRAD and min A and min cues for initiation/sequencing PT Short Term Goal 3 (Week 1): Pt will perform w/c mobility x 150' for endurance training with min A and 2 rest breaks PT Short Term Goal 4 (Week 1): Pt will ambulate 30' with LRAD with improved gait speed and min A and min cues for safety, sequencing and initiation PT Short Term Goal 5 (Week 1): Pt will negotiate 4 stairs with 2 rails and min A  Skilled Therapeutic Interventions/Progress Updates:    Pt received resting in w/c with no c/o pain and agreeable to therapy session.  Session focus on patient education, NMR, activity tolerance, functional mobility, and trunk/postural control.    Pt propelled w/c with BUEs to therapy gym with supervision and mod verbal cues to attend to left side.  Stand/pivot to therapy mat with RW and min assist with mod multimodal cues for pivot safely.  Pt demos ambulatory transfer with RW and min/mod assist for approaching w/c and pivoting.  Squat/pivot w/c>bed at end of session with steady assist and min verbal cues for sequencing.    Pt engaged in dynamic sitting balance activity focus on attention to L side, reaching outside BOS, and returning to midline while playing card matching game.  Pt able to match with 100% accuracy with supervision.  PT providing facilitation for reaching and maintaining LLE in weight bearing position and pt tends to slide her L foot back underneath the mat.  Sit<>stand focus on attaining and maintaining midline.  Pt requires min/mod assist to maintain  midline orientation without RW for support.  In sitting on mat, pt engaged in lateral leans initially with UEs extended focus on push through UEs and forward weight shift to return to midline, progress to lateral leans to elbows focus on forward weight shift to return to midline.  Pt reporting increased HA pain with lateral leans so activity terminated.    Gait training x100' with RW and 2 turns with min/mod assist with mod multimodal cues for step length, walker positioning, and attention to L.  PT provided pt education regarding effects of IVH in regards to L inattention and postural control.  Pt verbalized understanding but education to continue.    Pt returned to room at end of session in w/c using BLEs to propel for NMR.  Focus on path finding to return to room.  Pt positioned supine in bed with steady assist and left with bed alarm activated, call bell in reach and needs met.   Therapy Documentation Precautions:  Precautions Precautions: Fall Restrictions Weight Bearing Restrictions: No   See Function Navigator for Current Functional Status.   Therapy/Group: Individual Therapy  Earnest Conroy Penven-Crew 12/30/2015, 12:08 PM

## 2015-12-30 NOTE — Progress Notes (Signed)
[place] PHYSICAL MEDICINE & REHABILITATION     PROGRESS NOTE    Subjective/Complaints: Pt laying in bed this AM.  She and her husband are upset because her call bell is not working.  She also complains of a headache.  ROS: Denies CP, COB, N/V/D.  Objective: Vital Signs: Blood pressure 143/73, pulse 62, temperature 98.6 F (37 C), temperature source Oral, resp. rate 16, height 5\' 6"  (1.676 m), weight 108 kg (238 lb 1.6 oz), SpO2 96 %. No results found.  Recent Labs  12/27/15 1902 12/29/15 0504  WBC 11.6* 9.5  HGB 13.7 13.1  HCT 40.2 38.7  PLT 325 294    Recent Labs  12/29/15 0504 12/30/15 0605  NA 131* 133*  K 3.5 3.8  CL 96* 99*  GLUCOSE 116* 113*  BUN 9 6  CREATININE 0.70 0.58  CALCIUM 8.7* 8.9   CBG (last 3)  No results for input(s): GLUCAP in the last 72 hours.  Wt Readings from Last 3 Encounters:  12/27/15 108 kg (238 lb 1.6 oz)  12/16/15 108.863 kg (240 lb)  11/05/15 114.306 kg (252 lb)    Physical Exam:  Constitutional: She appears well-developed. No distress. Obese  HENT: Normocephalic and atraumatic.  Eyes: EOM and Conj are normal.  Neck: Normal range of motion. Neck supple. No JVD present. No tracheal deviation present. No thyromegaly present.  Posterior head incision intact  Cardiovascular: Normal rate and regular rhythm.  Respiratory: Effort normal and breath sounds normal. No respiratory distress.  GI: Soft. Bowel sounds are normal. She exhibits no distension.  Neurological: She is alert and oriented.    Fair insight and awareness.  Strength 4+/5 bilateral upper ext and 4/5 HF, 4+/5 KE and 5/5 ADFPF.  Skin: She is not diaphoretic. Surgical incision intact. Red/macular rash in inguinal folds Psychiatric: She has a normal mood and affect. Her behavior is normal.     Assessment/Plan: 1. 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 Physiatrist is providing close team supervision and 24 hour management of active medical problems listed below. Physiatrist and rehab team continue to assess barriers to discharge/monitor patient progress toward functional and medical goals.  Function:  Bathing Bathing position   Position: Other (comment) (seated on commode)  Bathing parts Body parts bathed by patient: Right arm, Left arm, Chest, Abdomen, Front perineal area, Right upper leg, Left upper leg, Right lower leg, Left lower leg Body parts bathed by helper: Back, Buttocks  Bathing assist Assist Level: Touching or steadying assistance(Pt > 75%)      Upper Body Dressing/Undressing Upper body dressing   What is the patient wearing?: Hospital gown     Pull over shirt/dress - Perfomed by patient: Thread/unthread right sleeve, Thread/unthread left sleeve, Put head through opening, Pull shirt over trunk          Upper body assist Assist Level: Set up   Set up : To obtain clothing/put away  Lower Body Dressing/Undressing Lower body dressing   What is the patient wearing?: Ted Hose, Pants, Shoes     Pants- Performed by patient: Thread/unthread right pants leg, Thread/unthread left pants leg Pants- Performed by helper: Pull pants up/down   Non-skid slipper socks- Performed by helper: Don/doff right sock, Don/doff left sock     Shoes - Performed by patient: Don/doff right shoe, Don/doff left shoe, Fasten right, Fasten left         TED  Hose - Performed by helper: Don/doff right TED hose, Don/doff left TED hose  Lower body assist Assist for lower body dressing: Touching or steadying assistance (Pt > 75%)      Toileting Toileting   Toileting steps completed by patient: Adjust clothing prior to toileting, Performs perineal hygiene Toileting steps completed by helper: Adjust clothing after toileting Toileting Assistive Devices: Grab bar or rail  Toileting assist Assist level: Touching or  steadying assistance (Pt.75%)   Transfers Chair/bed transfer   Chair/bed transfer method: Stand pivot, Ambulatory Chair/bed transfer assist level: Moderate assist (Pt 50 - 74%/lift or lower) Chair/bed transfer assistive device: Walker, Air cabin crew     Max distance: 25 Assist level: Moderate assist (Pt 50 - 74%)   Wheelchair   Type: Manual Max wheelchair distance: 150 Assist Level: Dependent (Pt equals 0%)  Cognition Comprehension Comprehension assist level: Understands basic 75 - 89% of the time/ requires cueing 10 - 24% of the time  Expression Expression assist level: Expresses basic 75 - 89% of the time/requires cueing 10 - 24% of the time. Needs helper to occlude trach/needs to repeat words.  Social Interaction Social Interaction assist level: Interacts appropriately 75 - 89% of the time - Needs redirection for appropriate language or to initiate interaction.  Problem Solving Problem solving assist level: Solves basic 50 - 74% of the time/requires cueing 25 - 49% of the time  Memory Memory assist level: Recognizes or recalls 50 - 74% of the time/requires cueing 25 - 49% of the time   Medical Problem List and Plan: 1. 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 DVT Prophylaxis/Anticoagulation: Subcutaneous Lovenox initiated 12/22/2015. Monitor for any bleeding episodes 3. Pain Management: Neurontin 900 mg every 8 hours.   Wll consider scheduling for perisistent headaches  Cont PRNs 4. Hypertension. Maxide daily,Nimotop 60 mg every 4 hours 21 days initiated 12/17/2015  Will cont to monitor 5. Neuropsych: This patient is capable of making decisions on her own behalf. 6. Skin/Wound Care: Routine skin checks 7. Fluids/Electrolytes/Nutrition:  Discussed intake with patient   Added megace to help stimulate appetite  IVF d/ced  on 6/12  Ate 35% of her meals yesterday 8. Seizure prophylaxis. Keppra 500 mg twice a day, valproic acid 500 mg daily. EEG negative 9. Hyponatremia.   heContinue sodium chloride tablets for now.  Na 133 on 6/12 (improving)  LOS (Days) 3 A FACE TO FACE EVALUATION WAS PERFORMED  Ankit Lorie Phenix 12/30/2015 9:49 AM

## 2015-12-30 NOTE — Progress Notes (Signed)
Social Work  Social Work Assessment and Plan  Patient Details  Name: Kelly Wilson MRN: [id number] Date of Birth: Aug 08, 1955  Today's Date: 12/30/2015  Problem List:  Patient Active Problem List   Diagnosis Date Noted  . Hypokalemia   . Heat rash   . Poor appetite   . Hyponatremia   . Cephalalgia   . Obstructive hydrocephalus 12/17/2015  . 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[place]) 12/17/2015  . Headache 12/17/2015  . IVH (intraventricular hemorrhage) ([place]) 12/16/2015  . Chest pain 05/10/2015  . Precordial chest pain 05/10/2015  . Chest wall pain 05/10/2015  . Pain in the chest   . Wellness examination 01/24/2015  . Myalgia 11/14/2014  . Muscle cramps 11/14/2014  . Pinched nerve 08/28/2014  . Routine general medical examination at a health care facility 08/12/2013  . Cough 04/21/2012  . Lymphadenitis, acute 10/23/2011  . GERD 08/09/2008  . OTHER DYSPHAGIA 06/18/2008  . 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 NECK DISORDER 05/22/2008  . HEADACHE 08/16/2007  . OTITIS MEDIA, ACUTE, LEFT 08/11/2007  . OTHER ABNORMAL BLOOD CHEMISTRY 07/25/2007  . HYPERLIPIDEMIA 05/17/2007  . Essential hypertension 05/17/2007  . OSTEOARTHRITIS 05/17/2007  . SPINAL STENOSIS 05/17/2007  . History of cardiovascular disorder 05/17/2007   Past Medical History:  Past Medical History  Diagnosis Date  . Hyperlipidemia   . Hypertension   . 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 HYPERLIPIDEMIA 05/17/2007  . HYPERTENSION 05/17/2007  . GERD 08/09/2008  . OSTEOARTHRITIS 05/17/2007  . SPINAL STENOSIS 05/17/2007  . TRANSIENT ISCHEMIC ATTACK, HX OF 05/17/2007  . Left ear hearing loss     Mild   Past Surgical History:  Past Surgical History  Procedure Laterality Date  . Abdominal hysterectomy  1995  . Tubal ligation    . External ear surgery      left side x's 2  . Stapedectomy    . Throidectomy    . Orif elbow fracture  02/2011  . Skin graft full thickness arm    . Skin graft full thickness  leg    . Cesarean section  1993  . Ankle surgery  2005  . Thyroidectomy, partial Right 1980  . Knee surgery  02/2011  . Craniotomy Right 12/17/2015    Procedure: Suboccipital CRANIectomy INTRACRANIAL ANEURYSM FOR VERTEBRAL/BASILAR;  Surgeon: Consuella Lose, MD;  Location: [place] [facility];  Service: Neurosurgery;  Laterality: Right;  suboccipital   Social History:  reports that she has never smoked. She does not have any smokeless tobacco history on file. She reports that she does not drink alcohol or use illicit drugs.  Family / Support Systems Marital Status: Married How Long?: 56 yrs Patient Roles: Spouse, Parent, Other (Comment) (p/t employee) Spouse/Significant Other: Kelly Wilson @ (C) [phone] Children: daughters, Kelly Wilson [phone]) and Kelly Wilson [phone]) @ (C) [phone] - both local as of July (daughter, Kelly Wilson, to return home from [place]) Anticipated Caregiver: spouse and daughter Caregiver Availability: 24/7 Family Dynamics: Daughter approached me following interview and reports that she is best contact person as father is very [place].  She also reports that pt has been very "mean" to spouse which is not her typical demeanor.    Social History Preferred language: English Religion: Baptist Cultural Background: NA Education: HS Read: Yes Write: Yes Employment Status: Employed Name of Employer: p/t Solic[employer] Return to Work Plans: TBD Freight forwarder Issues: None Guardian/Conservator: None - per MD, pt capable of making decisions on her own behalf   Abuse/Neglect Physical Abuse: Denies Verbal Abuse: Denies  Sexual Abuse: Denies Exploitation of patient/patient's resources: Denies Self-Neglect: Denies  Emotional Status Pt's affect, behavior adn adjustment status: Attempted x 2 to complete assessment interview with pt but only able to do so when daughter was present.  Pt lying in bed with very flat, angry affect.  Offers only brief answers and only if encouraged to  do so.  She appears willing to allow daughter to provide information for her.  When questioned about mood/ emotional adjustment, she states very sarcastically that she is "doing great".  Again, cannot engage pt any further and daughter later expresses much concern about pt's mood. Recent Psychosocial Issues: None Pyschiatric History: None Substance Abuse History: None  Patient / Family Perceptions, Expectations & Goals Pt/Family understanding of illness & functional limitations: Pt reports, "I had a stroke" and later reports "I had a seizure".  Daughter with basic understanding of pt's aneurysm rupture, surgery and current functional deficits/ need for CIR. Premorbid pt/family roles/activities: Completely independent Anticipated changes in roles/activities/participation: Husband and daughter to assume caregiver roles  Recruitment consultant: None Premorbid Home Care/DME Agencies: None Transportation available at discharge: yes Resource referrals recommended: Neuropsychology  Discharge Planning Living Arrangements: Spouse/significant other, Children Support Systems: Spouse/significant other, Children, Other relatives, Water engineer, Social worker community Type of Residence: Private residence Insurance Resources: Multimedia programmer (specify) Nurse, mental health) Financial Resources: Employment Museum/gallery curator Screen Referred: No Living Expenses: Higher education careers adviser Management: Patient Does the patient have any problems obtaining your medications?: No Home Management: pt and spouse Patient/Family Preliminary Plans: Pt to dc home with family to provide assistance Social Work Anticipated Follow Up Needs: HH/OP Expected length of stay: 2-2.5 weeks  Clinical Impression Very unfortunate woman here following an aneurysm rupture with hemorrhage.  Very flat, angry affect and difficult to engage in interview process.  Daughter at bedside and assists with information.  Daughter reports that she will work  together with her father to provide 24/7 assistance but also reports that pt's has been very aggressive towards her father which is not her customary demeanor.  Will follow for support and d/c planning needs.  ,  12/30/2015, 2:45 PM

## 2015-12-31 ENCOUNTER — Inpatient Hospital Stay (HOSPITAL_COMMUNITY): Payer: BLUE CROSS/BLUE SHIELD | Admitting: Physical Therapy

## 2015-12-31 ENCOUNTER — Inpatient Hospital Stay (HOSPITAL_COMMUNITY): Payer: BLUE CROSS/BLUE SHIELD | Admitting: Occupational Therapy

## 2015-12-31 ENCOUNTER — Inpatient Hospital Stay (HOSPITAL_COMMUNITY): Payer: BLUE CROSS/BLUE SHIELD | Admitting: Speech Pathology

## 2015-12-31 DIAGNOSIS — I619 Nontraumatic intracerebral hemorrhage, unspecified: Secondary | ICD-10-CM

## 2015-12-31 DIAGNOSIS — E876 Hypokalemia: Secondary | ICD-10-CM | POA: Insufficient documentation

## 2015-12-31 DIAGNOSIS — L740 Miliaria rubra: Secondary | ICD-10-CM | POA: Insufficient documentation

## 2015-12-31 LAB — BASIC METABOLIC PANEL
Anion gap: 11 (ref 5–15)
BUN: 8 mg/dL (ref 6–20)
CHLORIDE: 99 mmol/L — AB (ref 101–111)
CO2: 23 mmol/L (ref 22–32)
Calcium: 9.2 mg/dL (ref 8.9–10.3)
Creatinine, Ser: 0.67 mg/dL (ref 0.44–1.00)
GFR calc Af Amer: >60 mL/min (ref >60)
GFR calc non Af Amer: >60 mL/min (ref >60)
GLUCOSE: 101 mg/dL — AB (ref 65–99)
POTASSIUM: 3.4 mmol/L — AB (ref 3.5–5.1)
SODIUM: 133 mmol/L — AB (ref 135–145)

## 2015-12-31 MED ORDER — POTASSIUM CHLORIDE CRYS ER 20 MEQ PO TBCR
40.0000 meq | EXTENDED_RELEASE_TABLET | Freq: Once | ORAL | Status: AC
Start: 2015-12-31 — End: 2015-12-31
  Administered 2015-12-31: 40 meq via ORAL
  Filled 2015-12-31: qty 2

## 2015-12-31 NOTE — Progress Notes (Signed)
Physical Therapy Session Note  Patient Details  Name: Kelly Wilson MRN: [id number] Date of Birth: May 23, 1956  Today's Date: 12/31/2015 PT Individual Time: 1300-1345 PT Individual Time Calculation (min): 45 min   Short Term Goals: Week 1:  PT Short Term Goal 1 (Week 1): Pt will perform bed mobility on flat bed with min A and min cues for initiation/sequencing PT Short Term Goal 2 (Week 1): Pt will perform stand pivot transfers with LRAD and min A and min cues for initiation/sequencing PT Short Term Goal 3 (Week 1): Pt will perform w/c mobility x 150' for endurance training with min A and 2 rest breaks PT Short Term Goal 4 (Week 1): Pt will ambulate 80' with LRAD with improved gait speed and min A and min cues for safety, sequencing and initiation PT Short Term Goal 5 (Week 1): Pt will negotiate 4 stairs with 2 rails and min A  Skilled Therapeutic Interventions/Progress Updates:    Pt received resting in w/c, no c/o pain, and agreeable to therapy session.  RN present in room and states that pt was reporting not feeling well but all vitals WNL and pt cleared to participate.  Session focus on endurance, attention to task, NMR, orientation and recall, and transfers.    Pt performs squat/pivot from w/c<>nustep with steady assist and min verbal cues for stepping.  Nustep x11 minutes at level 3 for overall endurance, reciprocal stepping pattern, and attention to task.  Pt instructed to stop at 10 minutes, but required verbal cues to attend to timer throughout.    Pt taken to day room and engaged in task to facilitate orientation and daily recall.  PT provided pt with packet of paper and pt able to recall the month and year but not the date or day of the week.  PT provided max multimodal cues for pt to recall previous therapy sessions and tasks completed in each.  PT encouraged pt to refer to packet to assist with daily recall.  Pt verbalized understanding but education to continue.    Pt returned  to room in w/c and ate 50% of her lunch with supervision.  PT provided pt education regarding healing and recovery process for brain injury and importance of getting enough nutrition for brain fuel.  Pt verbalized understanding.   Pt left upright in w/c with call bell in reach, QRB in place, and needs met.   Therapy Documentation Precautions:  Precautions Precautions: Fall Restrictions Weight Bearing Restrictions: No   See Function Navigator for Current Functional Status.   Therapy/Group: Individual Therapy  Earnest Conroy Penven-Crew 12/31/2015, 1:56 PM

## 2015-12-31 NOTE — Progress Notes (Signed)
Speech Language Pathology Daily Session Note  Patient Details  Name: Kelly Wilson MRN: [id number] Date of Birth: 10/11/1955  Today's Date: 12/31/2015 SLP Individual Time: 0800-0900 SLP Individual Time Calculation (min): 60 min  Short Term Goals: Week 1: SLP Short Term Goal 1 (Week 1): Patient will initiate functional tasks with Min A verbal cues.  SLP Short Term Goal 2 (Week 1): Patient will demonstrate functional problem solving for basic and familiar tasks with Min A verbal cues.  SLP Short Term Goal 3 (Week 1): Patient will utilize external memory aids to recall new, daily information with Min A question and verbal cues.  SLP Short Term Goal 4 (Week 1): Patient will utilize call bell to request assistance in 25% of observable opportunities.  SLP Short Term Goal 5 (Week 1): Patient will self-monitor and correct errors during functional tasks/verbal conversation with Min A question cues.  SLP Short Term Goal 6 (Week 1): Patient will demonstrate sustained attention to a functional task for ~30 minutes with Min A verbal cues for redirection.   Skilled Therapeutic Interventions: Skilled treatment session focused on cognitive goals. Upon arrival, patient was sitting upright in the wheelchair. SLP facilitated session by providing Max A verbal cues for initiation with self-feeding and Max encouragement for PO intake. Patient was independently oriented to place and situation and required Mod A visual cues for orientation to date. SLP also facilitated session by providing Max A verbal and visual cues for utilization of external of schedule to anticipate upcoming therapy sessions. Patient required total A for intellectual awareness of deficits and reporting "I just want to go" throughout the session. Patient with flat affect with limited engagement. Reported 10/10 headache. RN made aware and administered medications. Patient left upright in wheelchair with quick release belt in place and all needs  within reach. Continue with current plan of care.    Function:  Eating Eating   Modified Consistency Diet: No Eating Assist Level: Set up assist for   Eating Set Up Assist For: Opening containers       Cognition Comprehension Comprehension assist level: Understands basic 75 - 89% of the time/ requires cueing 10 - 24% of the time  Expression   Expression assist level: Expresses basic 50 - 74% of the time/requires cueing 25 - 49% of the time. Needs to repeat parts of sentences.  Social Interaction Social Interaction assist level: Interacts appropriately 50 - 74% of the time - May be physically or verbally inappropriate.  Problem Solving Problem solving assist level: Solves basic 50 - 74% of the time/requires cueing 25 - 49% of the time  Memory Memory assist level: Recognizes or recalls 25 - 49% of the time/requires cueing 50 - 75% of the time    Pain Pain Assessment Pain Score: 9  Pain Type: Acute pain Pain Location: Head Pain Descriptors / Indicators: Aching;Headache Pain Frequency: Intermittent Pain Onset: On-going Patients Stated Pain Goal: 3 Pain Intervention(s): Medication (See eMAR)  Therapy/Group: Individual Therapy  ,  12/31/2015, 2:07 PM

## 2015-12-31 NOTE — Progress Notes (Signed)
Occupational Therapy Session Note  Patient Details  Name: Kelly Wilson MRN: [id number] Date of Birth: 1955-08-01  Today's Date: 12/31/2015 OT Individual Time: 0945-1100 OT Individual Time Calculation (min): 75 min    Short Term Goals: Week 1:  OT Short Term Goal 1 (Week 1): Pt will perform toilet transfer with min A in order to increase I with functional transfers. OT Short Term Goal 2 (Week 1): Pt will perform clothing management during toileting with steady assist for balance. OT Short Term Goal 3 (Week 1): Pt will perform UB dressing with min A in order to increase I with functional tasks.  OT Short Term Goal 4 (Week 1): Pt will perform toilet transfer with min A in order to increae I with functional transfers.   Skilled Therapeutic Interventions/Progress Updates:    Upon entering the room, pt seated in wheelchair with 6/10 c/o pain described as headache. Pt required maximal coaxing to participate in therapy session from OT and RN. Pt stating , "I just want to go home." Pt eventually agreed to participate after continued education. OT propelled pt via wheelchair into bathroom for mod A stand pivot transfer onto standard toilet with use of grab bar. Pt able to void and have BM with assistance for clothing management. Pt seated in wheelchair at sink for dressing and bathing tasks. Pt only washing buttocks and peri area with steady assist for balance in standing position. Pt dressed with sit <>stand from wheelchair and mod multimodal cues. Pt with increased apraxia this session as she put toothpaste on finger and placed in mouth to brush teeth. She was also unable to problem solve turning water on and off with cues and increased time. Pt remaining in wheelchair at end of session with quick release belt donned and call bell within reach upon exiting the room.   Therapy Documentation Precautions:  Precautions Precautions: Fall Restrictions Weight Bearing Restrictions: No   Pain: Pain  Assessment Pain Score: 0-No pain  See Function Navigator for Current Functional Status.   Therapy/Group: Individual Therapy  Phineas Semen 12/31/2015, 12:43 PM

## 2015-12-31 NOTE — Progress Notes (Signed)
Physical Therapy Session Note  Patient Details  Name: Kelly Wilson MRN: [id number] Date of Birth: 03-24-1956  Today's Date: 12/31/2015 PT Individual Time: 1115-1202 PT Individual Time Calculation (min): 47 min   Short Term Goals: Week 1:  PT Short Term Goal 1 (Week 1): Pt will perform bed mobility on flat bed with min A and min cues for initiation/sequencing PT Short Term Goal 2 (Week 1): Pt will perform stand pivot transfers with LRAD and min A and min cues for initiation/sequencing PT Short Term Goal 3 (Week 1): Pt will perform w/c mobility x 150' for endurance training with min A and 2 rest breaks PT Short Term Goal 4 (Week 1): Pt will ambulate 4' with LRAD with improved gait speed and min A and min cues for safety, sequencing and initiation PT Short Term Goal 5 (Week 1): Pt will negotiate 4 stairs with 2 rails and min A  Skilled Therapeutic Interventions/Progress Updates:    Pt received resting in w/c with c/o pain in posterior neck but does not rate, RN in attempting to distribute meds quickly and pt visibly frustrated; PT provided emotional support and encouragement to pt.  Session focus on patient education, memory, transfers, and stair negotiation.  Pt taken outside for therapy to improve mood.  PT discussed yesterday's session and pt's visit with pastor as well as OT session earlier that day, but pt unable to recall activities in therapy and required max verbal cues to recall pastor's visit.  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  PT instructed pt in stair negotiation outside x4 steps with L descending rail, BUE on rail with mod assist and max verbal cues.  Pt noted to be more apraxic and slow to initiate today compared to yesterday's session.  Pt returned to room at end of session and transferred squat/pivot to bed with steady assist.  Pt positioned supine in bed with bed alarm activated, call bell in reach and  needs met.   Therapy Documentation Precautions:  Precautions Precautions: Fall Restrictions Weight Bearing Restrictions: No   See Function Navigator for Current Functional Status.   Therapy/Group: Individual Therapy  Earnest Conroy Penven-Crew 12/31/2015, 12:31 PM

## 2015-12-31 NOTE — Progress Notes (Addendum)
[place] PHYSICAL MEDICINE & REHABILITATION     PROGRESS NOTE    Subjective/Complaints: Patient lying in bed this morning. She states she does not she slept well overnight. She notes her headaches have resolved.  ROS: Denies CP, COB, N/V/D.  Objective: Vital Signs: Blood pressure 148/66, pulse 65, temperature 98.5 F (36.9 C), temperature source Oral, resp. rate 17, height 5\' 6"  (1.676 m), weight 108 kg (238 lb 1.6 oz), SpO2 100 %. No results found.  Recent Labs  12/29/15 0504  WBC 9.5  HGB 13.1  HCT 38.7  PLT 294    Recent Labs  12/30/15 0605 12/31/15 0435  NA 133* 133*  K 3.8 3.4*  CL 99* 99*  GLUCOSE 113* 101*  BUN 6 8  CREATININE 0.58 0.67  CALCIUM 8.9 9.2   CBG (last 3)  No results for input(s): GLUCAP in the last 72 hours.  Wt Readings from Last 3 Encounters:  12/27/15 108 kg (238 lb 1.6 oz)  12/16/15 108.863 kg (240 lb)  11/05/15 114.306 kg (252 lb)    Physical Exam:  Constitutional: She appears well-developed. No distress. Obese  HENT: Normocephalic and atraumatic.  Eyes: EOM and Conj are normal.  Neck: Normal range of motion. Neck supple. No JVD present. No tracheal deviation present. No thyromegaly present.  Posterior head incision intact  Cardiovascular: Normal rate and regular rhythm.  Respiratory: Effort normal and breath sounds normal. No respiratory distress.  GI: Soft. Bowel sounds are normal. She exhibits no distension.  Neurological: She is alert and oriented3 with increased time.    Fair insight and awareness.  Strength 4+/5 bilateral upper ext and 4+/5 HF, 4+/5 KE and 5/5 ADFPF.  Skin: She is not diaphoretic. Surgical incision intact. Red/macular rash in inguinal folds and back.  Psychiatric: She has a normal mood and affect. Her behavior is normal.     Assessment/Plan: 1. 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 Physiatrist is providing close team supervision and 24 hour management of active medical problems listed below. Physiatrist and rehab team continue to assess barriers to discharge/monitor patient progress toward functional and medical goals.  Function:  Bathing Bathing position   Position: Other (comment) (seated on commode)  Bathing parts Body parts bathed by patient: Right arm, Left arm, Chest, Abdomen, Front perineal area, Right upper leg, Left upper leg, Right lower leg, Left lower leg Body parts bathed by helper: Back, Buttocks  Bathing assist Assist Level: Touching or steadying assistance(Pt > 75%)      Upper Body Dressing/Undressing Upper body dressing   What is the patient wearing?: Hospital gown     Pull over shirt/dress - Perfomed by patient: Thread/unthread right sleeve, Thread/unthread left sleeve, Put head through opening, Pull shirt over trunk          Upper body assist Assist Level: Set up   Set up : To obtain clothing/put away  Lower Body Dressing/Undressing Lower body dressing   What is the patient wearing?: Ted Hose, Pants, Shoes     Pants- Performed by patient: Thread/unthread right pants leg, Thread/unthread left pants leg Pants- Performed by helper: Pull pants up/down   Non-skid slipper socks- Performed by helper: Don/doff right sock, Don/doff left sock     Shoes - Performed by patient: Don/doff right shoe, Don/doff left shoe, Fasten right, Fasten left         TED Hose - Performed by helper: Don/doff  right TED hose, Don/doff left TED hose  Lower body assist Assist for lower body dressing: Touching or steadying assistance (Pt > 75%)      Toileting Toileting   Toileting steps completed by patient: Adjust clothing prior to toileting, Adjust clothing after toileting Toileting steps completed by helper: Performs perineal hygiene Toileting Assistive Devices: Grab bar or rail  Toileting assist Assist level: Touching or  steadying assistance (Pt.75%)   Transfers Chair/bed transfer   Chair/bed transfer method: Stand pivot, Ambulatory Chair/bed transfer assist level: Moderate assist (Pt 50 - 74%/lift or lower) (mod assist for ambulatory approach, steady for stand/pivot) Chair/bed transfer assistive device: Walker, Air cabin crew     Max distance: 100 Assist level: Moderate assist (Pt 50 - 74%)   Wheelchair   Type: Manual Max wheelchair distance: 200 Assist Level: Supervision or verbal cues  Cognition Comprehension Comprehension assist level: Understands basic 75 - 89% of the time/ requires cueing 10 - 24% of the time  Expression Expression assist level: Expresses basic 50 - 74% of the time/requires cueing 25 - 49% of the time. Needs to repeat parts of sentences.  Social Interaction Social Interaction assist level: Interacts appropriately 50 - 74% of the time - May be physically or verbally inappropriate.  Problem Solving Problem solving assist level: Solves basic 50 - 74% of the time/requires cueing 25 - 49% of the time  Memory Memory assist level: Recognizes or recalls 25 - 49% of the time/requires cueing 50 - 75% of the time   Medical Problem List and Plan: 1. 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 DVT Prophylaxis/Anticoagulation: Subcutaneous Lovenox initiated 12/22/2015. Monitor for any bleeding episodes 3. Pain Management: Neurontin 900 mg every 8 hours.  4. Hypertension. Maxide daily,Nimotop 60 mg every 4 hours 21 days initiated 12/17/2015  Will cont to monitor 5. Neuropsych: This patient is capable of making decisions on her own behalf. 6. Skin/Wound Care: Routine skin checks 7. Fluids/Electrolytes/Nutrition:  Discussed intake with patient   Added megace to help stimulate appetite  IVF d/ced on 6/12  Ate 5-25 % of her meals  yesterday  Hypokalemia: 3.4 on 6/13, supplemented 8. Seizure prophylaxis. Keppra 500 mg twice a day, valproic acid 500 mg daily. EEG negative 9. Hyponatremia.   Continue sodium chloride tablets for now.  Na 133 on 6/12 (stable) 10. Heat rash  Patient has not had a shower in several days, encourage cleansing. Continue hypoallergenic sheets  LOS (Days) 4 A FACE TO FACE EVALUATION WAS PERFORMED  Ankit Lorie Phenix 12/31/2015 9:31 AM

## 2015-12-31 NOTE — Progress Notes (Signed)
Physical Therapy Session Note  Patient Details  Name: Kelly Wilson MRN: [id number] Date of Birth: 07-Jul-1956  Today's Date: 12/31/2015 PT Individual Time: [id number] PT Individual Time Calculation (min): 14 min   Short Term Goals: Week 1:  PT Short Term Goal 1 (Week 1): Pt will perform bed mobility on flat bed with min A and min cues for initiation/sequencing PT Short Term Goal 2 (Week 1): Pt will perform stand pivot transfers with LRAD and min A and min cues for initiation/sequencing PT Short Term Goal 3 (Week 1): Pt will perform w/c mobility x 150' for endurance training with min A and 2 rest breaks PT Short Term Goal 4 (Week 1): Pt will ambulate 78' with LRAD with improved gait speed and min A and min cues for safety, sequencing and initiation PT Short Term Goal 5 (Week 1): Pt will negotiate 4 stairs with 2 rails and min A  Skilled Therapeutic Interventions/Progress Updates:   Pt received in w/c with multiple friends and church members present to visit with patient.  Attempted to initiate vestibular evaluation with pt but pt stating she was very fatigued and wishing to return to bed.  Pt performed stand pivot w/c > bed with extra time to initiate and min HHA with verbal cues for sequence of pivoting.  Performed scooting to [facility] with supervision but verbal cues to initiate.  Pt performed sit > supine with supervision, extra time but verbal cues to initiate.  Pt positioned and left with all items within reach. Will attempt to perform vestibular evaluation tomorrow but pt did not voice any c/o dizziness, nausea or pain during transfers.    Therapy Documentation Precautions:  Precautions Precautions: Fall Restrictions Weight Bearing Restrictions: No General: PT Amount of Missed Time (min): 16 Minutes PT Missed Treatment Reason: Patient fatigue Vital Signs: Therapy Vitals Temp: 98 F (36.7 C) Temp Source: Oral BP: (!) 133/51 mmHg Patient Position (if appropriate): Sitting Oxygen  Therapy SpO2: 99 % O2 Device: Not Delivered Pain:  No c/o pain  See Function Navigator for Current Functional Status.   Therapy/Group: Individual Therapy  Raylene Everts [facility] 12/31/2015, 3:59 PM

## 2016-01-01 ENCOUNTER — Inpatient Hospital Stay (HOSPITAL_COMMUNITY): Payer: BLUE CROSS/BLUE SHIELD | Admitting: Occupational Therapy

## 2016-01-01 ENCOUNTER — Inpatient Hospital Stay (HOSPITAL_COMMUNITY): Payer: BLUE CROSS/BLUE SHIELD | Admitting: Speech Pathology

## 2016-01-01 ENCOUNTER — Inpatient Hospital Stay (HOSPITAL_COMMUNITY): Payer: BLUE CROSS/BLUE SHIELD | Admitting: Physical Therapy

## 2016-01-01 ENCOUNTER — Inpatient Hospital Stay (HOSPITAL_COMMUNITY): Payer: BLUE CROSS/BLUE SHIELD

## 2016-01-01 DIAGNOSIS — G441 Vascular headache, not elsewhere classified: Secondary | ICD-10-CM | POA: Insufficient documentation

## 2016-01-01 DIAGNOSIS — R519 Headache, unspecified: Secondary | ICD-10-CM | POA: Insufficient documentation

## 2016-01-01 DIAGNOSIS — R51 Headache: Secondary | ICD-10-CM

## 2016-01-01 DIAGNOSIS — R5383 Other fatigue: Secondary | ICD-10-CM | POA: Insufficient documentation

## 2016-01-01 LAB — URINALYSIS, ROUTINE W REFLEX MICROSCOPIC
BILIRUBIN URINE: NEGATIVE
GLUCOSE, UA: NEGATIVE mg/dL
KETONES UR: 15 mg/dL — AB
Nitrite: NEGATIVE
PH: 7 (ref 5.0–8.0)
Protein, ur: NEGATIVE mg/dL
SPECIFIC GRAVITY, URINE: 1.022 (ref 1.005–1.030)

## 2016-01-01 LAB — URINE MICROSCOPIC-ADD ON

## 2016-01-01 MED ORDER — MEGESTROL ACETATE 400 MG/10ML PO SUSP
800.0000 mg | Freq: Two times a day (BID) | ORAL | Status: DC
  Administered 2016-01-02 – 2016-01-14 (×23): 800 mg via ORAL
  Filled 2016-01-01 (×28): qty 20

## 2016-01-01 MED ORDER — AMANTADINE HCL 100 MG PO CAPS
100.0000 mg | ORAL_CAPSULE | Freq: Two times a day (BID) | ORAL | Status: DC
  Administered 2016-01-01 – 2016-01-07 (×11): 100 mg via ORAL
  Filled 2016-01-01 (×12): qty 1

## 2016-01-01 NOTE — Progress Notes (Signed)
Physical Therapy Session Note  Patient Details  Name: Kelly Wilson MRN: [id number] Date of Birth: 1955-11-15  Today's Date: 01/01/2016 PT Individual Time: 1105-1210 PT Individual Time Calculation (min): 65 min   Short Term Goals: Week 1:  PT Short Term Goal 1 (Week 1): Pt will perform bed mobility on flat bed with min A and min cues for initiation/sequencing PT Short Term Goal 2 (Week 1): Pt will perform stand pivot transfers with LRAD and min A and min cues for initiation/sequencing PT Short Term Goal 3 (Week 1): Pt will perform w/c mobility x 150' for endurance training with min A and 2 rest breaks PT Short Term Goal 4 (Week 1): Pt will ambulate 12' with LRAD with improved gait speed and min A and min cues for safety, sequencing and initiation PT Short Term Goal 5 (Week 1): Pt will negotiate 4 stairs with 2 rails and min A  Skilled Therapeutic Interventions/Progress Updates:    Pt received resting in w/c c/o moderate HA pain (RN in to medicate).  Pt required max to total cues for participation in therapy today, demonstrating decreased attention, awareness, and initiation.  Pt frequently holding head in hands and saying "Oh God!" throughout session.  Session focus on functional mobility, attention to task, problem solving, and initiation.    Pt taken to [facility] gym for quiet environment and engaged in task for attention and following directions.  Pt required 10 minutes and max multimodal cues to turn 5 puzzle pieces right side up.  Improved attention to task (~1-1.5 minutes) when putting puzzle pieces back in box and when performing simple sorting task from a field of 2-3 with mod verbal cues.   W/C propulsion with BUEs x150' back towards room with min assist to avoid veering to the L.  Pt able to locate room with mod verbal cues.  Pt required +2 total assist for squat/pivot from w/c>bed due to decreased attention and progressing agitation.  Pt left supine in bed with bed alarm activated, call  bell in reach and needs met.   Therapy Documentation Precautions:  Precautions Precautions: Fall Restrictions Weight Bearing Restrictions: No   See Function Navigator for Current Functional Status.   Therapy/Group: Individual Therapy  Earnest Conroy Penven-Crew 01/01/2016, 12:27 PM

## 2016-01-01 NOTE — Progress Notes (Signed)
Pt refused all meals with fluid intake less than 222ml all shift. RN provided patient with encouragement and importance of adequate nutrition and fluid intake. Pt continues to refuse when fluids and food offered. PA and MD aware of poor appetite and fluid intake.

## 2016-01-01 NOTE — Plan of Care (Signed)
Problem: RH PAIN MANAGEMENT Goal: RH STG PAIN MANAGED AT OR BELOW PT'S PAIN GOAL 3/10  Outcome: Not Progressing Patient complains of constant headache regardless of medications given. Pt will go to sleep when staff leaves the room.

## 2016-01-01 NOTE — Progress Notes (Signed)
Pt noted to have increased lethargy, headaches, nausea and change in mental status.  Stat CT ordered

## 2016-01-01 NOTE — Progress Notes (Signed)
Physical Therapy Note  Patient Details  Name: ALFREDO VOLLRATH MRN: [id number] Date of Birth: 1955/12/07 Today's Date: 01/01/2016  Pain: none noted 0900-1000, 60 min individual.  In sitting, pt doffed socks with tactile cues.  PT donned TEDS and shoes.  W/c propulsion x 150' using bil UEs with min assist, multimodal cues.  Therapeutic activity in supported sitting> unsupported sitting, kicking ball with either foot, cues to swing through. Dual task activity of choosing 1 of 3 named figures to grasp slightly out of BOS, to facilitate trunk shortening/lengthening/rotating .  Figures placed in container on opposite side; mod cues to stay on task x 3 minutes.  Pt correctly chose figure 5/6 trials. Pt reported " swimmy headed" feeling with head and eye movements; improved with seated rest.    Gait with grocery cart mod assist x 177', cue for increasing velocity.  10MWT = 2.34'/second. Gait to return to return to room, using w/c for AD, x 166'.  Pt left resting in w/c with quick release belt applied and all needs within reach, and husband present. With choice of 2 names, pt remembered this PT's name by end of session.  , 01/01/2016, 7:51 AM

## 2016-01-01 NOTE — Progress Notes (Signed)
[facility] PHYSICAL MEDICINE & REHABILITATION     PROGRESS NOTE    Subjective/Complaints: Pt seen sitting up at the edge of the bed. She is lethargic this AM.  Pt's husband states she is just waking up.  She continues to have a poor appetite.   ROS: Denies CP, COB, N/V/D.  Objective: Vital Signs: Blood pressure 135/65, pulse 86, temperature 98.6 F (37 C), temperature source Oral, resp. rate 18, height 5\' 6"  (1.676 m), weight 108 kg (238 lb 1.6 oz), SpO2 98 %. No results found. No results for input(s): WBC, HGB, HCT, PLT in the last 72 hours.  Recent Labs  12/30/15 0605 12/31/15 0435  NA 133* 133*  K 3.8 3.4*  CL 99* 99*  GLUCOSE 113* 101*  BUN 6 8  CREATININE 0.58 0.67  CALCIUM 8.9 9.2   CBG (last 3)  No results for input(s): GLUCAP in the last 72 hours.  Wt Readings from Last 3 Encounters:  12/27/15 108 kg (238 lb 1.6 oz)  12/16/15 108.863 kg (240 lb)  11/05/15 114.306 kg (252 lb)    Physical Exam:  Constitutional: She appears well-developed. No distress. Obese  HENT: Normocephalic and atraumatic.  Eyes: EOM and Conj are normal.  Neck: Normal range of motion. Neck supple. No JVD present. No tracheal deviation present. No thyromegaly present.  Posterior head incision intact  Cardiovascular: Normal rate and regular rhythm.  Respiratory: Effort normal and breath sounds normal. No respiratory distress.  GI: Soft. Bowel sounds are normal. She exhibits no distension.  Neurological: She is lethargic and oriented.    Fair insight and awareness.  Strength 4+/5 bilateral upper ext and 4+/5 HF, 4+/5 KE and 5/5 ADFPF.  Skin: She is not diaphoretic. Surgical incision intact. Red/macular rash on back, slightly improved.  Psychiatric: She has a normal mood and affect. Her behavior is normal.     Assessment/Plan: 1. 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 Physiatrist is providing close team supervision and 24 hour management of active medical problems listed below. Physiatrist and rehab team continue to assess barriers to discharge/monitor patient progress toward functional and medical goals.  Function:  Bathing Bathing position   Position: Wheelchair/chair at sink  Bathing parts Body parts bathed by patient: Front perineal area, Buttocks (2/2 parts ) Body parts bathed by helper: Back, Buttocks  Bathing assist Assist Level: Touching or steadying assistance(Pt > 75%)      Upper Body Dressing/Undressing Upper body dressing   What is the patient wearing?: Bra, Pull over shirt/dress Bra - Perfomed by patient: Thread/unthread right bra strap, Thread/unthread left bra strap, Hook/unhook bra (pull down sports bra)   Pull over shirt/dress - Perfomed by patient: Thread/unthread right sleeve, Thread/unthread left sleeve, Put head through opening, Pull shirt over trunk          Upper body assist Assist Level: Set up   Set up : To obtain clothing/put away  Lower Body Dressing/Undressing Lower body dressing   What is the patient wearing?: Ted Hose, Pants, Shoes     Pants- Performed by patient: Thread/unthread right pants leg, Thread/unthread left pants leg Pants- Performed by helper: Pull pants up/down   Non-skid slipper socks- Performed by helper: Don/doff right sock, Don/doff left sock     Shoes - Performed by patient: Don/doff right shoe, Don/doff left shoe, Fasten right, Fasten left         TED Hose -  Performed by helper: Don/doff right TED hose, Don/doff left TED hose  Lower body assist Assist for lower body dressing: Touching or steadying assistance (Pt > 75%)      Toileting Toileting   Toileting steps completed by patient: Performs perineal hygiene Toileting steps completed by helper: Adjust clothing prior to toileting, Performs perineal hygiene, Adjust clothing after toileting Toileting Assistive  Devices: Other (comment)  Toileting assist Assist level: Touching or steadying assistance (Pt.75%), Two helpers   Transfers Chair/bed transfer   Chair/bed transfer method: Stand pivot Chair/bed transfer assist level: Moderate assist (Pt 50 - 74%/lift or lower) Chair/bed transfer assistive device: Armrests     Locomotion Ambulation     Max distance: 166 Assist level: Moderate assist (Pt 50 - 74%)   Wheelchair   Type: Manual Max wheelchair distance: 166 Assist Level: Touching or steadying assistance (Pt > 75%)  Cognition Comprehension Comprehension assist level: Understands basic 75 - 89% of the time/ requires cueing 10 - 24% of the time  Expression Expression assist level: Expresses basic 50 - 74% of the time/requires cueing 25 - 49% of the time. Needs to repeat parts of sentences.  Social Interaction Social Interaction assist level: Interacts appropriately 50 - 74% of the time - May be physically or verbally inappropriate.  Problem Solving Problem solving assist level: Solves basic 50 - 74% of the time/requires cueing 25 - 49% of the time  Memory Memory assist level: Recognizes or recalls 25 - 49% of the time/requires cueing 50 - 75% of the time   Medical Problem List and Plan: 1. 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 DVT Prophylaxis/Anticoagulation: Subcutaneous Lovenox initiated 12/22/2015. Monitor for any bleeding episodes 3. Pain Management: Neurontin 900 mg every 8 hours.  4. Hypertension. Maxide daily,Nimotop 60 mg every 4 hours 21 days initiated 12/17/2015  Will cont to monitor 5. Neuropsych: This patient is capable of making decisions on her own behalf. 6. Skin/Wound Care: Routine skin checks 7.  Fluids/Electrolytes/Nutrition:  Discussed intake with patient   Added megace to help stimulate appetite, increased on 6/14.   IVF d/ced on 6/12  Ate 0-25 % of her meals yesterday  Hypokalemia: 3.4 on 6/13, supplemented 8. Seizure prophylaxis. Keppra 500 mg twice a day, valproic acid 500 mg daily. EEG negative 9. Hyponatremia.   Continue sodium chloride tablets for now.  Na 133 on 6/12 (stable) 10. Heat rash  Patient has not had a shower in several days, encourage cleansing. Pt refused yesterday.  Continue hypoallergenic sheets  LOS (Days) 5 A FACE TO FACE EVALUATION WAS PERFORMED   Lorie Phenix 01/01/2016 10:17 AM

## 2016-01-01 NOTE — Progress Notes (Signed)
Occupational Therapy Session Note  Patient Details  Name: Kelly Wilson MRN: [id number] Date of Birth: 1955/09/04  Today's Date: 01/01/2016 OT Individual Time: [id number] OT Individual Time Calculation (min): 59 min    Short Term Goals: Week 1:  OT Short Term Goal 1 (Week 1): Pt will perform toilet transfer with min A in order to increase I with functional transfers. OT Short Term Goal 2 (Week 1): Pt will perform clothing management during toileting with steady assist for balance. OT Short Term Goal 3 (Week 1): Pt will perform UB dressing with min A in order to increase I with functional tasks.  OT Short Term Goal 4 (Week 1): Pt will perform toilet transfer with min A in order to increae I with functional transfers.   Skilled Therapeutic Interventions/Progress Updates:    Upon entering the room, pt supine in bed upon entering the room with husband present. Pt reports 10/10 pain described as headache. Pt requiring increased coaxing for participation. Pt needing 1 step verbal commands throughout session , increased time, and max multimodal cues for initiation, sequencing, and attending to task. Pt needing mod A for supine >sit on EOB. Pt ambulated 10' to bathroom with RW and mod A. Pt needing assist for RW management as she does not scan environment or make directional changes as needed without assist. Pt performed toilet transfer with mod A for lifting. Dressing task performed while seated on toilet this session with increased assistance this session. Pt remained in wheelchair with quick release belt donned and husband remaining in room.   Therapy Documentation Precautions:  Precautions Precautions: Fall Restrictions Weight Bearing Restrictions: No Pain: Pain Assessment Faces Pain Scale: Hurts whole lot Pain Type: Acute pain Pain Location: Head Pain Descriptors / Indicators: Aching Pain Intervention(s): Medication (See eMAR)  See Function Navigator for Current Functional  Status.   Therapy/Group: Individual Therapy  Phineas Semen 01/01/2016, 11:42 AM

## 2016-01-01 NOTE — Patient Care Conference (Signed)
Inpatient RehabilitationTeam Conference and Plan of Care Update Date: 01/01/2016   Time: 2:40 PM    Patient Name: Kelly Wilson      Medical Record Number: [id number]  Date of Birth: 17-Jun-1956 Sex: Female         Room/Bed: 4W25C/4W25C-01 Payor Info: Payor: [place] / Plan: [employer] OTHER / Product Type: *No Product type* /    Admitting Diagnosis: ICH ADMIT  Admit Date/Time:  12/27/2015  4:53 PM Admission Comments: No comment available   Primary Diagnosis:  IVH (intraventricular hemorrhage) (H[facility]) Principal Problem: IVH (intraventricular hemorrhage) ([place])  Patient Active Problem List   Diagnosis Date Noted  . Lethargic   . Lethargy   . Bilateral headaches   . Other vascular headache   . Hypokalemia   . Heat rash   . Poor appetite   . Hyponatremia   . Cephalalgia   . Obstructive hydrocephalus 12/17/2015  . 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[place]) 12/17/2015  . Headache 12/17/2015  . IVH (intraventricular hemorrhage) ([place]) 12/16/2015  . Chest pain 05/10/2015  . Precordial chest pain 05/10/2015  . Chest wall pain 05/10/2015  . Pain in the chest   . Wellness examination 01/24/2015  . Myalgia 11/14/2014  . Muscle cramps 11/14/2014  . Pinched nerve 08/28/2014  . Routine general medical examination at a health care facility 08/12/2013  . Cough 04/21/2012  . Lymphadenitis, acute 10/23/2011  . GERD 08/09/2008  . OTHER DYSPHAGIA 06/18/2008  . 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 NECK DISORDER 05/22/2008  . HEADACHE 08/16/2007  . OTITIS MEDIA, ACUTE, LEFT 08/11/2007  . OTHER ABNORMAL BLOOD CHEMISTRY 07/25/2007  . HYPERLIPIDEMIA 05/17/2007  . Essential hypertension 05/17/2007  . OSTEOARTHRITIS 05/17/2007  . SPINAL STENOSIS 05/17/2007  . History of cardiovascular disorder 05/17/2007    Expected Discharge Date: Expected Discharge Date: 01/15/16  Team Members Present: Physician leading conference: Dr. 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 occassional bladder accidents. LBM: 6/13- does not use call bell   cont x2 min assist  continue with plan of care- timed tolieting   Swallow/Nutrition/ Hydration             ADL's   mod A overall with self care, mod - max multimodal cues for attention, sequening, problem solving, and initiation  supervision overall  pt/family edu, ADL training, balance, activity tolerance   Mobility   typically min/mod assist for transfers and gait with RW, min assist for w/c propulsion  supervision  balance, functional mobility, initiation, attention, awareness   Communication             Safety/Cognition/ Behavioral Observations  Max-Total A   Supervision  attention, initiation with verbal expresssion and functional tasks, orientaiton    Pain   constant headache. patient says she gets no relief from medication however at night will go to sleep after medication administration.   <3  assess and treat qshift and PRN    Skin   rash to back/buttocks iwth nystatin powder & hypoallergenic sheets   no further breakdown/infection  continmue with  plan of care      *See Care Plan and progress notes for long and short-term goals.  Barriers to Discharge: Lethargy, hyponatremia, poor appetite, rash    Possible Resolutions to Barriers:  Folllow labs, adjust meds for appetite, hygiene for rash, stat CT head ordered    Discharge Planning/Teaching Needs:  Plan for pt to d/c home with husband and family to provide 24/7 assistance.   Teaching to be scheduled closer to d/c   Team Discussion:  Decreased participation and initiation today and increase of HA - MD aware and has ordered CT scan.  Poor po intake even on reg diet.  MD had begun stimulant today.  Can fluctuate with therapies in abilities overall.  Hoping to reach supervision goals.    Revisions to Treatment Plan:  Not at this time.   Continued Need for Acute Rehabilitation Level of Care: The patient requires daily medical management by a physician with specialized training in physical medicine and rehabilitation for the following conditions: Daily direction of a multidisciplinary physical rehabilitation program to ensure safe treatment while eliciting the highest outcome that is of practical value to the patient.: Yes Daily medical management of patient stability for increased activity during participation in an intensive rehabilitation regime.: Yes Daily analysis of laboratory values and/or radiology reports with any subsequent need for medication adjustment of medical intervention for : Post surgical problems;Neurological problems;Renal problems  ,  01/01/2016, 4:46 PM

## 2016-01-01 NOTE — Progress Notes (Signed)
Speech Language Pathology Daily Session Note  Patient Details  Name: Kelly Wilson MRN: [id number] Date of Birth: 09-27-1955  Today's Date: 01/01/2016 SLP Individual Time: 1300-1345 SLP Individual Time Calculation (min): 45 min  Short Term Goals: Week 1: SLP Short Term Goal 1 (Week 1): Patient will initiate functional tasks with Min A verbal cues.  SLP Short Term Goal 2 (Week 1): Patient will demonstrate functional problem solving for basic and familiar tasks with Min A verbal cues.  SLP Short Term Goal 3 (Week 1): Patient will utilize external memory aids to recall new, daily information with Min A question and verbal cues.  SLP Short Term Goal 4 (Week 1): Patient will utilize call bell to request assistance in 25% of observable opportunities.  SLP Short Term Goal 5 (Week 1): Patient will self-monitor and correct errors during functional tasks/verbal conversation with Min A question cues.  SLP Short Term Goal 6 (Week 1): Patient will demonstrate sustained attention to a functional task for ~30 minutes with Min A verbal cues for redirection.   Skilled Therapeutic Interventions: Skilled treatment session focused on cognitive-linguistic goals. Upon arrival, patient was asleep while supine in bed and difficult to arouse. Patient was transferred to the wheelchair with +2 assist for safety to maximize arousal and participation with more than a reasonable amount of time. Patient continued to demonstrate lethargy and was essentially nonverbal throughout the session despite extra time and Max A multimodal cues. Patient also required total A for initiation of any functional task. Patient with frequent grimacing and reporting a severe headache by intermittent answering yes/no questions with head nods, RN notified and administered medications. After medications, patient appeared to gag and reported nausea without actual emesis. Patient transferred back to bed at end of session with +2 assist. MD made aware  of cognitive-linguistic change. Patient left semi-reclined in bed with all needs within reach. Continue with current plan of care.    Function:   Cognition Comprehension Comprehension assist level: Understands basic 25 - 49% of the time/ requires cueing 50 - 75% of the time  Expression   Expression assist level: Expresses basis less than 25% of the time/requires cueing >75% of the time.  Social Interaction Social Interaction assist level: Interacts appropriately 25 - 49% of time - Needs frequent redirection.  Problem Solving Problem solving assist level: Solves basic 25 - 49% of the time - needs direction more than half the time to initiate, plan or complete simple activities  Memory Memory assist level: Recognizes or recalls 25 - 49% of the time/requires cueing 50 - 75% of the time    Pain Pain Assessment Faces Pain Scale: Hurts worst Pain Type: Acute pain Pain Location: Head Pain Descriptors / Indicators: Headache Pain Onset: On-going Pain Intervention(s): Medication (See eMAR)  Therapy/Group: Individual Therapy  ,  01/01/2016, 2:32 PM

## 2016-01-02 ENCOUNTER — Inpatient Hospital Stay (HOSPITAL_COMMUNITY): Payer: BLUE CROSS/BLUE SHIELD | Admitting: Speech Pathology

## 2016-01-02 ENCOUNTER — Inpatient Hospital Stay (HOSPITAL_COMMUNITY): Payer: BLUE CROSS/BLUE SHIELD | Admitting: Physical Therapy

## 2016-01-02 ENCOUNTER — Inpatient Hospital Stay (HOSPITAL_COMMUNITY): Payer: BLUE CROSS/BLUE SHIELD | Admitting: Occupational Therapy

## 2016-01-02 DIAGNOSIS — R451 Restlessness and agitation: Secondary | ICD-10-CM | POA: Insufficient documentation

## 2016-01-02 DIAGNOSIS — F419 Anxiety disorder, unspecified: Secondary | ICD-10-CM | POA: Insufficient documentation

## 2016-01-02 DIAGNOSIS — N390 Urinary tract infection, site not specified: Secondary | ICD-10-CM | POA: Insufficient documentation

## 2016-01-02 LAB — BASIC METABOLIC PANEL
Anion gap: 10 (ref 5–15)
BUN: 13 mg/dL (ref 6–20)
CHLORIDE: 97 mmol/L — AB (ref 101–111)
CO2: 23 mmol/L (ref 22–32)
Calcium: 9.1 mg/dL (ref 8.9–10.3)
Creatinine, Ser: 0.71 mg/dL (ref 0.44–1.00)
GFR calc Af Amer: >60 mL/min (ref >60)
GFR calc non Af Amer: >60 mL/min (ref >60)
GLUCOSE: 97 mg/dL (ref 65–99)
POTASSIUM: 3.7 mmol/L (ref 3.5–5.1)
SODIUM: 130 mmol/L — AB (ref 135–145)

## 2016-01-02 LAB — CBC WITH DIFFERENTIAL/PLATELET
BASOS ABS: 0 10*3/uL (ref 0.0–0.1)
BASOS PCT: 0 %
Eosinophils Absolute: 0.1 10*3/uL (ref 0.0–0.7)
Eosinophils Relative: 1 %
HEMATOCRIT: 39.2 % (ref 36.0–46.0)
HEMOGLOBIN: 13.5 g/dL (ref 12.0–15.0)
LYMPHS PCT: 16 %
Lymphs Abs: 1.4 10*3/uL (ref 0.7–4.0)
MCH: 28 pg (ref 26.0–34.0)
MCHC: 34.4 g/dL (ref 30.0–36.0)
MCV: 81.3 fL (ref 78.0–100.0)
Monocytes Absolute: 1.1 10*3/uL — ABNORMAL HIGH (ref 0.1–1.0)
Monocytes Relative: 12 %
Neutro Abs: 6 10*3/uL (ref 1.7–7.7)
Neutrophils Relative %: 71 %
Platelets: 340 10*3/uL (ref 150–400)
RBC: 4.82 MIL/uL (ref 3.87–5.11)
RDW: 13.7 % (ref 11.5–15.5)
WBC: 8.5 10*3/uL (ref 4.0–10.5)

## 2016-01-02 MED ORDER — PROPRANOLOL HCL 10 MG PO TABS
10.0000 mg | ORAL_TABLET | Freq: Three times a day (TID) | ORAL | Status: DC
  Administered 2016-01-02 – 2016-01-06 (×13): 10 mg via ORAL
  Filled 2016-01-02 (×13): qty 1

## 2016-01-02 MED ORDER — NITROFURANTOIN MONOHYD MACRO 100 MG PO CAPS
100.0000 mg | ORAL_CAPSULE | Freq: Two times a day (BID) | ORAL | Status: AC
  Administered 2016-01-02 – 2016-01-05 (×7): 100 mg via ORAL
  Filled 2016-01-02 (×9): qty 1

## 2016-01-02 NOTE — Plan of Care (Signed)
Problem: RH PAIN MANAGEMENT Goal: RH STG PAIN MANAGED AT OR BELOW PT'S PAIN GOAL 3/10  Outcome: Not Progressing Rates headache 7/10-10/10

## 2016-01-02 NOTE — Progress Notes (Signed)
Social Work Patient ID: Kelly Wilson, female   DOB: 08/06/1955, 60 y.o.   MRN: [id number]   Have reviewed team conference information with spouse and daughter.  They are aware of targeted d/c date of 6/28 with supervision goals.  Both very concerned with pt's behavior/ mood and daughter feels pt having significant depression.  Will speak with MD about addition of medications if appropriate. Daughter asks if "she's going to get any better?"  Explained that we certainly hope for improvements and encouraged her to allow more time for recovery.  Will continue to follow for support and d/c planning needs.  , , LCSW

## 2016-01-02 NOTE — Progress Notes (Signed)
Speech Language Pathology Daily Session Note  Patient Details  Name: Kelly Wilson MRN: [id number] Date of Birth: 02/13/1956  Today's Date: 01/02/2016 SLP Individual Time: 1100-1155 SLP Individual Time Calculation (min): 55 min  Short Term Goals: Week 1: SLP Short Term Goal 1 (Week 1): Patient will initiate functional tasks with Min A verbal cues.  SLP Short Term Goal 2 (Week 1): Patient will demonstrate functional problem solving for basic and familiar tasks with Min A verbal cues.  SLP Short Term Goal 3 (Week 1): Patient will utilize external memory aids to recall new, daily information with Min A question and verbal cues.  SLP Short Term Goal 4 (Week 1): Patient will utilize call bell to request assistance in 25% of observable opportunities.  SLP Short Term Goal 5 (Week 1): Patient will self-monitor and correct errors during functional tasks/verbal conversation with Min A question cues.  SLP Short Term Goal 6 (Week 1): Patient will demonstrate sustained attention to a functional task for ~30 minutes with Min A verbal cues for redirection.   Skilled Therapeutic Interventions: Skilled treatment session focused on cognitive goals. Upon arrival, patient was asleep while sitting upright in the wheelchair and required Mod verbal cues for arousal. SLP facilitated session by providing more than a reasonable amount of time and Max A verbal and tactile cues for initiation of donning her glasses to prepare for her lunch. Patient initially required Mod verbal cues to initiate removing the lid off her tray and self-fed 3 bites with supervision verbal cues before declining remainder of meal. However, with Max encouragement, patient self-fed ~50% of a yogurt. Patient essentially nonverbal throughout session and randomly reported, "we are [place]." Patient transferred back to bed at end of session with extra time and Mod verbal cues for safety. Patient left supine in bed with alarm on and  all needs within reach. Continue with current plan of care.    Function:  Eating Eating   Modified Consistency Diet: No Eating Assist Level: Set up assist for;Supervision or verbal cues   Eating Set Up Assist For: Opening containers Helper Scoops Food on Utensil: Occasionally     Cognition Comprehension Comprehension assist level: Understands basic 25 - 49% of the time/ requires cueing 50 - 75% of the time  Expression   Expression assist level: Expresses basis less than 25% of the time/requires cueing >75% of the time.  Social Interaction Social Interaction assist level: Interacts appropriately 25 - 49% of time - Needs frequent redirection.  Problem Solving Problem solving assist level: Solves basic 25 - 49% of the time - needs direction more than half the time to initiate, plan or complete simple activities  Memory Memory assist level: Recognizes or recalls 25 - 49% of the time/requires cueing 50 - 75% of the time    Pain Patient reports a headache, unable to rate. RN provided medication  Therapy/Group: Individual Therapy  ,  01/02/2016, 3:46 PM

## 2016-01-02 NOTE — Progress Notes (Signed)
Physical Therapy Session Note  Patient Details  Name: Kelly Wilson MRN: [id number] Date of Birth: 04/16/1956  Today's Date: 01/02/2016 PT Individual Time: 1005-1100 PT Individual Time Calculation (min): 55 min   Short Term Goals: Week 1:  PT Short Term Goal 1 (Week 1): Pt will perform bed mobility on flat bed with min A and min cues for initiation/sequencing PT Short Term Goal 2 (Week 1): Pt will perform stand pivot transfers with LRAD and min A and min cues for initiation/sequencing PT Short Term Goal 3 (Week 1): Pt will perform w/c mobility x 150' for endurance training with min A and 2 rest breaks PT Short Term Goal 4 (Week 1): Pt will ambulate 59' with LRAD with improved gait speed and min A and min cues for safety, sequencing and initiation PT Short Term Goal 5 (Week 1): Pt will negotiate 4 stairs with 2 rails and min A  Skilled Therapeutic Interventions/Progress Updates:    Pt received resting in w/c, reports no pain when prompted, and agreeable to therapy with encouragement.  Session focus on attention, awareness, initiation, and functional mobility.    Pt continues to require more than a reasonable amount of time and max multimodal cues to initiate familiar and unfamiliar tasks.  Pt transfers sit<>stand with mod assist to rise.  Pt demos anterior lean in standing requiring min/mod assist to balance.    Pt engaged in a simple sorting task initially in standing, but unable to complete despite max cues.  Transitioned to sitting, but continued to be unable to sort into a field of 3 or 2.    Gait training x100' with RW and mod assist with verbal cues for step length and walker positioning.    W/C propulsion with BLEs for NMR with max verbal cues to stay attended to task.  Pt only able to propel w/c x15' over a period of 5 minutes with max cues.    Pt returned to room at end of session and left upright in w/c with call bell in reach and needs met. QRB in place.   Therapy  Documentation Precautions:  Precautions Precautions: Fall Restrictions Weight Bearing Restrictions: No   See Function Navigator for Current Functional Status.   Therapy/Group: Individual Therapy   E Penven-Crew 01/02/2016, 11:07 AM

## 2016-01-02 NOTE — Progress Notes (Signed)
[place] PHYSICAL MEDICINE & REHABILITATION     PROGRESS NOTE    Subjective/Complaints: Patient seen sitting up in her chair at the sink this morning. She is more alert and verbally responds this morning. Per OT patient with agitation and increased time to initiate this AM.  ROS: Denies CP, COB, N/V/D.  Objective: Vital Signs: Blood pressure 155/75, pulse 86, temperature 98.7 F (37.1 C), temperature source Oral, resp. rate 18, height 5\' 6"  (1.676 m), weight 109.2 kg (240 lb 11.9 oz), SpO2 96 %. Ct Head Wo Contrast  01/01/2016  CLINICAL DATA:  Lethargy EXAM: CT HEAD WITHOUT CONTRAST TECHNIQUE: Contiguous axial images were obtained from the base of the skull through the vertex without intravenous contrast. COMPARISON:  12/26/2015 FINDINGS: Again noted posterior craniotomy with prior aneurysm clipping. Ventricular size is stable from prior exam. No intracranial hemorrhage, mass effect or midline shift. The previous intraventricular hemorrhage in lateral ventricle has resolved. No acute cortical infarction. No definite hemorrhage in fourth ventricle. No new hemorrhage is noted. Mild cerebral atrophy is stable. IMPRESSION: 1. Stable postsurgical changes post posterior craniotomy and aneurysmal clipping. No intracranial hemorrhage, mass effect or midline shift. No acute cortical infarction. No intraventricular hemorrhage. Mild cerebral atrophy is stable. Electronically Signed   By: Lahoma Crocker M.D.   On: 01/01/2016 15:12    Recent Labs  01/02/16 0534  WBC 8.5  HGB 13.5  HCT 39.2  PLT 340    Recent Labs  12/31/15 0435 01/02/16 0534  NA 133* 130*  K 3.4* 3.7  CL 99* 97*  GLUCOSE 101* 97  BUN 8 13  CREATININE 0.67 0.71  CALCIUM 9.2 9.1   CBG (last 3)  No results for input(s): GLUCAP in the last 72 hours.  Wt Readings from Last 3 Encounters:  01/01/16 109.2 kg (240 lb 11.9 oz)  12/16/15 108.863 kg (240 lb)  11/05/15 114.306 kg (252 lb)    Physical Exam:  Constitutional: She  appears well-developed. No distress. Obese  HENT: Normocephalic and atraumatic.  Eyes: EOM and Conj are normal.  Neck: Normal range of motion. Neck supple. No JVD present. No tracheal deviation present. No thyromegaly present.  Posterior head incision intact  Cardiovascular: Normal rate and regular rhythm.  Respiratory: Effort normal and breath sounds normal. No respiratory distress.  GI: Soft. Bowel sounds are normal. She exhibits no distension.  Neurological: She is alert and oriented.    Fair insight and awareness.  Strength 4+/5 bilateral upper ext and 4+/5 HF, 4+/5 KE and 5/5 ADFPF.  Skin: She is not diaphoretic. Surgical incision intact. Red/macular rash on back, improving.  Psychiatric: She has a normal mood and affect. Her behavior is normal.     Assessment/Plan: 1. Impaired functional mobility and self-care secondary to IVH/hydrocephalus which require 3+ hours per day of interdisciplinary therapy in a comprehensive inpatient rehab setting. Physiatrist is providing close team supervision and 24 hour management of active medical problems listed below. Physiatrist and rehab team continue to assess barriers to discharge/monitor patient progress toward functional and medical goals.  Function:  Bathing Bathing position   Position: Shower  Bathing parts Body parts bathed by patient: Chest, Abdomen, Front perineal area, Left upper leg, Right upper leg Body parts bathed by helper: Right arm, Left arm, Buttocks, Right lower leg, Left lower leg, Back  Bathing assist Assist Level:  (mod A)      Upper Body Dressing/Undressing Upper body dressing   What is the patient wearing?: Pull over shirt/dress Bra - Perfomed by  patient: Thread/unthread right bra strap, Thread/unthread left bra strap, Hook/unhook bra (pull down sports bra)   Pull over shirt/dress - Perfomed by patient: Thread/unthread right sleeve, Thread/unthread left sleeve Pull over shirt/dress - Perfomed by helper: Put head  through opening, Pull shirt over trunk        Upper body assist Assist Level:  (mod A)   Set up : To obtain clothing/put away  Lower Body Dressing/Undressing Lower body dressing   What is the patient wearing?: Pants     Pants- Performed by patient: Thread/unthread right pants leg, Thread/unthread left pants leg Pants- Performed by helper: Thread/unthread right pants leg, Thread/unthread left pants leg, Pull pants up/down   Non-skid slipper socks- Performed by helper: Don/doff right sock, Don/doff left sock     Shoes - Performed by patient: Don/doff right shoe, Don/doff left shoe, Fasten right, Fasten left         TED Hose - Performed by helper: Don/doff right TED hose, Don/doff left TED hose  Lower body assist Assist for lower body dressing:  (total A)      Toileting Toileting   Toileting steps completed by patient: Performs perineal hygiene Toileting steps completed by helper: Adjust clothing prior to toileting, Performs perineal hygiene, Adjust clothing after toileting Toileting Assistive Devices: Grab bar or rail  Toileting assist Assist level:  (total A)   Transfers Chair/bed transfer   Chair/bed transfer method: Squat pivot Chair/bed transfer assist level: Maximal assist (Pt 25 - 49%/lift and lower) Chair/bed transfer assistive device: Armrests     Locomotion Ambulation     Max distance: 166 Assist level: Moderate assist (Pt 50 - 74%)   Wheelchair   Type: Manual Max wheelchair distance: 150 Assist Level: Touching or steadying assistance (Pt > 75%)  Cognition Comprehension Comprehension assist level: Understands basic 75 - 89% of the time/ requires cueing 10 - 24% of the time  Expression Expression assist level: Expresses basis less than 25% of the time/requires cueing >75% of the time.  Social Interaction Social Interaction assist level: Interacts appropriately 50 - 74% of the time - May be physically or verbally inappropriate.  Problem Solving Problem  solving assist level: Solves basic 25 - 49% of the time - needs direction more than half the time to initiate, plan or complete simple activities  Memory Memory assist level: Recognizes or recalls 25 - 49% of the time/requires cueing 50 - 75% of the time   Medical Problem List and Plan: 1. 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 Increase lethargy reviewed, stable   -Amantadine started on 6/14, will increase as tolerated versus change to Ritalin this patient with poor concentration deficits  -Will consider SSRI if no improvement in mood with amantadine  -Propanolol started on 6/15, will monitor for improvement in agitation and headaches, increase as tolerated. UTI may also be contributing. 2. DVT Prophylaxis/Anticoagulation: Subcutaneous Lovenox initiated 12/22/2015. Monitor for any bleeding episodes 3. Pain Management: Neurontin 900 mg every 8 hours.  4. Hypertension. Maxide daily,Nimotop 60 mg every 4 hours 21 days initiated 12/17/2015  Will cont to monitor, overall controlled 5. Neuropsych: This patient is capable of making decisions on her own behalf. 6. Skin/Wound Care: Routine skin checks 7. Fluids/Electrolytes/Nutrition:  Discussed intake with patient   Added megace to help stimulate appetite, increased on 6/14.   IVF d/ced on 6/12  Appetite appears to be slowly improving  Hypokalemia: 3.7 on 6/15 after supplementation 8. Seizure prophylaxis. Keppra 500 mg twice a day, valproic acid 500 mg daily. EEG negative 9. Hyponatremia.   Continue sodium chloride tablets for now.  Na 130 on 6/15 (stable range) 10. Heat rash  Continue hypoallergenic sheets  Patient bathing today 11. Likely UTI  UA positive for bacteria, U culture pending  Will start empiric Macrobid while awaiting sensitivities  LOS (Days) 6 A FACE TO FACE  EVALUATION WAS PERFORMED   Lorie Phenix 01/02/2016 9:31 AM

## 2016-01-02 NOTE — Progress Notes (Signed)
Occupational Therapy Session Note  Patient Details  Name: Kelly Wilson MRN: [id number] Date of Birth: [birth year]  Today's Date: 01/02/2016 OT Individual Time: 0705 - 0800 and  0900-0930 OT Individual Time Calculation (min): 55 min and 30 min    Short Term Goals: Week 1:  OT Short Term Goal 1 (Week 1): Pt will perform toilet transfer with min A in order to increase I with functional transfers. OT Short Term Goal 2 (Week 1): Pt will perform clothing management during toileting with steady assist for balance. OT Short Term Goal 3 (Week 1): Pt will perform UB dressing with min A in order to increase I with functional tasks.  OT Short Term Goal 4 (Week 1): Pt will perform toilet transfer with min A in order to increae I with functional transfers.   Skilled Therapeutic Interventions/Progress Updates:  Session 1: Upon entering the room, pt supine in bed reported 10/10 c/o pain described as headache but recently received medications. Pt required 15 minutes in order to initiate and complete transfer from supine >EOB with mod A needed. Pt performed transfer from bed >wheelchair with max A and use of RW. OT propelled pt into bathroom for mod A to transfer onto TTB for bathing with 6 minutes needed to initiate task. Pt requiring increased time and max multimodal cues for initiation, sequencing, and attention. Pt bathing at shower level while seated on TTB for safety. Pt returned to wheelchair and hospital gown donned secondary to time management. Quick release belt donned for safety. Call bell within reach.   Session 2: Upon entering the room, pt seated in wheelchair awaiting therapist with continued 10/10 pain and OT called RN for pain medication. Pt agreeable to getting dressed this session. Pt donned bra and pull over shirt with set up A and max verbal cues with 10 minutes needed to complete UB tasks. LB dressing performed pt needing total A to don elastic waist pants. Pt becoming very frustrated and  stating, "I can't do this now." Pt standing from wheelchair with mod A for and min A for standing balance while holding onto RW. Pt returned to wheelchair with quick release belt donned and call bell within reach upon exiting the room.   Therapy Documentation Precautions:  Precautions Precautions: Fall Restrictions Weight Bearing Restrictions: No  Pain: Pain Assessment Pain Assessment: 0-10 Pain Score: 7  Pain Type: Surgical pain Pain Location: Head Pain Descriptors / Indicators: Headache Pain Intervention(s): Medication (See eMAR)  See Function Navigator for Current Functional Status.   Therapy/Group: Individual Therapy  Phineas Semen 01/02/2016, 9:33 AM

## 2016-01-02 NOTE — Progress Notes (Signed)
Physical Therapy Session Note  Patient Details  Name: Kelly Wilson MRN: [id number] Date of Birth: 05-27-1956  Today's Date: 01/02/2016 PT Individual Time: 2217-9810 PT Individual Time Calculation (min): 32 min   Short Term Goals: Week 1:  PT Short Term Goal 1 (Week 1): Pt will perform bed mobility on flat bed with min A and min cues for initiation/sequencing PT Short Term Goal 2 (Week 1): Pt will perform stand pivot transfers with LRAD and min A and min cues for initiation/sequencing PT Short Term Goal 3 (Week 1): Pt will perform w/c mobility x 150' for endurance training with min A and 2 rest breaks PT Short Term Goal 4 (Week 1): Pt will ambulate 59' with LRAD with improved gait speed and min A and min cues for safety, sequencing and initiation PT Short Term Goal 5 (Week 1): Pt will negotiate 4 stairs with 2 rails and min A  Skilled Therapeutic Interventions/Progress Updates:    pt received sleeping in bed, easily awakes to voice, no c/o pain.  Session focus on attention, memory, and functional mobility.  Pt requires more than a reasonable amount of time and mod>total cues to complete all tasks. Supine>sit with mod assist to elevate trunk.  Attempted to don sock, pt able to bring LEs into figure 4 position sitting EOB but unable to problem solve donning socks even with total multimodal cues.  Focus on sitting tolerance/balance while engaged in orientation task, using external cues to orient to place and situation.  Squat/pivot from EOB>w/c with steady assist and pt left upright with QRB in place, call bell in reach, and needs met.   Therapy Documentation Precautions:  Precautions Precautions: Fall Restrictions Weight Bearing Restrictions: No   See Function Navigator for Current Functional Status.   Therapy/Group: Individual Therapy  Earnest Conroy Penven-Crew 01/02/2016, 3:34 PM

## 2016-01-03 ENCOUNTER — Inpatient Hospital Stay (HOSPITAL_COMMUNITY): Payer: BLUE CROSS/BLUE SHIELD | Admitting: Speech Pathology

## 2016-01-03 ENCOUNTER — Inpatient Hospital Stay (HOSPITAL_COMMUNITY): Payer: BLUE CROSS/BLUE SHIELD | Admitting: Physical Therapy

## 2016-01-03 ENCOUNTER — Inpatient Hospital Stay (HOSPITAL_COMMUNITY): Payer: BLUE CROSS/BLUE SHIELD | Admitting: *Deleted

## 2016-01-03 ENCOUNTER — Inpatient Hospital Stay (HOSPITAL_COMMUNITY): Payer: BLUE CROSS/BLUE SHIELD | Admitting: Occupational Therapy

## 2016-01-03 LAB — URINE CULTURE

## 2016-01-03 NOTE — Progress Notes (Signed)
Speech Language Pathology Daily Session Note  Patient Details  Name: Kelly Wilson MRN: [id number] Date of Birth: [birth year]  Today's Date: 01/03/2016 SLP Individual Time: 1330-1400 SLP Individual Time Calculation (min): 30 min  Short Term Goals: Week 1: SLP Short Term Goal 1 (Week 1): Patient will initiate functional tasks with Min A verbal cues.  SLP Short Term Goal 2 (Week 1): Patient will demonstrate functional problem solving for basic and familiar tasks with Min A verbal cues.  SLP Short Term Goal 3 (Week 1): Patient will utilize external memory aids to recall new, daily information with Min A question and verbal cues.  SLP Short Term Goal 4 (Week 1): Patient will utilize call bell to request assistance in 25% of observable opportunities.  SLP Short Term Goal 5 (Week 1): Patient will self-monitor and correct errors during functional tasks/verbal conversation with Min A question cues.  SLP Short Term Goal 6 (Week 1): Patient will demonstrate sustained attention to a functional task for ~30 minutes with Min A verbal cues for redirection.   Skilled Therapeutic Interventions: Skilled treatment session focused on cognitive goals. SLP facilitated session by providing Mod A verbal cues for initiation of functional tasks and allowing more than a reasonable amount of time for initiation. Pt required Max A verbal and tactile cues for functional problem solving for basic task. Pt was not able to self monitor or correct erros during tasks even with Max A verbal cues. Pt with increased simple conversation this session and able to verbally sequence steps to making fried chicken. Pt required Min A verbal cues for sustained attention to functional task for ~30 minutes. Pt returned to room and left in wheelchair with safety belt in place and all needs within reach. Continue current plan of care.   Function:  Eating Eating   Modified Consistency Diet: No Eating Assist Level: Supervision or verbal  cues   Eating Set Up Assist For: Opening containers       Cognition Comprehension Comprehension assist level: Understands basic 25 - 49% of the time/ requires cueing 50 - 75% of the time  Expression   Expression assist level: Expresses basis less than 25% of the time/requires cueing >75% of the time.  Social Interaction Social Interaction assist level: Interacts appropriately 25 - 49% of time - Needs frequent redirection.  Problem Solving Problem solving assist level: Solves basic 25 - 49% of the time - needs direction more than half the time to initiate, plan or complete simple activities  Memory Memory assist level: Recognizes or recalls 25 - 49% of the time/requires cueing 50 - 75% of the time    Pain    Therapy/Group: Individual Therapy   Rutherford Nail 01/03/2016, 4:37 PM

## 2016-01-03 NOTE — Progress Notes (Signed)
[place] PHYSICAL MEDICINE & REHABILITATION     PROGRESS NOTE    Subjective/Complaints: Pt up with PT. Brushing teeth at sink. No new issues other than headache when she awoke--seems to have resolved after being up for a few minutes.  ROS: Denies CP, COB, N/V/D.  Objective: Vital Signs: Blood pressure 129/77, pulse 62, temperature 98.6 F (37 C), temperature source Oral, resp. rate 18, height 5\' 6"  (1.676 m), weight 109.2 kg (240 lb 11.9 oz), SpO2 98 %. Ct Head Wo Contrast  01/01/2016  CLINICAL DATA:  Lethargy EXAM: CT HEAD WITHOUT CONTRAST TECHNIQUE: Contiguous axial images were obtained from the base of the skull through the vertex without intravenous contrast. COMPARISON:  12/26/2015 FINDINGS: Again noted posterior craniotomy with prior aneurysm clipping. Ventricular size is stable from prior exam. No intracranial hemorrhage, mass effect or midline shift. The previous intraventricular hemorrhage in lateral ventricle has resolved. No acute cortical infarction. No definite hemorrhage in fourth ventricle. No new hemorrhage is noted. Mild cerebral atrophy is stable. IMPRESSION: 1. Stable postsurgical changes post posterior craniotomy and aneurysmal clipping. No intracranial hemorrhage, mass effect or midline shift. No acute cortical infarction. No intraventricular hemorrhage. Mild cerebral atrophy is stable. Electronically Signed   By: Lahoma Crocker M.D.   On: 01/01/2016 15:12    Recent Labs  01/02/16 0534  WBC 8.5  HGB 13.5  HCT 39.2  PLT 340    Recent Labs  01/02/16 0534  NA 130*  K 3.7  CL 97*  GLUCOSE 97  BUN 13  CREATININE 0.71  CALCIUM 9.1   CBG (last 3)  No results for input(s): GLUCAP in the last 72 hours.  Wt Readings from Last 3 Encounters:  01/01/16 109.2 kg (240 lb 11.9 oz)  12/16/15 108.863 kg (240 lb)  11/05/15 114.306 kg (252 lb)    Physical Exam:  Constitutional: She appears well-developed. No distress. Obese  HENT: Normocephalic and atraumatic.  Eyes:  EOM and Conj are normal.  Neck: Normal range of motion. Neck supple. No JVD present. No tracheal deviation present. No thyromegaly present.  Posterior head incision intact  Cardiovascular: Normal rate and regular rhythm.  Respiratory: Effort normal and breath sounds normal. No respiratory distress.  GI: Soft. Bowel sounds are normal. She exhibits no distension.  Neurological: She is alert and oriented.    Has difficulty shifting between tasks.  Strength 4+/5 bilateral upper ext and 4+/5 HF, 4+/5 KE and 5/5 ADFPF.  Skin: She is not diaphoretic. Surgical incision intact. Red/macular rash on back, improving.  Psychiatric: She has a flat affect.      Assessment/Plan: 1. Impaired functional mobility and self-care secondary to IVH/hydrocephalus which require 3+ hours per day of interdisciplinary therapy in a comprehensive inpatient rehab setting. Physiatrist is providing close team supervision and 24 hour management of active medical problems listed below. Physiatrist and rehab team continue to assess barriers to discharge/monitor patient progress toward functional and medical goals.  Function:  Bathing Bathing position   Position: Shower  Bathing parts Body parts bathed by patient: Chest, Abdomen, Front perineal area, Left upper leg, Right upper leg Body parts bathed by helper: Right arm, Left arm, Buttocks, Right lower leg, Left lower leg, Back  Bathing assist Assist Level:  (mod A)      Upper Body Dressing/Undressing Upper body dressing   What is the patient wearing?: Pull over shirt/dress Bra - Perfomed by patient: Thread/unthread right bra strap, Thread/unthread left bra strap, Hook/unhook bra (pull down sports bra)   Pull over  shirt/dress - Perfomed by patient: Thread/unthread right sleeve, Thread/unthread left sleeve, Put head through opening, Pull shirt over trunk Pull over shirt/dress - Perfomed by helper: Put head through opening, Pull shirt over trunk        Upper body  assist Assist Level: Set up, Supervision or verbal cues   Set up : To obtain clothing/put away  Lower Body Dressing/Undressing Lower body dressing   What is the patient wearing?: Non-skid slipper socks, Pants, Ted Hose     Pants- Performed by patient: Thread/unthread right pants leg, Thread/unthread left pants leg, Pull pants up/down Pants- Performed by helper: Thread/unthread right pants leg, Thread/unthread left pants leg, Pull pants up/down Non-skid slipper socks- Performed by patient: Don/doff right sock, Don/doff left sock Non-skid slipper socks- Performed by helper: Don/doff right sock, Don/doff left sock     Shoes - Performed by patient: Don/doff right shoe, Don/doff left shoe Shoes - Performed by helper: Fasten right, Fasten left       TED Hose - Performed by helper: Don/doff right TED hose, Don/doff left TED hose  Lower body assist Assist for lower body dressing: Touching or steadying assistance (Pt > 75%)      Toileting Toileting   Toileting steps completed by patient: Performs perineal hygiene Toileting steps completed by helper: Adjust clothing prior to toileting, Adjust clothing after toileting Toileting Assistive Devices: Grab bar or rail  Toileting assist Assist level: Touching or steadying assistance (Pt.75%)   Transfers Chair/bed transfer   Chair/bed transfer method: Stand pivot Chair/bed transfer assist level: Touching or steadying assistance (Pt > 75%) Chair/bed transfer assistive device: Armrests, Medical sales representative     Max distance: 25 Assist level: Touching or steadying assistance (Pt > 75%)   Wheelchair   Type: Manual Max wheelchair distance: 150 Assist Level: Touching or steadying assistance (Pt > 75%)  Cognition Comprehension Comprehension assist level: Understands basic 25 - 49% of the time/ requires cueing 50 - 75% of the time  Expression Expression assist level: Expresses basis less than 25% of the time/requires cueing >75% of  the time.  Social Interaction Social Interaction assist level: Interacts appropriately 25 - 49% of time - Needs frequent redirection.  Problem Solving Problem solving assist level: Solves basic 25 - 49% of the time - needs direction more than half the time to initiate, plan or complete simple activities  Memory Memory assist level: Recognizes or recalls 25 - 49% of the time/requires cueing 50 - 75% of the time   Medical Problem List and Plan: 1. 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 DVT Prophylaxis/Anticoagulation: Subcutaneous Lovenox initiated 12/22/2015. Monitor for any bleeding episodes 3. Pain Management: Neurontin 900 mg every 8 hours.  4. Hypertension. Maxide daily,Nimotop 60 mg every 4 hours 21 days initiated 12/17/2015  Will cont to monitor, overall controlled 5. Neuropsych: This patient is capable of making decisions on her own behalf. 6. Skin/Wound Care: Routine skin checks 7. Fluids/Electrolytes/Nutrition:  Discussed intake with patient   Added megace to help stimulate appetite .   IVF d/ced on 6/12  Appetite appears to be slowly improving  Hypokalemia: 3.7 on 6/15 after supplementation 8. Seizure prophylaxis. Keppra 500 mg twice a day, valproic acid 500 mg daily. EEG negative 9. Hyponatremia.   Continue sodium chloride tablets for now.  Na 130 on 6/15 (stable range) 10. Heat rash--some improvement  Continue hypoallergenic sheets    11. ? UTI  UA positive for bacteria, U culture multi-species  Continue macrobid for now  and re-collect for culture  LOS (Days) 7 A FACE TO FACE EVALUATION WAS PERFORMED  , T 01/03/2016 9:51 AM

## 2016-01-03 NOTE — Progress Notes (Signed)
Speech Language Pathology Weekly Progress and Session Note  Patient Details  Name: Kelly Wilson MRN: [id number] Date of Birth: June 05, 1956  Beginning of progress report period: December 27, 2015 End of progress report period: January 03, 2016  Today's Date: 01/03/2016 SLP Individual Time: 1100-1200 SLP Individual Time Calculation (min): 60 min  Short Term Goals: Week 1: SLP Short Term Goal 1 (Week 1): Patient will initiate functional tasks with Min A verbal cues.  SLP Short Term Goal 1 - Progress (Week 1): Not met SLP Short Term Goal 2 (Week 1): Patient will demonstrate functional problem solving for basic and familiar tasks with Min A verbal cues.  SLP Short Term Goal 2 - Progress (Week 1): Not met SLP Short Term Goal 3 (Week 1): Patient will utilize external memory aids to recall new, daily information with Min A question and verbal cues.  SLP Short Term Goal 3 - Progress (Week 1): Not met SLP Short Term Goal 4 (Week 1): Patient will utilize call bell to request assistance in 25% of observable opportunities.  SLP Short Term Goal 4 - Progress (Week 1): Not met SLP Short Term Goal 5 (Week 1): Patient will self-monitor and correct errors during functional tasks/verbal conversation with Min A question cues.  SLP Short Term Goal 5 - Progress (Week 1): Not met SLP Short Term Goal 6 (Week 1): Patient will demonstrate sustained attention to a functional task for ~30 minutes with Min A verbal cues for redirection.  SLP Short Term Goal 6 - Progress (Week 1): Not met    New Short Term Goals: Week 2: SLP Short Term Goal 1 (Week 2): Patient will initiate functional tasks with Mod A verbal cues.  SLP Short Term Goal 2 (Week 2): Patient will demonstrate functional problem solving for basic and familiar tasks with Mod A verbal cues.  SLP Short Term Goal 3 (Week 2): Patient will utilize external memory aids to recall new, daily information with Mod A question and verbal cues.  SLP Short Term Goal 4 (Week  2): Patient will utilize call bell to request assistance in 25% of observable opportunities with Max A multimodal cues.   SLP Short Term Goal 5 (Week 2): Patient will self-monitor and correct errors during functional tasks/verbal conversation with Mod A question cues.  SLP Short Term Goal 6 (Week 2): Patient will demonstrate sustained attention to a functional task for ~15 minutes with Mod A verbal cues for redirection.   Weekly Progress Updates: Patient has made minimal progress this reporting period and has not met any STG's. Currently, patient's overall cognitive function fluctuates greatly throughout the day and she requires overall Max-Total A multimodal cues to complete functional tasks safely in regards to initiation, attention, problem solving, awareness and recall. Patient and family education is ongoing. Patient would benefit from continued skilled SLP intervention to maximize her cognitive function and overall functional independence prior to discharge home.    Intensity: Minumum of 1-2 x/day, 30 to 90 minutes Frequency: 3 to 5 out of 7 days Duration/Length of Stay: 01/15/16 Treatment/Interventions: Cognitive remediation/compensation;Cueing hierarchy;Functional tasks;Patient/family education;Therapeutic Activities;Internal/external aids;Environmental controls   Daily Session  Skilled Therapeutic Interventions: Skilled treatment session focused on cognitive goals. Upon arrival, patient was asleep while sitting upright in the wheelchair and required Max A verbal and tactile cues for alertness. Patient was initially slow to respond but became more verbal as the session progressed, however, patient demonstrated severe language of confusion this session with no awareness despite Max A multimodal cues. Patient participated  in a basic verbal description task and required Max-Total A due to language of confusion. Patient declined her lunch tray but consumed a fruit cup with Min A verbal cues for  initiation. Patient attended to self-feeding for ~10 minutes in a mildly distracting environment with supervision cues. Once in room, patient initiated reading cards and independently noticed a new vase of flowers and requested to read the card. Patient decoded at the sentence level with Mod I. Patient requested to sit up in chair and was left with quick release belt in place and all needs within reach. Continue with current plan of care.     Function:   Eating Eating   Modified Consistency Diet: No Eating Assist Level: Supervision or verbal cues;More than reasonable amount of time;Set up assist for   Eating Set Up Assist For: Opening containers       Cognition Comprehension Comprehension assist level: Understands basic 50 - 74% of the time/ requires cueing 25 - 49% of the time  Expression   Expression assist level: Expresses basis less than 25% of the time/requires cueing >75% of the time.  Social Interaction Social Interaction assist level: Interacts appropriately 50 - 74% of the time - May be physically or verbally inappropriate.  Problem Solving Problem solving assist level: Solves basic 25 - 49% of the time - needs direction more than half the time to initiate, plan or complete simple activities  Memory Memory assist level: Recognizes or recalls 25 - 49% of the time/requires cueing 50 - 75% of the time   Pain No reports of pain   Therapy/Group: Individual Therapy  ,  01/03/2016, 4:58 PM

## 2016-01-03 NOTE — Progress Notes (Signed)
Occupational Therapy Session Note  Patient Details  Name: Kelly Wilson MRN: [id number] Date of Birth: [birth year]  Today's Date: 01/03/2016 OT Individual Time: 1405-1505 OT Individual Time Calculation (min): 60 min   Short Term Goals: Week 1:  OT Short Term Goal 1 (Week 1): Pt will perform toilet transfer with min A in order to increase I with functional transfers. OT Short Term Goal 2 (Week 1): Pt will perform clothing management during toileting with steady assist for balance. OT Short Term Goal 3 (Week 1): Pt will perform UB dressing with min A in order to increase I with functional tasks.  OT Short Term Goal 4 (Week 1): Pt will perform toilet transfer with min A in order to increae I with functional transfers.   Skilled Therapeutic Interventions/Progress Updates:  Upon entering room pt found in BR with NT present. Pt with headache, no rate given - monitored during session. Pt stood with increased time and min/mod assist from therapist, therapist assisted with adjusting brief and pants. Therapist then assisted pt out of BR via w/c and pt performed grooming tasks of washing hands and brushing teeth seated at sink, mod cueing needed for problem solving and pt tended to perseverate on brushing teeth taking increased time. Patient's parents present at this time. Pt then propelled self from room to therapy gym with mod verbal cues for safety and overall awareness. Once in therapy gym pt engaged in therapeutic activity focusing on sit to/from stands, dynamic standing balance/tolerance/endurance, problem solving, initiation. Pt engaged in meal planning activity, throwing food item bean bags to the appropriate meal time (breakfast vs lunch vs snack/dessert vs dinner). During this activity, pt required increased time and cues for initiation, problem solving, and memory. Educated pt on her room number multiple times during session, pt able to provide immediate recall, but unable to state room number from  [facility]. Pt with decreased topographical orientation, even with verbal cueing. At end of session, left pt seated in w/c with quick release belt donned, all needs within reach, and mother & father present in room. Patient's mother came to therapy session with her and was pleased with her progress so far.   Therapy Documentation Precautions:  Precautions Precautions: Fall Restrictions Weight Bearing Restrictions: No  See Function Navigator for Current Functional Status.  Therapy/Group: Individual Therapy  [name] , MS, OTR/L, CLT  01/03/2016, 3:23 PM

## 2016-01-03 NOTE — Progress Notes (Signed)
Physical Therapy Session Note  Patient Details  Name: Kelly Wilson MRN: [id number] Date of Birth: 09/13/1955  Today's Date: 01/03/2016 PT Individual Time: 0800-0900 PT Individual Time Calculation (min): 60 min   Short Term Goals: Week 1:  PT Short Term Goal 1 (Week 1): Pt will perform bed mobility on flat bed with min A and min cues for initiation/sequencing PT Short Term Goal 2 (Week 1): Pt will perform stand pivot transfers with LRAD and min A and min cues for initiation/sequencing PT Short Term Goal 3 (Week 1): Pt will perform w/c mobility x 150' for endurance training with min A and 2 rest breaks PT Short Term Goal 4 (Week 1): Pt will ambulate 108' with LRAD with improved gait speed and min A and min cues for safety, sequencing and initiation PT Short Term Goal 5 (Week 1): Pt will negotiate 4 stairs with 2 rails and min A  Skilled Therapeutic Interventions/Progress Updates:   Session focused on initiation, active participation in functional tasks, standing balance, and functional mobility. Patient in bed upon arrival, reporting increased headache pain. Patient donned socks semi reclined in bed with setup and more than reasonable time x 12 min. Patient sat edge of bed to take pain medications with HOB raised and use of rail with supervision and more than reasonable time x 10 min. Patient stood from edge of bed with cues for safe hand placement and ambulated to/from bathroom using RW with close supervision-steady assist and mod verbal cues for sequencing backing up to toilet. Patient required total A to doff soiled/don clean brief and performed hygiene with setup assist after voiding on toilet. Patient stood at sink with supervision for hand hygiene and oral care x 5 min. Patient returned to sitting EOB for UB/LB dressing with setup assist/supervision and steady assist for sit > stand to pull pants over hips. Patient elected to stay up in wheelchair after session. Patient performed stand pivot  transfer from bed to wheelchair using RW with close supervision and verbal cues for hand placement. Patient left sitting in wheelchair with quick release belt and needs in reach.   Therapy Documentation Precautions:  Precautions Precautions: Fall Restrictions Weight Bearing Restrictions: No Vital Signs: Therapy Vitals BP: 129/77 mmHg Pain: Pain Assessment Pain Assessment: 0-10 Pain Score: 10-Worst pain ever Pain Type: Acute pain Pain Location: Head Pain Descriptors / Indicators: Headache;Grimacing Pain Frequency: Intermittent Pain Onset: On-going Patients Stated Pain Goal: 3 Pain Intervention(s): RN made aware   See Function Navigator for Current Functional Status.   Therapy/Group: Individual Therapy  Laretta Alstrom 01/03/2016, 9:01 AM

## 2016-01-04 ENCOUNTER — Inpatient Hospital Stay (HOSPITAL_COMMUNITY): Payer: BLUE CROSS/BLUE SHIELD | Admitting: Speech Pathology

## 2016-01-04 ENCOUNTER — Inpatient Hospital Stay (HOSPITAL_COMMUNITY): Payer: BLUE CROSS/BLUE SHIELD | Admitting: Physical Therapy

## 2016-01-04 LAB — URINE CULTURE

## 2016-01-04 NOTE — Progress Notes (Signed)
[place] PHYSICAL MEDICINE & REHABILITATION     PROGRESS NOTE    Subjective/Complaints: No complaints other than intermittent headache- this is not new  Objective: Vital Signs: Blood pressure 130/64, pulse 60, temperature 98 F (36.7 C), temperature source Oral, resp. rate 16, height 5\' 6"  (1.676 m), weight 240 lb 11.9 oz (109.2 kg), SpO2 98 %.  Physical Exam:  nad Chest- cta cv- reg rate abd- soft nt nd Ext No edema   Assessment/Plan: 1. Impaired functional mobility and self-care secondary to IVH/hydrocephalus    Medical Problem List and Plan: 1. 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 DVT Prophylaxis/Anticoagulation: Subcutaneous Lovenox initiated 12/22/2015.  3. Pain Management: Neurontin .  4. Hypertension: 119/61-130/64 5. Neuropsych: This patient is capable of making decisions on her own behalf. 6. Skin/Wound Care: Routine skin checks 7. Fluids/Electrolytes/Nutrition:   8. Seizure prophylaxis. Keppra 500 mg twice a day, valproic acid 500 mg daily. EEG negative 9. Hyponatremiia: Basic Metabolic Panel:    Component Value Date/Time   NA 130* 01/02/2016 0534   NA 140 11/14/2014 1005   K 3.7 01/02/2016 0534   CL 97* 01/02/2016 0534   CO2 23 01/02/2016 0534   BUN 13 01/02/2016 0534   BUN 23 11/14/2014 1005   CREATININE 0.71 01/02/2016 0534   CREATININE 0.91 08/03/2013 0754   GLUCOSE 97 01/02/2016 0534   GLUCOSE 93 11/14/2014 1005   CALCIUM 9.1 01/02/2016 0534    10. Heat rash--improved    11. ? UTI  UA positive for bacteria, U culture multi-species  Continue macrobid for now and re-collect for culture  LOS (Days) 8 A FACE TO FACE EVALUATION WAS PERFORMED  Bruce H Swords 01/04/2016 8:57 AM

## 2016-01-04 NOTE — Progress Notes (Signed)
Speech Language Pathology Daily Session Note  Patient Details  Name: Kelly Wilson MRN: [id number] Date of Birth: 05-Apr-1956  Today's Date: 01/04/2016 SLP Individual Time: 1115-1200 SLP Individual Time Calculation (min): 45 min  Short Term Goals: Week 2: SLP Short Term Goal 1 (Week 2): Patient will initiate functional tasks with Mod A verbal cues.  SLP Short Term Goal 2 (Week 2): Patient will demonstrate functional problem solving for basic and familiar tasks with Mod A verbal cues.  SLP Short Term Goal 3 (Week 2): Patient will utilize external memory aids to recall new, daily information with Mod A question and verbal cues.  SLP Short Term Goal 4 (Week 2): Patient will utilize call bell to request assistance in 25% of observable opportunities with Max A multimodal cues.   SLP Short Term Goal 5 (Week 2): Patient will self-monitor and correct errors during functional tasks/verbal conversation with Mod A question cues.  SLP Short Term Goal 6 (Week 2): Patient will demonstrate sustained attention to a functional task for ~15 minutes with Mod A verbal cues for redirection.   Skilled Therapeutic Interventions: Skilled treatment session focused on cognition goals. SLP facilitated session by providing Max A multimodal cues and max encouragement to participate in therapy tasks. Despite SLP facilitated, pt with no initiation towards task of setting up for lunch or consuming lunch (multiple food items and drinks options offered). Pt repeatedly closed her eyes and stated "I don't want it, I don't want it, do I have to." Pt's brother present and provided max support but pt unable to engage in any therapy task. Pt finally agreed to game of checkers but when presented with game, she took out 2 checkers and stated that she was finished with all therapy tasks d/t headache. SLP and pt retrieved nurse and pt able to express to nurse that she had headache. Nurse administered medication and pt left in dayroom with  brother. Continue plan of care.   Function:  Eating Eating   Modified Consistency Diet: No Eating Assist Level: Supervision or verbal cues;More than reasonable amount of time;Set up assist for   Eating Set Up Assist For: Opening containers       Cognition Comprehension Comprehension assist level: Understands basic 50 - 74% of the time/ requires cueing 25 - 49% of the time  Expression   Expression assist level: Expresses basic 25 - 49% of the time/requires cueing 50 - 75% of the time. Uses single words/gestures.  Social Interaction Social Interaction assist level: Interacts appropriately 50 - 74% of the time - May be physically or verbally inappropriate.  Problem Solving Problem solving assist level: Solves basic 25 - 49% of the time - needs direction more than half the time to initiate, plan or complete simple activities  Memory Memory assist level: Recognizes or recalls 25 - 49% of the time/requires cueing 50 - 75% of the time    Pain Pain Assessment Pain Score: Asleep  Therapy/Group: Individual Therapy    01/04/2016, 3:53 PM

## 2016-01-04 NOTE — Progress Notes (Signed)
Physical Therapy Session Note  Patient Details  Name: Kelly Wilson MRN: [id number] Date of Birth: 1955-08-06  Today's Date: 01/04/2016 PT Individual Time: 1000-1045 PT Individual Time Calculation (min): 45 min   Short Term Goals: Week 1:  PT Short Term Goal 1 (Week 1): Pt will perform bed mobility on flat bed with min A and min cues for initiation/sequencing PT Short Term Goal 2 (Week 1): Pt will perform stand pivot transfers with LRAD and min A and min cues for initiation/sequencing PT Short Term Goal 3 (Week 1): Pt will perform w/c mobility x 150' for endurance training with min A and 2 rest breaks PT Short Term Goal 4 (Week 1): Pt will ambulate 69' with LRAD with improved gait speed and min A and min cues for safety, sequencing and initiation PT Short Term Goal 5 (Week 1): Pt will negotiate 4 stairs with 2 rails and min A    Skilled Therapeutic Interventions/Progress Updates:    Pt reports being limited by headache 10/10 in session that pt says improves post manual techniques though pain still 10/10. Pt with difficulty attending to task and with multi-step commands limiting ability to benefit from vestibular adaptation strategies and advance duration of time in upright. Pt would continue to benefit from skilled PT services to increase functional mobility.  Therapy Documentation Precautions:  Precautions Precautions: Fall Restrictions Weight Bearing Restrictions: No Pain: Pain Assessment Pain Assessment: 0-10 Pain Score: 10-Worst pain ever Pain Location: Head Mobility:  Transfer Min A with cues for weight shift, safety, and sequencing Locomotion :   Pt performs gait with cues for sequencing, safety, and weight shift 15', 5'x2 Other Treatments:  Pt educated on rehab plan, safety in mobility, progressing mobility, vestibular adaptation strategies, and pain science. Pt performs transfers x5 in session. Pt performs marching, hip ER, hip ER, LAQs, ankle pumps x10. Pt performs  sitting EOB as active rest. Pt performs static standing 30"x2. Pt given frequent cog rests, decreased demands, and education throughout. Gastroc stretch 2x30"   See Function Navigator for Current Functional Status.   Therapy/Group: Individual Therapy  Monia Pouch 01/04/2016, 10:08 AM

## 2016-01-05 ENCOUNTER — Inpatient Hospital Stay (HOSPITAL_COMMUNITY): Payer: BLUE CROSS/BLUE SHIELD | Admitting: Occupational Therapy

## 2016-01-05 NOTE — Progress Notes (Signed)
Pt refusing meals as well as nutrition supplements. Currently on Megace. Nurse explained the importance of nutrition as it relates to nourishment and healing, however pt  continue to refuse.

## 2016-01-05 NOTE — Progress Notes (Signed)
Occupational Therapy Session Note  Patient Details  Name: Kelly Wilson MRN: [id number] Date of Birth: 27-May-1956  Today's Date: 01/05/2016 OT Individual Time: 0900-1000 OT Individual Time Calculation (min): 60 min    Short Term Goals: Week 1:  OT Short Term Goal 1 (Week 1): Pt will perform toilet transfer with min A in order to increase I with functional transfers. OT Short Term Goal 2 (Week 1): Pt will perform clothing management during toileting with steady assist for balance. OT Short Term Goal 3 (Week 1): Pt will perform UB dressing with min A in order to increase I with functional tasks.  OT Short Term Goal 4 (Week 1): Pt will perform toilet transfer with min A in order to increae I with functional transfers.  :        Skilled Therapeutic Interventions/Progress Updates:     No complainof pain at beginning of session.  Pt lying in bed with husband present at beginning of session.  Pt agreed to shower.  Addressed bed mobility, transitional movements, functional mobility, problem solving, standing balance.  Pt. Needed increased time to come from lying to sitting.  She need SBA and cues for placement.  Sat EOB and problem solved what articles of clothing she needed.  Used RW and ambulated to shower and transferred with mod assist with max cues to keep weight on front of her forefoot.  Pt with tendancy to fall backward when walking and standing.  Pt problem solved operating shower controls with questioning cues.  She stated while showering she got a headache.  Completed shower and transferred to wc with mod assist.  Dressed EOB with ppt growing mor fatigue and minimally agitated with performing activity.    Ppt combed 25 % of her hair and could not tolerate hair being combed for her.  She needed max assist to sit EOB the last 10 minutes with constantly leaning backward.  OT cued her to contract through abdominal region to maintain control.  Pt returning to supine to don pants due to fatigue  and unable to stand anymore with OT.  Remained in bed with bed alarm on and call bell,phone within reach.    Therapy Documentation Precautions:  Precautions Precautions: Fall Restrictions Weight Bearing Restrictions: No    Pain:  Initially none but progressed to 5/10 head ache near end of session       See Function Navigator for Current Functional Status.   Therapy/Group: Individual Therapy  Lisa Roca 01/05/2016, 12:41 PM

## 2016-01-05 NOTE — Progress Notes (Signed)
Kelly Wilson PHYSICAL MEDICINE & REHABILITATION     PROGRESS NOTE    Subjective/Complaints: No complaints Headache resolved  Objective: Vital Signs: Blood pressure 126/78, pulse 63, temperature 97.7 F (36.5 C), temperature source Oral, resp. rate 18, height 5\' 6"  (1.676 m), weight 240 lb 11.9 oz (109.2 kg), SpO2 99 %.  Physical Exam:  nad Chest- cta cv- reg rate abd- soft nt nd Ext No edema   Assessment/Plan: 1. Impaired functional mobility and self-care secondary to IVH/hydrocephalus    Medical Problem List and Plan: 1. 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 DVT Prophylaxis/Anticoagulation: Subcutaneous Lovenox initiated 12/22/2015.  3. Pain Management: Neurontin .  4. Hypertension: 126/78-148/72 5. Neuropsych: This patient is capable of making decisions on her own behalf. 6. Skin/Wound Care: Routine skin checks 7. Fluids/Electrolytes/Nutrition:   8. Seizure prophylaxis. Keppra 500 mg twice a day, valproic acid 500 mg daily. EEG negative 9. Hyponatremiia: Basic Metabolic Panel:    Component Value Date/Time   NA 130* 01/02/2016 0534   NA 140 11/14/2014 1005   K 3.7 01/02/2016 0534   CL 97* 01/02/2016 0534   CO2 23 01/02/2016 0534   BUN 13 01/02/2016 0534   BUN 23 11/14/2014 1005   CREATININE 0.71 01/02/2016 0534   CREATININE 0.91 08/03/2013 0754   GLUCOSE 97 01/02/2016 0534   GLUCOSE 93 11/14/2014 1005   CALCIUM 9.1 01/02/2016 0534    10. Heat rash--improved    11. ? UTI  UA positive for bacteria, U culture multi-species  Continue macrobid for now and re-collect for culture  LOS (Days) 9 A FACE TO FACE EVALUATION WAS PERFORMED   H  01/05/2016 8:38 AM

## 2016-01-06 ENCOUNTER — Inpatient Hospital Stay (HOSPITAL_COMMUNITY): Payer: BLUE CROSS/BLUE SHIELD | Admitting: Occupational Therapy

## 2016-01-06 ENCOUNTER — Inpatient Hospital Stay (HOSPITAL_COMMUNITY): Payer: BLUE CROSS/BLUE SHIELD | Admitting: Physical Therapy

## 2016-01-06 ENCOUNTER — Inpatient Hospital Stay (HOSPITAL_COMMUNITY): Payer: BLUE CROSS/BLUE SHIELD | Admitting: Speech Pathology

## 2016-01-06 DIAGNOSIS — R001 Bradycardia, unspecified: Secondary | ICD-10-CM

## 2016-01-06 MED ORDER — PROPRANOLOL HCL 10 MG PO TABS
5.0000 mg | ORAL_TABLET | Freq: Three times a day (TID) | ORAL | Status: DC
  Administered 2016-01-06 – 2016-01-08 (×6): 5 mg via ORAL
  Filled 2016-01-06: qty 0.5
  Filled 2016-01-06: qty 1
  Filled 2016-01-06 (×2): qty 0.5
  Filled 2016-01-06: qty 1
  Filled 2016-01-06: qty 0.5
  Filled 2016-01-06: qty 1

## 2016-01-06 NOTE — Progress Notes (Signed)
Physical Therapy Weekly Progress Note  Patient Details  Name: Kelly Wilson MRN: [id number] Date of Birth: 1955/10/03  Beginning of progress report period: December 28, 2015 End of progress report period: January 06, 2016  Today's Date: 01/06/2016 PT Individual Time: 1100-1200 PT Individual Time Calculation (min): 60 min   Patient has met 5 of 5 short term goals.  Pt has made extensive progress regarding mobility and cognitive goals in the last several days.  She is more alert, demonstrates improved orientation and memory, and is required decreased assist and cues compared to initial evaluation.    Patient continues to demonstrate the following deficits: midline orientation, postural control, balance, strength, muscle activation, attention, and apraxia and therefore will continue to benefit from skilled PT intervention to enhance overall performance with activity tolerance, balance, postural control, attention, awareness and coordination.  Patient progressing toward long term goals..  Continue plan of care.  PT Short Term Goals Week 1:  PT Short Term Goal 1 (Week 1): Pt will perform bed mobility on flat bed with min A and min cues for initiation/sequencing PT Short Term Goal 1 - Progress (Week 1): Met PT Short Term Goal 2 (Week 1): Pt will perform stand pivot transfers with LRAD and min A and min cues for initiation/sequencing PT Short Term Goal 2 - Progress (Week 1): Met PT Short Term Goal 3 (Week 1): Pt will perform w/c mobility x 150' for endurance training with min A and 2 rest breaks PT Short Term Goal 3 - Progress (Week 1): Met PT Short Term Goal 4 (Week 1): Pt will ambulate 60' with LRAD with improved gait speed and min A and min cues for safety, sequencing and initiation PT Short Term Goal 4 - Progress (Week 1): Met PT Short Term Goal 5 (Week 1): Pt will negotiate 4 stairs with 2 rails and min A PT Short Term Goal 5 - Progress (Week 1): Met Week 2:  PT Short Term Goal 1 (Week 2):  =LTGs  Skilled Therapeutic Interventions/Progress Updates:    Pt received resting upright in w/c, no c/o pain, and agreeable to therapy session.  Pt reports she can recall this therapist's face from last week but does not recall name.  Session focus on mobility goals, cognitive goals, and attention.    Pt propelled w/c with BLEs for NMR to [facility] gym with increased time and supervision.    Pt performed 3 trials on dynavision for standing tolerance, balance, attention, and scanning.  Pt performed mode A x2 trials with 16 hits and 3.75 second reaction time improving to 21 hits and 2.86 second reaction time.  Attempted to transition to mode B (challenge scanning and reaction time), but pt c/o nausea and required seated rest break.  PT provided education regarding brain injury recovery and importance of allowing brain time to rest and pt verbalized understanding.  PT instructed pt in negotiation of 4 steps with 2 handrails x2 trials with seated rest break in between.  Pt required supervision and min/mod verbal cues to ascend/descend steps.  Pt reporting dizziness with turning at the top of the stairs and PT instructed pt in first turning head and focusing on target before turning body.  Pt reports improvement in dizziness with spotting technique.  PT also discussed vestibular eval with pt.    Gait training x200' back to pt's room with initial close supervision fade to min assist with fatigue.  Pt demonstrates increased L-posterior lean with RLE weight bearing in stance phase.  Pt seated in w/c at end of session with QRB in place, call bell in reach, and needs met.  Therapy Documentation Precautions:  Precautions Precautions: Fall Restrictions Weight Bearing Restrictions: No   See Function Navigator for Current Functional Status.  Therapy/Group: Individual Therapy  Earnest Conroy Penven-Crew 01/06/2016, 12:16 PM

## 2016-01-06 NOTE — Progress Notes (Signed)
Speech Language Pathology Daily Session Note  Patient Details  Name: Kelly Wilson MRN: [id number] Date of Birth: Aug 16, 1955  Today's Date: 01/06/2016 SLP Individual Time: 1000-1030 SLP Individual Time Calculation (min): 30 min  Short Term Goals: Week 2: SLP Short Term Goal 1 (Week 2): Patient will initiate functional tasks with Mod A verbal cues.  SLP Short Term Goal 2 (Week 2): Patient will demonstrate functional problem solving for basic and familiar tasks with Mod A verbal cues.  SLP Short Term Goal 3 (Week 2): Patient will utilize external memory aids to recall new, daily information with Mod A question and verbal cues.  SLP Short Term Goal 4 (Week 2): Patient will utilize call bell to request assistance in 25% of observable opportunities with Max A multimodal cues.   SLP Short Term Goal 5 (Week 2): Patient will self-monitor and correct errors during functional tasks/verbal conversation with Mod A question cues.  SLP Short Term Goal 6 (Week 2): Patient will demonstrate sustained attention to a functional task for ~15 minutes with Mod A verbal cues for redirection.   Skilled Therapeutic Interventions: Skilled treatment session focused on cognitive goals. SLP facilitated session by providing Max A verbal and question cues for recall of her current medications and their functions with an external aid created to maximize recall. Will organize a QD pill box at next visit. Patient reported she could not find her cell phone and was Mod I for problem solving in regards to calling it from her room phone. Patient required more than a reasonable amount of time and Max A multimodal cues to navigate her phone and locate specific information. Suspect function impacted by decreased recall.  Overall, patient appeared more alert and engaging this session. Patient left upright in wheelchair with quick release belt in place and all needs within reach. Continue with current plan of care.     Function:  Cognition Comprehension Comprehension assist level: Understands basic 50 - 74% of the time/ requires cueing 25 - 49% of the time  Expression   Expression assist level: Expresses basic 50 - 74% of the time/requires cueing 25 - 49% of the time. Needs to repeat parts of sentences.  Social Interaction Social Interaction assist level: Interacts appropriately 50 - 74% of the time - May be physically or verbally inappropriate.  Problem Solving Problem solving assist level: Solves basic 50 - 74% of the time/requires cueing 25 - 49% of the time  Memory Memory assist level: Recognizes or recalls 50 - 74% of the time/requires cueing 25 - 49% of the time    Pain No/Denies Pain   Therapy/Group: Individual Therapy  ,  01/06/2016, 12:41 PM

## 2016-01-06 NOTE — Progress Notes (Signed)
Occupational Therapy Weekly Progress Note  Patient Details  Name: Kelly Wilson MRN: [id number] Date of Birth: June 02, 1956  Beginning of progress report period: December 28, 2015 End of progress report period: January 06, 2016  Today's Date: 01/06/2016 OT Individual Time: 0803-0900 OT Individual Time Calculation (min): 57 min    Patient has met 1 of 4 short term goals. Pt had many barriers last week towards progress with therapy goals including severe headaches, mental fatigue, agitation, and increased confusion during therapy sessions. However, pt is progressing towards long term goals and continues to benefit from OT intervention.   Patient continues to demonstrate the following deficits: decreased I in self care, balance, functional transfers, mobility, apraxia, cognition, and safety and therefore will continue to benefit from skilled OT intervention to enhance overall performance with BADL and iADL.  Patient progressing toward long term goals..  Continue plan of care.  OT Short Term Goals Week 1:  OT Short Term Goal 1 (Week 1): Pt will perform toilet transfer with min A in order to increase I with functional transfers. OT Short Term Goal 1 - Progress (Week 1): Progressing toward goal OT Short Term Goal 2 (Week 1): Pt will perform clothing management during toileting with steady assist for balance. OT Short Term Goal 2 - Progress (Week 1): Progressing toward goal OT Short Term Goal 3 (Week 1): Pt will perform UB dressing with min A in order to increase I with functional tasks.  OT Short Term Goal 3 - Progress (Week 1): Met OT Short Term Goal 4 (Week 1): Pt will perform toilet transfer with min A in order to increae I with functional transfers.  OT Short Term Goal 4 - Progress (Week 1): Progressing toward goal Week 2:  OT Short Term Goal 1 (Week 2): STGs=LTGs seccondary to upcoming discharge  Skilled Therapeutic Interventions/Progress Updates:    Upon entering the room, pt sitting on EOB  with RN present in the room. Pt very pleasant this session and requesting to shower. Pt ambulated into bathroom with RW and mod A secondary to posterior LOB. Pt performed bathing while seated on TTB with steady assist for balance. Pt needing min cues for sequencing and initiation. Pt returning to wheelchair at sink to don clothing items. Pt able to perform grooming tasks while seated with supervision and increased time. Pt brushing her hair without assistance this session. Pt remained in wheelchair at end of session with quick release belt donned. Call bell and all needed items within reach upon exiting the room.   Therapy Documentation Precautions:  Precautions Precautions: Fall Restrictions Weight Bearing Restrictions: No  See Function Navigator for Current Functional Status.   Therapy/Group: Individual Therapy  Phineas Semen 01/06/2016, 8:44 AM

## 2016-01-06 NOTE — Progress Notes (Signed)
Physical Therapy Session Note  Patient Details  Name: Kelly Wilson MRN: [id number] Date of Birth: 1955/12/24  Today's Date: 01/06/2016 PT Individual Time: 1400-1500 PT Individual Time Calculation (min): 60 min   Short Term Goals: Week 2:  PT Short Term Goal 1 (Week 2): =LTGs  Skilled Therapeutic Interventions/Progress Updates:  1.  History and Physical Examination    Pt presents with intermittent episodes of her head feeling "light" and accompanying nausea but no emesis.  Due to cognitive issues pt with increased difficulty recalling aggravating or alleviating factors but from observation and therapist report it appears that onset of symptoms occurs during transitional movements and is short in duration.  Pt denies changes in hearing or vision.  Pt currently not reporting headache.      Glasses: Y for reading    Antivertiginous Medications: Zofran for nausea  2. Vestibular Assessment                           Gross neck ROM Limited due to incision posterior neck; head rests in L lateral tilt and rotation to compensate for R eye resting position  Eye Alignment WFL but ptosis of L eyelid   Oculomotor ROM WFL  Spontaneous  Nystagmus (room light and vision occluded) None seen in room light  Gaze holding nystagmus(room light and vision occluded) 1-2 beats direction changing nystagmus  Smooth pursuit WFL  Saccades Delayed  Vergence WFL  VOR Cancellation WFL  Pressure Tests (vision occluded) N/T  VOR slow Impaired gaze stability  Head Thrust Test Unable to test due to guarding  Head Shaking Test (vision occluded) N/T  Dynamic Visual Acuity         N/T  Rt. Hallpike Dix N/T  Lt. Hallpike Dix N/T  Rt. Roll Test N/T  Lt. Roll Test  N/T  MSQ 5/10 dizziness with head tipped to/from R knee,   Cover-Cross Cover (if indicated) When uncovered-R eye resets down and in  Head-Neck Differentiation Test (if indicated) N/T   3. Findings:  Patient signs and symptoms consistent with motion  sensitivity, central vertigo, impaired gaze stability and impaired subjective visual and postural vertical.  4. Recommendations for Treatment: Habituation for motion sensitivity with focus on reaching down, forwards and up and to the R for weight shifting and re-orientation to midline; x 1 viewing for gaze stability in unsupported sitting with target 3 and 28ft away performing vertical and horizontal head turns working up to 30 seconds at comfortable speed keeping target in focus.  Compensation for safety with transitional movements: eye-head shifts (spotting) with visual targets for rolling, supine <> sit and pivots in standing.    Therapy Documentation Precautions:  Precautions Precautions: Fall Restrictions Weight Bearing Restrictions: Yes  Vital Signs: Therapy Vitals Temp: 97.6 F (36.4 C) Temp Source: Oral Pulse Rate: (!) 55 Resp: 18 BP: 130/60 mmHg Patient Position (if appropriate): Lying Oxygen Therapy SpO2: 100 % O2 Device: Not Delivered Pain: Pain Assessment Pain Assessment: No/denies pain  See Function Navigator for Current Functional Status.   Therapy/Group: Individual Therapy  Raylene Everts [facility] 01/06/2016, 4:44 PM

## 2016-01-06 NOTE — Progress Notes (Signed)
[place] PHYSICAL MEDICINE & REHABILITATION     PROGRESS NOTE    Subjective/Complaints: Patient seen this morning sitting up on the bedside commode. She states that she feels better. Nursing and family also stated patient is doing better.  ROS: Denies CP, COB, N/V/D.  Objective: Vital Signs: Blood pressure 135/61, pulse 54, temperature 97.3 F (36.3 C), temperature source Oral, resp. rate 18, height 5\' 6"  (1.676 m), weight 104.7 kg (230 lb 13.2 oz), SpO2 99 %. No results found. No results for input(s): WBC, HGB, HCT, PLT in the last 72 hours. No results for input(s): NA, K, CL, GLUCOSE, BUN, CREATININE, CALCIUM in the last 72 hours.  Invalid input(s): CO CBG (last 3)  No results for input(s): GLUCAP in the last 72 hours.  Wt Readings from Last 3 Encounters:  01/06/16 104.7 kg (230 lb 13.2 oz)  12/16/15 108.863 kg (240 lb)  11/05/15 114.306 kg (252 lb)    Physical Exam:  Constitutional: She appears well-developed. No distress. Obese  HENT: Normocephalic and atraumatic.  Eyes: EOM and Conj are normal.  Neck: Normal range of motion. Neck supple. No JVD present. No tracheal deviation present. No thyromegaly present.  Posterior head incision intact  Cardiovascular: regular rhythm. Bradycardia Respiratory: Effort normal and breath sounds normal. No respiratory distress.  GI: Soft. Bowel sounds are normal. She exhibits no distension.  Neurological: She is alert and oriented.    Strength 4+/5 bilateral upper ext and 4+/5 HF, 4+/5 KE and 5/5 ADFPF.  Skin: She is not diaphoretic. Surgical incision intact. Red/macular rash on back, Almost resolved.  Psychiatric: She has a flat affect.      Assessment/Plan: 1. Impaired functional mobility and self-care secondary to IVH/hydrocephalus which require 3+ hours per day of interdisciplinary therapy in a comprehensive inpatient rehab setting. Physiatrist is providing close team supervision and 24 hour management of active medical problems  listed below. Physiatrist and rehab team continue to assess barriers to discharge/monitor patient progress toward functional and medical goals.  Function:  Bathing Bathing position   Position: Shower  Bathing parts Body parts bathed by patient: Chest, Abdomen, Front perineal area, Left upper leg, Right upper leg, Buttocks, Right arm, Left arm, Right lower leg, Left lower leg Body parts bathed by helper: Back  Bathing assist Assist Level: Touching or steadying assistance(Pt > 75%)      Upper Body Dressing/Undressing Upper body dressing   What is the patient wearing?: Pull over shirt/dress Bra - Perfomed by patient: Thread/unthread right bra strap, Hook/unhook bra (pull down sports bra) Bra - Perfomed by helper: Hook/unhook bra (pull down sports bra) Pull over shirt/dress - Perfomed by patient: Thread/unthread right sleeve, Thread/unthread left sleeve, Put head through opening, Pull shirt over trunk Pull over shirt/dress - Perfomed by helper: Pull shirt over trunk        Upper body assist Assist Level: Set up   Set up : To obtain clothing/put away  Lower Body Dressing/Undressing Lower body dressing   What is the patient wearing?: Pants, Underwear, Socks, Shoes   Underwear - Performed by helper: Thread/unthread right underwear leg, Thread/unthread left underwear leg, Pull underwear up/down Pants- Performed by patient: Pull pants up/down Pants- Performed by helper: Thread/unthread right pants leg, Thread/unthread left pants leg Non-skid slipper socks- Performed by patient: Don/doff right sock, Don/doff left sock Non-skid slipper socks- Performed by helper: Don/doff right sock, Don/doff left sock Socks - Performed by patient: Don/doff right sock, Don/doff left sock   Shoes - Performed by patient: Don/doff right  shoe, Don/doff left shoe Shoes - Performed by helper: Fasten right, Fasten left       TED Hose - Performed by helper: Don/doff right TED hose, Don/doff left TED hose   Lower body assist Assist for lower body dressing: Touching or steadying assistance (Pt > 75%)      Toileting Toileting   Toileting steps completed by patient: Performs perineal hygiene, Adjust clothing prior to toileting Toileting steps completed by helper: Adjust clothing after toileting Toileting Assistive Devices: Grab bar or rail  Toileting assist Assist level: Touching or steadying assistance (Pt.75%)   Transfers Chair/bed transfer   Chair/bed transfer method: Stand pivot Chair/bed transfer assist level: Touching or steadying assistance (Pt > 75%) Chair/bed transfer assistive device: Armrests, Medical sales representative     Max distance: 15 Assist level: Touching or steadying assistance (Pt > 75%)   Wheelchair   Type: Manual Max wheelchair distance: 150 Assist Level: Touching or steadying assistance (Pt > 75%)  Cognition Comprehension Comprehension assist level: Understands basic 50 - 74% of the time/ requires cueing 25 - 49% of the time  Expression Expression assist level: Expresses basic 50 - 74% of the time/requires cueing 25 - 49% of the time. Needs to repeat parts of sentences.  Social Interaction Social Interaction assist level: Interacts appropriately 50 - 74% of the time - May be physically or verbally inappropriate.  Problem Solving Problem solving assist level: Solves basic 50 - 74% of the time/requires cueing 25 - 49% of the time  Memory Memory assist level: Recognizes or recalls 50 - 74% of the time/requires cueing 25 - 49% of the time   Medical Problem List and Plan: 1. 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   2. DVT Prophylaxis/Anticoagulation: Subcutaneous Lovenox initiated 12/22/2015. Monitor for any bleeding episodes 3. Pain Management: Neurontin 900 mg every 8 hours.  4. Hypertension. Maxide daily,Nimotop 60 mg every 4 hours 21 days initiated 12/17/2015  Will cont to monitor, overall controlled 5. Neuropsych: This patient is capable of making decisions on her own behalf. 6. Skin/Wound Care: Routine skin checks 7. Fluids/Electrolytes/Nutrition:  Discussed intake with patient   Added megace to help stimulate appetite .   IVF d/ced on 6/12  Appetite appears to be slowly improving overall  Hypokalemia: 3.7 on 6/15 after supplementation 8. Seizure prophylaxis. Keppra 500 mg twice a day, valproic acid 500 mg daily. EEG negative 9. Hyponatremia.   Continue sodium chloride tablets for now.  Na 130 on 6/15 (stable range) 10. Heat rash--Improved  Continue hypoallergenic sheets 11. UTI  UA positive for bacteria, U culture multi-species  Continue macrobid as patient symptomatically improving  LOS (Days) 10 A FACE TO FACE EVALUATION WAS PERFORMED  Ankit Lorie Phenix 01/06/2016 9:35 AM

## 2016-01-07 ENCOUNTER — Inpatient Hospital Stay (HOSPITAL_COMMUNITY): Payer: BLUE CROSS/BLUE SHIELD | Admitting: Occupational Therapy

## 2016-01-07 ENCOUNTER — Inpatient Hospital Stay (HOSPITAL_COMMUNITY): Payer: BLUE CROSS/BLUE SHIELD | Admitting: Physical Therapy

## 2016-01-07 ENCOUNTER — Inpatient Hospital Stay (HOSPITAL_COMMUNITY): Payer: BLUE CROSS/BLUE SHIELD | Admitting: Speech Pathology

## 2016-01-07 LAB — CBC WITH DIFFERENTIAL/PLATELET
Basophils Absolute: 0 10*3/uL (ref 0.0–0.1)
Basophils Relative: 1 %
Eosinophils Absolute: 0.1 10*3/uL (ref 0.0–0.7)
Eosinophils Relative: 1 %
HEMATOCRIT: 38.1 % (ref 36.0–46.0)
Hemoglobin: 12.9 g/dL (ref 12.0–15.0)
LYMPHS ABS: 1.3 10*3/uL (ref 0.7–4.0)
Lymphocytes Relative: 24 %
MCH: 27.6 pg (ref 26.0–34.0)
MCHC: 33.9 g/dL (ref 30.0–36.0)
MCV: 81.6 fL (ref 78.0–100.0)
MONO ABS: 0.6 10*3/uL (ref 0.1–1.0)
MONOS PCT: 12 %
NEUTROS ABS: 3.4 10*3/uL (ref 1.7–7.7)
Neutrophils Relative %: 62 %
Platelets: 306 10*3/uL (ref 150–400)
RBC: 4.67 MIL/uL (ref 3.87–5.11)
RDW: 13.8 % (ref 11.5–15.5)
WBC: 5.4 10*3/uL (ref 4.0–10.5)

## 2016-01-07 LAB — BASIC METABOLIC PANEL
ANION GAP: 11 (ref 5–15)
BUN: 12 mg/dL (ref 6–20)
CO2: 22 mmol/L (ref 22–32)
Calcium: 9.4 mg/dL (ref 8.9–10.3)
Chloride: 101 mmol/L (ref 101–111)
Creatinine, Ser: 0.76 mg/dL (ref 0.44–1.00)
GFR calc Af Amer: >60 mL/min (ref >60)
GFR calc non Af Amer: >60 mL/min (ref >60)
GLUCOSE: 100 mg/dL — AB (ref 65–99)
POTASSIUM: 2.9 mmol/L — AB (ref 3.5–5.1)
Sodium: 134 mmol/L — ABNORMAL LOW (ref 135–145)

## 2016-01-07 MED ORDER — POTASSIUM CHLORIDE CRYS ER 20 MEQ PO TBCR
40.0000 meq | EXTENDED_RELEASE_TABLET | Freq: Two times a day (BID) | ORAL | Status: AC
Start: 2016-01-07 — End: 2016-01-08
  Administered 2016-01-07 – 2016-01-08 (×4): 40 meq via ORAL
  Filled 2016-01-07 (×3): qty 2

## 2016-01-07 MED ORDER — BOOST / RESOURCE BREEZE PO LIQD
1.0000 | Freq: Three times a day (TID) | ORAL | Status: DC
Start: 2016-01-07 — End: 2016-01-15
  Administered 2016-01-07 – 2016-01-13 (×10): 1 via ORAL

## 2016-01-07 MED ORDER — AMANTADINE HCL 50 MG/5ML PO SYRP
100.0000 mg | ORAL_SOLUTION | Freq: Two times a day (BID) | ORAL | Status: DC
  Administered 2016-01-08: 100 mg via ORAL
  Filled 2016-01-07 (×4): qty 10

## 2016-01-07 NOTE — Progress Notes (Signed)
Occupational Therapy Session Note  Patient Details  Name: Kelly Wilson MRN: [id number] Date of Birth: 02/16/1956  Today's Date: 01/07/2016 OT Individual Time: [id number] OT Individual Time Calculation (min): 45 min    Short Term Goals: Week 2:  OT Short Term Goal 1 (Week 2): STGs=LTGs seccondary to upcoming discharge  Skilled Therapeutic Interventions/Progress Updates:    Upon entering the room, pt in bed with husband present in room attempting to get pt to eat breakfast. Husband becomes frustrated and storms out of the room stating, "Here, you try to get her to eat. I'm going to work." Pt stating the smell of food makes her feel nauseated. Pt performed supine>sit with steady assist and use of bed rail. Pt needing min cues for proper hand placement for sit >stand with steady assist and use of RW. Pt ambulating 10' into toilet with steady assist. Pt not aware than she had been incontinent of BM in brief. Pt required steady assist for toileting and hygiene. While seated on commode, pt dressing UB with set up A to obtain items. Pt returning to sit in wheelchair at sink side for further hygiene thoroughness for buttocks and peri area with steady assist for balance. Pt seated in wheelchair performing grooming tasks with RN entering the room. Pt in very pleasant mood and singing. Pt remained at seat in wheelchair with RN in room upon exiting.   Therapy Documentation Precautions:  Precautions Precautions: Fall Restrictions Weight Bearing Restrictions: No Vital Signs: Therapy Vitals Pulse Rate: 82 BP: (!) 150/79 mmHg  See Function Navigator for Current Functional Status.   Therapy/Group: Individual Therapy  Phineas Semen 01/07/2016, 12:19 PM

## 2016-01-07 NOTE — Progress Notes (Signed)
Physical Therapy Session Note  Patient Details  Name: Kelly Wilson MRN: [id number] Date of Birth: 18-Feb-1956  Today's Date: 01/07/2016 PT Individual Time: 1105-1205 PT Individual Time Calculation (min): 60 min   Short Term Goals: Week 1:  PT Short Term Goal 1 (Week 1): Pt will perform bed mobility on flat bed with min A and min cues for initiation/sequencing PT Short Term Goal 1 - Progress (Week 1): Met PT Short Term Goal 2 (Week 1): Pt will perform stand pivot transfers with LRAD and min A and min cues for initiation/sequencing PT Short Term Goal 2 - Progress (Week 1): Met PT Short Term Goal 3 (Week 1): Pt will perform w/c mobility x 150' for endurance training with min A and 2 rest breaks PT Short Term Goal 3 - Progress (Week 1): Met PT Short Term Goal 4 (Week 1): Pt will ambulate 66' with LRAD with improved gait speed and min A and min cues for safety, sequencing and initiation PT Short Term Goal 4 - Progress (Week 1): Met PT Short Term Goal 5 (Week 1): Pt will negotiate 4 stairs with 2 rails and min A PT Short Term Goal 5 - Progress (Week 1): Met  Skilled Therapeutic Interventions/Progress Updates:    Pt received resting in w/c with no c/o pain and agreeable to therapy session.  Pt donned shoes with min assist and more than a reasonable amount of time.  W/C propulsion to therapy gym with supervision and mod verbal cues for attention to objects in L visual field.  Ambulatory transfer from w/c>therapy mat with steady assist and mod verbal cues for turning.  Pt engaged in NMR activity reaching outside BOS in sagittal plane and crossing midline for trunk rotation/elongation/shortening to R and L.  NMR in standing with alternating toe taps on 3" box with mod assist to maintain balance and facilitate weight shift R<>L.  PT provided pt education regarding weight shift during ambulation and negotiating stairs and carryover from current activity and pt verbalized understanding.  Ambulation x60'  through therapy gym with RW and steady assist. Nustep x10 minutes at level 3 with pt maintaining 30-40 steps per minute x10 minutes for reciprocal stepping pattern, forced use, and overall activity tolerance.  Pt transitioned to w/c at end of session and returned to room total assist for energy conservation.  Pt left upright in w/c with QRB in place, call bell in reach, and needs met.   Therapy Documentation Precautions:  Precautions Precautions: Fall Restrictions Weight Bearing Restrictions: No   See Function Navigator for Current Functional Status.   Therapy/Group: Individual Therapy  Earnest Conroy Penven-Crew 01/07/2016, 12:13 PM

## 2016-01-07 NOTE — Progress Notes (Addendum)
[facility] PHYSICAL MEDICINE & REHABILITATION     PROGRESS NOTE    Subjective/Complaints: Pt laying in bed this AM.  She slept well overnight.  Her husband notes poor appetite.  Appetite is improving and pt states she will try to do better to eat better.   ROS: Denies CP, COB, N/V/D.  Objective: Vital Signs: Blood pressure 150/79, pulse 82, temperature 98.7 F (37.1 C), temperature source Oral, resp. rate 17, height 5\' 6"  (1.676 m), weight 103.8 kg (228 lb 13.4 oz), SpO2 97 %. No results found.  Recent Labs  01/07/16 0544  WBC 5.4  HGB 12.9  HCT 38.1  PLT 306    Recent Labs  01/07/16 0544  NA 134*  K 2.9*  CL 101  GLUCOSE 100*  BUN 12  CREATININE 0.76  CALCIUM 9.4   CBG (last 3)  No results for input(s): GLUCAP in the last 72 hours.  Wt Readings from Last 3 Encounters:  01/07/16 103.8 kg (228 lb 13.4 oz)  12/16/15 108.863 kg (240 lb)  11/05/15 114.306 kg (252 lb)    Physical Exam:  Constitutional: She appears well-developed. No distress. Obese  HENT: Normocephalic and atraumatic.  Eyes: EOM and Conj are normal.  Neck: Normal range of motion. Neck supple. No JVD present. No tracheal deviation present. No thyromegaly present.  Posterior head incision intact  Cardiovascular: regular rhythm and rate.  Respiratory: Effort normal and breath sounds normal. No respiratory distress.  GI: Soft. Bowel sounds are normal. She exhibits no distension.  Neurological: She is alert and oriented.    Strength 4+/5 bilateral upper ext and 4+/5 HF, 4+/5 KE and 5/5 ADFPF.  Skin: She is not diaphoretic.  Surgical incision c/d/i. Red/macular rash on back, almost resolved.  Inner thighs with moisture related rash Psychiatric: She has a flat affect.      Assessment/Plan: 1. Impaired functional mobility and self-care secondary to IVH/hydrocephalus which require 3+ hours per day of interdisciplinary therapy in a comprehensive inpatient rehab setting. Physiatrist is providing close  team supervision and 24 hour management of active medical problems listed below. Physiatrist and rehab team continue to assess barriers to discharge/monitor patient progress toward functional and medical goals.  Function:  Bathing Bathing position   Position: Shower  Bathing parts Body parts bathed by patient: Chest, Abdomen, Front perineal area, Left upper leg, Right upper leg, Buttocks, Right arm, Left arm, Right lower leg, Left lower leg Body parts bathed by helper: Back  Bathing assist Assist Level: Touching or steadying assistance(Pt > 75%)      Upper Body Dressing/Undressing Upper body dressing   What is the patient wearing?: Pull over shirt/dress Bra - Perfomed by patient: Thread/unthread right bra strap, Hook/unhook bra (pull down sports bra) Bra - Perfomed by helper: Hook/unhook bra (pull down sports bra) Pull over shirt/dress - Perfomed by patient: Thread/unthread right sleeve, Thread/unthread left sleeve, Put head through opening, Pull shirt over trunk Pull over shirt/dress - Perfomed by helper: Pull shirt over trunk        Upper body assist Assist Level: Set up   Set up : To obtain clothing/put away  Lower Body Dressing/Undressing Lower body dressing   What is the patient wearing?: Pants, Underwear, Socks, Shoes   Underwear - Performed by helper: Thread/unthread right underwear leg, Thread/unthread left underwear leg, Pull underwear up/down Pants- Performed by patient: Pull pants up/down Pants- Performed by helper: Thread/unthread right pants leg, Thread/unthread left pants leg Non-skid slipper socks- Performed by patient: Don/doff right sock, Don/doff  left sock Non-skid slipper socks- Performed by helper: Don/doff right sock, Don/doff left sock Socks - Performed by patient: Don/doff right sock, Don/doff left sock   Shoes - Performed by patient: Don/doff right shoe, Don/doff left shoe Shoes - Performed by helper: Fasten right, Fasten left       TED Hose -  Performed by helper: Don/doff right TED hose, Don/doff left TED hose  Lower body assist Assist for lower body dressing: Touching or steadying assistance (Pt > 75%)      Toileting Toileting   Toileting steps completed by patient: Adjust clothing prior to toileting, Performs perineal hygiene, Adjust clothing after toileting Toileting steps completed by helper: Adjust clothing after toileting Toileting Assistive Devices: Grab bar or rail  Toileting assist Assist level: Touching or steadying assistance (Pt.75%)   Transfers Chair/bed transfer   Chair/bed transfer method: Stand pivot Chair/bed transfer assist level: Touching or steadying assistance (Pt > 75%) Chair/bed transfer assistive device: Armrests, Medical sales representative     Max distance: 200 Assist level: Touching or steadying assistance (Pt > 75%)   Wheelchair   Type: Manual Max wheelchair distance: 150 Assist Level: Supervision or verbal cues  Cognition Comprehension Comprehension assist level: Understands basic 50 - 74% of the time/ requires cueing 25 - 49% of the time  Expression Expression assist level: Expresses basic 90% of the time/requires cueing < 10% of the time.  Social Interaction Social Interaction assist level: Interacts appropriately 90% of the time - Needs monitoring or encouragement for participation or interaction.  Problem Solving Problem solving assist level: Solves complex 90% of the time/cues < 10% of the time  Memory Memory assist level: Recognizes or recalls 90% of the time/requires cueing < 10% of the time   Medical Problem List and Plan: 1. 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   2. DVT Prophylaxis/Anticoagulation: Subcutaneous Lovenox initiated 12/22/2015. Monitor for any bleeding episodes 3. Pain Management: Neurontin 900 mg every 8 hours.  4. Hypertension. Maxide daily,Nimotop 60 mg every 4 hours 21 days initiated 12/17/2015  Will cont to monitor, overall controlled 5. Neuropsych: This patient is capable of making decisions on her own behalf. 6. Skin/Wound Care: Routine skin checks  Ensure hygiene for back and groin rashes.  Cream to inner groins. 7. Fluids/Electrolytes/Nutrition:  Discussed intake with patient   Added megace to help stimulate appetite .   IVF d/ced on 6/12  Appetite appears to be slowly improving overall  Hypokalemia: 2.9 on 6/20, will supplement  Boost started on 6/20 8. Seizure prophylaxis. Keppra 500 mg twice a day, valproic acid 500 mg daily. EEG negative 9. Hyponatremia.   Continue sodium chloride tablets for now.  Na 134 on 6/20 (stable range) 10. Heat rash--Improved  Continue hypoallergenic sheets 11. UTI  UA positive for bacteria, U culture multi-species  Continue macrobid as patient symptomatically improving  LOS (Days) 11 A FACE TO FACE EVALUATION WAS PERFORMED   Lorie Phenix 01/07/2016 9:15 AM

## 2016-01-07 NOTE — Progress Notes (Signed)
Physical Therapy Session Note  Patient Details  Name: Kelly Wilson MRN: [id number] Date of Birth: [birth year]  Today's Date: 01/07/2016 PT Individual Time: 1550-1630 PT Individual Time Calculation (min): 40 min   Short Term Goals: Week 2:  PT Short Term Goal 1 (Week 2): =LTGs  Skilled Therapeutic Interventions/Progress Updates:    Pt received resting in w/c with no c/o pain and agreeable to therapy session.    W/C propulsion to therapy gym using BUEs for overall endurance and attention to R=L forward push.  W/C propulsion back to room at end of session with BLEs for NMR reciprocal stepping pattern and endurance.    PT instructed pt in static standing activity focus on BOS, upright posture, midline orientation, and ankle strategy x5 minutes while attempting to do simple puzzle.  Pt unable to focus on puzzle and required close supervision with intermittent steady assist to maintain balance with BUE support on therapy bench.    Pt returned to room to use toilet.  Pt performed toilet transfer with steady assist, and able to perform hygiene and clothing management with min assist from PT to pull pants to knees and for hygiene thoroughness.    Pt returned to w/c at end of sessio and positioned with call bell in reach and needs met.   Therapy Documentation Precautions:  Precautions Precautions: Fall Restrictions Weight Bearing Restrictions: No   See Function Navigator for Current Functional Status.   Therapy/Group: Individual Therapy  Earnest Conroy Penven-Crew 01/07/2016, 5:05 PM

## 2016-01-07 NOTE — Progress Notes (Signed)
Speech Language Pathology Daily Session Note  Patient Details  Name: Kelly Wilson MRN: [id number] Date of Birth: [birth year]  Today's Date: 01/07/2016 SLP Individual Time: 1000-1100 SLP Individual Time Calculation (min): 60 min  Short Term Goals: Week 2: SLP Short Term Goal 1 (Week 2): Patient will initiate functional tasks with Mod A verbal cues.  SLP Short Term Goal 2 (Week 2): Patient will demonstrate functional problem solving for basic and familiar tasks with Mod A verbal cues.  SLP Short Term Goal 3 (Week 2): Patient will utilize external memory aids to recall new, daily information with Mod A question and verbal cues.  SLP Short Term Goal 4 (Week 2): Patient will utilize call bell to request assistance in 25% of observable opportunities with Max A multimodal cues.   SLP Short Term Goal 5 (Week 2): Patient will self-monitor and correct errors during functional tasks/verbal conversation with Mod A question cues.  SLP Short Term Goal 6 (Week 2): Patient will demonstrate sustained attention to a functional task for ~15 minutes with Mod A verbal cues for redirection.   Skilled Therapeutic Interventions: Skilled treatment session focused on cognitive goals. SLP facilitated session by providing Mod A question and verbal cues for orientation to place and total A for recall of events from previous therapy sessions. SLP also facilitated session by providing Min A question cues for basic problem solving during a basic medication management task. Patient demonstrated sustained attention to task for ~10 minutes with Min A verbal cues for redirection. However, patient initiated all tasks with Min A verbal cues. Patient left sitting upright in wheelchair with all needs within reach. Continue with current plan of care.    Function:   Cognition Comprehension Comprehension assist level: Understands basic 50 - 74% of the time/ requires cueing 25 - 49% of the time  Expression   Expression assist  level: Expresses basic 50 - 74% of the time/requires cueing 25 - 49% of the time. Needs to repeat parts of sentences.  Social Interaction Social Interaction assist level: Interacts appropriately 50 - 74% of the time - May be physically or verbally inappropriate.  Problem Solving Problem solving assist level: Solves basic 50 - 74% of the time/requires cueing 25 - 49% of the time  Memory Memory assist level: Recognizes or recalls 50 - 74% of the time/requires cueing 25 - 49% of the time    Pain No/Denies Pain   Therapy/Group: Individual Therapy  ,  01/07/2016, 4:34 PM

## 2016-01-08 ENCOUNTER — Inpatient Hospital Stay (HOSPITAL_COMMUNITY): Payer: BLUE CROSS/BLUE SHIELD | Admitting: Occupational Therapy

## 2016-01-08 ENCOUNTER — Inpatient Hospital Stay (HOSPITAL_COMMUNITY): Payer: BLUE CROSS/BLUE SHIELD | Admitting: Speech Pathology

## 2016-01-08 ENCOUNTER — Inpatient Hospital Stay (HOSPITAL_COMMUNITY): Payer: BLUE CROSS/BLUE SHIELD | Admitting: Physical Therapy

## 2016-01-08 MED ORDER — AMANTADINE HCL 100 MG PO CAPS
100.0000 mg | ORAL_CAPSULE | Freq: Two times a day (BID) | ORAL | Status: DC
  Administered 2016-01-08 – 2016-01-15 (×14): 100 mg via ORAL
  Filled 2016-01-08 (×15): qty 1

## 2016-01-08 NOTE — Progress Notes (Signed)
Occupational Therapy Session Note  Patient Details  Name: LARYN WOLVERTON MRN: [id number] Date of Birth: 1955-08-20  Today's Date: 01/08/2016 OT Individual Time:  0803- 0900   32 minutes skilled OT Intervention   Short Term Goals: Week 2:  OT Short Term Goal 1 (Week 2): STGs=LTGs seccondary to upcoming discharge  Skilled Therapeutic Interventions/Progress Updates:    Upon entering the room, pt supine in bed with no c/o pain this session. Pt agreeable to OT intervention. Pt did declined shower but requesting to change clothing items. Pt ambulated with RW to bathroom with steady assist for toileting. Pt performed clothing management and hygiene with steady assist for balance. Pt returning to sit in wheelchair at sink. Pt with needing supervision and min cues for attention and initiation with self care tasks. Pt performed UB self care with set up A. Pt needing steady assist as posterior lean present during LB clothing management at sink. Pt ambulating to sit in recliner chair at end of session with call bell and all needed items within reach.  Therapy Documentation Precautions:  Precautions Precautions: Fall Restrictions Weight Bearing Restrictions: No  See Function Navigator for Current Functional Status.   Therapy/Group: Individual Therapy  Phineas Semen 01/08/2016, 5:29 PM

## 2016-01-08 NOTE — Progress Notes (Addendum)
[place] PHYSICAL MEDICINE & REHABILITATION     PROGRESS NOTE    Subjective/Complaints:  patient seen sitting up in bed this morning. She states she likes her new room, but would've preferred to be home. She would like to know if her head is "okay".  ROS: Denies CP, COB, N/V/D.  Objective: Vital Signs: Blood pressure 119/63, pulse 60, temperature 97.7 F (36.5 C), temperature source Oral, resp. rate 18, height 5\' 6"  (1.676 m), weight 104 kg (229 lb 4.5 oz), SpO2 95 %. No results found.  Recent Labs  01/07/16 0544  WBC 5.4  HGB 12.9  HCT 38.1  PLT 306    Recent Labs  01/07/16 0544  NA 134*  K 2.9*  CL 101  GLUCOSE 100*  BUN 12  CREATININE 0.76  CALCIUM 9.4   CBG (last 3)  No results for input(s): GLUCAP in the last 72 hours.  Wt Readings from Last 3 Encounters:  01/08/16 104 kg (229 lb 4.5 oz)  12/16/15 108.863 kg (240 lb)  11/05/15 114.306 kg (252 lb)    Physical Exam:  Constitutional: She appears well-developed. No distress. Obese  HENT: Normocephalic and atraumatic.  Eyes: EOM and Conj are normal.  Neck: Normal range of motion. Neck supple. No JVD present. No tracheal deviation present. No thyromegaly present.  Posterior head incision intact  Cardiovascular: regular rhythm and rate.  Respiratory: Effort normal and breath sounds normal. No respiratory distress.  GI: Soft. Bowel sounds are normal. She exhibits no distension.  Neurological: She is alert and oriented.    Strength 4+-5/5 bilateral upper ext and 4+-5/5 HF, 4+-5/5 KE and 5/5 ADFPF.  Skin: She is not diaphoretic.  Surgical incision c/d/i. Red/macular rash on back, almost resolved.  Inner thighs with moisture related rash Psychiatric: She has a flat affect.      Assessment/Plan: 1. Impaired functional mobility and self-care secondary to IVH/hydrocephalus which require 3+ hours per day of interdisciplinary therapy in a comprehensive inpatient rehab setting. Physiatrist is providing close  team supervision and 24 hour management of active medical problems listed below. Physiatrist and rehab team continue to assess barriers to discharge/monitor patient progress toward functional and medical goals.  Function:  Bathing Bathing position   Position: Shower  Bathing parts Body parts bathed by patient: Chest, Abdomen, Front perineal area, Left upper leg, Right upper leg, Buttocks, Right arm, Left arm, Right lower leg, Left lower leg Body parts bathed by helper: Back  Bathing assist Assist Level: Touching or steadying assistance(Pt > 75%)      Upper Body Dressing/Undressing Upper body dressing   What is the patient wearing?: Bra, Pull over shirt/dress Bra - Perfomed by patient: Thread/unthread right bra strap, Hook/unhook bra (pull down sports bra), Thread/unthread left bra strap Bra - Perfomed by helper: Hook/unhook bra (pull down sports bra) Pull over shirt/dress - Perfomed by patient: Thread/unthread right sleeve, Thread/unthread left sleeve, Put head through opening, Pull shirt over trunk Pull over shirt/dress - Perfomed by helper: Pull shirt over trunk        Upper body assist Assist Level: Set up   Set up : To obtain clothing/put away  Lower Body Dressing/Undressing Lower body dressing   What is the patient wearing?: Pants, Underwear, Non-skid slipper socks Underwear - Performed by patient: Thread/unthread right underwear leg, Thread/unthread left underwear leg Underwear - Performed by helper: Pull underwear up/down Pants- Performed by patient: Thread/unthread right pants leg, Thread/unthread left pants leg, Pull pants up/down Pants- Performed by helper: Thread/unthread right pants  leg, Thread/unthread left pants leg Non-skid slipper socks- Performed by patient: Don/doff right sock, Don/doff left sock Non-skid slipper socks- Performed by helper: Don/doff right sock, Don/doff left sock Socks - Performed by patient: Don/doff right sock, Don/doff left sock   Shoes -  Performed by patient: Don/doff right shoe, Don/doff left shoe Shoes - Performed by helper: Fasten right, Fasten left       TED Hose - Performed by helper: Don/doff right TED hose, Don/doff left TED hose  Lower body assist Assist for lower body dressing: Touching or steadying assistance (Pt > 75%)      Toileting Toileting   Toileting steps completed by patient: Adjust clothing prior to toileting, Performs perineal hygiene Toileting steps completed by helper: Adjust clothing after toileting, Performs perineal hygiene Toileting Assistive Devices: Grab bar or rail  Toileting assist Assist level: No help/no cues   Transfers Chair/bed transfer   Chair/bed transfer method: Ambulatory Chair/bed transfer assist level: Touching or steadying assistance (Pt > 75%) Chair/bed transfer assistive device: Armrests, Medical sales representative     Max distance: 60 Assist level: Touching or steadying assistance (Pt > 75%)   Wheelchair   Type: Manual Max wheelchair distance: 150 Assist Level: No help, No cues, assistive device, takes more than reasonable amount of time  Cognition Comprehension Comprehension assist level: Understands basic 50 - 74% of the time/ requires cueing 25 - 49% of the time  Expression Expression assist level: Expresses basic 50 - 74% of the time/requires cueing 25 - 49% of the time. Needs to repeat parts of sentences.  Social Interaction Social Interaction assist level: Interacts appropriately 50 - 74% of the time - May be physically or verbally inappropriate.  Problem Solving Problem solving assist level: Solves basic 50 - 74% of the time/requires cueing 25 - 49% of the time  Memory Memory assist level: Recognizes or recalls 25 - 49% of the time/requires cueing 50 - 75% of the time   Medical Problem List and Plan: 1. 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   2. DVT Prophylaxis/Anticoagulation: Subcutaneous Lovenox initiated 12/22/2015. Monitor for any bleeding episodes 3. Pain Management: Neurontin 900 mg every 8 hours.  4. Hypertension. Maxide daily,Nimotop 60 mg every 4 hours 21 days initiated 12/17/2015  Will cont to monitor, overall controlled 5. Neuropsych: This patient is capable of making decisions on her own behalf. 6. Skin/Wound Care: Routine skin checks  Ensure hygiene for back and groin rashes.  Cream to inner groins. 7. Fluids/Electrolytes/Nutrition:  Discussed intake with patient   Added megace to help stimulate appetite. Continue to encourage intake.  Hypokalemia: 2.9 on 6/20, supplemented  Boost started on 6/20 8. Seizure prophylaxis. Keppra 500 mg twice a day, valproic acid 500 mg daily. EEG negative 9. Hyponatremia.   Continue sodium chloride tablets for now.  Na 134 on 6/20 (stable range) 10. Heat rash--Improved  Continue hypoallergenic sheets 11. UTI  UA positive for bacteria, U culture multi-species  Continue macrobid as patient symptomatically improving  LOS (Days) 12 A FACE TO FACE EVALUATION WAS PERFORMED   Lorie Phenix 01/08/2016 8:42 AM

## 2016-01-08 NOTE — Progress Notes (Signed)
Physical Therapy Session Note  Patient Details  Name: Kelly Wilson MRN: [id number] Date of Birth: 04/15/1956  Today's Date: 01/08/2016 PT Individual Time: 1006-1100 PT Individual Time Calculation (min): 54 min   Short Term Goals: Week 2: =LTGs  Skilled Therapeutic Interventions/Progress Updates:    Pt received resting in recliner with no c/o pain and agreeable to therapy session.  Session focus on transfers, gait with RW, NMR, balance, and cognition.    Pt transitioned from recliner to w/c with steady assist stand/pivot and propelled w/c with BUEs and BLEs to therapy gym for NMR reciprocal stepping pattern and overall endurance.    Gait training x175' with RW and steady assist with verbal cues for attention to objects in L field, especially when navigating through doorways.    NMR in standing with toe taps to target in semicircle with min/mod assist with pt demonstrating decreased R weight shift and coordination with LLE as well as posterior lean.  NMR standing with normal BOS while matching cards with close supervision/steady assist, progress to standing on wedge for anterior weight shift while matching cards with mod assist to maintain balance.    Pt returned to room in w/c at end of session and positioned upright with call bell in reach and needs met.   Therapy Documentation Precautions:  Precautions Precautions: Fall Restrictions Weight Bearing Restrictions: No  See Function Navigator for Current Functional Status.   Therapy/Group: Individual Therapy   E Penven-Crew 01/08/2016, 11:01 AM  

## 2016-01-08 NOTE — Progress Notes (Addendum)
Speech Language Pathology Daily Session Notes  Patient Details  Name: Kelly Wilson MRN: [id number] Date of Birth: [birth year]  Today's Date: 01/08/2016  Session 1 SLP Individual Time: 1100-1130 SLP Individual Time Calculation (min): 30 min Session 2 SLP Individual Time: 1435-1530 SLP Individual Time Calculation (min): 55 min  Short Term Goals: Week 2: SLP Short Term Goal 1 (Week 2): Patient will initiate functional tasks with Mod A verbal cues.  SLP Short Term Goal 2 (Week 2): Patient will demonstrate functional problem solving for basic and familiar tasks with Mod A verbal cues.  SLP Short Term Goal 3 (Week 2): Patient will utilize external memory aids to recall new, daily information with Mod A question and verbal cues.  SLP Short Term Goal 4 (Week 2): Patient will utilize call bell to request assistance in 25% of observable opportunities with Max A multimodal cues.   SLP Short Term Goal 5 (Week 2): Patient will self-monitor and correct errors during functional tasks/verbal conversation with Mod A question cues.  SLP Short Term Goal 6 (Week 2): Patient will demonstrate sustained attention to a functional task for ~15 minutes with Mod A verbal cues for redirection.   Skilled Therapeutic Interventions: Session 1 Skilled treatment session focused on addressing cognition goals. SLP facilitated session by providing Mod assist instructional cues to organize, initiate, self-monitor, and correct errors related to loading a medication box.  Patient able to verbalize need to go to the bathroom and required Min assist for safety with toilet transfer.  Patient left on toilet with nurse tech. Continue with current plan of care.   Session 2 Skilled treatment session focused on addressing cognition goals. As patient was not able to complete task in morning session it was continued this afternoon.  Patient was able to utilize external aid with Min verbal cues to locate place where she had left off.   SLP again facilitated session by providing instruction on how to organize for accuracy, self-monitor and correct errors with Max assist instructions cues faded to Supervision by end of task with patient identifying 2 ways to double check self and only requiring cues to problem solve solution for noticed error during last set of medication loading.  Patient demonstrated sustained attention to task for 45 minutes with Min verbal cues for redirection.  Continue with current plan of care.    Function:  Cognition Comprehension Comprehension assist level: Understands basic 50 - 74% of the time/ requires cueing 25 - 49% of the time  Expression   Expression assist level: Expresses basic 50 - 74% of the time/requires cueing 25 - 49% of the time. Needs to repeat parts of sentences.  Social Interaction Social Interaction assist level: Interacts appropriately 50 - 74% of the time - May be physically or verbally inappropriate.  Problem Solving Problem solving assist level: Solves basic 50 - 74% of the time/requires cueing 25 - 49% of the time  Memory Memory assist level: Recognizes or recalls 50 - 74% of the time/requires cueing 25 - 49% of the time    Pain Pain Assessment Pain Assessment: No/denies pain x2  Therapy/Group: Individual Therapy x2  Gunnar Fusi, M.A., [place] [id number]  [place] 01/08/2016, 12:20 PM

## 2016-01-09 ENCOUNTER — Inpatient Hospital Stay (HOSPITAL_COMMUNITY): Payer: BLUE CROSS/BLUE SHIELD | Admitting: Occupational Therapy

## 2016-01-09 ENCOUNTER — Inpatient Hospital Stay (HOSPITAL_COMMUNITY): Payer: BLUE CROSS/BLUE SHIELD | Admitting: Physical Therapy

## 2016-01-09 ENCOUNTER — Inpatient Hospital Stay (HOSPITAL_COMMUNITY): Payer: BLUE CROSS/BLUE SHIELD | Admitting: Speech Pathology

## 2016-01-09 LAB — CBC WITH DIFFERENTIAL/PLATELET
BASOS PCT: 1 %
Basophils Absolute: 0 10*3/uL (ref 0.0–0.1)
EOS ABS: 0.1 10*3/uL (ref 0.0–0.7)
Eosinophils Relative: 1 %
HEMATOCRIT: 35.6 % — AB (ref 36.0–46.0)
HEMOGLOBIN: 12 g/dL (ref 12.0–15.0)
LYMPHS ABS: 1.2 10*3/uL (ref 0.7–4.0)
Lymphocytes Relative: 30 %
MCH: 27.4 pg (ref 26.0–34.0)
MCHC: 33.7 g/dL (ref 30.0–36.0)
MCV: 81.3 fL (ref 78.0–100.0)
MONO ABS: 0.5 10*3/uL (ref 0.1–1.0)
MONOS PCT: 13 %
NEUTROS ABS: 2.2 10*3/uL (ref 1.7–7.7)
NEUTROS PCT: 55 %
Platelets: 277 10*3/uL (ref 150–400)
RBC: 4.38 MIL/uL (ref 3.87–5.11)
RDW: 13.9 % (ref 11.5–15.5)
WBC: 3.9 10*3/uL — ABNORMAL LOW (ref 4.0–10.5)

## 2016-01-09 LAB — BASIC METABOLIC PANEL
ANION GAP: 10 (ref 5–15)
BUN: 13 mg/dL (ref 6–20)
CO2: 21 mmol/L — ABNORMAL LOW (ref 22–32)
Calcium: 9.4 mg/dL (ref 8.9–10.3)
Chloride: 104 mmol/L (ref 101–111)
Creatinine, Ser: 0.74 mg/dL (ref 0.44–1.00)
GFR calc Af Amer: >60 mL/min (ref >60)
GFR calc non Af Amer: >60 mL/min (ref >60)
Glucose, Bld: 91 mg/dL (ref 65–99)
POTASSIUM: 3.8 mmol/L (ref 3.5–5.1)
SODIUM: 135 mmol/L (ref 135–145)

## 2016-01-09 NOTE — Progress Notes (Addendum)
Occupational Therapy Session Note  Patient Details  Name: Kelly Wilson MRN: [id number] Date of Birth: 1955-11-14  Today's Date: 01/09/2016 OT Individual Time: 0804-0900 and 1530-1555 OT Individual Time Calculation (min): 56 min and 25 min    Short Term Goals: Week 2:  OT Short Term Goal 1 (Week 2): STGs=LTGs seccondary to upcoming discharge  Skilled Therapeutic Interventions/Progress Updates:  Session 1: Upon entering the room, pt supine in bed with no c/o pain this session. Husband present but exiting soon after therapist arrival. Pt requesting to shower this session. Pt performing supine > sit from flat bed without use of bedrails and with supervision only. Pt performed sit >stand from bed with RW and supervision. She ambulated 10' to [employer] with RW and obtained clothing items with supervision. Pt then ambulated into bathroom for toileting tasks onto elevated toilet seat with supervision for hygiene and clothing management. Pt required steady assist for shower transfer as pt has posterior LOB in standing. Pt washed while seated on TTB with lateral leans to wash buttocks and peri area. Pt exited shower and dressed from EOB with steady assist needed for balance during LB clothing management. Pt sat in recliner chair at end of session with call bell and all needed items within reach.   Session 2: Upon entering the room, pt seated in recliner chair awaiting therapist. Pt sleeping and reports, "I am so tired." Pt oriented x 4 and able to verbalized therapist name and correctly stated what we did in previous session. Pt ambulated to bathroom but with RW and close supervision. Pt performing toilet transfer onto elevated toilet seat with supervision. Pt able to void and performed hygiene and clothing management with close supervision for safety. Pt ambulated to sink to wash hands with supervision for balance. Pt returning to bed at end of session with supervision for all bed mobility. Pt supine in bed  with call bell and all needed items within reach upon exiting the room. Bed alarm activated.   Therapy Documentation Precautions:  Precautions Precautions: Fall Restrictions Weight Bearing Restrictions: No  See Function Navigator for Current Functional Status.   Therapy/Group: Individual Therapy  Phineas Semen 01/09/2016, 10:01 AM

## 2016-01-09 NOTE — Patient Care Conference (Addendum)
Inpatient RehabilitationTeam Conference and Plan of Care Update Date: 01/08/2016   Time: 2:40 PM    Patient Name: Kelly Wilson      Medical Record Number: [id number]  Date of Birth: April 24, 1956 Sex: Female         Room/Bed: 4M10C/4M10C-01 Payor Info: Payor: BLUE CROSS BLUE SHIELD / Plan: BCBS OTHER / Product Type: *No Product type* /    Admitting Diagnosis: ICH ADMIT  Admit Date/Time:  12/27/2015  4:53 PM Admission Comments: No comment available   Primary Diagnosis:  IVH (intraventricular hemorrhage) (H[facility]) Principal Problem: IVH (intraventricular hemorrhage) ([place])  Patient Active Problem List   Diagnosis Date Noted  . Bradycardia   . Acute lower UTI   . Agitation   . Lethargic   . Lethargy   . Bilateral headaches   . Other vascular headache   . Hypokalemia   . Heat rash   . Poor appetite   . Hyponatremia   . Cephalalgia   . Obstructive hydrocephalus 12/17/2015  . 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[place]) 12/17/2015  . Headache 12/17/2015  . IVH (intraventricular hemorrhage) ([place]) 12/16/2015  . Chest pain 05/10/2015  . Precordial chest pain 05/10/2015  . Chest wall pain 05/10/2015  . Pain in the chest   . Wellness examination 01/24/2015  . Myalgia 11/14/2014  . Muscle cramps 11/14/2014  . Pinched nerve 08/28/2014  . Routine general medical examination at a health care facility 08/12/2013  . Cough 04/21/2012  . Lymphadenitis, acute 10/23/2011  . GERD 08/09/2008  . OTHER DYSPHAGIA 06/18/2008  . 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 NECK DISORDER 05/22/2008  . HEADACHE 08/16/2007  . OTITIS MEDIA, ACUTE, LEFT 08/11/2007  . OTHER ABNORMAL BLOOD CHEMISTRY 07/25/2007  . HYPERLIPIDEMIA 05/17/2007  . Essential hypertension 05/17/2007  . OSTEOARTHRITIS 05/17/2007  . SPINAL STENOSIS 05/17/2007  . History of cardiovascular disorder 05/17/2007    Expected Discharge Date: Expected Discharge Date: 01/15/16  Team Members Present: Physician leading conference: Dr. 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 of b/b LBM 01/04/16  min assist  cont. to monitor q shift   Swallow/Nutrition/ Hydration             ADL's   min A LB self care, set up A UB self care, min A shower transfers and toilet transfers, min multimodal cues for self care  supervision overall  pt/family edu, ADL training, balance, activity tolerance   Mobility   steady assist transfers and gait  supervision overall  balance, transfers, gait, NMR, postural control    Communication             Safety/Cognition/ Behavioral Observations  no unsafe behavior  supervision  cont. to monitor q shift   Pain   no complaints of pain  <3  cont. to monitor q shift   Skin   rash to back and buttocks with nystatin powder  no skin breakdown   cont. with plan of care    Rehab Goals Patient on target to meet rehab goals: Yes *See Care Plan  and progress notes for long and short-term goals.  Barriers to Discharge: poor appetite, rash, hypokalemia, appetite, UTI    Possible Resolutions to Barriers:  Folllow labs, adjust meds for appetite, hygiene for rash, treating UTI    Discharge Planning/Teaching Needs:  Plan for pt to d/c home with husband and family to provide 24/7 assistance.  Teaching to be scheduled closer to d/c   Team Discussion:  Still on meds for arousal and megace for appetite.  Still fluctuates with orientation esp when she just awakens.  Light assist with gait and anticipating  supervision by next week.  Currently supervision/ min assist.    Revisions to Treatment Plan:  None   Continued Need for Acute Rehabilitation Level of Care: The patient requires daily medical management by a physician with specialized training in physical medicine and rehabilitation for the following conditions: Daily direction of a multidisciplinary physical rehabilitation program to ensure safe treatment while eliciting the highest outcome that is of practical value to the patient.: Yes Daily medical management of patient stability for increased activity during participation in an intensive rehabilitation regime.: Yes Daily analysis of laboratory values and/or radiology reports with any subsequent need for medication adjustment of medical intervention for : Post surgical problems;Neurological problems;Nutritional problems  ,  01/09/2016, 2:19 PM

## 2016-01-09 NOTE — Progress Notes (Signed)
Physical Therapy Session Note  Patient Details  Name: Kelly Wilson MRN: [id number] Date of Birth: 03/28/1956  Today's Date: 01/09/2016 PT Individual Time: 1105-1205 PT Individual Time Calculation (min): 60 min   Short Term Goals: Week 2:  PT Short Term Goal 1 (Week 2): =LTGs  Skilled Therapeutic Interventions/Progress Updates:    Pt received sleeping in recliner, easily awakes to voice, c/o mild headache pain but agreeable to therapy session.  Session focus on gait training, NMR, balance, stair negotiation, and pt education.    Pt transfers sit<>stand throughout session with RW and close supervision with verbal cues to maintain forward weight shift throughout transfer.    Gait training throughout unit, max distance 230', with RW and close supervision with incidental steady assist during turns.  Pt with no major LOB and able to correct self with supervision.  Stair negotiation x4 steps, 1 rail to ascend, 2 to descend.  Pt with increased fear descending stairs and lowered self to top step to "sit and rest."  PT provided pt education regarding current deficits with balance and methods to increase safety and confidence with all mobility.  Pt verbalized understanding and states she feels more confident.  Pt returned to standing with BUE support and descended stairs with close supervision.    NMR seated and standing on kinetron at 30-50 cm/s for reciprocal stepping, forced use, and balance.  NMR on biodex for balance retraining playing catch game 3 trials of 1 minute each with improvement noted in score and reaction time even with progressing difficulty to medium.    Pt returned to room at end of session and positioned in recliner with call bell in reach and needs met.   Therapy Documentation Precautions:  Precautions Precautions: Fall Restrictions Weight Bearing Restrictions: No   See Function Navigator for Current Functional Status.   Therapy/Group: Individual Therapy   E  Penven-Crew 01/09/2016, 12:17 PM  

## 2016-01-09 NOTE — Progress Notes (Signed)
Speech Language Pathology Daily Session Note  Patient Details  Name: Kelly Wilson MRN: [id number] Date of Birth: December 27, 1955  Today's Date: 01/09/2016 SLP Individual Time: 1345-1430 SLP Individual Time Calculation (min): 45 min  Short Term Goals: Week 2: SLP Short Term Goal 1 (Week 2): Patient will initiate functional tasks with Mod A verbal cues.  SLP Short Term Goal 2 (Week 2): Patient will demonstrate functional problem solving for basic and familiar tasks with Mod A verbal cues.  SLP Short Term Goal 3 (Week 2): Patient will utilize external memory aids to recall new, daily information with Mod A question and verbal cues.  SLP Short Term Goal 4 (Week 2): Patient will utilize call bell to request assistance in 25% of observable opportunities with Max A multimodal cues.   SLP Short Term Goal 5 (Week 2): Patient will self-monitor and correct errors during functional tasks/verbal conversation with Mod A question cues.  SLP Short Term Goal 6 (Week 2): Patient will demonstrate sustained attention to a functional task for ~15 minutes with Mod A verbal cues for redirection.   Skilled Therapeutic Interventions: Pt seen for skilled SLP therapy with focus on cognitive linguistic goals. Pt required mod A to orient to date using schedule and mod A to read schedule appropriately. At the conclusion of the session, pt was unable to recall basic information re: therapy schedule that was discussed 30 min prior. General sustained attention to a 15 min activity was adequate with occasional verbal cueing. Pt demonstrated spelling errors without awareness in 8/10 common words. Pt was able to verbally spell with words with 80% acc, but when writing the words, she consistently inserted extra letters. Awareness to errors increased with mod A and strategy of speeling aloud while writing a letter at a time was implemented. Will build with 4-letter words.    Function:  Eating Eating   Modified Consistency Diet:  No Eating Assist Level: Supervision or verbal cues           Cognition Comprehension Comprehension assist level: Understands basic 50 - 74% of the time/ requires cueing 25 - 49% of the time  Expression   Expression assist level: Expresses basic 75 - 89% of the time/requires cueing 10 - 24% of the time. Needs helper to occlude trach/needs to repeat words.  Social Interaction Social Interaction assist level: Interacts appropriately 90% of the time - Needs monitoring or encouragement for participation or interaction.  Problem Solving Problem solving assist level: Solves basic 50 - 74% of the time/requires cueing 25 - 49% of the time  Memory Memory assist level: Recognizes or recalls 50 - 74% of the time/requires cueing 25 - 49% of the time    Pain Pain Assessment Pain Assessment: No/denies pain  Therapy/Group: Individual Therapy  Vinetta Bergamo MA, CCC-SLP 01/09/2016, 3:59 PM

## 2016-01-09 NOTE — Progress Notes (Signed)
PHYSICAL MEDICINE & REHABILITATION     PROGRESS NOTE    Subjective/Complaints: Patient sitting up at the edge of the bed this morning. She is much more alert and interactive. She ate better this morning as well. She is making jokes.  ROS: Denies CP, COB, N/V/D.  Objective: Vital Signs: Blood pressure 144/61, pulse 74, temperature 97.6 F (36.4 C), temperature source Oral, resp. rate 17, height 5\' 6"  (1.676 m), weight 105.2 kg (231 lb 14.8 oz), SpO2 96 %. No results found.  Recent Labs  01/07/16 0544 01/09/16 0714  WBC 5.4 3.9*  HGB 12.9 12.0  HCT 38.1 35.6*  PLT 306 277    Recent Labs  01/07/16 0544 01/09/16 0714  NA 134* 135  K 2.9* 3.8  CL 101 104  GLUCOSE 100* 91  BUN 12 13  CREATININE 0.76 0.74  CALCIUM 9.4 9.4   CBG (last 3)  No results for input(s): GLUCAP in the last 72 hours.  Wt Readings from Last 3 Encounters:  01/09/16 105.2 kg (231 lb 14.8 oz)  12/16/15 108.863 kg (240 lb)  11/05/15 114.306 kg (252 lb)    Physical Exam:  Constitutional: She appears well-developed. No distress. Obese  HENT: Normocephalic and atraumatic.  Eyes: EOM and Conj are normal.  Neck: Normal range of motion. Neck supple. No JVD present. No tracheal deviation present. No thyromegaly present.  Posterior head incision intact  Cardiovascular: regular rhythm and rate.  Respiratory: Effort normal and breath sounds normal. No respiratory distress.  GI: Soft. Bowel sounds are normal. She exhibits no distension.  Neurological: She is alert and oriented.    Strength 4+-5/5 bilateral upper ext and 4+-5/5 HF, 4+-5/5 KE and 5/5 ADFPF.  Skin: She is not diaphoretic.  Surgical incision c/d/i. Red/macular rash on back, resolved.  Inner thighs with moisture related rash Psychiatric: She has a flat affect.      Assessment/Plan: 1. Impaired functional mobility and self-care secondary to IVH/hydrocephalus which require 3+ hours per day of interdisciplinary therapy in a  comprehensive inpatient rehab setting. Physiatrist is providing close team supervision and 24 hour management of active medical problems listed below. Physiatrist and rehab team continue to assess barriers to discharge/monitor patient progress toward functional and medical goals.  Function:  Bathing Bathing position Bathing activity did not occur: Refused Position: Production manager parts bathed by patient: Chest, Abdomen, Front perineal area, Left upper leg, Right upper leg, Buttocks, Right arm, Left arm, Right lower leg, Left lower leg Body parts bathed by helper: Back  Bathing assist Assist Level: Touching or steadying assistance(Pt > 75%)      Upper Body Dressing/Undressing Upper body dressing   What is the patient wearing?: Bra, Pull over shirt/dress Bra - Perfomed by patient: Thread/unthread right bra strap, Hook/unhook bra (pull down sports bra), Thread/unthread left bra strap Bra - Perfomed by helper: Hook/unhook bra (pull down sports bra) Pull over shirt/dress - Perfomed by patient: Thread/unthread right sleeve, Thread/unthread left sleeve, Put head through opening, Pull shirt over trunk Pull over shirt/dress - Perfomed by helper: Pull shirt over trunk        Upper body assist Assist Level: Set up   Set up : To obtain clothing/put away  Lower Body Dressing/Undressing Lower body dressing   What is the patient wearing?: Pants, Underwear, Non-skid slipper socks Underwear - Performed by patient: Thread/unthread right underwear leg, Thread/unthread left underwear leg, Pull underwear up/down Underwear - Performed by helper: Pull underwear up/down Pants- Performed by patient:  Thread/unthread right pants leg, Thread/unthread left pants leg, Pull pants up/down Pants- Performed by helper: Thread/unthread right pants leg, Thread/unthread left pants leg Non-skid slipper socks- Performed by patient: Don/doff right sock, Don/doff left sock Non-skid slipper socks- Performed by  helper: Don/doff right sock, Don/doff left sock Socks - Performed by patient: Don/doff right sock, Don/doff left sock   Shoes - Performed by patient: Don/doff right shoe, Don/doff left shoe Shoes - Performed by helper: Fasten right, Fasten left       TED Hose - Performed by helper: Don/doff right TED hose, Don/doff left TED hose  Lower body assist Assist for lower body dressing: Touching or steadying assistance (Pt > 75%)      Toileting Toileting   Toileting steps completed by patient: Adjust clothing prior to toileting, Performs perineal hygiene, Adjust clothing after toileting Toileting steps completed by helper: Adjust clothing after toileting, Performs perineal hygiene Toileting Assistive Devices: Grab bar or rail  Toileting assist Assist level: Touching or steadying assistance (Pt.75%)   Transfers Chair/bed transfer   Chair/bed transfer method: Ambulatory Chair/bed transfer assist level: Touching or steadying assistance (Pt > 75%) Chair/bed transfer assistive device: Armrests, Medical sales representative     Max distance: 175 Assist level: Touching or steadying assistance (Pt > 75%)   Wheelchair   Type: Manual Max wheelchair distance: 150 Assist Level: No help, No cues, assistive device, takes more than reasonable amount of time  Cognition Comprehension Comprehension assist level: Understands basic 50 - 74% of the time/ requires cueing 25 - 49% of the time  Expression Expression assist level: Expresses basic 50 - 74% of the time/requires cueing 25 - 49% of the time. Needs to repeat parts of sentences.  Social Interaction Social Interaction assist level: Interacts appropriately 50 - 74% of the time - May be physically or verbally inappropriate.  Problem Solving Problem solving assist level: Solves basic 50 - 74% of the time/requires cueing 25 - 49% of the time  Memory Memory assist level: Recognizes or recalls 50 - 74% of the time/requires cueing 25 - 49% of the time    Medical Problem List and Plan: 1. 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   2. DVT Prophylaxis/Anticoagulation: Subcutaneous Lovenox initiated 12/22/2015. Monitor for any bleeding episodes 3. Pain Management: Neurontin 900 mg every 8 hours.  4. Hypertension. Maxide daily,Nimotop 60 mg every 4 hours 21 days initiated 12/17/2015  Will cont to monitor, overall controlled 5. Neuropsych: This patient is capable of making decisions on her own behalf. 6. Skin/Wound Care: Routine skin checks  Ensure hygiene for back and groin rashes.  Cream to inner groins. 7. Fluids/Electrolytes/Nutrition:  Discussed intake with patient   Added megace to help stimulate appetite. Continue to encourage intake.  Hypokalemia: 3.8 on 6/22 after supplementation. Will continue to monitor  Boost started on 6/20 8. Seizure prophylaxis. Keppra 500 mg twice a day, valproic acid 500 mg daily. EEG negative 9. Hyponatremia.   Continue sodium chloride tablets for now.  Na 135 on 6/22 (stable range) 10. Heat rash--Improved  Continue hypoallergenic sheets 11. UTI  UA positive for bacteria, U culture multi-species  Completed Macrobid  LOS (Days) 13 A FACE TO FACE EVALUATION WAS PERFORMED  Ankit Lorie Phenix 01/09/2016 9:01  AM

## 2016-01-09 NOTE — Plan of Care (Signed)
Problem: RH Wheelchair Mobility Goal: LTG Patient will propel w/c in home environment (PT) LTG: Patient will propel wheelchair in home environment, # of feet with assistance (PT).  Outcome: Not Applicable Date Met:  79/81/02 D/C, pt is ambulating >150' and will not require w/c in home

## 2016-01-09 NOTE — Progress Notes (Signed)
Social Work Patient ID: Charlott Rakes, female   DOB: [birth year], 60 y.o.   MRN: [id number]   Reviewed team conference yesterday with pt and left VM for daughter, Eustaquio Maize.  Reported that we continue to plan for 6/28 d/c date with supervision goals.  Pt MUCH brighter this week and engaged.  She is "thrilled" to know she is leaving next week.  Informed daughter that we need to plan for family ed next week prior to d/c.  , , LCSW

## 2016-01-10 ENCOUNTER — Inpatient Hospital Stay (HOSPITAL_COMMUNITY): Payer: BLUE CROSS/BLUE SHIELD | Admitting: Occupational Therapy

## 2016-01-10 ENCOUNTER — Inpatient Hospital Stay (HOSPITAL_COMMUNITY): Payer: BLUE CROSS/BLUE SHIELD | Admitting: Physical Therapy

## 2016-01-10 ENCOUNTER — Inpatient Hospital Stay (HOSPITAL_COMMUNITY): Payer: BLUE CROSS/BLUE SHIELD | Admitting: Speech Pathology

## 2016-01-10 ENCOUNTER — Inpatient Hospital Stay (HOSPITAL_COMMUNITY): Payer: BLUE CROSS/BLUE SHIELD

## 2016-01-10 NOTE — Progress Notes (Signed)
Occupational Therapy Session Note  Patient Details  Name: Kelly Wilson MRN: [id number] Date of Birth: June 30, 1956  Today's Date: 01/10/2016 OT Individual Time:  -   1100-1210  (70 min)      Short Term Goals: Week 1:  OT Short Term Goal 1 (Week 1): Pt will perform toilet transfer with min A in order to increase I with functional transfers. OT Short Term Goal 1 - Progress (Week 1): Progressing toward goal OT Short Term Goal 2 (Week 1): Pt will perform clothing management during toileting with steady assist for balance. OT Short Term Goal 2 - Progress (Week 1): Progressing toward goal OT Short Term Goal 3 (Week 1): Pt will perform UB dressing with min A in order to increase I with functional tasks.  OT Short Term Goal 3 - Progress (Week 1): Met OT Short Term Goal 4 (Week 1): Pt will perform toilet transfer with min A in order to increae I with functional transfers.  OT Short Term Goal 4 - Progress (Week 1): Progressing toward goal     Skilled Therapeutic Interventions/Progress Updates:    Skilled OT intervention for cognitive retraining, balance, endurance, functional mobility.  Pt engaged in ambulation activities with RW and without RW.  She ambulated with min assist to therapy gym.  Engaged in game of horseshoes without walker.   Provided cues and min guard for pt to step forward and back when tossing the horseshoe.  Pt picked up horseshoes for 3 games.  She rested after 2 games plus picking up horseshoes.  Engaged in [place] chi balance and slow stepping using yellow theraband squares.  Pt did with min assist and no RW.  Had difficulty with longer stepping but after 3 games, she progressed to [place].  Pt ambulated back to room .  Did side stepping to get to top of bed.  Left all needs in  Reach.   Therapy Documentation Precautions:  Precautions Precautions: Fall Restrictions Weight Bearing Restrictions: No   Pain: none          See Function Navigator for Current Functional  Status.   Therapy/Group: Individual Therapy  Lisa Roca 01/10/2016, 11:15 AM

## 2016-01-10 NOTE — Progress Notes (Signed)
Occupational Therapy Note  Patient Details  Name: UNIKA HOLDCROFT MRN: [id number] Date of Birth: [birth year]  Today's Date: 01/10/2016 OT Individual Time: 1300-1330 OT Individual Time Calculation (min): 30 min   Pt denied pain Individual Therapy  Pt resting in bed upon arrival.  Pt stated that she was sleepy/tired but agreeable to participating in therapy.  Pt amb with RW to ADL apartment and practiced simple home mgmt tasks while amb with RW.  Pt initially attempted to carry pillow cases in her hand to transport to soiled linen bag.  Educated patient on safe way to transport items when using her RW.  Discussed use of walker bag.  Pt verbalized understanding.  Pt transported pillow cases to therapy gym utilized learned strategy.  Pt engaged in dynamic standing task while standing on non-compliant surface requiring steady A.  Pt amb with RW back to room at supervision level and returned to bed.  Pt remained in bed with alarm activated and all needs within reach.    [facility] 01/10/2016, 1:33 PM

## 2016-01-10 NOTE — Progress Notes (Signed)
[facility] PHYSICAL MEDICINE & REHABILITATION     PROGRESS NOTE    Subjective/Complaints: Pt seen lying in bed this morning. She was under the impression that she was going to be discharged today and would like to go home.  ROS: Denies CP, COB, N/V/D.  Objective: Vital Signs: Blood pressure 144/64, pulse 74, temperature 98.3 F (36.8 C), temperature source Oral, resp. rate 17, height 5\' 6"  (1.676 m), weight 95.3 kg (210 lb 1.6 oz), SpO2 97 %. No results found.  Recent Labs  01/09/16 0714  WBC 3.9*  HGB 12.0  HCT 35.6*  PLT 277    Recent Labs  01/09/16 0714  NA 135  K 3.8  CL 104  GLUCOSE 91  BUN 13  CREATININE 0.74  CALCIUM 9.4   CBG (last 3)  No results for input(s): GLUCAP in the last 72 hours.  Wt Readings from Last 3 Encounters:  01/10/16 95.3 kg (210 lb 1.6 oz)  12/16/15 108.863 kg (240 lb)  11/05/15 114.306 kg (252 lb)    Physical Exam:  Constitutional: She appears well-developed. No distress. Obese  HENT: Normocephalic and atraumatic.  Eyes: EOM and Conj are normal.  Neck: Normal range of motion. Neck supple. No JVD present. No tracheal deviation present. No thyromegaly present.  Posterior head incision intact  Cardiovascular: regular rhythm and rate.  Respiratory: Effort normal and breath sounds normal. No respiratory distress.  GI: Soft. Bowel sounds are normal. She exhibits no distension.  Neurological: She is alert and oriented.    Strength 4+-5/5 bilateral upper ext and 4+-5/5 HF, 4+-5/5 KE and 5/5 ADFPF.  Skin: She is not diaphoretic.  Surgical incision c/d/i. Red/macular rash on back, resolved.  Inner thighs with moisture related rash Psychiatric: She has a flat affect.      Assessment/Plan: 1. Impaired functional mobility and self-care secondary to IVH/hydrocephalus which require 3+ hours per day of interdisciplinary therapy in a comprehensive inpatient rehab setting. Physiatrist is providing close team supervision and 24 hour management  of active medical problems listed below. Physiatrist and rehab team continue to assess barriers to discharge/monitor patient progress toward functional and medical goals.  Function:  Bathing Bathing position Bathing activity did not occur: Refused Position: Shower  Bathing parts Body parts bathed by patient: Chest, Abdomen, Front perineal area, Left upper leg, Right upper leg, Buttocks, Right arm, Left arm, Right lower leg, Left lower leg Body parts bathed by helper: Back  Bathing assist Assist Level: Supervision or verbal cues      Upper Body Dressing/Undressing Upper body dressing   What is the patient wearing?: Bra, Pull over shirt/dress Bra - Perfomed by patient: Thread/unthread right bra strap, Hook/unhook bra (pull down sports bra), Thread/unthread left bra strap Bra - Perfomed by helper: Hook/unhook bra (pull down sports bra) Pull over shirt/dress - Perfomed by patient: Thread/unthread right sleeve, Thread/unthread left sleeve, Put head through opening, Pull shirt over trunk Pull over shirt/dress - Perfomed by helper: Pull shirt over trunk        Upper body assist Assist Level: Supervision or verbal cues   Set up : To obtain clothing/put away  Lower Body Dressing/Undressing Lower body dressing   What is the patient wearing?: Pants, Underwear, Socks Underwear - Performed by patient: Thread/unthread right underwear leg, Thread/unthread left underwear leg, Pull underwear up/down Underwear - Performed by helper: Pull underwear up/down Pants- Performed by patient: Thread/unthread right pants leg, Thread/unthread left pants leg, Pull pants up/down Pants- Performed by helper: Thread/unthread right pants leg, Thread/unthread  left pants leg Non-skid slipper socks- Performed by patient: Don/doff right sock, Don/doff left sock Non-skid slipper socks- Performed by helper: Don/doff right sock, Don/doff left sock Socks - Performed by patient: Don/doff right sock, Don/doff left sock    Shoes - Performed by patient: Don/doff right shoe, Don/doff left shoe Shoes - Performed by helper: Fasten right, Fasten left       TED Hose - Performed by helper: Don/doff right TED hose, Don/doff left TED hose  Lower body assist Assist for lower body dressing: Touching or steadying assistance (Pt > 75%)      Toileting Toileting   Toileting steps completed by patient: Adjust clothing prior to toileting Toileting steps completed by helper: Performs perineal hygiene, Adjust clothing after toileting Toileting Assistive Devices: Grab bar or rail  Toileting assist Assist level: Touching or steadying assistance (Pt.75%)   Transfers Chair/bed transfer   Chair/bed transfer method: Ambulatory Chair/bed transfer assist level: Supervision or verbal cues Chair/bed transfer assistive device: Armrests, Medical sales representative     Max distance: 230 Assist level: Supervision or verbal cues   Wheelchair   Type: Manual Max wheelchair distance: 150 Assist Level: No help, No cues, assistive device, takes more than reasonable amount of time  Cognition Comprehension Comprehension assist level: Understands basic 75 - 89% of the time/ requires cueing 10 - 24% of the time  Expression Expression assist level: Expresses basic 75 - 89% of the time/requires cueing 10 - 24% of the time. Needs helper to occlude trach/needs to repeat words.  Social Interaction Social Interaction assist level: Interacts appropriately 75 - 89% of the time - Needs redirection for appropriate language or to initiate interaction.  Problem Solving Problem solving assist level: Solves basic 90% of the time/requires cueing < 10% of the time  Memory Memory assist level: Recognizes or recalls 75 - 89% of the time/requires cueing 10 - 24% of the time   Medical Problem List and Plan: 1. 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   2. DVT Prophylaxis/Anticoagulation: Subcutaneous Lovenox initiated 12/22/2015. Monitor for any bleeding episodes 3. Pain Management: Neurontin 900 mg every 8 hours.  4. Hypertension. Maxide daily,Nimotop 60 mg every 4 hours 21 days initiated 12/17/2015  Will cont to monitor, overall controlled 5. Neuropsych: This patient is capable of making decisions on her own behalf. 6. Skin/Wound Care: Routine skin checks  Ensure hygiene for back and groin rashes.  Cream to inner groins. 7. Fluids/Electrolytes/Nutrition:  Discussed intake with patient   Ate 25-75% of her meals yesterday  Added megace to help stimulate appetite. Continue to encourage intake.  Hypokalemia: 3.8 on 6/22 after supplementation. Will continue to monitor  Boost started on 6/20 8. Seizure prophylaxis. Keppra 500 mg twice a day, valproic acid 500 mg daily. EEG negative 9. Hyponatremia.   Continue sodium chloride tablets for now.  Na 135 on 6/22 (stable range) 10. Heat rash--Improved  Continue hypoallergenic sheets 11. UTI  UA positive for bacteria, U culture multi-species  Completed Macrobid  LOS (Days) 14 A FACE TO FACE EVALUATION WAS PERFORMED   Lorie Phenix 01/10/2016 9:04 AM

## 2016-01-10 NOTE — Progress Notes (Signed)
Speech Language Pathology Weekly Progress and Session Note  Patient Details  Name: Kelly Wilson MRN: [id number] Date of Birth: Apr 22, 1956  Beginning of progress report period: January 03, 2016 End of progress report period: January 10, 2016  Today's Date: 01/10/2016 SLP Individual Time: 1430-1500 SLP Individual Time Calculation (min): 30 min  Short Term Goals: Week 2: SLP Short Term Goal 1 (Week 2): Patient will initiate functional tasks with Mod A verbal cues.  SLP Short Term Goal 1 - Progress (Week 2): Met SLP Short Term Goal 2 (Week 2): Patient will demonstrate functional problem solving for basic and familiar tasks with Mod A verbal cues.  SLP Short Term Goal 2 - Progress (Week 2): Met SLP Short Term Goal 3 (Week 2): Patient will utilize external memory aids to recall new, daily information with Mod A question and verbal cues.  SLP Short Term Goal 3 - Progress (Week 2): Progressing toward goal SLP Short Term Goal 4 (Week 2): Patient will utilize call bell to request assistance in 25% of observable opportunities with Max A multimodal cues.   SLP Short Term Goal 4 - Progress (Week 2): Met SLP Short Term Goal 5 (Week 2): Patient will self-monitor and correct errors during functional tasks/verbal conversation with Mod A question cues.  SLP Short Term Goal 5 - Progress (Week 2): Met SLP Short Term Goal 6 (Week 2): Patient will demonstrate sustained attention to a functional task for ~15 minutes with Mod A verbal cues for redirection.  SLP Short Term Goal 6 - Progress (Week 2): Met    New Short Term Goals: Week 3: SLP Short Term Goal 1 (Week 3): Patient will utilize external memory aids to recall new, daily information with Mod A question and verbal cues.  SLP Short Term Goal 2 (Week 3): Patient will self-monitor and correct errors during functional tasks/verbal conversation with min A question cues.  SLP Short Term Goal 3 (Week 3): Patient will demonstrate functional problem solving for  basic and familiar tasks with min A verbal cues.  SLP Short Term Goal 4 (Week 3): Pt will demonstrate writing at the sentence level with use of compensatory strategy for spelling with min A. SLP Short Term Goal 5 (Week 3): Pt will complete basic functional math with mod A.  Weekly Progress Updates:  Pt continues to improve with awareness of deficits, however she continues to require mod - min A to complete most cognitive-linguistic activities. Pt is amenable to using compensatory strategies to target areas of deficit with excellent success, but requires cueing to consistently use strategies.  Anticipate that pt will continue to make gains with SLP services, but will require 24/7 supervision upon discharge from CIR.    Intensity: Minumum of 1-2 x/day, 30 to 90 minutes Frequency: 3 to 5 out of 7 days Duration/Length of Stay: 01/15/16 Treatment/Interventions: Cognitive remediation/compensation;Cueing hierarchy;Functional tasks;Patient/family education;Therapeutic Activities;Internal/external aids;Environmental controls   Daily Session  Skilled Therapeutic Interventions: Pt seen for skilled SLP therapy with focus on cognitive linguistic goals. Pt unable to recall goals of SLP therapy session today as established yesterday despite mod cueing. After reminder pt was able to provide additional recalled details for evidence of actual recall. General sustained attention to a 15 min activity was adequate with occasional verbal cueing. Pt required mod verbal cueing to utilize strategy of spelling aloud while writing a letter at a time- however with the strategy she was able to write words with 95% acc. Frequent deflection noted to decrease attention to deficits.  Function:   Eating Eating                 Cognition Comprehension Comprehension assist level: Understands basic 75 - 89% of the time/ requires cueing 10 - 24% of the time  Expression   Expression assist level: Expresses basic 75 - 89%  of the time/requires cueing 10 - 24% of the time. Needs helper to occlude trach/needs to repeat words.  Social Interaction Social Interaction assist level: Interacts appropriately 90% of the time - Needs monitoring or encouragement for participation or interaction.  Problem Solving Problem solving assist level: Solves basic 50 - 74% of the time/requires cueing 25 - 49% of the time  Memory Memory assist level: Recognizes or recalls 50 - 74% of the time/requires cueing 25 - 49% of the time   General    Pain Pain Assessment Pain Assessment: No/denies pain  Therapy/Group: Individual Therapy  Vinetta Bergamo MA, CCC-SLP 01/10/2016, 3:55 PM

## 2016-01-10 NOTE — Progress Notes (Signed)
Physical Therapy Session Note  Patient Details  Name: Kelly Wilson MRN: [id number] Date of Birth: 1956-02-19  Today's Date: 01/10/2016 PT Individual Time: 0900-1000 PT Individual Time Calculation (min): 60 min   Short Term Goals: Week 2:  PT Short Term Goal 1 (Week 2): =LTGs  Skilled Therapeutic Interventions/Progress Updates:    Pt received resting in bed, RN finishing distributing morning meds.  Session focus on intellectual awareness, problem solving, gait training, balance, and transfers.  Pt engaged in conversation regarding ongoing impairments in functional mobility, pt not able to recall any of her functional limitations without max cues.   Pt transitioned to sitting EOB and donned shoes with distant supervision.  Pt performed sit<>stand with supervision and amb to therapy gym with close supervision and verbal cues for navigation.  Pt unable to recall location of therapy gym without mod verbal cues.    Gait training through obstacle course x2 with initial steady assist, progress to close supervision with mod progress to min verbal cues for safe negotiation of obstacles.    PT administered BERG Balance Scale with results noted below.  Patient demonstrates high fall risk as noted by score of 33/56 on Berg Balance Scale.  PT explained results to pt and recommended ongoing use of RW and supervision from family when ambulating at home.  Education to continue throughout remainder of stay given pt's cognitive deficits.    Static standing balance on foam while working on problem solving with pipe tree.  Pt able to complete 2 designs with pipe tree with supervision for problem solving, and steady>mod assist for balance.    Pt returned to room at end of session and positioned upright in recliner with call bell in reach and needs met.   Therapy Documentation Precautions:  Precautions Precautions: Fall Restrictions Weight Bearing Restrictions: No  Balance: Standardized Balance  Assessment Standardized Balance Assessment: Berg Balance Test Berg Balance Test Sit to Stand: Able to stand  independently using hands Standing Unsupported: Able to stand 2 minutes with supervision Sitting with Back Unsupported but Feet Supported on Floor or Stool: Able to sit safely and securely 2 minutes Stand to Sit: Controls descent by using hands Transfers: Able to transfer safely, definite need of hands Standing Unsupported with Eyes Closed: Able to stand 10 seconds with supervision Standing Ubsupported with Feet Together: Able to place feet together independently but unable to hold for 30 seconds From Standing, Reach Forward with Outstretched Arm: Can reach forward >12 cm safely (5") From Standing Position, Pick up Object from Floor: Able to pick up shoe, needs supervision From Standing Position, Turn to Look Behind Over each Shoulder: Looks behind from both sides and weight shifts well Turn 360 Degrees: Needs assistance while turning Standing Unsupported, Alternately Place Feet on Step/Stool: Needs assistance to keep from falling or unable to try Standing Unsupported, One Foot in Front: Able to take small step independently and hold 30 seconds Standing on One Leg: Unable to try or needs assist to prevent fall Total Score: 33   See Function Navigator for Current Functional Status.   Therapy/Group: Individual Therapy  Earnest Conroy Penven-Crew 01/10/2016, 12:23 PM

## 2016-01-11 ENCOUNTER — Inpatient Hospital Stay (HOSPITAL_COMMUNITY): Payer: BLUE CROSS/BLUE SHIELD | Admitting: Speech Pathology

## 2016-01-11 ENCOUNTER — Inpatient Hospital Stay (HOSPITAL_COMMUNITY): Payer: BLUE CROSS/BLUE SHIELD | Admitting: *Deleted

## 2016-01-11 NOTE — Progress Notes (Signed)
Physical Therapy Session Note  Patient Details  Name: Kelly Wilson MRN: [id number] Date of Birth: [birth year]  Today's Date: 01/11/2016 PT Individual Time: 1000-1100 PT Individual Time Calculation (min): 60 min     Skilled Therapeutic Interventions/Progress Updates:  Patient in room , sitting in recliner agrees to therapy session, gait with walker to gym with supervision and cues for path following and directional cues.  NuStep x 10 min with resistance of 5 in order to facilitate reciprocal movements.  Patient requested to use bathroom ,guided through the bathroom with min A for donning and doffing clothes, patient is not very eager to continue therapy but with increased encouragement agrees.  Balance training with standing on foam wedge , sit to stand x 5, no use of UE, turning and reaching side ways all with in to mod A due to posterior balance loss.  Alternating step ups with min A, stepping strategy used to recover posterior balance loss, decreased ankle/knee /hip rigthening reactions, decreased awarness of position in space.  At the end of session patient returned to room, left sitting in a recliner with all needs within reach .   Therapy Documentation Precautions:  Precautions Precautions: Fall Restrictions Weight Bearing Restrictions: No   See Function Navigator for Current Functional Status.   Therapy/Group: Individual Therapy  [place] Spanish 01/11/2016, 12:45 PM

## 2016-01-11 NOTE — Progress Notes (Signed)
Speech Language Pathology Daily Session Note  Patient Details  Name: Kelly Wilson MRN: [id number] Date of Birth: April 15, 1956  Today's Date: 01/11/2016 SLP Individual Time: 1330-1400 SLP Individual Time Calculation (min): 30 min  Short Term Goals: Week 3: SLP Short Term Goal 1 (Week 3): Patient will utilize external memory aids to recall new, daily information with Mod A question and verbal cues.  SLP Short Term Goal 2 (Week 3): Patient will self-monitor and correct errors during functional tasks/verbal conversation with min A question cues.  SLP Short Term Goal 3 (Week 3): Patient will demonstrate functional problem solving for basic and familiar tasks with min A verbal cues.  SLP Short Term Goal 4 (Week 3): Pt will demonstrate writing at the sentence level with use of compensatory strategy for spelling with min A. SLP Short Term Goal 5 (Week 3): Pt will complete basic functional math with mod A.  Skilled Therapeutic Interventions: Pt seen for skilled SLP therapy with focus on cognitive linguistic goals. Pt able to recall goals of SLP therapy session today as established yesterday with mod cueing. Pt with some temporal disorientation intially- was following therapy schedule from previous day and insisting she would not be here Monday since she is discharging on the 28th. Mod A to understand that Monday the 26th comes before Wednesday the 28th. General sustained attention to a 15 min activity was adequate with occasional verbal cueing. Pt required min verbal cueing to utilize strategy of spelling aloud while typing letter by letter. This strategy was also effective with typing, although coordination for typing was somewhat impaired. Min-mod A with self monitoring/correction.   Function:  Eating Eating                 Cognition Comprehension Comprehension assist level: Understands basic 75 - 89% of the time/ requires cueing 10 - 24% of the time  Expression   Expression assist level:  Expresses basic 90% of the time/requires cueing < 10% of the time.  Social Interaction Social Interaction assist level: Interacts appropriately 90% of the time - Needs monitoring or encouragement for participation or interaction.  Problem Solving Problem solving assist level: Solves basic 50 - 74% of the time/requires cueing 25 - 49% of the time  Memory Memory assist level: Recognizes or recalls 50 - 74% of the time/requires cueing 25 - 49% of the time    Pain Pain Assessment Pain Assessment: No/denies pain  Therapy/Group: Individual Therapy  Vinetta Bergamo MA, CCC-SLP 01/11/2016, 3:59 PM

## 2016-01-11 NOTE — Progress Notes (Signed)
[facility]     PROGRESS NOTE    Subjective/Complaints: No issues overnite Asking about Left forearm IV  ROS: Denies CP, COB, N/V/D.  Objective: Vital Signs: Blood pressure 139/61, pulse 81, temperature 98.9 F (37.2 C), temperature source Oral, resp. rate 17, height 5\' 6"  (1.676 m), weight 104.8 kg (231 lb 0.7 oz), SpO2 94 %. No results found.  Recent Labs  01/09/16 0714  WBC 3.9*  HGB 12.0  HCT 35.6*  PLT 277    Recent Labs  01/09/16 0714  NA 135  K 3.8  CL 104  GLUCOSE 91  BUN 13  CREATININE 0.74  CALCIUM 9.4   CBG (last 3)  No results for input(s): GLUCAP in the last 72 hours.  Wt Readings from Last 3 Encounters:  01/11/16 104.8 kg (231 lb 0.7 oz)  12/16/15 108.863 kg (240 lb)  11/05/15 114.306 kg (252 lb)    Physical Exam:  Constitutional: She appears well-developed. No distress. Obese  HENT: Normocephalic and atraumatic.  Eyes: EOM and Conj are normal.  Neck: Normal range of motion. Neck supple. No JVD present. No tracheal deviation present. No thyromegaly present.  Posterior head incision intact  Cardiovascular: regular rhythm and rate.  Respiratory: Effort normal and breath sounds normal. No respiratory distress.  GI: Soft. Bowel sounds are normal. She exhibits no distension.  Neurological: She is alert and oriented.    Strength 4+-5/5 bilateral upper ext and 4+-5/5 HF, 4+-5/5 KE and 5/5 ADFPF.  Skin: She is not diaphoretic.  Surgical incision c/d/i. Red/macular rash on back, resolved.  Inner thighs with moisture related rash Psychiatric: She has a flat affect.      Assessment/Plan: 1. Impaired functional mobility and self-care secondary to IVH/hydrocephalus which require 3+ hours per day of interdisciplinary therapy in a comprehensive inpatient rehab setting. Physiatrist is providing close team supervision and 24 hour management of active medical problems listed below. Physiatrist and rehab team continue to  assess barriers to discharge/monitor patient progress toward functional and medical goals.  Function:  Bathing Bathing position Bathing activity did not occur: Refused Position: Shower  Bathing parts Body parts bathed by patient: Chest, Abdomen, Front perineal area, Left upper leg, Right upper leg, Buttocks, Right arm, Left arm, Right lower leg, Left lower leg Body parts bathed by helper: Back  Bathing assist Assist Level: Supervision or verbal cues      Upper Body Dressing/Undressing Upper body dressing   What is the patient wearing?: Bra, Pull over shirt/dress Bra - Perfomed by patient: Thread/unthread right bra strap, Hook/unhook bra (pull down sports bra), Thread/unthread left bra strap Bra - Perfomed by helper: Hook/unhook bra (pull down sports bra) Pull over shirt/dress - Perfomed by patient: Thread/unthread right sleeve, Thread/unthread left sleeve, Put head through opening, Pull shirt over trunk Pull over shirt/dress - Perfomed by helper: Pull shirt over trunk        Upper body assist Assist Level: Supervision or verbal cues   Set up : To obtain clothing/put away  Lower Body Dressing/Undressing Lower body dressing   What is the patient wearing?: Shoes Underwear - Performed by patient: Thread/unthread right underwear leg, Thread/unthread left underwear leg, Pull underwear up/down Underwear - Performed by helper: Pull underwear up/down Pants- Performed by patient: Thread/unthread right pants leg, Thread/unthread left pants leg, Pull pants up/down Pants- Performed by helper: Thread/unthread right pants leg, Thread/unthread left pants leg Non-skid slipper socks- Performed by patient: Don/doff right sock, Don/doff left sock Non-skid slipper socks- Performed by  helper: Don/doff right sock, Don/doff left sock Socks - Performed by patient: Don/doff right sock, Don/doff left sock   Shoes - Performed by patient: Don/doff right shoe, Don/doff left shoe, Fasten right, Fasten  left Shoes - Performed by helper: Fasten right, Fasten left       TED Hose - Performed by helper: Don/doff right TED hose, Don/doff left TED hose  Lower body assist Assist for lower body dressing: Touching or steadying assistance (Pt > 75%)      Toileting Toileting   Toileting steps completed by patient: Adjust clothing prior to toileting Toileting steps completed by helper: Performs perineal hygiene, Adjust clothing after toileting Toileting Assistive Devices: Grab bar or rail  Toileting assist Assist level: Touching or steadying assistance (Pt.75%)   Transfers Chair/bed transfer   Chair/bed transfer method: Stand pivot, Ambulatory Chair/bed transfer assist level: Supervision or verbal cues Chair/bed transfer assistive device: Armrests, Medical sales representative     Max distance: 200 Assist level: Supervision or verbal cues   Wheelchair   Type: Manual Max wheelchair distance: 150 Assist Level: No help, No cues, assistive device, takes more than reasonable amount of time  Cognition Comprehension Comprehension assist level: Understands basic 75 - 89% of the time/ requires cueing 10 - 24% of the time  Expression Expression assist level: Expresses basic 75 - 89% of the time/requires cueing 10 - 24% of the time. Needs helper to occlude trach/needs to repeat words.  Social Interaction Social Interaction assist level: Interacts appropriately 90% of the time - Needs monitoring or encouragement for participation or interaction.  Problem Solving Problem solving assist level: Solves basic 50 - 74% of the time/requires cueing 25 - 49% of the time  Memory Memory assist level: Recognizes or recalls 50 - 74% of the time/requires cueing 25 - 49% of the time   Medical Problem List and Plan: 1. 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   2. DVT Prophylaxis/Anticoagulation: Subcutaneous Lovenox initiated 12/22/2015. Monitor for any bleeding episodes 3. Pain Management: Neurontin 900 mg every 8 hours.  4. Hypertension. Maxide daily,Nimotop 60 mg every 4 hours 21 days initiated 12/17/2015  Will cont to monitor, overall controlled 5. Neuropsych: This patient is capable of making decisions on her own behalf. 6. Skin/Wound Care: Routine skin checks  Left forearm IVF site looks ok but no longer using will D/C 7. Fluids/Electrolytes/Nutrition:  Discussed intake with patient   Ate 25-75% of her meals yesterday  Added megace to help stimulate appetite. Continue to encourage intake.  Hypokalemia: 3.8 on 6/22 after supplementation. Will continue to monitor  Boost started on 6/20 8. Seizure prophylaxis. Keppra 500 mg twice a day, valproic acid 500 mg daily. EEG negative 9. Hyponatremia.   Continue sodium chloride tablets for now.  Na 135 on 6/22 (stable range) 10. Heat rash--Improved  Continue hypoallergenic sheets 11. UTI  UA positive for bacteria, U culture multi-species  Completed Macrobid  LOS (Days) 15 A FACE TO FACE EVALUATION WAS PERFORMED  [name] 01/11/2016 7:46 AM

## 2016-01-12 ENCOUNTER — Inpatient Hospital Stay (HOSPITAL_COMMUNITY): Payer: BLUE CROSS/BLUE SHIELD | Admitting: Physical Therapy

## 2016-01-12 NOTE — Progress Notes (Signed)
Physical Therapy Session Note  Patient Details  Name: Kelly Wilson MRN: [id number] Date of Birth: 02/09/1956  Today's Date: 01/12/2016 PT Individual Time: 0805-0900 PT Individual Time Calculation (min): 55 min   Short Term Goals: Week 2:  PT Short Term Goal 1 (Week 2): =LTGs  Skilled Therapeutic Interventions/Progress Updates:    Pt received in recliner & agreeable to PT, noting 3/10 headache but reports being premedicated. Gait training x 200 ft room>gym with RW & close supervision; throughout session pt demonstrated mild impulsiveness as noted by her quickly standing up & immediately ambulating with RW. Stair training x 8 steps (6") with R ascending rail & close supervision; pt with occasional small losses of balance but able to correct independently with B rails. Pt required maximum cuing for use of R ascending rail/L descending rail to simulate home environment. Utilized [employer] Limits of Stability with BUE/1 UE/no UE for improved weight shifting/balance. When performing task without BUE support pt required verbal cuing & manual facilitation from PT for weight shifting. Pt stood on compliant surface (blue foam) without BUE support & close supervision<>min A, but requiring mod/max when losing balance posteriorly. Educated pt on anterior weight shifts to help correct posterior losses of balance. Pt able to assemble 13 piece pipe tree shape while standing, with minimal cuing from PT and able to recognize mistakes without assistance. Gait training x 200 ft with RW & close supervision back to room. At end of session pt left in recliner with all needs within reach & husband present to supervise.  Therapy Documentation Precautions:  Precautions Precautions: Fall Restrictions Weight Bearing Restrictions: No  Pain: Pain Assessment Pain Assessment: 0-10 Pain Score: 3  Pain Location: Head Pain Descriptors / Indicators: Aching Pain Intervention(s):  (premedicated)   See Function Navigator  for Current Functional Status.   Therapy/Group: Individual Therapy  Waunita Schooner 01/12/2016, 7:51 AM

## 2016-01-12 NOTE — Progress Notes (Signed)
[facility]     PROGRESS NOTE    Subjective/Complaints: Seen in therapy , denies dizziness  ROS: Denies CP, COB, N/V/D.  Objective: Vital Signs: Blood pressure 139/64, pulse 75, temperature 97.7 F (36.5 C), temperature source Oral, resp. rate 18, height 5\' 6"  (1.676 m), weight 104.8 kg (231 lb 0.7 oz), SpO2 97 %. No results found. No results for input(s): WBC, HGB, HCT, PLT in the last 72 hours. No results for input(s): NA, K, CL, GLUCOSE, BUN, CREATININE, CALCIUM in the last 72 hours.  Invalid input(s): CO CBG (last 3)  No results for input(s): GLUCAP in the last 72 hours.  Wt Readings from Last 3 Encounters:  01/11/16 104.8 kg (231 lb 0.7 oz)  12/16/15 108.863 kg (240 lb)  11/05/15 114.306 kg (252 lb)    Physical Exam:  Constitutional: She appears well-developed. No distress. Obese  HENT: Normocephalic and atraumatic.  Eyes: EOM and Conj are normal.  Neck: Normal range of motion. Neck supple. No JVD present. No tracheal deviation present. No thyromegaly present.  Posterior head incision intact  Cardiovascular: regular rhythm and rate.  Respiratory: Effort normal and breath sounds normal. No respiratory distress.  GI: Soft. Bowel sounds are normal. She exhibits no distension.  Neurological: She is alert and oriented.    Strength 4+-5/5 bilateral upper ext and 4+-5/5 HF, 4+-5/5 KE and 5/5 ADFPF.  Skin: She is not diaphoretic.  Surgical incision c/d/i. Red/macular rash on back, resolved.  Inner thighs with moisture related rash Psychiatric: She has a flat affect.      Assessment/Plan: 1. Impaired functional mobility and self-care secondary to IVH/hydrocephalus which require 3+ hours per day of interdisciplinary therapy in a comprehensive inpatient rehab setting. Physiatrist is providing close team supervision and 24 hour management of active medical problems listed below. Physiatrist and rehab team continue to assess barriers to  discharge/monitor patient progress toward functional and medical goals.  Function:  Bathing Bathing position Bathing activity did not occur: Refused Position: Shower  Bathing parts Body parts bathed by patient: Chest, Abdomen, Front perineal area, Left upper leg, Right upper leg, Buttocks, Right arm, Left arm, Right lower leg, Left lower leg Body parts bathed by helper: Back  Bathing assist Assist Level: Supervision or verbal cues      Upper Body Dressing/Undressing Upper body dressing   What is the patient wearing?: Bra, Pull over shirt/dress Bra - Perfomed by patient: Thread/unthread right bra strap, Hook/unhook bra (pull down sports bra), Thread/unthread left bra strap Bra - Perfomed by helper: Hook/unhook bra (pull down sports bra) Pull over shirt/dress - Perfomed by patient: Thread/unthread right sleeve, Thread/unthread left sleeve, Put head through opening, Pull shirt over trunk Pull over shirt/dress - Perfomed by helper: Pull shirt over trunk        Upper body assist Assist Level: Supervision or verbal cues   Set up : To obtain clothing/put away  Lower Body Dressing/Undressing Lower body dressing   What is the patient wearing?: Shoes Underwear - Performed by patient: Thread/unthread right underwear leg, Thread/unthread left underwear leg, Pull underwear up/down Underwear - Performed by helper: Pull underwear up/down Pants- Performed by patient: Thread/unthread right pants leg, Thread/unthread left pants leg, Pull pants up/down Pants- Performed by helper: Thread/unthread right pants leg, Thread/unthread left pants leg Non-skid slipper socks- Performed by patient: Don/doff right sock, Don/doff left sock Non-skid slipper socks- Performed by helper: Don/doff right sock, Don/doff left sock Socks - Performed by patient: Don/doff right sock, Don/doff left sock  Shoes - Performed by patient: Don/doff right shoe, Don/doff left shoe, Fasten right, Fasten left Shoes - Performed by  helper: Fasten right, Fasten left       TED Hose - Performed by helper: Don/doff right TED hose, Don/doff left TED hose  Lower body assist Assist for lower body dressing: Touching or steadying assistance (Pt > 75%)      Toileting Toileting   Toileting steps completed by patient: Adjust clothing prior to toileting Toileting steps completed by helper: Performs perineal hygiene, Adjust clothing after toileting Toileting Assistive Devices: Grab bar or rail  Toileting assist Assist level: Touching or steadying assistance (Pt.75%)   Transfers Chair/bed transfer   Chair/bed transfer method: Stand pivot, Ambulatory Chair/bed transfer assist level: Supervision or verbal cues Chair/bed transfer assistive device: Armrests, Medical sales representative     Max distance: 200 Assist level: Supervision or verbal cues   Wheelchair   Type: Manual Max wheelchair distance: 150 Assist Level: No help, No cues, assistive device, takes more than reasonable amount of time  Cognition Comprehension Comprehension assist level: Understands basic 75 - 89% of the time/ requires cueing 10 - 24% of the time  Expression Expression assist level: Expresses basic 90% of the time/requires cueing < 10% of the time.  Social Interaction Social Interaction assist level: Interacts appropriately 90% of the time - Needs monitoring or encouragement for participation or interaction.  Problem Solving Problem solving assist level: Solves basic 50 - 74% of the time/requires cueing 25 - 49% of the time  Memory Memory assist level: Recognizes or recalls 50 - 74% of the time/requires cueing 25 - 49% of the time   Medical Problem List and Plan: 1. 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   2. DVT Prophylaxis/Anticoagulation: Subcutaneous Lovenox initiated 12/22/2015. Monitor for any bleeding episodes 3. Pain Management: Neurontin 900 mg every 8 hours.  4. Hypertension. Maxide daily,Nimotop 60 mg every 4 hours 21 days initiated 12/17/2015  Will cont to monitor, overall controlled 5. Neuropsych: This patient is capable of making decisions on her own behalf. 6. Skin/Wound Care: Routine skin checks  Left forearm IVF site looks ok but no longer using will D/C 7. Fluids/Electrolytes/Nutrition:  Discussed intake with patient   Ate 25-75% of her meals yesterday  Added megace to help stimulate appetite. Continue to encourage intake.  Hypokalemia: 3.8 on 6/22 after supplementation. Will continue to monitor  Boost started on 6/20 8. Seizure prophylaxis. Keppra 500 mg twice a day, valproic acid 500 mg daily. EEG negative 9. Hyponatremia.   Continue sodium chloride tablets for now.  Na 135 on 6/22 (stable range) 10. Heat rash--Improved  Continue hypoallergenic sheets 11. UTI  UA positive for bacteria, U culture multi-species  Completed Macrobid  LOS (Days) 16 A FACE TO FACE EVALUATION WAS PERFORMED  , E 01/12/2016 8:45 AM

## 2016-01-13 ENCOUNTER — Inpatient Hospital Stay (HOSPITAL_COMMUNITY): Payer: BLUE CROSS/BLUE SHIELD | Admitting: Speech Pathology

## 2016-01-13 ENCOUNTER — Inpatient Hospital Stay (HOSPITAL_COMMUNITY): Payer: BLUE CROSS/BLUE SHIELD | Admitting: Occupational Therapy

## 2016-01-13 ENCOUNTER — Telehealth: Payer: Self-pay | Admitting: Neurology

## 2016-01-13 ENCOUNTER — Inpatient Hospital Stay (HOSPITAL_COMMUNITY): Payer: BLUE CROSS/BLUE SHIELD | Admitting: Physical Therapy

## 2016-01-13 NOTE — Telephone Encounter (Signed)
Rn call  Kelly Wilson case manager at [phone] about pt having aneurysm. Rn stated patient would need to follow up with the surgeon who did her craniotomy per Kelly Wilson. Rn stated pt was last seen by Kelly Wilson(NP for muscle cramps and TIA follow up. Pt was last seen by Kelly Wilson in July 2016. Kelly Wilson stated her PCP is not Dr Loanne Drilling. Rn stated the patient will have to give the name of the PCP. Rn stated patients next appt at [place] is 10//2016 with Kelly Wilson. Rn stated per epic appts pt has appts for therapy. Kelly Wilson verbalized understanding.

## 2016-01-13 NOTE — Progress Notes (Signed)
Speech Language Pathology Daily Session Note  Patient Details  Name: Kelly Wilson MRN: [id number] Date of Birth: 03/05/1956  Today's Date: 01/13/2016 SLP Individual Time: 0900-1000 SLP Individual Time Calculation (min): 60 min  Short Term Goals: Week 3: SLP Short Term Goal 1 (Week 3): Patient will utilize external memory aids to recall new, daily information with Mod A question and verbal cues.  SLP Short Term Goal 2 (Week 3): Patient will self-monitor and correct errors during functional tasks/verbal conversation with min A question cues.  SLP Short Term Goal 3 (Week 3): Patient will demonstrate functional problem solving for basic and familiar tasks with min A verbal cues.  SLP Short Term Goal 4 (Week 3): Pt will demonstrate writing at the sentence level with use of compensatory strategy for spelling with min A. SLP Short Term Goal 5 (Week 3): Pt will complete basic functional math with mod A.  Skilled Therapeutic Interventions: Pt seen for skilled SLP therapy with focus on cognitive linguistic goals.  Pt required min verbal cueing to initiate strategy of spelling aloud while typing letter by letter. This strategy was effective with typing, however pt required Min A with self monitoring. Once error was identified, pt was generally able to correct it. Min- mod A with mod level math reasoning tasks. Discussed readiness to return to work- pt with poor awareness of impact of deficits on functional performance. Pt was agreeable that she wasn't okay to return to work, but initially seemed surprised by the recommendation.    Function:  Eating Eating                 Cognition Comprehension Comprehension assist level: Understands basic 90% of the time/cues < 10% of the time  Expression   Expression assist level: Expresses complex 90% of the time/cues < 10% of the time  Social Interaction Social Interaction assist level: Interacts appropriately 90% of the time - Needs monitoring or  encouragement for participation or interaction.  Problem Solving Problem solving assist level: Solves basic 75 - 89% of the time/requires cueing 10 - 24% of the time  Memory Memory assist level: Recognizes or recalls 50 - 74% of the time/requires cueing 25 - 49% of the time    Pain Pain Assessment Pain Assessment: No/denies pain  Therapy/Group: Individual Therapy  Vinetta Bergamo MA, CCC-SLP 01/13/2016, 12:15 PM

## 2016-01-13 NOTE — Progress Notes (Signed)
Occupational Therapy Session Note  Patient Details  Name: Kelly Wilson MRN: [id number] Date of Birth: Nov 19, 1955  Today's Date: 01/13/2016 OT Individual Time: 0800-0900 OT Individual Time Calculation (min): 60 min    Short Term Goals: Week 2:  OT Short Term Goal 1 (Week 2): STGs=LTGs seccondary to upcoming discharge  Skilled Therapeutic Interventions/Progress Updates:    Upon entering the room, pt seated in recliner chair with husband present in room. OT inquired about family education and he states he had to return to work. Pt engaged in bathing from shower level today with supervision and use of RW. Pt utilized RW to ambulated to [employer] in order to obtain clothing items. Pt ambulating to bathroom with items draped on RW and obtaining towels and cloths as needed. Pt performed stand pivot transfer into shower and on TTB with steady assist for safety. Pt engaged in bathing from TTB with overall supervision this session. Pt needing min verbal cues for attention this session. Pt dressed from toilet in bathroom with overall supervision for safety. Pt standing at sink for grooming tasks with overall supervision for 7 minutes. Pt returning to sit in recliner chair at end of session with call bell and all needed items within reach.   Therapy Documentation Precautions:  Precautions Precautions: Fall Restrictions Weight Bearing Restrictions: No Pain: Pain Assessment Pain Assessment: No/denies pain  See Function Navigator for Current Functional Status.   Therapy/Group: Individual Therapy  Phineas Semen 01/13/2016, 9:56 AM

## 2016-01-13 NOTE — Progress Notes (Signed)
Physical Therapy Session Note  Patient Details  Name: Kelly Wilson MRN: [id number] Date of Birth: 1956/07/02  Today's Date: 01/13/2016 PT Group Time: 1105-1205 PT Group Time Calculation (min): 60 min  Short Term Goals: Week 2:  PT Short Term Goal 1 (Week 2): =LTGs  Skilled Therapeutic Interventions/Progress Updates:    Pt received in therapy gym agreeable to group session.  Session focus on balance and gait.  PT instructed pt in dynamic standing balance activity reaching forward outside BOS to retrieve cones 2x5, and x6 reaching forward for cones and placing behind in w/c.  Gait training x100' with HHA, x49' with RW and supervision and min verbal cues for safe self selected pace.  6 minute walk test with pt ambulating 796' with RW and supervision with occasional mild LOB which pt self corrects.  Pt amb back to room at end of session and positioned in recliner with call bell in reach and needs met.   Therapy Documentation Precautions:  Precautions Precautions: Fall Restrictions Weight Bearing Restrictions: No   See Function Navigator for Current Functional Status.   Therapy/Group: Group Therapy   E Penven-Crew 01/13/2016, 12:24 PM

## 2016-01-13 NOTE — Progress Notes (Signed)
[facility] PHYSICAL MEDICINE & REHABILITATION     PROGRESS NOTE    Subjective/Complaints: Patient sitting up in her recliner with breakfast and fiber. She states that her appetite has increased, but does admit that she hasn't eaten much for breakfast. She states she does not like the taste of the food.  ROS: Denies CP, COB, N/V/D.  Objective: Vital Signs: Blood pressure 138/72, pulse 76, temperature 98.5 F (36.9 C), temperature source Oral, resp. rate 18, height 5\' 6"  (1.676 m), weight 104.8 kg (231 lb 0.7 oz), SpO2 96 %. No results found. No results for input(s): WBC, HGB, HCT, PLT in the last 72 hours. No results for input(s): NA, K, CL, GLUCOSE, BUN, CREATININE, CALCIUM in the last 72 hours.  Invalid input(s): CO CBG (last 3)  No results for input(s): GLUCAP in the last 72 hours.  Wt Readings from Last 3 Encounters:  01/11/16 104.8 kg (231 lb 0.7 oz)  12/16/15 108.863 kg (240 lb)  11/05/15 114.306 kg (252 lb)    Physical Exam:  Constitutional: She appears well-developed. No distress. Obese  HENT: Normocephalic and atraumatic.  Eyes: EOM and Conj are normal.  Neck: Normal range of motion. Neck supple. No JVD present. No tracheal deviation present. No thyromegaly present.  Posterior head incision intact  Cardiovascular: regular rhythm and rate.  Respiratory: Effort normal and breath sounds normal. No respiratory distress.  GI: Soft. Bowel sounds are normal. She exhibits no distension.  Neurological: She is alert and oriented.    Strength 4+-5/5 bilateral upper ext and 4+-5/5 HF, 4+-5/5 KE and 5/5 ADFPF.  Skin: She is not diaphoretic.  Surgical incision c/d/i. Red/macular rash on back, resolved.  Inner thighs with moisture related rash Psychiatric: She has a flat affect.      Assessment/Plan: 1. Impaired functional mobility and self-care secondary to IVH/hydrocephalus which require 3+ hours per day of interdisciplinary therapy in a comprehensive inpatient rehab  setting. Physiatrist is providing close team supervision and 24 hour management of active medical problems listed below. Physiatrist and rehab team continue to assess barriers to discharge/monitor patient progress toward functional and medical goals.  Function:  Bathing Bathing position Bathing activity did not occur: Refused Position: Shower  Bathing parts Body parts bathed by patient: Chest, Abdomen, Front perineal area, Left upper leg, Right upper leg, Buttocks, Right arm, Left arm, Right lower leg, Left lower leg Body parts bathed by helper: Back  Bathing assist Assist Level: Supervision or verbal cues      Upper Body Dressing/Undressing Upper body dressing   What is the patient wearing?: Bra, Pull over shirt/dress Bra - Perfomed by patient: Thread/unthread right bra strap, Hook/unhook bra (pull down sports bra), Thread/unthread left bra strap Bra - Perfomed by helper: Hook/unhook bra (pull down sports bra) Pull over shirt/dress - Perfomed by patient: Thread/unthread right sleeve, Thread/unthread left sleeve, Put head through opening, Pull shirt over trunk Pull over shirt/dress - Perfomed by helper: Pull shirt over trunk        Upper body assist Assist Level: Supervision or verbal cues   Set up : To obtain clothing/put away  Lower Body Dressing/Undressing Lower body dressing   What is the patient wearing?: Shoes Underwear - Performed by patient: Thread/unthread right underwear leg, Thread/unthread left underwear leg, Pull underwear up/down Underwear - Performed by helper: Pull underwear up/down Pants- Performed by patient: Thread/unthread right pants leg, Thread/unthread left pants leg, Pull pants up/down Pants- Performed by helper: Thread/unthread right pants leg, Thread/unthread left pants leg Non-skid slipper socks-  Performed by patient: Don/doff right sock, Don/doff left sock Non-skid slipper socks- Performed by helper: Don/doff right sock, Don/doff left sock Socks -  Performed by patient: Don/doff right sock, Don/doff left sock   Shoes - Performed by patient: Don/doff right shoe, Don/doff left shoe, Fasten right, Fasten left Shoes - Performed by helper: Fasten right, Fasten left       TED Hose - Performed by helper: Don/doff right TED hose, Don/doff left TED hose  Lower body assist Assist for lower body dressing: Touching or steadying assistance (Pt > 75%)      Toileting Toileting   Toileting steps completed by patient: Adjust clothing prior to toileting, Performs perineal hygiene, Adjust clothing after toileting Toileting steps completed by helper: Adjust clothing after toileting, Performs perineal hygiene Toileting Assistive Devices: Grab bar or rail  Toileting assist Assist level: Touching or steadying assistance (Pt.75%)   Transfers Chair/bed transfer   Chair/bed transfer method: Stand pivot, Ambulatory Chair/bed transfer assist level: Supervision or verbal cues Chair/bed transfer assistive device: Armrests, Medical sales representative     Max distance: 200 ft Assist level: Supervision or verbal cues   Wheelchair   Type: Manual Max wheelchair distance: 150 Assist Level: No help, No cues, assistive device, takes more than reasonable amount of time  Cognition Comprehension Comprehension assist level: Understands basic 75 - 89% of the time/ requires cueing 10 - 24% of the time  Expression Expression assist level: Expresses complex 90% of the time/cues < 10% of the time  Social Interaction Social Interaction assist level: Interacts appropriately 90% of the time - Needs monitoring or encouragement for participation or interaction.  Problem Solving Problem solving assist level: Solves basic 50 - 74% of the time/requires cueing 25 - 49% of the time  Memory Memory assist level: Recognizes or recalls 50 - 74% of the time/requires cueing 25 - 49% of the time   Medical Problem List and Plan: 1. 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   2. DVT Prophylaxis/Anticoagulation: Subcutaneous Lovenox initiated 12/22/2015. Monitor for any bleeding episodes 3. Pain Management: Neurontin 900 mg every 8 hours.  4. Hypertension. Maxide daily,Nimotop 60 mg every 4 hours 21 days initiated 12/17/2015  Will cont to monitor, overall controlled 5. Neuropsych: This patient is capable of making decisions on her own behalf. 6. Skin/Wound Care: Routine skin checks  Left forearm IVF site looks ok but no longer using will D/C 7. Fluids/Electrolytes/Nutrition:  Discussed intake with patient   Ate 40-75% of her meals yesterday  Added megace to help stimulate appetite. Continue to encourage intake.  Hypokalemia: 3.8 on 6/22 after supplementation. Will continue to monitor  Boost started on 6/20 8. Seizure prophylaxis. Keppra 500 mg twice a day, valproic acid 500 mg daily. EEG negative 9. Hyponatremia.   Continue sodium chloride tablets for now.  Na 135 on 6/22 (stable range) 10. Heat rash--Improved  Continue hypoallergenic sheets 11. UTI  UA positive for bacteria, U culture multi-species  Completed Macrobid  LOS (Days) 17 A FACE TO FACE EVALUATION WAS PERFORMED   Lorie Phenix 01/13/2016 8:31 AM

## 2016-01-13 NOTE — Telephone Encounter (Addendum)
Tiffany/[employer] (case nurse) is calling to let us know that this patient suffered an aneurysm and has been release from the nursing facility. Nurse is calling to let you know she is available to help with this case if needed.

## 2016-01-14 ENCOUNTER — Inpatient Hospital Stay (HOSPITAL_COMMUNITY): Payer: BLUE CROSS/BLUE SHIELD | Admitting: Physical Therapy

## 2016-01-14 ENCOUNTER — Ambulatory Visit (HOSPITAL_COMMUNITY): Payer: BLUE CROSS/BLUE SHIELD | Admitting: Physical Therapy

## 2016-01-14 ENCOUNTER — Inpatient Hospital Stay (HOSPITAL_COMMUNITY): Payer: BLUE CROSS/BLUE SHIELD | Admitting: Occupational Therapy

## 2016-01-14 ENCOUNTER — Inpatient Hospital Stay (HOSPITAL_COMMUNITY): Payer: BLUE CROSS/BLUE SHIELD | Admitting: Speech Pathology

## 2016-01-14 DIAGNOSIS — D72819 Decreased white blood cell count, unspecified: Secondary | ICD-10-CM

## 2016-01-14 LAB — CBC WITH DIFFERENTIAL/PLATELET
Basophils Absolute: 0 10*3/uL (ref 0.0–0.1)
Basophils Relative: 0 %
EOS ABS: 0.1 10*3/uL (ref 0.0–0.7)
EOS PCT: 2 %
HCT: 34.6 % — ABNORMAL LOW (ref 36.0–46.0)
Hemoglobin: 11.5 g/dL — ABNORMAL LOW (ref 12.0–15.0)
LYMPHS ABS: 1.2 10*3/uL (ref 0.7–4.0)
LYMPHS PCT: 35 %
MCH: 27.4 pg (ref 26.0–34.0)
MCHC: 33.2 g/dL (ref 30.0–36.0)
MCV: 82.4 fL (ref 78.0–100.0)
MONO ABS: 0.5 10*3/uL (ref 0.1–1.0)
Monocytes Relative: 14 %
Neutro Abs: 1.7 10*3/uL (ref 1.7–7.7)
Neutrophils Relative %: 49 %
PLATELETS: 302 10*3/uL (ref 150–400)
RBC: 4.2 MIL/uL (ref 3.87–5.11)
RDW: 14.3 % (ref 11.5–15.5)
WBC: 3.4 10*3/uL — ABNORMAL LOW (ref 4.0–10.5)

## 2016-01-14 LAB — BASIC METABOLIC PANEL
ANION GAP: 8 (ref 5–15)
BUN: 6 mg/dL (ref 6–20)
CO2: 22 mmol/L (ref 22–32)
CREATININE: 0.72 mg/dL (ref 0.44–1.00)
Calcium: 8.9 mg/dL (ref 8.9–10.3)
Chloride: 107 mmol/L (ref 101–111)
GLUCOSE: 95 mg/dL (ref 65–99)
POTASSIUM: 3.2 mmol/L — AB (ref 3.5–5.1)
SODIUM: 137 mmol/L (ref 135–145)

## 2016-01-14 MED ORDER — POTASSIUM CHLORIDE CRYS ER 20 MEQ PO TBCR
20.0000 meq | EXTENDED_RELEASE_TABLET | Freq: Every day | ORAL | Status: DC
  Administered 2016-01-14 – 2016-01-15 (×2): 20 meq via ORAL
  Filled 2016-01-14 (×2): qty 1

## 2016-01-14 NOTE — Progress Notes (Addendum)
[facility] PHYSICAL MEDICINE & REHABILITATION     PROGRESS NOTE    Subjective/Complaints: Patient sitting up in her recliner.  She recalls our conversation yesterday and is looking forward to going home tomorrow.   ROS: Denies CP, COB, N/V/D.  Objective: Vital Signs: Blood pressure 142/67, pulse 71, temperature 99 F (37.2 C), temperature source Oral, resp. rate 16, height 5\' 6"  (1.676 m), weight 104.8 kg (231 lb 0.7 oz), SpO2 97 %. No results found.  Recent Labs  01/14/16 0612  WBC 3.4*  HGB 11.5*  HCT 34.6*  PLT 302    Recent Labs  01/14/16 0612  NA 137  K 3.2*  CL 107  GLUCOSE 95  BUN 6  CREATININE 0.72  CALCIUM 8.9   CBG (last 3)  No results for input(s): GLUCAP in the last 72 hours.  Wt Readings from Last 3 Encounters:  01/11/16 104.8 kg (231 lb 0.7 oz)  12/16/15 108.863 kg (240 lb)  11/05/15 114.306 kg (252 lb)    Physical Exam:  Constitutional: She appears well-developed. No distress. Obese  HENT: Normocephalic and atraumatic.  Eyes: EOM and Conj are normal.  Neck: Normal range of motion. Neck supple. No JVD present. No tracheal deviation present. No thyromegaly present.  Posterior head incision intact  Cardiovascular: regular rhythm and rate.  Respiratory: Effort normal and breath sounds normal. No respiratory distress.  GI: Soft. Bowel sounds are normal. She exhibits no distension.  Neurological: She is alert and oriented.    Strength 4+-5/5 bilateral upper ext and 4+-5/5 HF, 4+-5/5 KE and 5/5 ADFPF.  Skin: She is not diaphoretic. Warm and dry. Surgical incision c/d/i. Red/macular rash on back, resolved.  Psychiatric: She has a flat affect.      Assessment/Plan: 1. Impaired functional mobility and self-care secondary to IVH/hydrocephalus which require 3+ hours per day of interdisciplinary therapy in a comprehensive inpatient rehab setting. Physiatrist is providing close team supervision and 24 hour management of active medical problems listed  below. Physiatrist and rehab team continue to assess barriers to discharge/monitor patient progress toward functional and medical goals.  Function:  Bathing Bathing position Bathing activity did not occur: Refused Position: Shower  Bathing parts Body parts bathed by patient: Chest, Abdomen, Front perineal area, Left upper leg, Right upper leg, Buttocks, Right arm, Left arm, Right lower leg, Left lower leg Body parts bathed by helper: Back  Bathing assist Assist Level: Supervision or verbal cues      Upper Body Dressing/Undressing Upper body dressing   What is the patient wearing?: Bra, Pull over shirt/dress Bra - Perfomed by patient: Thread/unthread right bra strap, Hook/unhook bra (pull down sports bra), Thread/unthread left bra strap Bra - Perfomed by helper: Hook/unhook bra (pull down sports bra) Pull over shirt/dress - Perfomed by patient: Thread/unthread right sleeve, Thread/unthread left sleeve, Put head through opening, Pull shirt over trunk Pull over shirt/dress - Perfomed by helper: Pull shirt over trunk        Upper body assist Assist Level: Supervision or verbal cues   Set up : To obtain clothing/put away  Lower Body Dressing/Undressing Lower body dressing   What is the patient wearing?: Underwear, Pants, Socks, Shoes Underwear - Performed by patient: Thread/unthread right underwear leg, Thread/unthread left underwear leg, Pull underwear up/down Underwear - Performed by helper: Pull underwear up/down Pants- Performed by patient: Thread/unthread right pants leg, Thread/unthread left pants leg, Pull pants up/down Pants- Performed by helper: Thread/unthread right pants leg, Thread/unthread left pants leg Non-skid slipper socks- Performed by  patient: Don/doff right sock, Don/doff left sock Non-skid slipper socks- Performed by helper: Don/doff right sock, Don/doff left sock Socks - Performed by patient: Don/doff right sock, Don/doff left sock   Shoes - Performed by patient:  Don/doff right shoe, Don/doff left shoe, Fasten right, Fasten left Shoes - Performed by helper: Fasten right, Fasten left       TED Hose - Performed by helper: Don/doff right TED hose, Don/doff left TED hose  Lower body assist Assist for lower body dressing: Supervision or verbal cues      Toileting Toileting   Toileting steps completed by patient: Adjust clothing prior to toileting, Performs perineal hygiene, Adjust clothing after toileting Toileting steps completed by helper: Adjust clothing after toileting, Performs perineal hygiene Toileting Assistive Devices: Grab bar or rail  Toileting assist Assist level: Supervision or verbal cues   Transfers Chair/bed transfer   Chair/bed transfer method: Ambulatory Chair/bed transfer assist level: Supervision or verbal cues Chair/bed transfer assistive device: Armrests, Medical sales representative     Max distance: 796 Assist level: Supervision or verbal cues   Wheelchair   Type: Manual Max wheelchair distance: 150 Assist Level: No help, No cues, assistive device, takes more than reasonable amount of time  Cognition Comprehension Comprehension assist level: Understands basic 90% of the time/cues < 10% of the time  Expression Expression assist level: Expresses complex 90% of the time/cues < 10% of the time  Social Interaction Social Interaction assist level: Interacts appropriately 90% of the time - Needs monitoring or encouragement for participation or interaction.  Problem Solving Problem solving assist level: Solves basic 75 - 89% of the time/requires cueing 10 - 24% of the time  Memory Memory assist level: Recognizes or recalls 50 - 74% of the time/requires cueing 25 - 49% of the time   Medical Problem List and Plan: 1. 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   2. DVT Prophylaxis/Anticoagulation: Subcutaneous Lovenox initiated 12/22/2015. Monitor for any bleeding episodes 3. Pain Management: Neurontin 900 mg every 8 hours.  4. Hypertension. Maxide daily,Nimotop 60 mg every 4 hours 21 days initiated 12/17/2015  Will cont to monitor, overall controlled 5. Neuropsych: This patient is capable of making decisions on her own behalf. 6. Skin/Wound Care: Routine skin checks 7. Fluids/Electrolytes/Nutrition:  Discussed intake with patient   Ate 50-80% of her meals yesterday, improving  Added megace to help stimulate appetite. Continue to encourage intake.  Hypokalemia: 3.2 on 6/27, will start supplement  Boost started on 6/20 8. Seizure prophylaxis. Keppra 500 mg twice a day, valproic acid 500 mg daily. EEG negative 9. Hyponatremia.   Continue sodium chloride tablets for now.  Na 137 on 6/27 (stable range) 10. Heat rash--Improved  Continue hypoallergenic sheets 11. UTI  UA positive for bacteria, U culture multi-species  Completed Macrobid 12. Leukopenia  Appears to be trending down?  Will need close follow up as outpt  LOS (Days) 18 A FACE TO FACE EVALUATION WAS PERFORMED   Lorie Phenix 01/14/2016 9:37 AM

## 2016-01-14 NOTE — Progress Notes (Signed)
Physical Therapy Discharge Summary  Patient Details  Name: Kelly Wilson MRN: [id number] Date of Birth: 27-Apr-1956  Today's Date: 01/14/2016 PT Individual Time: 0900-0955 PT Individual Time Calculation (min): 55 min    Patient has met 13 of 13 long term goals due to improved activity tolerance, improved balance, improved postural control, increased strength, decreased pain, ability to compensate for deficits, functional use of  right lower extremity and left lower extremity, improved attention, improved awareness and improved coordination.  Patient to discharge at an ambulatory level Supervision.   Patient's care partner is independent to provide the necessary cognitive assistance at discharge.  Reasons goals not met: All goals met  Recommendation:  Patient will benefit from ongoing skilled PT services in outpatient setting to continue to advance safe functional mobility, address ongoing impairments in cognition, strength, postural control and balance, impaired gait, and minimize fall risk.  Equipment: No equipment provided; pt owns all necessary equipment  Reasons for discharge: treatment goals met and discharge from hospital  Patient/family agrees with progress made and goals achieved: Yes  PT Discharge Pt received in recliner with husband and daughter present; daughter to stay for family education.  Pt reporting headache but premedicated and willing to participate.  Performed re-assessment of LE strength, sensation and coordination.  Pt and daughter engaged in family education session with focus on safety during gait with RW in controlled and home environments, tall SUV transfers with running board with therapist recommending pt sit/lean against seat first and then scoot back on seat with LE on running board vs. Stepping onto running board due to impaired balance and intermittent decreased attention to LLE placement.  Pt performed with supervision and verbal cues.  Performed ramp  negotiation, gait on compliant surfaces and one step negotiation training with RW to simulate home environment with supervision and verbal cues for safety/sequencing.  Performed stair negotiation as indicated below.  Daughter asking if pt would be safe enough to ambulate in narrow spaces in bathroom and kitchen without RW but with UE support on counters to make a sandwich.  In apartment pt engaged in gait training/assessment without RW in kitchen with pt obtaining items from cabinets and cued to carry plate of items from counter <> table without AD; pt with 2 lateral LOB when turning and with head turns with mod A to correct when ambulating counter <> table but pt able to maintain balance with one UE support on counter top.  Recommending that pt perform tasks supported at counter or seated on bar stool for improved safety and either use a bag on RW to carry items to table/living room or have family member carry items.  Also performed transfers from low couch and floor <> furniture transfers with supervision and discussion about indications for calling EMS.  Pt able to retrieve items from floor with supervision but verbal cues for gaze stabilization when returning to stand to avoid LOB.  Pt returned to room ambulating without RW but with supervision-min A but with guarded head and neck movements due to LOB.  Recommending continued use of RW at home.  Pt left in recliner with daughter and all items within reach; no further questions or concerns.    Precautions/Restrictions Precautions Precautions: Fall Restrictions Weight Bearing Restrictions: No Pain Pain Assessment Pain Assessment: No/denies pain Pain Score: 5  Pain Type: Acute pain Pain Location: Head Pain Descriptors / Indicators: Headache Pain Onset: Gradual Pain Intervention(s): Other (Comment) (premedicated) Cognition Overall Cognitive Status: Impaired/Different from baseline Arousal/Alertness: Awake/alert Orientation Level:  Oriented to  person;Oriented to place;Oriented to time Attention: Selective Sustained Attention: Impaired Sustained Attention Impairment: Verbal complex;Functional complex Selective Attention: Impaired Selective Attention Impairment: Verbal complex;Functional complex Memory: Impaired Memory Impairment: Decreased recall of new information Decreased Long Term Memory: Verbal complex Decreased Short Term Memory: Verbal complex;Functional complex Awareness Impairment: Anticipatory impairment Problem Solving: Impaired Problem Solving Impairment: Verbal complex;Functional complex Executive Function: Organizing;Reasoning Reasoning: Impaired Reasoning Impairment: Verbal complex;Functional complex Organizing: Impaired Organizing Impairment: Verbal complex;Functional complex Behaviors: Impulsive;Perseveration Safety/Judgment: Impaired Sensation Sensation Light Touch: Appears Intact Stereognosis: Not tested Hot/Cold: Not tested Proprioception: Appears Intact Coordination Gross Motor Movements are Fluid and Coordinated: Yes Motor  Motor Motor: Abnormal postural alignment and control  Mobility Bed Mobility Supine to Sit: 5: Supervision Sit to Supine: 5: Supervision Transfers Stand Pivot Transfers: 5: Supervision Locomotion  Ambulation Ambulation/Gait Assistance: 5: Supervision Ambulation Distance (Feet): 150 Feet Assistive device: Rolling walker;None Gait Gait Pattern: Impaired Gait Pattern: Wide base of support;Decreased trunk rotation;Step-through pattern Stairs / Additional Locomotion Stairs Assistance: 5: Supervision Stairs Assistance Details (indicate cue type and reason): Performed stair negotiation up/down one step with RW to simulate home entry/exit one step to porch + 1 into house with supervision and verbal cues for sequence with daughter return demonstrating; also performed up/down 12 steps with 2 rails with pt self selecting alternating pattern with supervision and verbal cues to  attend to foot placement. Stair Management Technique: With walker;Two rails;Alternating pattern;Step to pattern Number of Stairs: 12 Height of Stairs: 6.5 Ramp: 5: Supervision Curb: 5: Supervision Wheelchair Mobility Wheelchair Mobility: No  Trunk/Postural Assessment  Cervical Assessment Cervical Assessment: Within Functional Limits Thoracic Assessment Thoracic Assessment: Within Functional Limits Lumbar Assessment Lumbar Assessment: Within Functional Limits Postural Control Postural Control:  (lateral LOB with head movements and sudden turns)  Balance Static Sitting Balance Static Sitting - Level of Assistance: 7: Independent Dynamic Sitting Balance Dynamic Sitting - Level of Assistance: 6: Modified independent (Device/Increase time) Static Standing Balance Static Standing - Level of Assistance: 5: Stand by assistance Dynamic Standing Balance Dynamic Standing - Level of Assistance: 5: Stand by assistance Extremity Assessment  RLE Assessment RLE Assessment: Exceptions to WFL (4/5 overall) LLE Assessment LLE Assessment: Within Functional Limits (4+/5 overall)   See Function Navigator for Current Functional Status.  Raylene Everts Faucette 01/14/2016, 12:33 PM

## 2016-01-14 NOTE — Discharge Instructions (Signed)
Inpatient Rehab Discharge Instructions  CRISTAL DELAROCA Discharge date and time: No discharge date for patient encounter.   Activities/Precautions/ Functional Status: Activity: activity as tolerated Diet: regular diet Wound Care: none needed Functional status:  ___ No restrictions     ___ Walk up steps independently ___ 24/7 supervision/assistance   ___ Walk up steps with assistance ___ Intermittent supervision/assistance  ___ Bathe/dress independently ___ Walk with walker     _x__ Bathe/dress with assistance ___ Walk Independently    ___ Shower independently ___ Walk with assistance    ___ Shower with assistance ___ No alcohol     ___ Return to work/school ________   COMMUNITY REFERRALS UPON DISCHARGE:    Outpatient: PT     OT    ST                  Agency:  [facility]     Phone: [phone]               Appointment Date/Time:  6/29 @ 8:00 am (arrive @ 7:40) for Speech Therapy                                                                 7/5   @ 10:15 for Physical Therapy                                                                 7/10 @ 8:00 for Occupational Thearapy   GENERAL COMMUNITY RESOURCES FOR PATIENT/FAMILY:  Support Groups: Brain Injury Support Group        Special Instructions:  No driving  My questions have been answered and I understand these instructions. I will adhere to these goals and the provided educational materials after my discharge from the hospital.  Patient/Caregiver Signature _______________________________ Date __________  Clinician Signature _______________________________________ Date __________  Please bring this form and your medication list with you to all your follow-up doctor's appointments.

## 2016-01-14 NOTE — Discharge Summary (Signed)
Discharge summary job # [id number]

## 2016-01-14 NOTE — Progress Notes (Signed)
Speech Language Pathology Discharge Summary  Patient Details  Name: Kelly Wilson MRN: [id number] Date of Birth: 1956-06-17  Today's Date: 01/14/2016 SLP Individual Time: 1100-1200 SLP Individual Time Calculation (min): 60 min   Skilled Therapeutic Interventions:  Skilled treatment session focused on cognitive goals and discharge education for pt and family. SLP facilitated session by providing education on current deficits, including decreased anticipatory safety awareness, decreased short term memory and decreased complex problem solving. Pt required Mod A verbal cues to demonstrate anticipatory awareness within future home situations. She required Mod A verbal cues for complex problem solving task (calendar/daily planning task). Education provided on impact these deficits with may have on pt's ability/readiness to immediately return to work and and reviewed recommendation for 24/7 supervision. Daughter states that pt will have "someone with her all the time." Memory strategy handout given and all questions answered to pt and daughter's satisfaction.     Patient has met 3 of 3 long term goals.  Patient to discharge at [facility] level.  Reasons goals not met: N/A  Clinical Impression/Discharge Summary: Pt presents with mild cognitive deficits within basic familiar tasks within a controlled environment. Pt requires mod A verbal assistance with complex verbal and functional tasks and therefore requires 24/7 supervision upon discharge. Pt continues ot need support in the areas of safety awareness (speciffically anticipatory awareness), memory and complex problem solving.  Daughter present this day and education provided to pt and daughter on cognitive deficits and impact on pt's safety within the home environment. Pt with decreased insight into deficits and impact on home environement. Daughter provided that pt will have "someone with her all the time."  Care Partner:  Caregiver Able to Provide  Assistance: Yes  Type of Caregiver Assistance: Cognitive  Recommendation:  Outpatient SLP;Home Health SLP;24 hour supervision/assistance  Rationale for SLP Follow Up: Maximize functional communication;Maximize cognitive function and independence;Reduce caregiver burden   Equipment: N/A   Reasons for discharge: Treatment goals met   Patient/Family Agrees with Progress Made and Goals Achieved: Yes   Function:  Cognition Comprehension Comprehension assist level: Understands basic 90% of the time/cues < 10% of the time  Expression   Expression assist level: Expresses complex 90% of the time/cues < 10% of the time  Social Interaction Social Interaction assist level: Interacts appropriately 90% of the time - Needs monitoring or encouragement for participation or interaction.  Problem Solving Problem solving assist level: Solves basic 75 - 89% of the time/requires cueing 10 - 24% of the time  Memory Memory assist level: Recognizes or recalls 50 - 74% of the time/requires cueing 25 - 49% of the time     01/14/2016, 11:21 AM

## 2016-01-14 NOTE — Progress Notes (Signed)
Occupational Therapy Discharge Summary and OT Intervention  Patient Details  Name: Kelly Wilson MRN: [id number] Date of Birth: March 04, 1956  Today's Date: 01/14/2016 OT Individual Time: 1583-0940 and 1530-1600 OT Individual Time Calculation (min): 30 min and 30 min    Patient has met 13 of 13 long term goals due to improved activity tolerance, improved balance, ability to compensate for deficits, improved attention, improved awareness and improved coordination.  Patient to discharge at overall Supervision level.  Patient's care partner is independent to provide the necessary cognitive assistance at discharge.    Reasons goals not met: all goals met  Recommendation:  Patient will benefit from ongoing skilled OT services in outpatient setting to continue to advance functional skills in the area of BADL and iADL.  Equipment: recommended shower chair  Reasons for discharge: treatment goals met  Patient/family agrees with progress made and goals achieved: Yes   OT Intervention: Session 1: Upon entering the room, pt seated in recliner chair with daughter present in room for family education. Pt with no c/o pain this session. Pt propels wheelchair to dresser in order to obtain items needed to change clothing with supervision from daughter. Pt then asked daughter to step out as her husband will be providing supervision for this task at home. Pt performed dressing tasks with sit <>stand from commode in bathroom with use of RW and supervision for safety. OT educated her daughter on shower chair recommendation with supervision for transfers in and out of shower as well as if pt is standing in shower at any time in order to decrease fall risk. She verbalized understanding. Pt standing with RW at sink in order to performing grooming tasks with support of 1 or no UE at sink with supervision for task for safety. Pt returning to recliner chair at end of session with call bell and all needed items within  reach. OT answering any other questions from family and pt before exiting the room.   Session 2: Upon entering the room, pt sleeping in bed with no c/o pain this session. Pt performed bed mobility with supervision. Pt ambulating with RW into bathroom for toileting at supervision level overall. Pt ambulated with RW 100' towards RN station with supervision to ask discharge related to questions. OT educating pt on discharge recommendations for outpatient therapy and answering questions for pt as needed. Pt returning to room in same manner. Pt returning to bed with call bell and all needed items within reach upon exiting the room.   OT Discharge Precautions/Restrictions  Precautions Precautions: Fall Restrictions Weight Bearing Restrictions: No  Pain Pain Assessment Pain Assessment: No/denies pain Pain Score: 5  Pain Type: Acute pain Pain Location: Head Pain Descriptors / Indicators: Headache Pain Onset: Gradual Pain Intervention(s): Other (Comment) (premedicated) Vision/Perception  Vision- History Baseline Vision/History: Wears glasses Wears Glasses: At all times Patient Visual Report: No change from baseline  Cognition Overall Cognitive Status: Impaired/Different from baseline Arousal/Alertness: Awake/alert Orientation Level: Oriented to person;Oriented to place;Oriented to time Attention: Selective Sustained Attention: Impaired Sustained Attention Impairment: Verbal complex;Functional complex Selective Attention: Impaired Selective Attention Impairment: Verbal complex;Functional complex Memory: Impaired Memory Impairment: Decreased recall of new information Decreased Long Term Memory: Verbal complex Decreased Short Term Memory: Verbal complex;Functional complex Awareness Impairment: Anticipatory impairment Problem Solving: Impaired Problem Solving Impairment: Verbal complex;Functional complex Executive Function: Organizing;Reasoning Reasoning: Impaired Reasoning Impairment:  Verbal complex;Functional complex Organizing: Impaired Organizing Impairment: Verbal complex;Functional complex Behaviors: Impulsive;Perseveration Safety/Judgment: Impaired Sensation Sensation Light Touch: Appears Intact Stereognosis: Not tested Hot/Cold:  Not tested Proprioception: Appears Intact Coordination Gross Motor Movements are Fluid and Coordinated: Yes Fine Motor Movements are Fluid and Coordinated: Yes Motor  Motor Motor: Abnormal postural alignment and control Mobility  Bed Mobility Supine to Sit: 5: Supervision Sit to Supine: 5: Supervision  Trunk/Postural Assessment  Cervical Assessment Cervical Assessment: Within Functional Limits Thoracic Assessment Thoracic Assessment: Within Functional Limits Lumbar Assessment Lumbar Assessment: Within Functional Limits Postural Control Postural Control:  (lateral LOB with head movements and sudden turns)  Balance Balance Balance Assessed: Yes Static Sitting Balance Static Sitting - Level of Assistance: 7: Independent Dynamic Sitting Balance Dynamic Sitting - Level of Assistance: 6: Modified independent (Device/Increase time) Static Standing Balance Static Standing - Level of Assistance: 5: Stand by assistance Dynamic Standing Balance Dynamic Standing - Level of Assistance: 5: Stand by assistance Extremity/Trunk Assessment RUE Assessment RUE Assessment: Within Functional Limits LUE Assessment LUE Assessment: Within Functional Limits   See Function Navigator for Current Functional Status.  Kelly Wilson 01/14/2016, 12:39 PM

## 2016-01-15 MED ORDER — TRAMADOL HCL 50 MG PO TABS: 50.0000 mg | ORAL_TABLET | Freq: Four times a day (QID) | ORAL | Status: DC | PRN

## 2016-01-15 MED ORDER — POTASSIUM CHLORIDE CRYS ER 20 MEQ PO TBCR: 20.0000 meq | EXTENDED_RELEASE_TABLET | Freq: Every day | ORAL | Status: DC

## 2016-01-15 MED ORDER — SODIUM CHLORIDE 1 G PO TABS: 2.0000 g | ORAL_TABLET | Freq: Two times a day (BID) | ORAL | Status: DC

## 2016-01-15 MED ORDER — MAGNESIUM GLUCONATE 500 MG PO TABS: 500.0000 mg | ORAL_TABLET | Freq: Every day | ORAL | Status: DC

## 2016-01-15 MED ORDER — VALPROIC ACID 250 MG PO CAPS: 500.0000 mg | ORAL_CAPSULE | Freq: Every day | ORAL | Status: DC

## 2016-01-15 MED ORDER — TRIAMTERENE-HCTZ 37.5-25 MG PO TABS: 1.0000 | ORAL_TABLET | Freq: Every day | ORAL | Status: DC

## 2016-01-15 MED ORDER — AMANTADINE HCL 100 MG PO CAPS: 100.0000 mg | ORAL_CAPSULE | Freq: Two times a day (BID) | ORAL | Status: DC

## 2016-01-15 MED ORDER — LEVETIRACETAM 500 MG PO TABS: 500.0000 mg | ORAL_TABLET | Freq: Two times a day (BID) | ORAL | Status: DC

## 2016-01-15 MED ORDER — OMEPRAZOLE 40 MG PO CPDR: 40.0000 mg | DELAYED_RELEASE_CAPSULE | Freq: Every day | ORAL | Status: DC

## 2016-01-15 NOTE — Progress Notes (Signed)
Patient was given discharge information from Linna Hoff A., PA, and all questions answered. Belongings packed by husband and taken with the patient. Patient assisted to car via wheelchair with the nurse tech's assistance. [place]

## 2016-01-15 NOTE — Progress Notes (Signed)
Social Work  Discharge Note  The overall goal for the admission was met for:   Discharge location: Yes - home with family to provide 24/7 supervision (spouse and daughter)  Length of Stay: Yes - 19 days  Discharge activity level: Yes - supervision  Home/community participation: Yes - supervision  Services provided included: MD, RD, PT, OT, SLP, RN, TR, Pharmacy, Neuropsych and SW  Financial Services: Private Insurance: BCBS  Follow-up services arranged: Outpatient: PT, OT, ST via [facility] and Patient/Family has no preference for HH/DME agencies  Comments (or additional information):  Patient/Family verbalized understanding of follow-up arrangements: Yes  Individual responsible for coordination of the follow-up plan: pt/spouse  Confirmed correct DME delivered: NA - no needs    ,  

## 2016-01-15 NOTE — Progress Notes (Signed)
[place] PHYSICAL MEDICINE & REHABILITATION     PROGRESS NOTE    Subjective/Complaints: Pt sitting at the edge of the bed, anxious for discharge. She was questions for when to be concerned with headaches.   ROS: Denies CP, COB, N/V/D.  Objective: Vital Signs: Blood pressure 120/58, pulse 73, temperature 98.6 F (37 C), temperature source Oral, resp. rate 16, height 5\' 6"  (1.676 m), weight 104.8 kg (231 lb 0.7 oz), SpO2 97 %. No results found.  Recent Labs  01/14/16 0612  WBC 3.4*  HGB 11.5*  HCT 34.6*  PLT 302    Recent Labs  01/14/16 0612  NA 137  K 3.2*  CL 107  GLUCOSE 95  BUN 6  CREATININE 0.72  CALCIUM 8.9   CBG (last 3)  No results for input(s): GLUCAP in the last 72 hours.  Wt Readings from Last 3 Encounters:  01/11/16 104.8 kg (231 lb 0.7 oz)  12/16/15 108.863 kg (240 lb)  11/05/15 114.306 kg (252 lb)    Physical Exam:  Constitutional: She appears well-developed. No distress. Obese  HENT: Normocephalic and atraumatic.  Eyes: EOM and Conj are normal.  Neck: Normal range of motion. Neck supple. No JVD present. No tracheal deviation present. No thyromegaly present.  Posterior head incision intact  Cardiovascular: regular rhythm and rate.  Respiratory: Effort normal and breath sounds normal. No respiratory distress.  GI: Soft. Bowel sounds are normal. She exhibits no distension.  Neurological: She is alert and oriented.    Strength 4+-5/5 bilateral upper ext and 4+-5/5 HF, 5/5 KE and 5/5 ADFPF.  Skin: She is not diaphoretic. Warm and dry. Surgical incision c/d/i. Red/macular rash on back, resolved.  Psychiatric: She has a flat affect.      Assessment/Plan: 1. Impaired functional mobility and self-care secondary to IVH/hydrocephalus which require 3+ hours per day of interdisciplinary therapy in a comprehensive inpatient rehab setting. Physiatrist is providing close team supervision and 24 hour management of active medical problems listed  below. Physiatrist and rehab team continue to assess barriers to discharge/monitor patient progress toward functional and medical goals.  Function:  Bathing Bathing position Bathing activity did not occur: Refused Position: Shower  Bathing parts Body parts bathed by patient: Chest, Abdomen, Front perineal area, Left upper leg, Right upper leg, Buttocks, Right arm, Left arm, Right lower leg, Left lower leg Body parts bathed by helper: Back  Bathing assist Assist Level: Supervision or verbal cues      Upper Body Dressing/Undressing Upper body dressing   What is the patient wearing?: Bra, Pull over shirt/dress Bra - Perfomed by patient: Thread/unthread right bra strap, Hook/unhook bra (pull down sports bra), Thread/unthread left bra strap Bra - Perfomed by helper: Hook/unhook bra (pull down sports bra) Pull over shirt/dress - Perfomed by patient: Thread/unthread right sleeve, Thread/unthread left sleeve, Put head through opening, Pull shirt over trunk Pull over shirt/dress - Perfomed by helper: Pull shirt over trunk        Upper body assist Assist Level: Supervision or verbal cues   Set up : To obtain clothing/put away  Lower Body Dressing/Undressing Lower body dressing   What is the patient wearing?: Underwear, Pants, Socks, Shoes Underwear - Performed by patient: Thread/unthread right underwear leg, Thread/unthread left underwear leg, Pull underwear up/down Underwear - Performed by helper: Pull underwear up/down Pants- Performed by patient: Thread/unthread right pants leg, Thread/unthread left pants leg, Pull pants up/down Pants- Performed by helper: Thread/unthread right pants leg, Thread/unthread left pants leg Non-skid slipper socks- Performed  by patient: Don/doff right sock, Don/doff left sock Non-skid slipper socks- Performed by helper: Don/doff right sock, Don/doff left sock Socks - Performed by patient: Don/doff right sock, Don/doff left sock   Shoes - Performed by patient:  Don/doff right shoe, Don/doff left shoe, Fasten right, Fasten left Shoes - Performed by helper: Fasten right, Fasten left       TED Hose - Performed by helper: Don/doff right TED hose, Don/doff left TED hose  Lower body assist Assist for lower body dressing: Supervision or verbal cues      Toileting Toileting   Toileting steps completed by patient: Adjust clothing prior to toileting, Performs perineal hygiene, Adjust clothing after toileting Toileting steps completed by helper: Adjust clothing after toileting, Performs perineal hygiene Toileting Assistive Devices: Grab bar or rail  Toileting assist Assist level: Supervision or verbal cues   Transfers Chair/bed transfer   Chair/bed transfer method: Ambulatory Chair/bed transfer assist level: Supervision or verbal cues Chair/bed transfer assistive device: Medical sales representative     Max distance: 150 Assist level: Supervision or verbal cues   Wheelchair Wheelchair activity did not occur: N/A Type: Manual Max wheelchair distance: 150 Assist Level: No help, No cues, assistive device, takes more than reasonable amount of time  Cognition Comprehension Comprehension assist level: Understands complex 90% of the time/cues 10% of the time  Expression Expression assist level: Expresses complex 90% of the time/cues < 10% of the time  Social Interaction Social Interaction assist level: Interacts appropriately with others - No medications needed.  Problem Solving Problem solving assist level: Solves basic 50 - 74% of the time/requires cueing 25 - 49% of the time  Memory Memory assist level: Recognizes or recalls 50 - 74% of the time/requires cueing 25 - 49% of the time   Medical Problem List and Plan: 1. 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   2. DVT Prophylaxis/Anticoagulation: Subcutaneous Lovenox initiated 12/22/2015. Monitor for any bleeding episodes 3. Pain Management: Neurontin 900 mg every 8 hours.  4. Hypertension. Maxide daily,Nimotop 60 mg every 4 hours 21 days initiated 12/17/2015  Will cont to monitor, overall controlled 5. Neuropsych: This patient is capable of making decisions on her own behalf. 6. Skin/Wound Care: Routine skin checks 7. Fluids/Electrolytes/Nutrition:  Discussed intake with patient   Ate 100% of her meals yesterday, improved  Added megace to help stimulate appetite. Continue to encourage intake.  Hypokalemia: 3.4 on 6/27, supplemented  Boost started on 6/20 8. Seizure prophylaxis. Keppra 500 mg twice a day, valproic acid 500 mg daily. EEG negative 9. Hyponatremia.   Continue sodium chloride tablets for now.  Na 137 on 6/27 (stable range) 10. Heat rash--resolved  Continue hypoallergenic sheets 11. UTI  UA positive for bacteria, U culture multi-species  Completed Macrobid 12. Leukopenia  Appears to be trending down?  Will need close follow up as outpt  LOS (Days) 19 A FACE TO FACE EVALUATION WAS PERFORMED  Ankit Lorie Phenix 01/15/2016 8:52 AM

## 2016-01-15 NOTE — Discharge Summary (Signed)
Please see discharge summary in chart.

## 2016-01-15 NOTE — Discharge Summary (Signed)
Kelly Wilson, Kelly Wilson NO.:  [id number]  MEDICAL RECORD NO.:  [id number]  LOCATION:  4M10C                        FACILITY:  [facility]  PHYSICIAN:  Delice Lesch, MD        DATE OF BIRTH:  [birth year]  DATE OF ADMISSION:  12/26/2015 DATE OF DISCHARGE:  01/15/2016                              DISCHARGE SUMMARY   DISCHARGE DIAGNOSES: 1. 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 2. Subcutaneous Lovenox for DVT prophylaxis.   3. Pain management. 4. Hypertension.   5. Seizure prophylaxis.   6. Hyponatremia resolved. 7. Poor appetite 8. Hypokalemia 9. Heat rash 10. Urinary tract infection. 11. Leukopenia  HISTORY OF PRESENT ILLNESS:  This is a 60 year old right-handed female with history of hypertension, TIA.  Presented on Dec 16, 2015, with acute onset of headache and nausea as well as vertigo.  Independent prior to admission.  CT of the head showed acute hemorrhage involving basilar cisterns 3rd and 4th ventricles, mass effect on brain stem with early hydrocephalus.  CT angiogram of the head showed a 3 x 5 mm aneurysm distal right PICA felt to be cause of hemorrhage into the 4th ventricle and underwent suboccipital craniectomy and clipping of aneurysm on Dec 17, 2015, per Dr. Kathyrn Sheriff.  Placed on Keppra and valproic acid for seizure prophylaxis.  EEG suggestive of moderate encephalopathy, no seizure activity.  Decadron protocol initiated. Close monitoring of blood pressure.  On December 23, 2015, became more lethargic, obtunded.  CT of the head showed some interval development of hydrocephalus, again advised to monitor, mental status continued to improve.  Followup CT of the head showed decrease in dilatation of the lateral and 3rd ventricles compared to prior exam.  Mild hyponatremia 126 to 130, suspect SIADH close monitoring of fluid  restriction.  Diet slowly advanced.  Subcutaneous Lovenox for DVT prophylaxis, December 22, 2015.  The patient was admitted for comprehensive rehab program.  PAST MEDICAL HISTORY:  See discharge diagnoses.  SOCIAL HISTORY:  Lives with spouse. Independent prior to admission, working part-time.  Functional status upon admission to rehab services was +2 physical assist, ambulate 3 feet rolling walker; moderate assist sit to stand; mod to max assist for activities of daily living.  PHYSICAL EXAMINATION:  VITAL SIGNS:  Blood pressure 144/64, pulse 88, temperature 98, respirations 18. GENERAL:  This was an alert female, in no acute distress.  She had some perseveration, followed simple commands, oriented to year and month, fair insight of deficits. LUNGS:  Clear to auscultation without wheeze. CARDIAC:  Regular rate and rhythm without murmur. ABDOMEN:  Soft, nontender.  Good bowel sounds.  REHABILITATION HOSPITAL COURSE:  The patient was admitted to inpatient rehab services with therapies initiated on a 3-hour daily basis, consisting of physical therapy, occupational therapy, speech therapy, and rehabilitation nursing.  The following issues were addressed during the patient's rehabilitation stay.  Pertaining to Ms. Counts's IVH PICA aneurysm with surgical clipping, she would follow up with Neurosurgery.  Subcutaneous Lovenox for DVT prophylaxis, no bleeding episodes.  She continued on Keppra as well as valproic acid for  seizure prophylaxis.  EEG negative.  Hyponatremia stabilized, sodium 135 and monitored.  She had completed a course of Macrobid for multiple species urinary tract infection.  The patient received weekly collaborative interdisciplinary team conferences to discuss estimated length of stay, family teaching, any barriers to discharge.  Ambulating 796 feet with rolling walker at supervision, occasional loss of balance, but she was able to self correct.  She could gather belongings  for activities of daily living and homemaking, ambulate to the shower, performed stand pivot shower transfers with steady assistance.  Speech therapy follow up, she could communicate her needs without a problem.  Focused on therapy schedules, general sustained attention 15 minutes with occasional cuing.  It was discussed with family the need for supervision for her safety.  She was discharged to home after family teaching completed.  DISCHARGE MEDICATIONS: 1. Amantadine 100 mg p.o. b.i.d. 2. Keppra 500 mg p.o. b.i.d. 3. Magnesium gluconate 500 mg p.o. daily. 4. Protonix 40 mg p.o. daily. 5. Ultram 50 mg p.o. every 6 hours as needed pain, dispense of 90     tablets.  Maxzide 37.5-25 mg p.o. daily. 6. Valproic acid 500 mg p.o. Daily. 7. Sodium chloride tablets 2 g twice a day  DIET:  Regular.  FOLLOWUP:  She would follow up with Dr. Posey Pronto at the outpatient rehab service office as directed; Dr. Kathyrn Sheriff, Neurosurgery, 2 weeks call for appointment; Dr. [name], medical management and close follow up for ?leukopenia and trend of WBCs.     Lauraine Rinne, P.A.   ______________________________ Delice Lesch, MD    DA/MEDQ  D:  01/14/2016  T:  01/15/2016  Job:  [id number]  cc:   Kathyrn Sheriff, Dr. Jacelyn Pi. Loanne Drilling, MD

## 2016-01-16 ENCOUNTER — Ambulatory Visit: Payer: BLUE CROSS/BLUE SHIELD | Attending: Physical Medicine & Rehabilitation

## 2016-01-16 ENCOUNTER — Telehealth: Payer: Self-pay

## 2016-01-16 NOTE — Telephone Encounter (Signed)
  Spoke with patient.   1. Are you/is patient experiencing any problems since coming home? Are there any questions regarding any aspect of care? No  2. Are there any questions regarding medications administration/dosing? Are meds being taken as prescribed? Patient should review meds with caller to confirm. Meds have been confirmed.  3. Have there been any falls? No falls. 4. Has Home Health been to the house and/or have they contacted you? If not, have you tried to contact them? Can we help you contact them? Outpatient therapy has not gotten in touch.  5. Are bowels and bladder emptying properly? Are there any unexpected incontinence issues? If applicable, is patient following bowel/bladder programs? No 6. Any fevers, problems with breathing, unexpected pain? No issues 7. Are there any skin problems or new areas of breakdown? No issues 8. Has the patient/family member arranged specialty MD follow up (ie cardiology/neurology/renal/surgical/etc)?  Can we help arrange? Has not made follow up appointments yet.  9. Does the patient need any other services or support that we can help arrange? No 10. Are caregivers following through as expected in assisting the patient? Family  11. Has the patient quit smoking, drinking alcohol, or using drugs as recommended? Pt is not smoking, drinking, or using drugs.  Pt is aware of her appointment on 01/23/16 at 9:30am with AP.

## 2016-01-22 ENCOUNTER — Encounter: Payer: Self-pay | Admitting: Physical Therapy

## 2016-01-22 ENCOUNTER — Ambulatory Visit: Payer: BLUE CROSS/BLUE SHIELD | Attending: Physical Medicine & Rehabilitation | Admitting: Physical Therapy

## 2016-01-22 DIAGNOSIS — R41844 Frontal lobe and executive function deficit: Secondary | ICD-10-CM | POA: Diagnosis present

## 2016-01-22 DIAGNOSIS — R488 Other symbolic dysfunctions: Secondary | ICD-10-CM | POA: Insufficient documentation

## 2016-01-22 DIAGNOSIS — R41841 Cognitive communication deficit: Secondary | ICD-10-CM | POA: Diagnosis present

## 2016-01-22 DIAGNOSIS — R42 Dizziness and giddiness: Secondary | ICD-10-CM | POA: Insufficient documentation

## 2016-01-22 DIAGNOSIS — R2681 Unsteadiness on feet: Secondary | ICD-10-CM | POA: Diagnosis present

## 2016-01-22 DIAGNOSIS — R2689 Other abnormalities of gait and mobility: Secondary | ICD-10-CM | POA: Diagnosis present

## 2016-01-22 NOTE — Patient Instructions (Signed)
Gaze Stabilization: Sitting    Wear your glasses for this exercise. While keeping eyes on target on wall 3-5 feet away, tilt head down 15-30 and move head side to side for __30__ seconds.  Do __2-3__ sessions per day.   Gaze Stabilization: Tip Card  1.Target must remain in focus, not blurry, and appear stationary while head is in motion. 2.Perform exercises with small head movements (45 to either side of midline). 3.Increase speed of head motion so long as target is in focus. 4.If you wear eyeglasses, be sure you can see target through lens (therapist will give specific instructions for bifocal / progressive lenses). 5.These exercises may provoke dizziness or nausea. Work through these symptoms. If too dizzy, slow head movement slightly. Rest between each exercise. 6.Exercises demand concentration; avoid distractions. 7.For safety, perform standing exercises close to a counter, wall, corner, or next to someone.  Copyright  VHI. All rights reserved.
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
## 2016-01-23 ENCOUNTER — Encounter: Payer: Self-pay | Admitting: Physical Medicine & Rehabilitation

## 2016-01-23 ENCOUNTER — Encounter
Payer: BLUE CROSS/BLUE SHIELD | Attending: Physical Medicine & Rehabilitation | Admitting: Physical Medicine & Rehabilitation

## 2016-01-23 VITALS — BP 144/84 | HR 89 | Resp 17

## 2016-01-23 DIAGNOSIS — I607 Nontraumatic subarachnoid hemorrhage from unspecified intracranial artery: Secondary | ICD-10-CM

## 2016-01-23 DIAGNOSIS — I729 Aneurysm of unspecified site: Secondary | ICD-10-CM | POA: Diagnosis not present

## 2016-01-23 DIAGNOSIS — Z8673 Personal history of transient ischemic attack (TIA), and cerebral infarction without residual deficits: Secondary | ICD-10-CM | POA: Diagnosis not present

## 2016-01-23 DIAGNOSIS — K219 Gastro-esophageal reflux disease without esophagitis: Secondary | ICD-10-CM | POA: Diagnosis not present

## 2016-01-23 DIAGNOSIS — G919 Hydrocephalus, unspecified: Secondary | ICD-10-CM | POA: Diagnosis present

## 2016-01-23 DIAGNOSIS — R531 Weakness: Secondary | ICD-10-CM | POA: Diagnosis not present

## 2016-01-23 DIAGNOSIS — M1990 Unspecified osteoarthritis, unspecified site: Secondary | ICD-10-CM | POA: Insufficient documentation

## 2016-01-23 DIAGNOSIS — D72819 Decreased white blood cell count, unspecified: Secondary | ICD-10-CM

## 2016-01-23 DIAGNOSIS — Z298 Encounter for other specified prophylactic measures: Secondary | ICD-10-CM

## 2016-01-23 DIAGNOSIS — E785 Hyperlipidemia, unspecified: Secondary | ICD-10-CM | POA: Insufficient documentation

## 2016-01-23 DIAGNOSIS — Z418 Encounter for other procedures for purposes other than remedying health state: Secondary | ICD-10-CM

## 2016-01-23 DIAGNOSIS — R630 Anorexia: Secondary | ICD-10-CM | POA: Diagnosis not present

## 2016-01-23 DIAGNOSIS — Z9889 Other specified postprocedural states: Secondary | ICD-10-CM | POA: Insufficient documentation

## 2016-01-23 DIAGNOSIS — I10 Essential (primary) hypertension: Secondary | ICD-10-CM | POA: Insufficient documentation

## 2016-01-23 NOTE — Progress Notes (Signed)
Subjective:    Patient ID: Kelly Wilson, female    DOB: 05/27/1956, 60 y.o.   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 DATE OF ADMISSION:  12/26/2015 DATE OF DISCHARGE:  01/15/2016 At discharge she was instructed to follow up with Neurosurg.  She has an appointment with PCP on 7/12.  She states she is complaint with all medications prescribed. She states she does not have an appetite and inquires about medications side effects. Overall, she states she is doing well. Denies headaches.   DME: Pt previously owned Mobility: Conservation officer, nature Therapies: 3/week PT/OT/SLP.  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 General activity 0 Relation with others 0 Enjoyment of life 0 What TIME of day is your pain at its worst? evening Sleep (in general) NA  Pain is worse with: NA Pain improves with: NA Relief from Meds: NA  Mobility use a walker Do you have any goals in this area?  no  Function employed # of hrs/week 30 Do you have any goals in this area?  yes  Neuro/Psych weakness  Prior Studies NA  Physicians involved in your care Primary care .   Family History  Problem Relation Age of Onset  . Coronary artery disease Mother   . Diabetes Mother   . Coronary artery disease Father   . Diabetes Sister     2 sister with DM  . Cancer Other     uncle had rectal cancer  . Hypertension Other   . Hyperlipidemia Other    Social History   Social History  . Marital Status: Married    Spouse Name: N/A  . Number of  Children: N/A  . Years of Education: N/A   Occupational History  . realtor    Social History Main Topics  . Smoking status: Never Smoker   . Smokeless tobacco: None     Comment: Married, she is a Cabin crew  . Alcohol Use: No  . Drug Use: No  . Sexual Activity: Not Asked   Other Topics Concern  . None   Social History Narrative   Married, Cabin crew, 2 daughters   Caffeine use: tea daily   Past Surgical History  Procedure Laterality Date  . Abdominal hysterectomy  1995  . Tubal ligation    . External ear surgery      left side x's 2  . Stapedectomy    . Throidectomy    . Orif elbow fracture  02/2011  . Skin graft full thickness arm    . Skin graft full thickness leg    . Cesarean section  1993  . Ankle surgery  2005  . Thyroidectomy, partial Right 1980  . Knee surgery  02/2011  . Craniotomy Right 12/17/2015    Procedure: Suboccipital CRANIectomy INTRACRANIAL ANEURYSM FOR VERTEBRAL/BASILAR;  Surgeon: Consuella Lose, MD;  Location: [facility];  Service: Neurosurgery;  Laterality: Right;  suboccipital   Past Medical History  Diagnosis Date  . Hyperlipidemia   . Hypertension   . 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 HYPERLIPIDEMIA 05/17/2007  . HYPERTENSION 05/17/2007  . GERD 08/09/2008  . OSTEOARTHRITIS 05/17/2007  .  SPINAL STENOSIS 05/17/2007  . TRANSIENT ISCHEMIC ATTACK, HX OF 05/17/2007  . Left ear hearing loss     Mild   BP 144/84 mmHg  Pulse 89  Resp 17  SpO2 96%  Opioid Risk Score:   Fall Risk Score:  `1  Depression screen PHQ 2/9  No flowsheet data found.  Review of Systems  Neurological: Positive for weakness.  All other systems reviewed and are negative.     Objective:   Physical Exam Constitutional: She appears well-developed. No distress. Obese  HENT:  Normocephalic and atraumatic.  Eyes: EOM and Conj are normal.   Cardiovascular: regular rhythm and rate.   Respiratory: Effort normal and breath sounds normal. No respiratory distress.   GI:  Soft. Bowel sounds are normal. She exhibits no distension.  Neurological: She is alert and oriented.    Strength 5/5 bilateral upper ext and 5/5 HF, 5/5 KE and 5/5 ADFPF.  Neg ataxia/dysmetria. Skin: She is not diaphoretic. Warm and dry. Surgical incision c/d/i. Red/macular rash on back, resolved.  Psychiatric: She has a flat affect.       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   1.  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  HTN  Cont Maxide daily  Follow up with PCP  3. Poor appetite  Pt to discuss with Neurosurg regarding anti-epilectic prophylaxis  Trial of supplements, Boost, ensure, etc.  4. Seizure prophylaxis  Cont Keppra 500 mg twice a day, valproic acid 500 mg daily.    5. Leukopenia  On last CBC, WBCs appeared to be trending down  Pt to follow up with PCP for routine lab work and follow up  Meds reviewed Referral reviewed All questions answered

## 2016-01-23 NOTE — Patient Instructions (Signed)
Please call Dr. Lajuana Ripple and schedule followup up if one not already scheduled

## 2016-01-26 ENCOUNTER — Encounter (HOSPITAL_COMMUNITY): Payer: Self-pay | Admitting: *Deleted

## 2016-01-26 ENCOUNTER — Emergency Department (HOSPITAL_COMMUNITY)
Admission: EM | Admit: 2016-01-26 | Discharge: 2016-01-26 | Disposition: A | Discharge status: Home / Self Care | Payer: BLUE CROSS/BLUE SHIELD | Attending: Physician Assistant | Admitting: Physician Assistant

## 2016-01-26 DIAGNOSIS — R339 Retention of urine, unspecified: Secondary | ICD-10-CM | POA: Diagnosis not present

## 2016-01-26 DIAGNOSIS — Z9189 Other specified personal risk factors, not elsewhere classified: Secondary | ICD-10-CM

## 2016-01-26 DIAGNOSIS — Z79899 Other long term (current) drug therapy: Secondary | ICD-10-CM | POA: Diagnosis not present

## 2016-01-26 DIAGNOSIS — Z9114 Patient's other noncompliance with medication regimen: Secondary | ICD-10-CM | POA: Diagnosis not present

## 2016-01-26 DIAGNOSIS — I10 Essential (primary) hypertension: Secondary | ICD-10-CM | POA: Diagnosis not present

## 2016-01-26 LAB — CBC WITH DIFFERENTIAL/PLATELET
Basophils Absolute: 0 10*3/uL (ref 0.0–0.1)
Basophils Relative: 0 %
Eosinophils Absolute: 0.1 10*3/uL (ref 0.0–0.7)
Eosinophils Relative: 1 %
HCT: 41.6 % (ref 36.0–46.0)
HEMOGLOBIN: 13.9 g/dL (ref 12.0–15.0)
LYMPHS ABS: 1.8 10*3/uL (ref 0.7–4.0)
LYMPHS PCT: 33 %
MCH: 28.5 pg (ref 26.0–34.0)
MCHC: 33.4 g/dL (ref 30.0–36.0)
MCV: 85.4 fL (ref 78.0–100.0)
Monocytes Absolute: 0.7 10*3/uL (ref 0.1–1.0)
Monocytes Relative: 13 %
NEUTROS PCT: 53 %
Neutro Abs: 2.8 10*3/uL (ref 1.7–7.7)
Platelets: 290 10*3/uL (ref 150–400)
RBC: 4.87 MIL/uL (ref 3.87–5.11)
RDW: 14.6 % (ref 11.5–15.5)
WBC: 5.3 10*3/uL (ref 4.0–10.5)

## 2016-01-26 LAB — BASIC METABOLIC PANEL
Anion gap: 8 (ref 5–15)
BUN: 7 mg/dL (ref 6–20)
CHLORIDE: 107 mmol/L (ref 101–111)
CO2: 22 mmol/L (ref 22–32)
Calcium: 9.4 mg/dL (ref 8.9–10.3)
Creatinine, Ser: 0.82 mg/dL (ref 0.44–1.00)
GFR calc Af Amer: >60 mL/min (ref >60)
GFR calc non Af Amer: >60 mL/min (ref >60)
GLUCOSE: 93 mg/dL (ref 65–99)
POTASSIUM: 3.8 mmol/L (ref 3.5–5.1)
SODIUM: 137 mmol/L (ref 135–145)

## 2016-01-26 LAB — URINE MICROSCOPIC-ADD ON

## 2016-01-26 LAB — URINALYSIS, ROUTINE W REFLEX MICROSCOPIC
Glucose, UA: NEGATIVE mg/dL
HGB URINE DIPSTICK: NEGATIVE
KETONES UR: 40 mg/dL — AB
Nitrite: NEGATIVE
Protein, ur: 30 mg/dL — AB
SPECIFIC GRAVITY, URINE: 1.027 (ref 1.005–1.030)
pH: 6.5 (ref 5.0–8.0)

## 2016-01-26 NOTE — ED Notes (Signed)
Pt's husband at bedside states they would like to leave now because we are freezing her. [place], [place] notified

## 2016-01-26 NOTE — ED Notes (Signed)
Patient states she has been unable to void since this AM.  May be from some of the new meds she has been put on since her brain anurysm

## 2016-01-26 NOTE — Discharge Instructions (Signed)
Drink enough water to stay hydrated, if you are unable to urinate at home please return to the ER as needed

## 2016-01-26 NOTE — ED Notes (Signed)
Pt and husband displeased by temperature in room given 2 additional blankets.  Pt had 2 sheets and 4 blankets covering her.  Thermostat in room as high as it will go to warm.

## 2016-01-26 NOTE — ED Provider Notes (Signed)
CSN: [id number]     Arrival date & time 01/26/16  1903 History   First MD Initiated Contact with Patient 01/26/16 2017     Chief Complaint  Patient presents with  . Urinary Retention     (Consider location/radiation/quality/duration/timing/severity/associated sxs/prior Treatment) HPI   Patient here for treatment for urinary retention. 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 She was started on multiple medications including seizure prophylactic medication; Amantadine, Keppra, and valproic acid. She has been doing well since the procedure and today denies having any headache, confusion, weakness, fevers, N/V/D. She reports having a small amount of urine void this morning but otherwise being unable to void. She has had liquids to drink but has eaten very little which she says has been normal since her brain surgery. She feels lower abdomen distention but denies that she is having severe pain. Denies having any dysuria, hematuria. She says this has happened once before in the past many years ago after delivering one of her children, but otherwise this is a completely new problem for her. No back pain, no neck pain, no recent fall or trauma. Denies numbness or tingling to extremities.  MED LIST AT [facility]  1. Amantadine 100 mg p.o. b.i.d. 2. Keppra 500 mg p.o. b.i.d. 3. Magnesium gluconate 500 mg p.o. daily. 4. Protonix 40 mg p.o. daily. 5. Ultram 50 mg p.o. every 6 hours as needed pain, dispense of 90  tablets. Maxzide 37.5-25 mg p.o. daily. 6. Valproic acid 500 mg p.o. Daily. 7. Sodium chloride tablets 2 g twice a day  Past Medical History  Diagnosis Date  . Hyperlipidemia   . Hypertension   . 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 HYPERLIPIDEMIA 05/17/2007  . HYPERTENSION 05/17/2007  . GERD 08/09/2008  . OSTEOARTHRITIS 05/17/2007  . SPINAL STENOSIS 05/17/2007  . TRANSIENT ISCHEMIC ATTACK, HX OF 05/17/2007  . Left ear hearing loss     Mild    Past Surgical History  Procedure Laterality Date  . Abdominal hysterectomy  1995  . Tubal ligation    . External ear surgery      left side x's 2  . Stapedectomy    . Throidectomy    . Orif elbow fracture  02/2011  . Skin graft full thickness arm    . Skin graft full thickness leg    . Cesarean section  1993  . Ankle surgery  2005  . Thyroidectomy, partial Right 1980  . Knee surgery  02/2011  . Craniotomy Right 12/17/2015    Procedure: Suboccipital CRANIectomy INTRACRANIAL ANEURYSM FOR VERTEBRAL/BASILAR;  Surgeon: Consuella Lose, MD;  Location: [facility];  Service: Neurosurgery;  Laterality: Right;  suboccipital   Family History  Problem Relation Age of Onset  . Coronary artery disease Mother   . Diabetes Mother   . Coronary artery disease Father   . Diabetes Sister     2 sister with DM  . Cancer Other     uncle had rectal cancer  . Hypertension Other   . Hyperlipidemia Other    Social History  Substance Use Topics  . Smoking status: Never Smoker   . Smokeless tobacco: Never Used     Comment: Married, she is a Cabin [employer]  . Alcohol Use: No   OB History    No data available     Review of Systems  Review of Systems All other systems negative except as documented in the HPI. All pertinent positives and negatives as reviewed in the HPI.  Allergies  Morphine and Rosuvastatin  Home Medications   Prior to Admission medications   Medication Sig Start Date End Date Taking? Authorizing Provider  amantadine (SYMMETREL) 100 MG capsule Take 1 capsule (100 mg total) by mouth 2 (two) times daily with breakfast and lunch. 01/15/16   Lavon Paganini Angiulli, PA-C  levETIRAcetam (KEPPRA) 500 MG tablet Take 1 tablet (500 mg total) by mouth 2 (two) times daily. 01/15/16   Lavon Paganini Angiulli, PA-C  magnesium gluconate (MAGONATE) 500 MG tablet Take 1 tablet (500 mg total) by mouth daily. 01/15/16   Lavon Paganini Angiulli, PA-C  omeprazole (PRILOSEC) 40 MG capsule Take 1 capsule (40 mg total)  by mouth daily. 01/15/16   Lavon Paganini Angiulli, PA-C  potassium chloride SA (K-DUR,KLOR-CON) 20 MEQ tablet Take 1 tablet (20 mEq total) by mouth daily. 01/15/16   Lavon Paganini Angiulli, PA-C  sodium chloride 1 g tablet Take 2 tablets (2 g total) by mouth 2 (two) times daily with a meal. 01/15/16   Lavon Paganini Angiulli, PA-C  traMADol (ULTRAM) 50 MG tablet Take 1 tablet (50 mg total) by mouth every 6 (six) hours as needed (for moderate to severe pain). 01/15/16   Lavon Paganini Angiulli, PA-C  triamterene-hydrochlorothiazide (MAXZIDE-25) 37.5-25 MG tablet Take 1 tablet by mouth daily. 01/15/16   Lavon Paganini Angiulli, PA-C  valproic acid (DEPAKENE) 250 MG capsule Take 2 capsules (500 mg total) by mouth daily. 01/15/16   Daniel J Angiulli, PA-C   BP 140/83 mmHg  Pulse 79  Temp(Src) 98.1 F (36.7 C) (Oral)  Resp 18  Ht 5\' 6"  (1.676 m)  Wt 99.791 kg  BMI 35.53 kg/m2  SpO2 99% Physical Exam  Constitutional: She appears well-developed and well-nourished. No distress.  HENT:  Head: Normocephalic and atraumatic.  Nose: Nose normal.  Mouth/Throat: Uvula is midline, oropharynx is clear and moist and mucous membranes are normal.  Eyes: Pupils are equal, round, and reactive to light.  Neck: Normal range of motion. Neck supple.  Cardiovascular: Normal rate and regular rhythm.   Pulmonary/Chest: Effort normal.  Abdominal: Soft. Bowel sounds are normal. She exhibits no distension. There is no tenderness. There is no rebound, no guarding and no CVA tenderness.  Exam is somewhat limited by body habitus.  Musculoskeletal:  No LE swelling  Neurological: She is alert.  Acting at baseline  Skin: Skin is warm and dry. No rash noted.  Nursing note and vitals reviewed.   ED Course  Procedures (including critical care time) Labs Review Labs Reviewed  URINE CULTURE  CBC WITH DIFFERENTIAL/PLATELET  BASIC METABOLIC PANEL  URINALYSIS, ROUTINE W REFLEX MICROSCOPIC (NOT AT [facility])    Imaging Review No results found. I have  personally reviewed and evaluated these images and lab results as part of my medical decision-making.   EKG Interpretation None      MDM   Final diagnoses:  Compliant with medication regimen    20:37:34 Genitourinary CH  Genitourinary - Bladder Scan Volume (mL): 45 mL      Bladder scan showed only 45 mL in patients bladder. She was given two cups of ice water. Her CBC and BMP are completely unremarkable. While waiting for the patient to produce urine she became irritated that it is so cold in her hospital room. She has accumulated six blankets so far. Her husband said that since her surgery she has stayed cold. She is requesting to leave before producing urine or getting a UA because she is uncomfortable. She see's two of her  doctors tomorrow and said she will return if she is unable to urinate at home.  Patient has not showed that she is truly retaining urine at this point and is safe to discharge with strict return precautions.  Blood pressure 140/83, pulse 79, temperature 98.1 F (36.7 C), temperature source Oral, resp. rate 18, height 5\' 6"  (1.676 m), weight 99.791 kg, SpO2 99 %.      Delos Haring, PA-C 01/26/16 2150  Courteney Julio Alm, MD 01/26/16 [phone]

## 2016-01-26 NOTE — ED Notes (Signed)
Pt stable, ambulatory, states understanding of discharge instructions 

## 2016-01-26 NOTE — ED Notes (Signed)
Pt in bathroom to give urine sample, husband at nurses station, D/C paperwork in hand.  Husband states they don't care to go over papers because they are leaving.  When wife came out of bathroom she was interested to go over the paperwork.  Urine sample sent off.  Husband was upset when leaving the room because the urine sample he had put on the counter was still there.  This nurse explained that typically the samples do not get left on the counter and instructions were given earlier to return sample to room.  Pt and husband were assured that sample will make it to the lab that I was unable to send while I was in room going over discharge paperwork.

## 2016-01-27 ENCOUNTER — Encounter: Payer: Self-pay | Admitting: Rehabilitation

## 2016-01-27 ENCOUNTER — Encounter: Payer: Self-pay | Admitting: Occupational Therapy

## 2016-01-27 ENCOUNTER — Telehealth: Payer: Self-pay | Admitting: Endocrinology

## 2016-01-27 ENCOUNTER — Ambulatory Visit: Payer: BLUE CROSS/BLUE SHIELD | Admitting: Rehabilitation

## 2016-01-27 ENCOUNTER — Ambulatory Visit: Payer: BLUE CROSS/BLUE SHIELD | Admitting: Occupational Therapy

## 2016-01-27 DIAGNOSIS — R2689 Other abnormalities of gait and mobility: Secondary | ICD-10-CM

## 2016-01-27 DIAGNOSIS — R2681 Unsteadiness on feet: Secondary | ICD-10-CM

## 2016-01-27 DIAGNOSIS — R41844 Frontal lobe and executive function deficit: Secondary | ICD-10-CM

## 2016-01-27 DIAGNOSIS — R42 Dizziness and giddiness: Secondary | ICD-10-CM

## 2016-01-27 NOTE — Telephone Encounter (Signed)
Ov next few days please 

## 2016-01-27 NOTE — Patient Instructions (Signed)
Gaze Stabilization: Standing (have a chair or walker in front of you)    Wear your glasses for this exercise. While keeping eyes on target on wall 3-5 feet away, tilt head down 15-30 and move head side to side for __45__ seconds.  You can also increase difficulty by moving your head faster.  Since turning side to side is somewhat easier, start making this one faster, but with the up/down head movements, keep speed same but increase time to 45 seconds.   Do __2-3__ sessions per day.   Gaze Stabilization: Tip Card  1.Target must remain in focus, not blurry, and appear stationary while head is in motion. 2.Perform exercises with small head movements (45 to either side of midline). 3.Increase speed of head motion so long as target is in focus. 4.If you wear eyeglasses, be sure you can see target through lens (therapist will give specific instructions for bifocal / progressive lenses). 5.These exercises may provoke dizziness or nausea. Work through these symptoms. If too dizzy, slow head movement slightly. Rest between each exercise. 6.Exercises demand concentration; avoid distractions.  Carpeted Surface With Diagonal Head Motion    Do in kitchen along your countertop.  Move head and eyes up and down while walking forwards then backwards x 2 reps. Then, move head and eyes side to side while walking forwards and backwards x 2 reps.   Do __2__ sessions per day.  Copyright  VHI. All rights reserved.   7.For safety, perform standing exercises close to a counter, wall, corner, or next to someone.

## 2016-01-27 NOTE — Telephone Encounter (Signed)
I contacted the Kelly Wilson and advised [name] would like for her to come in for an appt. Kelly Wilson scheduled her appt for 01/29/2016.

## 2016-01-27 NOTE — Telephone Encounter (Signed)
[employer] Call: Caller states urinating very little, had brain surgery recently, got out of hospital on 01/15/16

## 2016-01-27 NOTE — Therapy (Signed)
[facility] [address] [place], [name], [address] Phone: [phone]   Fax:  [phone]  Physical Therapy Treatment  Patient Details  Name: Kelly Wilson MRN: [id number] Date of Birth: 1955/09/12 Referring Provider: Tito Dine, MD  Encounter Date: 01/27/2016      PT End of Session - 01/27/16 1544    Visit Number 2   Number of Visits 13  eval + 12 visits   Date for PT Re-Evaluation 03/22/16   Authorization Type [employer] - 30 visit limit (PT, OT combined)   Authorization - Visit Number 1   Authorization - Number of Visits 30   PT Start Time [id number]   PT Stop Time 1533   PT Time Calculation (min) 45 min   Equipment Utilized During Treatment Gait belt   Activity Tolerance Patient tolerated treatment well   Behavior During Therapy [facility] for tasks assessed/performed      Past Medical History  Diagnosis Date  . Hyperlipidemia   . Hypertension   . 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 HYPERLIPIDEMIA 05/17/2007  . HYPERTENSION 05/17/2007  . GERD 08/09/2008  . OSTEOARTHRITIS 05/17/2007  . SPINAL STENOSIS 05/17/2007  . TRANSIENT ISCHEMIC ATTACK, HX OF 05/17/2007  . Left ear hearing loss     Mild    Past Surgical History  Procedure Laterality Date  . Abdominal hysterectomy  1995  . Tubal ligation    . External ear surgery      left side x's 2  . Stapedectomy    . Throidectomy    . Orif elbow fracture  02/2011  . Skin graft full thickness arm    . Skin graft full thickness leg    . Cesarean section  1993  . Ankle surgery  2005  . Thyroidectomy, partial Right 1980  . Knee surgery  02/2011  . Craniotomy Right 12/17/2015    Procedure: Suboccipital CRANIectomy INTRACRANIAL ANEURYSM FOR VERTEBRAL/BASILAR;  Surgeon: Consuella Lose, MD;  Location: [facility];  Service: Neurosurgery;  Laterality: Right;  suboccipital    There were no vitals filed for this visit.      Subjective Assessment - 01/27/16 1456     Subjective "I'll do anything to help me get better."    Pertinent History Likes to be called "[name].   PMH significant for: HTN, TIA   Patient Stated Goals "I want to get rid of that rolling walker; and to be able to walk around my house without feeling like I have to hold onto something."   Currently in Pain? No/denies                         [facility] Adult PT Treatment/Exercise - 01/27/16 1503    Standardized Balance Assessment   Standardized Balance Assessment Berg Balance Test   Berg Balance Test   Sit to Stand Able to stand without using hands and stabilize independently   Standing Unsupported Able to stand safely 2 minutes   Sitting with Back Unsupported but Feet Supported on Floor or Stool Able to sit safely and securely 2 minutes   Stand to Sit Sits safely with minimal use of hands   Transfers Able to transfer safely, minor use of hands   Standing Unsupported with Eyes Closed Able to stand 10 seconds with supervision   Standing Ubsupported with Feet Together Able to place feet together independently and stand for 1 minute with supervision   From Standing, Reach Forward with Outstretched Arm Can  reach forward >12 cm safely (5")   From Standing Position, Pick up Object from [place] to pick up shoe, needs supervision   From Standing Position, Turn to Look Behind Over each Shoulder Looks behind from both sides and weight shifts well   Turn 360 Degrees Able to turn 360 degrees safely but slowly   Standing Unsupported, Alternately Place Feet on Step/Stool Able to stand independently and complete 8 steps >20 seconds   Standing Unsupported, One Foot in Front Able to plae foot ahead of the other independently and hold 30 seconds   Standing on One Leg Able to lift leg independently and hold equal to or more than 3 seconds   Total Score 46       NMR:  Performed BERG balance test, see results above.  Discussed and went over with pt during session and note that she has  improved score by 13 points from inpatient rehab.  Pt very pleased with progress.  Continue to address vestibular deficits during session by going over currently HEP.  Had pt perform in standing to increase difficulty.  She was able to complete vertical and horizontal VOR exercises with min cues for correct technique.  Note that horizontal is easier than vertical.  Educated on how to increase speed of movement with horizontal head movements for increased challenge.  Recommend that she maintain speed with vertical head turns but to increase time to 45 seconds (as long as dizziness did not increase over 4/10) to increase endurance of VOR.  Made modifications on HEP, see pt instruction for details.  Also added walking along countertop forwards/backwards with head turns to address motion sensitivity along with vestibular deficits.  Pt tolerated well, did note increased dizziness with both directions when walking backwards.           PT Education - 01/27/16 1543    Education provided Yes   Education Details BERG results, modifications/additions to HEP.     Person(s) Educated Patient   Methods Explanation;Demonstration;Handout   Comprehension Verbalized understanding;Returned demonstration          PT Short Term Goals - 01/27/16 1550    PT SHORT TERM GOAL #1   Title Pt will perform initial HEP with mod I using handout to maximize functional gains made in PT.  (Target date: 02/19/16)   PT SHORT TERM GOAL #2   Title Pt will improve gait velocity (without AD) from 1.89 ft/sec to >/= 2.19 ft/sec to indicate increased efficiency of mobility. (02/19/16)   PT SHORT TERM GOAL #3   Title Pt will improve DGI from 7/24 to >/= 11/24 to indicate improved dynamic gait stability.  (02/19/16)   PT SHORT TERM GOAL #4   Title Complete Berg and improve score by 4 points from baseline to progress toward significant improvement in standing balance. (02/19/16)   Baseline 46/56 on 01/27/16   PT SHORT TERM GOAL #5   Title Pt  will ambulate 55' over level, indoor surfaces without AD with no overt LOB to indicate progress toward independence with household mobility. (02/19/16)   PT SHORT TERM GOAL #6   Title Pt will ambulate 200' over unlevel, paved surfaces with mod I using LRAD to progress toward safety with commnuity mobility. (02/19/16)   PT SHORT TERM GOAL #7   Title Pt will negotiate 2 stairs without rails with mod I using LRAD to indicate increased safety using primary home entrance.  (02/19/16)           PT Long Term  Goals - 01/22/16 1312    PT LONG TERM GOAL #1   Title Pt will improve Berg score by 8 points from baseline to indicate significant improvement in dynamic standing balance.  (03/18/16)   PT LONG TERM GOAL #2   Title Pt will improve gait velocity (without AD) from 1.89 ft/sec to >/= 2.49 ft/sec to indicate significantly increased efficiency of ambulation. (03/18/16)   PT LONG TERM GOAL #3   Title Pt will improve DGI from 7/24 to >/= 15/24 to indicate significant improvement in dynamic gait stability, progress toward decreased fall risk.  (03/18/16)   PT LONG TERM GOAL #4   Title Pt will independently ambulate 200' over level, indoor surfaces with effective obstacle negotiation to indicate independence with household ambulaton. (03/18/16)   PT LONG TERM GOAL #5   Title Pt will ambulate 500' over unlevel, paved surfaces with mod I using LRAD to indicate increased safety with community mobility.  (03/18/16)               Plan - 01/27/16 1546    Clinical Impression Statement Skilled session focused on assessment of balance via BERG balance test.  Note score of 46/56, still indicative of fall risk and recommendations for use of AD, however feel that she will progress very quickly to [facility] Fall.  Also worked on vestibular based exercises, building on what she currently is doing at home with addition of single exercise, see pt instruction for details.     Rehab Potential Good   Clinical Impairments Affecting  Rehab Potential Positive: age, motivation, supportive husband.  Negative: financial limitation (30 visit limit combined between OT/PT disciplines)   PT Frequency Other (comment)  2x/week for 4 weeks followed by 1x/week for 4 weeks   PT Duration Other (comment)  See above.   PT Treatment/Interventions ADLs/Self Care Home Management;Vestibular;DME Instruction;Gait training;Neuromuscular re-education;Stair training;Functional mobility training;Cognitive remediation;Therapeutic activities;Patient/family education;Therapeutic exercise;Orthotic Fit/Training;Balance training;Manual techniques;Visual/perceptual remediation/compensation   PT Next Visit Plan Continue to expand on HEP for balance and motion sensitivity    PT Home Exercise Plan 7/5: initated seated gaze stabilization HEP, made modifications/additions on 01/27/16   Consulted and Agree with Plan of Care Patient      Patient will benefit from skilled therapeutic intervention in order to improve the following deficits and impairments:  Abnormal gait, Decreased cognition, Decreased balance, Decreased strength, Impaired vision/preception, Dizziness  Visit Diagnosis: Other abnormalities of gait and mobility  Dizziness and giddiness  Unsteadiness on feet     Problem List Patient Active Problem List   Diagnosis Date Noted  . Leukopenia   . Bradycardia   . Acute lower UTI   . Agitation   . Lethargic   . Lethargy   . Bilateral headaches   . Other vascular headache   . Hypokalemia   . Heat rash   . Poor appetite   . Hyponatremia   . Cephalalgia   . Obstructive hydrocephalus 12/17/2015  . 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 Headache 12/17/2015  . IVH (intraventricular hemorrhage) ([place]) 12/16/2015  . Chest pain 05/10/2015  . Precordial chest pain 05/10/2015  . Chest wall pain 05/10/2015  . Pain in the chest   . Wellness examination 01/24/2015  . Myalgia 11/14/2014  . Muscle cramps 11/14/2014  .  Pinched nerve 08/28/2014  . Routine general medical examination at a health care facility 08/12/2013  . Cough 04/21/2012  . Lymphadenitis, acute 10/23/2011  . GERD 08/09/2008  . OTHER DYSPHAGIA 06/18/2008  . 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  NECK DISORDER 05/22/2008  . HEADACHE 08/16/2007  . OTITIS MEDIA, ACUTE, LEFT 08/11/2007  . OTHER ABNORMAL BLOOD CHEMISTRY 07/25/2007  . HYPERLIPIDEMIA 05/17/2007  . Essential hypertension 05/17/2007  . OSTEOARTHRITIS 05/17/2007  . SPINAL STENOSIS 05/17/2007  . History of cardiovascular disorder 05/17/2007    Cameron Sprang, PT, MPT [facility] [address] [place], [name], [address] Phone: [phone]   Fax:  [phone] 01/27/2016, 3:51 PM  Name: NIKOLLE TANKS MRN: [id number] Date of Birth: 10-13-1955

## 2016-01-27 NOTE — Therapy (Signed)
[facility] [address] [place], [name], [address] Phone: [phone]   Fax:  [phone]  Occupational Therapy Evaluation  Patient Details  Name: Kelly Wilson MRN: [id number] Date of Birth: [birth year] Referring Provider: Dr. Jamse Arn  Encounter Date: 01/27/2016      OT End of Session - 01/27/16 1550    Visit Number 1   Number of Visits 1   Date for OT Re-Evaluation --  n/a   Authorization Type BCBS; 30 visit combined for PT/OT and 30 viistis for ST   Authorization Time Period n/a   OT Start Time 1315   OT Stop Time 1359   OT Time Calculation (min) 44 min   Activity Tolerance Patient tolerated treatment well      Past Medical History  Diagnosis Date  . Hyperlipidemia   . Hypertension   . 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 HYPERLIPIDEMIA 05/17/2007  . HYPERTENSION 05/17/2007  . GERD 08/09/2008  . OSTEOARTHRITIS 05/17/2007  . SPINAL STENOSIS 05/17/2007  . TRANSIENT ISCHEMIC ATTACK, HX OF 05/17/2007  . Left ear hearing loss     Mild    Past Surgical History  Procedure Laterality Date  . Abdominal hysterectomy  1995  . Tubal ligation    . External ear surgery      left side x's 2  . Stapedectomy    . Throidectomy    . Orif elbow fracture  02/2011  . Skin graft full thickness arm    . Skin graft full thickness leg    . Cesarean section  1993  . Ankle surgery  2005  . Thyroidectomy, partial Right 1980  . Knee surgery  02/2011  . Craniotomy Right 12/17/2015    Procedure: Suboccipital CRANIectomy INTRACRANIAL ANEURYSM FOR VERTEBRAL/BASILAR;  Surgeon: Consuella Lose, MD;  Location: [facility];  Service: Neurosurgery;  Laterality: Right;  suboccipital    There were no vitals filed for this visit.      Subjective Assessment - 01/27/16 1322    Subjective  I am feeling pretty good today   Pertinent History see epic; [place] s/p crani with aneurysm clipping   Currently in Pain? No/denies            [facility] OT Assessment - 01/27/16 0001    Assessment   Diagnosis ICH with aneurysm clipping   Referring Provider Dr. Jamse Arn   Onset Date 12/16/15   Prior Therapy inpt rehab PT, OT and ST.  Pt now starting in outpatient for PT, ST and OT   Precautions   Precautions Fall  no driving   Restrictions   Weight Bearing Restrictions No   Balance Screen   Has the patient fallen in the past 6 months No  Pt currently seeing PT   [place] expects to be discharged to: Private residence   Living Arrangements Spouse/significant other   Available Help at Discharge Available 24 hours/day  dtr and parents also assist   Type of [place] One level   Bathroom Shower/Tub Walk-in Shower   Bathroom Toilet Handicapped height   Additional Comments Pt has shower seat with back, grab bar in shower.  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   Leisure spending time with grandchildren   ADL   Eating/Feeding Independent   Grooming  Independent   Upper Body Bathing Modified independent   Lower Body Bathing Modified independent   Upper Body Dressing Independent   Lower Body Dressing Modified independent   Toilet Tranfer Modified independent   Toileting - Clothing Manipulation Modified independent   [place]   Tub/Shower Transfer Modified independent   IADL   Shopping Assistance for transportation   Light Housekeeping Performs light daily tasks such as dishwashing, bed making   Meal Prep Able to complete simple warm meal prep   Community Mobility Relies on family or friends for transportation   Medication Management Is responsible for taking medication in correct dosages at correct time   Mobility   Mobility Status Needs assist   Written Expression   Dominant Hand Right    Handwriting 100% legible  for name in cursive/reports handwriting looks the same   Vision - History   Baseline Vision Bifocals   Additional Comments Pt denies visual changes    Vision Assessment   Eye Alignment Impaired (comment)  very mildly   Ocular Range of Motion Within Functional Limits   Tracking/Visual Pursuits Able to track stimulus in all quads without difficulty   Saccades Within functional limits   Visual Fields No apparent deficits   Activity Tolerance   Activity Tolerance Tolerate 30+ min activity without fatigue   Cognition   Overall Cognitive Status Impaired/Different from baseline   Mini Mental State Exam  25/30 on MOCA   Attention Alternating   Memory Impaired  as well as working Multimedia programmer deficit;Decreased recall of new information;Decreased short term memory   Awareness Impaired   Awareness Impairment Intellectual impairment;Emergent impairment;Anticipatory impairment   Problem Solving Impaired   Sensation   Light Touch Appears Intact   Hot/Cold Appears Intact   Proprioception Appears Intact   Coordination   Gross Motor Movements are Fluid and Coordinated Yes   Fine Motor Movements are Fluid and Coordinated Yes   Finger Nose Finger Test WFL's   Tone   Assessment Location Right Upper Extremity;Left Upper Extremity   ROM / Strength   AROM / PROM / Strength AROM;Strength   AROM   Overall AROM  Within functional limits for tasks performed   Overall AROM Comments BUE's   Strength   Overall Strength Within functional limits for tasks performed   Overall Strength Comments BUE's   Strength Assessment Site Shoulder   Hand Function   Right Hand Gross Grasp Functional   Right Hand Grip (lbs) 55   Left Hand Gross Grasp Functional   Left Hand Grip (lbs) 50   RUE Tone   RUE Tone Within Functional Limits   LUE Tone   LUE Tone Within Functional Limits                           OT Short Term Goals - 01/27/16 1543     OT SHORT TERM GOAL #1   Title n/a           OT Long Term Goals - 01/27/16 1543    OT LONG TERM GOAL #1   Title n/a               Plan - 01/27/16 1543    Clinical Impression Statement Pt is a 60 year old female s/p crani from [place]  and aneurysm clipping and subsequent hydrocephalus on 12/16/2015. Pt hospitalized and then discharged from inpt rehab on 01/15/2016.  Pt with PMH  of:  HTN, HLD, TIA.  Pt presents today with the folloiwng deficits that appear to impact primarily return to work and IAD's:  impaired balance, impaired activity tolerance, impaired cognition (alternating attention, awareness/insight, problem solving, abstraction, short term memory, working memory).  Pt has lmiited benefits for outpatient rehab and feel pt would be best served to work with PT for balance and mobility issues and ST for cognitive issues given that pt is independent in basic ADL and simple IADL activities in the home.  Pt in agreement.  No further follow up OT recommended at this time.    Rehab Potential --  n/a   OT Frequency --  n/a   OT Treatment/Interventions --  n/a   Plan no further OT recommended. ST to address cognitive issues and PT to address balance and mobility issues.    Consulted and Agree with Plan of Care Patient      Patient will benefit from skilled therapeutic intervention in order to improve the following deficits and impairments:  Abnormal gait, Decreased activity tolerance, Decreased balance, Decreased cognition, Decreased safety awareness, Decreased mobility  Visit Diagnosis: Frontal lobe and executive function deficit  Unsteadiness on feet    Problem List Patient Active Problem List   Diagnosis Date Noted  . Leukopenia   . Bradycardia   . Acute lower UTI   . Agitation   . Lethargic   . Lethargy   . Bilateral headaches   . Other vascular headache   . Hypokalemia   . Heat rash   . Poor appetite   . Hyponatremia   . Cephalalgia   . Obstructive  hydrocephalus 12/17/2015  . 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[place]) 12/17/2015  . Headache 12/17/2015  . IVH (intraventricular hemorrhage) ([place]) 12/16/2015  . Chest pain 05/10/2015  . Precordial chest pain 05/10/2015  . Chest wall pain 05/10/2015  . Pain in the chest   . Wellness examination 01/24/2015  . Myalgia 11/14/2014  . Muscle cramps 11/14/2014  . Pinched nerve 08/28/2014  . Routine general medical examination at a health care facility 08/12/2013  . Cough 04/21/2012  . Lymphadenitis, acute 10/23/2011  . GERD 08/09/2008  . OTHER DYSPHAGIA 06/18/2008  . 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 NECK DISORDER 05/22/2008  . HEADACHE 08/16/2007  . OTITIS MEDIA, ACUTE, LEFT 08/11/2007  . OTHER ABNORMAL BLOOD CHEMISTRY 07/25/2007  . HYPERLIPIDEMIA 05/17/2007  . Essential hypertension 05/17/2007  . OSTEOARTHRITIS 05/17/2007  . SPINAL STENOSIS 05/17/2007  . History of cardiovascular disorder 05/17/2007    Quay Burow, OTR/L 01/27/2016, 3:52 PM  [facility] [address] [place], [name], [address] Phone: [phone]   Fax:  [phone]  Name: Kelly Wilson MRN: [id number] Date of Birth: 10-May-1956

## 2016-01-28 LAB — URINE CULTURE

## 2016-01-29 ENCOUNTER — Ambulatory Visit (INDEPENDENT_AMBULATORY_CARE_PROVIDER_SITE_OTHER): Payer: BLUE CROSS/BLUE SHIELD | Admitting: Endocrinology

## 2016-01-29 ENCOUNTER — Encounter: Payer: Self-pay | Admitting: Endocrinology

## 2016-01-29 VITALS — BP 112/80 | HR 83 | Temp 97.3°F | Ht 66.0 in | Wt 227.0 lb

## 2016-01-29 DIAGNOSIS — I10 Essential (primary) hypertension: Secondary | ICD-10-CM | POA: Diagnosis not present

## 2016-01-29 MED ORDER — TRIAMTERENE-HCTZ 37.5-25 MG PO TABS: 0.5000 | ORAL_TABLET | Freq: Every day | ORAL | Status: DC

## 2016-01-29 NOTE — Patient Instructions (Addendum)
Please stop taking the potassium, and reduce the triamterene/HCTZ to 1/2 pill per day Please come back for a regular physical appointment in 2-3 months.

## 2016-01-29 NOTE — Progress Notes (Signed)
Subjective:    Patient ID: Charlott Rakes, female    DOB: 1956/05/22, 60 y.o.   MRN: [id number]  HPI 1 month ago, pt was in the hospital for cerebral aneurysm.  She feels better, but she still has slight left sided weakness, and assoc unsteady gait.   Past Medical History  Diagnosis Date  . Hyperlipidemia   . Hypertension   . 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 HYPERLIPIDEMIA 05/17/2007  . HYPERTENSION 05/17/2007  . GERD 08/09/2008  . OSTEOARTHRITIS 05/17/2007  . SPINAL STENOSIS 05/17/2007  . TRANSIENT ISCHEMIC ATTACK, HX OF 05/17/2007  . Left ear hearing loss     Mild    Past Surgical History  Procedure Laterality Date  . Abdominal hysterectomy  1995  . Tubal ligation    . External ear surgery      left side x's 2  . Stapedectomy    . Throidectomy    . Orif elbow fracture  02/2011  . Skin graft full thickness arm    . Skin graft full thickness leg    . Cesarean section  1993  . Ankle surgery  2005  . Thyroidectomy, partial Right 1980  . Knee surgery  02/2011  . Craniotomy Right 12/17/2015    Procedure: Suboccipital CRANIectomy INTRACRANIAL ANEURYSM FOR VERTEBRAL/BASILAR;  Surgeon: Consuella Lose, MD;  Location: [facility];  Service: Neurosurgery;  Laterality: Right;  suboccipital    Social History   Social History  . Marital Status: Married    Spouse Name: N/A  . Number of Children: N/A  . Years of Education: N/A   Occupational History  . realtor    Social History Main Topics  . Smoking status: Never Smoker   . Smokeless tobacco: Never Used     Comment: Married, she is a Cabin crew  . Alcohol Use: No  . Drug Use: No  . Sexual Activity: Not on file   Other Topics Concern  . Not on file   Social History Narrative   Married, Cabin crew, 2 daughters   Caffeine use: tea daily    Current Outpatient Prescriptions on File Prior to Visit  Medication Sig Dispense Refill  . amantadine (SYMMETREL) 100 MG capsule Take 1 capsule (100 mg total) by mouth 2 (two)  times daily with breakfast and lunch. 60 capsule 1  . levETIRAcetam (KEPPRA) 500 MG tablet Take 1 tablet (500 mg total) by mouth 2 (two) times daily. 60 tablet 1  . magnesium gluconate (MAGONATE) 500 MG tablet Take 1 tablet (500 mg total) by mouth daily. 30 tablet 1  . omeprazole (PRILOSEC) 40 MG capsule Take 1 capsule (40 mg total) by mouth daily. 30 capsule 1  . sodium chloride 1 g tablet Take 2 tablets (2 g total) by mouth 2 (two) times daily with a meal. 60 tablet 0  . traMADol (ULTRAM) 50 MG tablet Take 1 tablet (50 mg total) by mouth every 6 (six) hours as needed (for moderate to severe pain). 60 tablet 0  . valproic acid (DEPAKENE) 250 MG capsule Take 2 capsules (500 mg total) by mouth daily. 30 capsule 1   No current facility-administered medications on file prior to visit.    Allergies  Allergen Reactions  . Morphine Hives  . Rosuvastatin Other (See Comments)    myalgia    Family History  Problem Relation Age of Onset  . Coronary artery disease Mother   . Diabetes Mother   . Coronary artery disease Father   . Diabetes Sister  2 sister with DM  . Cancer Other     uncle had rectal cancer  . Hypertension Other   . Hyperlipidemia Other     BP 112/80 mmHg  Pulse 83  Temp(Src) 97.3 F (36.3 C)  Ht 5\' 6"  (1.676 m)  Wt 227 lb (102.967 kg)  BMI 36.66 kg/m2  SpO2 98%  Review of Systems She has only minimal headache (takes tramadol twice per week).  She has lost weight.      Objective:   Physical Exam VITAL SIGNS:  See vs page GENERAL: no distress Ext: no edema. Gait: slow but steady with walker.   Lab Results  Component Value Date   CREATININE 0.82 01/26/2016   BUN 7 01/26/2016   NA 137 01/26/2016   K 3.8 01/26/2016   CL 107 01/26/2016   CO2 22 01/26/2016      Assessment & Plan:  Hypokalemia: on reduced diuretic rx, she won't need KCl HTN: overcontrolled ICH: clinically better: f/u with NS  Patient is advised the following: Patient Instructions    Please stop taking the potassium, and reduce the triamterene/HCTZ to 1/2 pill per day Please come back for a regular physical appointment in 2-3 months.   [name], MD

## 2016-01-30 ENCOUNTER — Ambulatory Visit: Payer: BLUE CROSS/BLUE SHIELD | Admitting: Rehabilitation

## 2016-01-30 ENCOUNTER — Encounter: Payer: Self-pay | Admitting: Rehabilitation

## 2016-01-30 DIAGNOSIS — R2689 Other abnormalities of gait and mobility: Secondary | ICD-10-CM

## 2016-01-30 DIAGNOSIS — R2681 Unsteadiness on feet: Secondary | ICD-10-CM

## 2016-01-30 DIAGNOSIS — R42 Dizziness and giddiness: Secondary | ICD-10-CM

## 2016-01-30 NOTE — Therapy (Signed)
[facility] [address] [place], [name], [address] Phone: [phone]   Fax:  [phone]  Physical Therapy Treatment  Patient Details  Name: Kelly Wilson MRN: [id number] Date of Birth: [birth year] Referring Provider: Tito Dine, MD  Encounter Date: 01/30/2016      PT End of Session - 01/30/16 1415    Visit Number 3   Number of Visits 13  eval + 12 visits   Date for PT Re-Evaluation 03/22/16   Authorization Type BCBS - 30 visit limit (PT, OT combined)   Authorization - Visit Number 2   Authorization - Number of Visits 30   PT Start Time [id number]  PT late from previous appt   PT Stop Time 1447   PT Time Calculation (min) 42 min   Equipment Utilized During Treatment Gait belt   Activity Tolerance Patient tolerated treatment well   Behavior During Therapy WFL for tasks assessed/performed      Past Medical History  Diagnosis Date  . Hyperlipidemia   . Hypertension   . 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 HYPERLIPIDEMIA 05/17/2007  . HYPERTENSION 05/17/2007  . GERD 08/09/2008  . OSTEOARTHRITIS 05/17/2007  . SPINAL STENOSIS 05/17/2007  . TRANSIENT ISCHEMIC ATTACK, HX OF 05/17/2007  . Left ear hearing loss     Mild    Past Surgical History  Procedure Laterality Date  . Abdominal hysterectomy  1995  . Tubal ligation    . External ear surgery      left side x's 2  . Stapedectomy    . Throidectomy    . Orif elbow fracture  02/2011  . Skin graft full thickness arm    . Skin graft full thickness leg    . Cesarean section  1993  . Ankle surgery  2005  . Thyroidectomy, partial Right 1980  . Knee surgery  02/2011  . Craniotomy Right 12/17/2015    Procedure: Suboccipital CRANIectomy INTRACRANIAL ANEURYSM FOR VERTEBRAL/BASILAR;  Surgeon: Consuella Lose, MD;  Location: [facility];  Service: Neurosurgery;  Laterality: Right;  suboccipital    There were no vitals filed for this visit.      Subjective  Assessment - 01/30/16 1414    Subjective "The walking backwards gets me every time."    Pertinent History Likes to be called "Kelly Wilson.   PMH significant for: HTN, TIA   Patient Stated Goals "I want to get rid of that rolling walker; and to be able to walk around my house without feeling like I have to hold onto something."   Currently in Pain? No/denies           Gait;  Addressed gait with SPC over varying indoor surfaces.  Pt able to ambulate at mod I level over indoor surfaces >300', therefore provided pt okay to ambulate in home only with [facility] but still with RW outdoors.  Pt verbalized understanding.  Also addressed negotiation of curb step and ramp with SPC.  Pt able to perform at S level with min cues for sequencing.  Will continue to address in future sessions.    NMR:  Continued to address VOR with mobility; gait forwards and backwards x 10' with target on wall while performing head turns side to side x 2 reps and up/down x 2 reps.  Pt with increased dizziness and decreased balance with backwards gait.  Progressed to gait while moving ball in circular motion while following with head and eyes.  Pt with slow gait speed and intermittent mild  LOB (pt needing min/guard to correct) during this task but no increase in dizziness (perfomred x 115').  Performed gait with vertical ball toss (to self) x 115' at min/guard level for intermittent mild LOB, again no increase in dizziness reported.  Ended session with small bout on mini trampoline.  Had pt move up/down with feet still in contact with trampoline.  No increase in dizziness.                        PT Education - 01/30/16 1414    Education provided Yes   Education Details Continued education on vestibular deficits.     Person(s) Educated Patient   Methods Explanation   Comprehension Verbalized understanding          PT Short Term Goals - 01/27/16 1550    PT SHORT TERM GOAL #1   Title Pt will perform initial HEP with  mod I using handout to maximize functional gains made in PT.  (Target date: 02/19/16)   PT SHORT TERM GOAL #2   Title Pt will improve gait velocity (without AD) from 1.89 ft/sec to >/= 2.19 ft/sec to indicate increased efficiency of mobility. (02/19/16)   PT SHORT TERM GOAL #3   Title Pt will improve DGI from 7/24 to >/= 11/24 to indicate improved dynamic gait stability.  (02/19/16)   PT SHORT TERM GOAL #4   Title Complete Berg and improve score by 4 points from baseline to progress toward significant improvement in standing balance. (02/19/16)   Baseline 46/56 on 01/27/16   PT SHORT TERM GOAL #5   Title Pt will ambulate 13' over level, indoor surfaces without AD with no overt LOB to indicate progress toward independence with household mobility. (02/19/16)   PT SHORT TERM GOAL #6   Title Pt will ambulate 200' over unlevel, paved surfaces with mod I using LRAD to progress toward safety with commnuity mobility. (02/19/16)   PT SHORT TERM GOAL #7   Title Pt will negotiate 2 stairs without rails with mod I using LRAD to indicate increased safety using primary home entrance.  (02/19/16)           PT Long Term Goals - 01/22/16 1312    PT LONG TERM GOAL #1   Title Pt will improve Berg score by 8 points from baseline to indicate significant improvement in dynamic standing balance.  (03/18/16)   PT LONG TERM GOAL #2   Title Pt will improve gait velocity (without AD) from 1.89 ft/sec to >/= 2.49 ft/sec to indicate significantly increased efficiency of ambulation. (03/18/16)   PT LONG TERM GOAL #3   Title Pt will improve DGI from 7/24 to >/= 15/24 to indicate significant improvement in dynamic gait stability, progress toward decreased fall risk.  (03/18/16)   PT LONG TERM GOAL #4   Title Pt will independently ambulate 200' over level, indoor surfaces with effective obstacle negotiation to indicate independence with household ambulaton. (03/18/16)   PT LONG TERM GOAL #5   Title Pt will ambulate 500' over unlevel,  paved surfaces with mod I using LRAD to indicate increased safety with community mobility.  (03/18/16)               Plan - 01/30/16 1914    Clinical Impression Statement Skilled session focused on gait training with SPC to increase independence with ambulation.  Also continued to address high level vestibular deficits during session.  Tolerated very well during session with limitations with VOR during  backwards gait.     Rehab Potential Good   Clinical Impairments Affecting Rehab Potential Positive: age, motivation, supportive husband.  Negative: financial limitation (30 visit limit combined between OT/PT disciplines)   PT Frequency Other (comment)  2x/week for 4 weeks followed by 1x/week for 4 weeks   PT Duration Other (comment)  See above.   PT Treatment/Interventions ADLs/Self Care Home Management;Vestibular;DME Instruction;Gait training;Neuromuscular re-education;Stair training;Functional mobility training;Cognitive remediation;Therapeutic activities;Patient/family education;Therapeutic exercise;Orthotic Fit/Training;Balance training;Manual techniques;Visual/perceptual remediation/compensation   PT Next Visit Plan Continue to expand on HEP for balance and motion sensitivity, Benjie Karvonen she may be good for an SOT   PT Home Exercise Plan 7/5: initated seated gaze stabilization HEP, made modifications/additions on 01/27/16   Consulted and Agree with Plan of Care Patient      Patient will benefit from skilled therapeutic intervention in order to improve the following deficits and impairments:  Abnormal gait, Decreased cognition, Decreased balance, Decreased strength, Impaired vision/preception, Dizziness  Visit Diagnosis: Other abnormalities of gait and mobility  Dizziness and giddiness  Unsteadiness on feet     Problem List Patient Active Problem List   Diagnosis Date Noted  . Leukopenia   . Bradycardia   . Acute lower UTI   . Agitation   . Lethargic   . Lethargy   .  Bilateral headaches   . Other vascular headache   . Hypokalemia   . Heat rash   . Poor appetite   . Hyponatremia   . Cephalalgia   . Obstructive hydrocephalus 12/17/2015  . 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[place]) 12/17/2015  . Headache 12/17/2015  . IVH (intraventricular hemorrhage) ([place]) 12/16/2015  . Chest pain 05/10/2015  . Precordial chest pain 05/10/2015  . Chest wall pain 05/10/2015  . Pain in the chest   . Wellness examination 01/24/2015  . Myalgia 11/14/2014  . Muscle cramps 11/14/2014  . Pinched nerve 08/28/2014  . Routine general medical examination at a health care facility 08/12/2013  . Cough 04/21/2012  . Lymphadenitis, acute 10/23/2011  . GERD 08/09/2008  . OTHER DYSPHAGIA 06/18/2008  . 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 NECK DISORDER 05/22/2008  . HEADACHE 08/16/2007  . OTITIS MEDIA, ACUTE, LEFT 08/11/2007  . OTHER ABNORMAL BLOOD CHEMISTRY 07/25/2007  . HYPERLIPIDEMIA 05/17/2007  . Essential hypertension 05/17/2007  . OSTEOARTHRITIS 05/17/2007  . SPINAL STENOSIS 05/17/2007  . History of cardiovascular disorder 05/17/2007    Cameron Sprang, PT, MPT [facility] [address] [place], [name], [address] Phone: [phone]   Fax:  [phone] 01/30/2016, 7:16 PM  Name: AEDYN LYCETT MRN: [id number] Date of Birth: 05-09-1956

## 2016-02-03 ENCOUNTER — Encounter: Payer: Self-pay | Admitting: Rehabilitation

## 2016-02-03 ENCOUNTER — Ambulatory Visit: Payer: BLUE CROSS/BLUE SHIELD | Admitting: Rehabilitation

## 2016-02-03 ENCOUNTER — Ambulatory Visit: Payer: BLUE CROSS/BLUE SHIELD

## 2016-02-03 DIAGNOSIS — R2689 Other abnormalities of gait and mobility: Secondary | ICD-10-CM

## 2016-02-03 DIAGNOSIS — R41841 Cognitive communication deficit: Secondary | ICD-10-CM

## 2016-02-03 DIAGNOSIS — R488 Other symbolic dysfunctions: Secondary | ICD-10-CM

## 2016-02-03 DIAGNOSIS — R2681 Unsteadiness on feet: Secondary | ICD-10-CM

## 2016-02-03 NOTE — Therapy (Signed)
[facility] [address] [place], [name], [address] Phone: [phone]   Fax:  [phone]  Physical Therapy Treatment  Patient Details  Name: Kelly Wilson MRN: [id number] Date of Birth: 11/29/1955 Referring Provider: Tito Dine, MD  Encounter Date: 02/03/2016      PT End of Session - 02/03/16 1019    Visit Number 4   Number of Visits 13  eval + 12 visits   Date for PT Re-Evaluation 03/22/16   Authorization Type [employer] - 30 visit limit (PT, OT combined)   Authorization - Visit Number 3   Authorization - Number of Visits 30   PT Start Time 1016   PT Stop Time 1100   PT Time Calculation (min) 44 min   Equipment Utilized During Treatment Gait belt   Activity Tolerance Patient tolerated treatment well   Behavior During Therapy WFL for tasks assessed/performed      Past Medical History  Diagnosis Date  . Hyperlipidemia   . Hypertension   . 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 HYPERLIPIDEMIA 05/17/2007  . HYPERTENSION 05/17/2007  . GERD 08/09/2008  . OSTEOARTHRITIS 05/17/2007  . SPINAL STENOSIS 05/17/2007  . TRANSIENT ISCHEMIC ATTACK, HX OF 05/17/2007  . Left ear hearing loss     Mild    Past Surgical History  Procedure Laterality Date  . Abdominal hysterectomy  1995  . Tubal ligation    . External ear surgery      left side x's 2  . Stapedectomy    . Throidectomy    . Orif elbow fracture  02/2011  . Skin graft full thickness arm    . Skin graft full thickness leg    . Cesarean section  1993  . Ankle surgery  2005  . Thyroidectomy, partial Right 1980  . Knee surgery  02/2011  . Craniotomy Right 12/17/2015    Procedure: Suboccipital CRANIectomy INTRACRANIAL ANEURYSM FOR VERTEBRAL/BASILAR;  Surgeon: Consuella Lose, MD;  Location: [facility];  Service: Neurosurgery;  Laterality: Right;  suboccipital    There were no vitals filed for this visit.      Subjective Assessment - 02/03/16 1018     Subjective reports no changes, no falls.  Note pt ambulatory to clinic today with [name].  Education to allow practice before using in community.     Pertinent History Likes to be called "Kelly Wilson.   PMH significant for: HTN, TIA   Patient Stated Goals "I want to get rid of that rolling walker; and to be able to walk around my house without feeling like I have to hold onto something."   Currently in Pain? No/denies           Gait:  Addressed gait over outdoor surfaces with use of SPC.  Pt able to ambulate >400' in grass, paved surfaces, up and down small inclines as well as traverse over parking cement barrier with SPC.  Pt able to ambulate at S level with min cues for sequencing, however no overt LOB.  Recommended pt begin this at home with S initially, but would work on progressing to mod I level.     NMR:  Neuro re-ed: sensory organization test performed with following results: Conditions: 1:  Normal, 2 below normal 2:  2 normal, 1 abnormal 3:  1 abnormal, 2 normal 4: 3 abnormal 5: 3 falls 6: 2 falls, 1 abnormal Composite score:  46 Sensory Analysis Som: Normal Vis: slightly abnormal Vest: zero Strategy analysis:  Increased use of  ankle strategy     COG alignment:     Normal  Provided two balance exercises based on SOT results, see pt instruction.    Self Care:  Discussed walking program with pt, see pt instruction and also went over SOT results.                       PT Education - 02/03/16 1019    Education provided Yes   Education Details Education on using SPC outdoors with S from family also educated on SOT results.     Person(s) Educated Patient   Methods Explanation   Comprehension Verbalized understanding          PT Short Term Goals - 01/27/16 1550    PT SHORT TERM GOAL #1   Title Pt will perform initial HEP with mod I using handout to maximize functional gains made in PT.  (Target date: 02/19/16)   PT SHORT TERM GOAL #2   Title Pt will  improve gait velocity (without AD) from 1.89 ft/sec to >/= 2.19 ft/sec to indicate increased efficiency of mobility. (02/19/16)   PT SHORT TERM GOAL #3   Title Pt will improve DGI from 7/24 to >/= 11/24 to indicate improved dynamic gait stability.  (02/19/16)   PT SHORT TERM GOAL #4   Title Complete Berg and improve score by 4 points from baseline to progress toward significant improvement in standing balance. (02/19/16)   Baseline 46/56 on 01/27/16   PT SHORT TERM GOAL #5   Title Pt will ambulate 1' over level, indoor surfaces without AD with no overt LOB to indicate progress toward independence with household mobility. (02/19/16)   PT SHORT TERM GOAL #6   Title Pt will ambulate 200' over unlevel, paved surfaces with mod I using LRAD to progress toward safety with commnuity mobility. (02/19/16)   PT SHORT TERM GOAL #7   Title Pt will negotiate 2 stairs without rails with mod I using LRAD to indicate increased safety using primary home entrance.  (02/19/16)           PT Long Term Goals - 01/22/16 1312    PT LONG TERM GOAL #1   Title Pt will improve Berg score by 8 points from baseline to indicate significant improvement in dynamic standing balance.  (03/18/16)   PT LONG TERM GOAL #2   Title Pt will improve gait velocity (without AD) from 1.89 ft/sec to >/= 2.49 ft/sec to indicate significantly increased efficiency of ambulation. (03/18/16)   PT LONG TERM GOAL #3   Title Pt will improve DGI from 7/24 to >/= 15/24 to indicate significant improvement in dynamic gait stability, progress toward decreased fall risk.  (03/18/16)   PT LONG TERM GOAL #4   Title Pt will independently ambulate 200' over level, indoor surfaces with effective obstacle negotiation to indicate independence with household ambulaton. (03/18/16)   PT LONG TERM GOAL #5   Title Pt will ambulate 500' over unlevel, paved surfaces with mod I using LRAD to indicate increased safety with community mobility.  (03/18/16)                Plan - 02/03/16 1313    Clinical Impression Statement Skilled session focused on gait outdoors with [facility].  Pt able to ambulate over varying outdoor surfaces at S level, therefore provided clearance for pt to do so with S from family.  Pt verbalized understanding.  Also assessed specific balance deficits with SOT.  Note composite score of  68, well below age matched normal.     Rehab Potential Good   Clinical Impairments Affecting Rehab Potential Positive: age, motivation, supportive husband.  Negative: financial limitation (30 visit limit combined between OT/PT disciplines)   PT Frequency Other (comment)  2x/week for 4 weeks followed by 1x/week for 4 weeks   PT Duration Other (comment)  See above.   PT Treatment/Interventions ADLs/Self Care Home Management;Vestibular;DME Instruction;Gait training;Neuromuscular re-education;Stair training;Functional mobility training;Cognitive remediation;Therapeutic activities;Patient/family education;Therapeutic exercise;Orthotic Fit/Training;Balance training;Manual techniques;Visual/perceptual remediation/compensation   PT Next Visit Plan Continue to expand on HEP for balance and motion sensitivity, Benjie Karvonen she may be good for an SOT   PT Home Exercise Plan 7/5: initated seated gaze stabilization HEP, made modifications/additions on 01/27/16   Consulted and Agree with Plan of Care Patient      Patient will benefit from skilled therapeutic intervention in order to improve the following deficits and impairments:  Abnormal gait, Decreased cognition, Decreased balance, Decreased strength, Impaired vision/preception, Dizziness  Visit Diagnosis: Other abnormalities of gait and mobility  Unsteadiness on feet     Problem List Patient Active Problem List   Diagnosis Date Noted  . Leukopenia   . Bradycardia   . Acute lower UTI   . Agitation   . Lethargic   . Lethargy   . Bilateral headaches   . Other vascular headache   . Hypokalemia   . Heat rash   . Poor  appetite   . Hyponatremia   . Cephalalgia   . Obstructive hydrocephalus 12/17/2015  . 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[place]) 12/17/2015  . Headache 12/17/2015  . IVH (intraventricular hemorrhage) ([place]) 12/16/2015  . Chest pain 05/10/2015  . Precordial chest pain 05/10/2015  . Chest wall pain 05/10/2015  . Pain in the chest   . Wellness examination 01/24/2015  . Myalgia 11/14/2014  . Muscle cramps 11/14/2014  . Pinched nerve 08/28/2014  . Routine general medical examination at a health care facility 08/12/2013  . Cough 04/21/2012  . Lymphadenitis, acute 10/23/2011  . GERD 08/09/2008  . OTHER DYSPHAGIA 06/18/2008  . 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 NECK DISORDER 05/22/2008  . HEADACHE 08/16/2007  . OTITIS MEDIA, ACUTE, LEFT 08/11/2007  . OTHER ABNORMAL BLOOD CHEMISTRY 07/25/2007  . HYPERLIPIDEMIA 05/17/2007  . Essential hypertension 05/17/2007  . OSTEOARTHRITIS 05/17/2007  . SPINAL STENOSIS 05/17/2007  . History of cardiovascular disorder 05/17/2007   Cameron Sprang, PT, MPT [facility] [address] [place], [name], [address] Phone: [phone]   Fax:  [phone] 02/03/2016, 2:51 PM  Name: Kelly Wilson MRN: [id number] Date of Birth: 1956-01-01

## 2016-02-03 NOTE — Patient Instructions (Addendum)
Walking Program:   Set aside time of day for walking for exercise to build up your endurance.  Begin with 5 mins at a time (if you need to back down that is fine, but keep with that time for about a week).  Do this 2-3 times per day.  Can be inside or outside, but if you do outside, then make sure you are doing it either early in the morning or late to avoid excessive heat.  If you do 5 mins for several days and its easy,  Bump up by one minute, otherwise stay at 5 mins for one week.  Try to bump up a minute each week as you are able.    Feet Together, Arm Motion - Eyes Closed    With eyes closed and feet together, keep arms by your side.   Hold for 30 secs and do 3 times.   Do _2___ sessions per day.  Copyright  VHI. All rights reserved.   Feet Together, Head Motion - Eyes Closed    With eyes closed and feet together, move head slowly, side to side x 10 reps.  Repeat __2__ times per session. Do _2___ sessions per day.  Copyright  VHI. All rights reserved.

## 2016-02-03 NOTE — Therapy (Signed)
[facility] [address] [place], [name], [address] Phone: [phone]   Fax:  [phone]  Speech Language Pathology Evaluation  Patient Details  Name: Kelly Wilson MRN: [id number] Date of Birth: 09/06/1955 Referring Provider: Delice Lesch M.D.  Encounter Date: 02/03/2016      End of Session - 02/03/16 1351    Visit Number 1   Number of Visits 17   Date for SLP Re-Evaluation 04/17/16   Authorization - Visit Number 1   Authorization - Number of Visits 69   SLP Start Time 0851   SLP Stop Time  0931   SLP Time Calculation (min) 40 min   Activity Tolerance Patient tolerated treatment well      Past Medical History  Diagnosis Date  . Hyperlipidemia   . Hypertension   . 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 HYPERLIPIDEMIA 05/17/2007  . HYPERTENSION 05/17/2007  . GERD 08/09/2008  . OSTEOARTHRITIS 05/17/2007  . SPINAL STENOSIS 05/17/2007  . TRANSIENT ISCHEMIC ATTACK, HX OF 05/17/2007  . Left ear hearing loss     Mild    Past Surgical History  Procedure Laterality Date  . Abdominal hysterectomy  1995  . Tubal ligation    . External ear surgery      left side x's 2  . Stapedectomy    . Throidectomy    . Orif elbow fracture  02/2011  . Skin graft full thickness arm    . Skin graft full thickness leg    . Cesarean section  1993  . Ankle surgery  2005  . Thyroidectomy, partial Right 1980  . Knee surgery  02/2011  . Craniotomy Right 12/17/2015    Procedure: Suboccipital CRANIectomy INTRACRANIAL ANEURYSM FOR VERTEBRAL/BASILAR;  Surgeon: Consuella Lose, MD;  Location: [facility];  Service: Neurosurgery;  Laterality: Right;  suboccipital    There were no vitals filed for this visit.      Subjective Assessment - 02/03/16 0903    Subjective "I think I'm better now since I was here last week."   Currently in Pain? No/denies            SLP Evaluation [name] - 02/03/16 [id number]    SLP Visit Information    SLP Received On 02/03/16   Referring Provider Delice Lesch M.D.   Medical Diagnosis ICH with craniotomy/aneurysm clipping   General Information   HPI Pt with ICH requiring craniotomy and aneurysm clipping. Skilled ST at [facility] was recommended Pt reports she has paid bills, cooked herself hot sandwich, and re-ordered meds in the last week.   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 "They say I can't (drive)." (when asked why she can't drive by SLP)   Memory --  needs to be assessed more thoroughly   Awareness Impairment Intellectual impairment;Emergent impairment;Anticipatory impairment   Problem Solving Impaired   Problem Solving Impairment Functional basic  shown in novel task   Auditory Comprehension   Overall Auditory Comprehension Appears within functional limits for tasks assessed   Expression   Primary  Mode of Expression Verbal   Written Expression   Written Expression Exceptions to [facility]   Interfering Components Other (comment)  decr'd emergent awareness   Overall Writen Expression Pt made errors on cognitive linguistic testing without awareness   Oral Motor/Sensory Function   Overall Oral Motor/Sensory Function Appears within functional limits for tasks assessed   Motor Speech   Overall Motor Speech Appears within functional limits for tasks assessed   Standardized Assessments   Standardized Assessments  Self-Administered Gero-Cognitive Examination   Self-Administered Gero-Cognitive Examination  decr'd attention to detail, emergent  awareness, problem soving for novel information seen as areas to target for ST                         SLP Education - 02/03/16 1351    Education provided Yes   Education Details ST deficit areas in cognitive linguistics   Person(s) Educated Patient   Methods Explanation;Demonstration   Comprehension Verbalized understanding          SLP Short Term Goals - 02/03/16 1439    SLP SHORT TERM GOAL #1   Title pt will demo emergent awareness in mod complex cognitive linguistic tasks 85% of opportunities   Time 4   Period Weeks   Status New   SLP SHORT TERM GOAL #2   Title pt will demo attention to detail in mod complex cognitive linguistic tasks to obtain 95% accuracy with self correction allowed   Time 4   Period Weeks   Status New   SLP SHORT TERM GOAL #3   Title pt will demo problem solving/reasoning skills in mod complex cognitive linguistic tasks with rare min A   Time 4   Period Weeks   Status New   SLP SHORT TERM GOAL #4   Title In written tasks, pt will demo awareness of written errors with min cues, occasionally   Time 4   Period Weeks          SLP Long Term Goals - 02/03/16 1443    SLP LONG TERM GOAL #1   Title pt will demo anticipatory awareness (i.e., double-checking more detailed work) in four sessions   Time 8   Period Weeks   Status New   SLP LONG TERM GOAL #2   Title  pt will demo attention to detail by achieving 100% success with mod complex-complex cognitive linguistic tasks with compensation (double checking work) in four sessions   Time 8   Period Weeks   Status New   SLP LONG TERM GOAL #3   Title In mod complex cognitive linguistic tasks, pt will show divided attention 80% of the time, with 90% accuracy on both tasks over two sessions   Time 8   Period Weeks   Status New   SLP LONG TERM GOAL #4   Title in written tasks, pt will correct written aphasia errors independently   Time 8   Period Weeks   Status New           Plan - 02/03/16 1352    Clinical Impression Statement 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 Pt agreed upon deficit areas when confronted about them after testing, but did not offer any areas of difficulty when asked by SLP initially. This SLP's opinion is that pt is not appropriate to return to work at this time. 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.Marland Kitchen   Speech Therapy Frequency 2x / week   Duration --  8 weeks   Treatment/Interventions Cognitive reorganization;SLP instruction and feedback;Compensatory strategies;Internal/external aids;Patient/family education;Functional tasks;Cueing hierarchy   Potential to Achieve Goals Good   Potential Considerations --  awareness and attention levels   Consulted and Agree with Plan of Care Patient      Patient will benefit from skilled therapeutic intervention in order to improve the following deficits and impairments:   Cognitive communication deficit  Aphasic agraphia    Problem List Patient Active Problem List   Diagnosis Date Noted  . Leukopenia   . Bradycardia   . Acute lower UTI   . Agitation   . Lethargic   . Lethargy   . Bilateral headaches   . Other vascular headache   . Hypokalemia   . Heat rash   . Poor appetite   . Hyponatremia   . Cephalalgia   . Obstructive hydrocephalus 12/17/2015  . 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[place]) 12/17/2015  . Headache 12/17/2015  . IVH (intraventricular hemorrhage) ([place]) 12/16/2015  . Chest pain 05/10/2015  . Precordial chest pain 05/10/2015  . Chest wall pain 05/10/2015  . Pain in the chest   . Wellness examination 01/24/2015  . Myalgia 11/14/2014  . Muscle cramps 11/14/2014  . Pinched  nerve 08/28/2014  . Routine general medical examination at a health care facility 08/12/2013  . Cough 04/21/2012  . Lymphadenitis, acute 10/23/2011  . GERD 08/09/2008  . OTHER DYSPHAGIA 06/18/2008  . 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 NECK DISORDER 05/22/2008  . HEADACHE 08/16/2007  . OTITIS MEDIA, ACUTE, LEFT 08/11/2007  . OTHER ABNORMAL BLOOD CHEMISTRY 07/25/2007  . HYPERLIPIDEMIA 05/17/2007  . Essential hypertension 05/17/2007  . OSTEOARTHRITIS 05/17/2007  . SPINAL STENOSIS 05/17/2007  . History of cardiovascular disorder 05/17/2007    [facility] ,[place], [place]  02/03/2016, 3:06 PM  [facility] [address] [place], [name], [address] Phone: [phone]   Fax:  [phone]  Name: Kelly Wilson MRN: [id number] Date of Birth: 19-Oct-1955

## 2016-02-05 ENCOUNTER — Ambulatory Visit: Payer: BLUE CROSS/BLUE SHIELD | Admitting: Physical Therapy

## 2016-02-05 DIAGNOSIS — R2689 Other abnormalities of gait and mobility: Secondary | ICD-10-CM | POA: Diagnosis not present

## 2016-02-05 DIAGNOSIS — R2681 Unsteadiness on feet: Secondary | ICD-10-CM

## 2016-02-05 NOTE — Therapy (Signed)
[facility] [address] [place], [name], [address] Phone: [phone]   Fax:  [phone]  Physical Therapy Treatment  Patient Details  Name: Kelly Wilson MRN: [id number] Date of Birth: [birth year] Referring Provider: Tito Dine, MD  Encounter Date: 02/05/2016      PT End of Session - 02/05/16 1952    Visit Number 5   Number of Visits 13  eval + 12 visits   Date for PT Re-Evaluation 03/22/16   Authorization Type [employer] - 30 visit limit (PT, OT combined)   Authorization - Visit Number 4   Authorization - Number of Visits 30   PT Start Time 1534   PT Stop Time 1620   PT Time Calculation (min) 46 min   Equipment Utilized During Treatment Gait belt   Activity Tolerance Patient tolerated treatment well   Behavior During Therapy WFL for tasks assessed/performed      Past Medical History  Diagnosis Date  . Hyperlipidemia   . Hypertension   . 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 HYPERLIPIDEMIA 05/17/2007  . HYPERTENSION 05/17/2007  . GERD 08/09/2008  . OSTEOARTHRITIS 05/17/2007  . SPINAL STENOSIS 05/17/2007  . TRANSIENT ISCHEMIC ATTACK, HX OF 05/17/2007  . Left ear hearing loss     Mild    Past Surgical History  Procedure Laterality Date  . Abdominal hysterectomy  1995  . Tubal ligation    . External ear surgery      left side x's 2  . Stapedectomy    . Throidectomy    . Orif elbow fracture  02/2011  . Skin graft full thickness arm    . Skin graft full thickness leg    . Cesarean section  1993  . Ankle surgery  2005  . Thyroidectomy, partial Right 1980  . Knee surgery  02/2011  . Craniotomy Right 12/17/2015    Procedure: Suboccipital CRANIectomy INTRACRANIAL ANEURYSM FOR VERTEBRAL/BASILAR;  Surgeon: Consuella Lose, MD;  Location: [facility];  Service: Neurosurgery;  Laterality: Right;  suboccipital    There were no vitals filed for this visit.      Subjective Assessment - 02/05/16 1534     Subjective "I always feel like I'm losing my balance backwards. Like when I'm getting ready at the sink in the morning, I end up taking steps backwards to regain balance." Pt reports no changes, no falls. Pt arrived to this session using SPC.    Pertinent History Likes to be called "[name]".    PMH significant for: HTN, TIA   Patient Stated Goals "I want to get rid of that rolling walker; and to be able to walk around my house without feeling like I have to hold onto something."   Currently in Pain? No/denies                          Vestibular Treatment/Exercise - 02/05/16 0001    Vestibular Treatment/Exercise   Vestibular Treatment Provided Gaze   Gaze Exercises X1 Viewing Horizontal;X1 Viewing Vertical   X1 Viewing Horizontal   Foot Position standing; feet shoulder width apart   Reps 2   Comments 2 x30-sec trials with cueing to decrease amplitude of head movement, increase speed (so long as visual target in focus) with effective return demo.   X1 Viewing Vertical   Comments --  Deferred due to progressive lenses.            Balance Exercises - 02/05/16 1946  Balance Exercises: Standing   Standing Eyes Opened Narrow base of support (BOS);Foam/compliant surface;Other reps (comment)  1 pillow; horiz, vertical head turns x10 with no difficulty   Standing Eyes Closed Wide (BOA);Narrow base of support (BOS);30 secs;Other reps (comment);Head turns;Foam/compliant surface  static, narrow BOS x30 sec; wide BOS, horiz, vert turns x10   Wall Bumps Hip   Wall Bumps-Hips Eyes opened;Eyes closed;Anterior/posterior;20 reps;Other (comment)  EC then EC x10 reps each   Balance Beam At // bars, performed A/P stepping onto/off of blue foam beam x10 reps per LE. While standing on blue foam beam, performed head turns vertical 2 x5 reps with min guard-min A, horizontal x5 reps with (S).   Gait with Head Turns Forward;Retro;Upper extremity support;4 reps  significant LOB w/ retro  gait w/ head turns; HEP modified   Retro Gait Head turns;2 reps;Other (comment)  2x30' searching for cards in hallway with min guard           PT Education - 02/05/16 1943    Education provided Yes   Education Details HEP: reviewed, progressed, consolidated; see Pt Instructions for details.    Person(s) Educated Patient   Methods Explanation;Demonstration;Handout;Verbal cues   Comprehension Verbalized understanding;Returned demonstration          PT Short Term Goals - 01/27/16 1550    PT SHORT TERM GOAL #1   Title Pt will perform initial HEP with mod I using handout to maximize functional gains made in PT.  (Target date: 02/19/16)   PT SHORT TERM GOAL #2   Title Pt will improve gait velocity (without AD) from 1.89 ft/sec to >/= 2.19 ft/sec to indicate increased efficiency of mobility. (02/19/16)   PT SHORT TERM GOAL #3   Title Pt will improve DGI from 7/24 to >/= 11/24 to indicate improved dynamic gait stability.  (02/19/16)   PT SHORT TERM GOAL #4   Title Complete Berg and improve score by 4 points from baseline to progress toward significant improvement in standing balance. (02/19/16)   Baseline 46/56 on 01/27/16   PT SHORT TERM GOAL #5   Title Pt will ambulate 12' over level, indoor surfaces without AD with no overt LOB to indicate progress toward independence with household mobility. (02/19/16)   PT SHORT TERM GOAL #6   Title Pt will ambulate 200' over unlevel, paved surfaces with mod I using LRAD to progress toward safety with commnuity mobility. (02/19/16)   PT SHORT TERM GOAL #7   Title Pt will negotiate 2 stairs without rails with mod I using LRAD to indicate increased safety using primary home entrance.  (02/19/16)           PT Long Term Goals - 01/22/16 1312    PT LONG TERM GOAL #1   Title Pt will improve Berg score by 8 points from baseline to indicate significant improvement in dynamic standing balance.  (03/18/16)   PT LONG TERM GOAL #2   Title Pt will improve gait velocity  (without AD) from 1.89 ft/sec to >/= 2.49 ft/sec to indicate significantly increased efficiency of ambulation. (03/18/16)   PT LONG TERM GOAL #3   Title Pt will improve DGI from 7/24 to >/= 15/24 to indicate significant improvement in dynamic gait stability, progress toward decreased fall risk.  (03/18/16)   PT LONG TERM GOAL #4   Title Pt will independently ambulate 200' over level, indoor surfaces with effective obstacle negotiation to indicate independence with household ambulaton. (03/18/16)   PT LONG TERM GOAL #5   Title  Pt will ambulate 500' over unlevel, paved surfaces with mod I using LRAD to indicate increased safety with community mobility.  (03/18/16)               Plan - 02/05/16 1953    Clinical Impression Statement Pt no longer experiencing dizziness/disequilibrium; however, does exhibit LOB (with posterior preference) with vertical > horizontal head turns. Addressed hip and stepping strategies during this session.    Rehab Potential Good   Clinical Impairments Affecting Rehab Potential Positive: age, motivation, supportive husband.  Negative: financial limitation (30 visit limit combined between OT/PT disciplines)   PT Frequency Other (comment)  2x/week for 4 weeks followed by 1x/week for 4 weeks   PT Duration Other (comment)  See above.   PT Treatment/Interventions ADLs/Self Care Home Management;Vestibular;DME Instruction;Gait training;Neuromuscular re-education;Stair training;Functional mobility training;Cognitive remediation;Therapeutic activities;Patient/family education;Therapeutic exercise;Orthotic Fit/Training;Balance training;Manual techniques;Visual/perceptual remediation/compensation   PT Next Visit Plan Standing balance with vestib reliance; functional head turns on varying surfaces (ramp, one foot on step); 180 and 360-degree turns; consider corrective saccades exercise as compensatory strategy   PT Home Exercise Plan 7/19: progressed, consolidated all exercises  given in previous sessions   Consulted and Agree with Plan of Care Patient      Patient will benefit from skilled therapeutic intervention in order to improve the following deficits and impairments:  Abnormal gait, Decreased cognition, Decreased balance, Decreased strength, Impaired vision/preception, Dizziness  Visit Diagnosis: Other abnormalities of gait and mobility  Unsteadiness on feet     Problem List Patient Active Problem List   Diagnosis Date Noted  . Leukopenia   . Bradycardia   . Acute lower UTI   . Agitation   . Lethargic   . Lethargy   . Bilateral headaches   . Other vascular headache   . Hypokalemia   . Heat rash   . Poor appetite   . Hyponatremia   . Cephalalgia   . Obstructive hydrocephalus 12/17/2015  . 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[place]) 12/17/2015  . Headache 12/17/2015  . IVH (intraventricular hemorrhage) ([place]) 12/16/2015  . Chest pain 05/10/2015  . Precordial chest pain 05/10/2015  . Chest wall pain 05/10/2015  . Pain in the chest   . Wellness examination 01/24/2015  . Myalgia 11/14/2014  . Muscle cramps 11/14/2014  . Pinched nerve 08/28/2014  . Routine general medical examination at a health care facility 08/12/2013  . Cough 04/21/2012  . Lymphadenitis, acute 10/23/2011  . GERD 08/09/2008  . OTHER DYSPHAGIA 06/18/2008  . 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 NECK DISORDER 05/22/2008  . HEADACHE 08/16/2007  . OTITIS MEDIA, ACUTE, LEFT 08/11/2007  . OTHER ABNORMAL BLOOD CHEMISTRY 07/25/2007  . HYPERLIPIDEMIA 05/17/2007  . Essential hypertension 05/17/2007  . OSTEOARTHRITIS 05/17/2007  . SPINAL STENOSIS 05/17/2007  . History of cardiovascular disorder 05/17/2007    Kelly Wilson, PT, DPT [facility] [address] [place], [name], [address] Phone: [phone]   Fax:  [phone] 02/05/2016, 8:00 PM  Name: SERYNITY DOTTERWEICH MRN: [id number] Date of Birth: 1956/06/03

## 2016-02-05 NOTE — Patient Instructions (Addendum)
Walking Program:   Set aside time of day for walking for exercise to build up your endurance. Begin with 5 mins at a time (if you need to back down that is fine, but keep with that time for about a week). Do this 2-3 times per day. Can be inside or outside, but if you do outside, then make sure you are doing it either early in the morning or late to avoid excessive heat. If you do 5 mins for several days and its easy, Bump up by one minute, otherwise stay at 5 mins for one week. Try to bump up a minute each week as you are able.   Feet Apart (Compliant Surface) Head Motion - Eyes Closed    Stand with your back to a corner with stable chair in front of you. Stand on 1 pillow with feet shoulder width apart. Close eyes and move head slowly: up and down 10 times; right to left 10 times.   Repeat __2_ times per session. Do __2__ sessions per day.  Gaze Stabilization: Standing Feet Apart    Do this exercise wearing your glasses. Feet shoulder width apart, keeping eyes on target ("A") on wall __3__ feet away, tilt head down slightly, and move head side to side for __30__ seconds. Don't worry about doing this exercise with head moving up/down.  Do _2___ sessions per day.  1.Target must remain in focus, not blurry, and appear stationary while head is in motion. 2.Perform exercises with small head movements (45 to either side of midline). 3.Increase speed of head motion so long as target is in focus.  Carpeted Surface With Diagonal Head Motion    Do in kitchen along your countertop.Place on hand on countertop.  1. Walk forward while moving head right to left (2 steps with head turned to right, 2 steps with head turned to left).  2. Walk forward while moving head up and down.  3. Walk backwards (without moving your head).  Walk the length of your countertop 4 times while performing each item above.

## 2016-02-10 ENCOUNTER — Telehealth: Payer: Self-pay | Admitting: Endocrinology

## 2016-02-10 ENCOUNTER — Ambulatory Visit: Payer: BLUE CROSS/BLUE SHIELD

## 2016-02-10 ENCOUNTER — Encounter: Payer: BLUE CROSS/BLUE SHIELD | Admitting: Occupational Therapy

## 2016-02-10 ENCOUNTER — Ambulatory Visit: Payer: BLUE CROSS/BLUE SHIELD | Admitting: Physical Therapy

## 2016-02-10 DIAGNOSIS — R488 Other symbolic dysfunctions: Secondary | ICD-10-CM

## 2016-02-10 DIAGNOSIS — R41841 Cognitive communication deficit: Secondary | ICD-10-CM

## 2016-02-10 DIAGNOSIS — R2689 Other abnormalities of gait and mobility: Secondary | ICD-10-CM

## 2016-02-10 DIAGNOSIS — R2681 Unsteadiness on feet: Secondary | ICD-10-CM

## 2016-02-10 NOTE — Telephone Encounter (Signed)
Best person to ask is outpatient rehab dr

## 2016-02-10 NOTE — Therapy (Signed)
[facility] [address] [place], [name], [address] Phone: [phone]   Fax:  [phone]  Speech Language Pathology Treatment  Patient Details  Name: Kelly Wilson MRN: [id number] Date of Birth: 1956/06/19 Referring Provider: Delice Lesch M.D.  Encounter Date: 02/10/2016      End of Session - 02/10/16 1425    Visit Number 2   Number of Visits 17   Date for SLP Re-Evaluation 04/17/16   Authorization - Visit Number 2   Authorization - Number of Visits 30   SLP Start Time 1316   SLP Stop Time  1401   SLP Time Calculation (min) 45 min   Activity Tolerance Patient tolerated treatment well      Past Medical History:  Diagnosis Date  . GERD 08/09/2008  . 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 Hyperlipidemia   . HYPERLIPIDEMIA 05/17/2007  . Hypertension   . HYPERTENSION 05/17/2007  . Left ear hearing loss    Mild  . OSTEOARTHRITIS 05/17/2007  . SPINAL STENOSIS 05/17/2007  . TRANSIENT ISCHEMIC ATTACK, HX OF 05/17/2007    Past Surgical History:  Procedure Laterality Date  . ABDOMINAL HYSTERECTOMY  1995  . ANKLE SURGERY  2005  . CESAREAN SECTION  1993  . CRANIOTOMY Right 12/17/2015   Procedure: Suboccipital CRANIectomy INTRACRANIAL ANEURYSM FOR VERTEBRAL/BASILAR;  Surgeon: Consuella Lose, MD;  Location: [facility];  Service: Neurosurgery;  Laterality: Right;  suboccipital  . EXTERNAL EAR SURGERY     left side x's 2  . KNEE SURGERY  02/2011  . ORIF ELBOW FRACTURE  02/2011  . SKIN GRAFT FULL THICKNESS ARM    . SKIN GRAFT FULL THICKNESS LEG    . STAPEDECTOMY    . throidectomy    . THYROIDECTOMY, PARTIAL Right 1980  . TUBAL LIGATION      There were no vitals filed for this visit.      Subjective Assessment - 02/10/16 1324    Subjective "Stayed out of the heat" re: what pt did this weekend.   Currently in Pain? No/denies               ADULT SLP TREATMENT - 02/10/16 1325      General  Information   Behavior/Cognition Alert;Cooperative;Pleasant mood     Treatment Provided   Treatment provided Cognitive-Linquistic     Cognitive-Linquistic Treatment   Treatment focused on Cognition;Aphasia   Skilled Treatment (cognition 28 minutes): SLP facilitated pt's organziational abilities with mod complex number transferring task (work hours, money spent, calories) - pt began task answering the second question and not the first (decr'd attention to detail) and had emergent awareness for this error. With detailed addition from a group of numbers, pt was 93% accurate, without emergent awareness. Simple alternating attention during this task appeared WNL. In alternating attention tasks. In alternating attention tasks (between tasks), pt used good compensation (leaving off at a good stopping point) with what appeared to be WNL skills. In detailed written task, pt with 85% success, and reported to SLP she thought she did well with the task (reduced emergent awareness).  (speech tx <30 minutes): pt typed words (clothing) with 85% success with awareness on one of two errors.      Assessment / Recommendations / Plan   Plan Continue with current plan of care     Progression Toward Goals   Progression toward goals Progressing toward goals            SLP Short Term Goals - 02/10/16  [place] #1   Title pt will demo emergent awareness in mod complex cognitive linguistic tasks 85% of opportunities   Time 4   Period Weeks   Status On-going     SLP SHORT TERM GOAL #2   Title pt will demo attention to detail in mod complex cognitive linguistic tasks to obtain 95% accuracy with self correction allowed   Time 4   Period Weeks   Status On-going     SLP SHORT TERM GOAL #3   Title pt will demo problem solving/reasoning skills in mod complex cognitive linguistic tasks with rare min A   Time 4   Period Weeks   Status On-going     SLP SHORT TERM GOAL #4   Title In written  tasks, pt will demo awareness of written errors with min cues, occasionally   Time 4   Period Weeks   Status On-going          SLP Long Term Goals - 02/10/16 1428      SLP LONG TERM GOAL #1   Title pt will demo anticipatory awareness (i.e., double-checking more detailed work) in four sessions   Time 8   Period Weeks   Status On-going     SLP LONG TERM GOAL #2   Title  pt will demo attention to detail by achieving 100% success with mod complex-complex cognitive linguistic tasks with compensation (double checking work) in four sessions   Time 8   Period Weeks   Status On-going     SLP LONG TERM GOAL #3   Title In mod complex cognitive linguistic tasks, pt will show divided attention 80% of the time, with 90% accuracy on both tasks over two sessions   Time 8   Period Weeks   Status On-going     SLP LONG TERM GOAL #4   Title in written tasks, pt will correct written aphasia errors independently   Time 8   Period Weeks   Status On-going          Plan - 02/10/16 1426    Clinical Impression Statement Pt presents today with cont'd cogntive communication deficits with awareness and attention, and with written aphasia. Some reduced emergent awareness was noted in today's simple cognitive and langauge tasks. 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.Marland Kitchen   Speech Therapy Frequency 2x / week   Duration --  8 weeks   Treatment/Interventions Cognitive reorganization;SLP instruction and feedback;Compensatory strategies;Internal/external aids;Patient/family education;Functional tasks;Cueing hierarchy   Potential to Achieve Goals Good   Potential Considerations --  awareness and attention levels   Consulted and Agree with Plan of Care Patient      Patient will benefit from skilled therapeutic intervention in order to improve the  following deficits and impairments:   Cognitive communication deficit  Aphasic agraphia    Problem List Patient Active Problem List   Diagnosis Date Noted  . Leukopenia   . Bradycardia   . Acute lower UTI   . Agitation   . Lethargic   . Lethargy   . Bilateral headaches   . Other vascular headache   . Hypokalemia   . Heat rash   . Poor appetite   . Hyponatremia   . Cephalalgia   . Obstructive hydrocephalus 12/17/2015  . 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  [place]) 12/17/2015  . Headache 12/17/2015  . IVH (intraventricular hemorrhage) ([place]) 12/16/2015  . Chest pain 05/10/2015  . Precordial chest pain 05/10/2015  . Chest wall pain 05/10/2015  . Pain in the chest   . Wellness examination 01/24/2015  . Myalgia 11/14/2014  . Muscle cramps 11/14/2014  . Pinched nerve 08/28/2014  . Routine general medical examination at a health care facility 08/12/2013  . Cough 04/21/2012  . Lymphadenitis, acute 10/23/2011  . GERD 08/09/2008  . OTHER DYSPHAGIA 06/18/2008  . 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 NECK DISORDER 05/22/2008  . HEADACHE 08/16/2007  . OTITIS MEDIA, ACUTE, LEFT 08/11/2007  . OTHER ABNORMAL BLOOD CHEMISTRY 07/25/2007  . HYPERLIPIDEMIA 05/17/2007  . Essential hypertension 05/17/2007  . OSTEOARTHRITIS 05/17/2007  . SPINAL STENOSIS 05/17/2007  . History of cardiovascular disorder 05/17/2007    [facility] ,[place], [place]  02/10/2016, 2:29 PM  [facility] [address] [place], [name], [address] Phone: [phone]   Fax:  [phone]   Name: Kelly Wilson MRN: [id number] Date of Birth: 28-Jul-1955

## 2016-02-10 NOTE — Telephone Encounter (Signed)
Patient stated that she had aneurysm surgery, and want to know when should she go back to work. Or do she need to go back to the doctor who done the surgery. Please advise

## 2016-02-10 NOTE — Therapy (Signed)
[facility] [address] [place], [place], [address] Phone: [phone]   Fax:  [phone]  Physical Therapy Treatment  Patient Details  Name: Kelly Wilson MRN: [id number] Date of Birth: 11/25/1955 Referring Provider: Ankit Anel Patel, MD  Encounter Date: 02/10/2016      PT End of Session - 02/10/16 1640    Visit Number 6   Number of Visits 13   Date for PT Re-Evaluation 03/22/16   Authorization Type [employer] - 30 visit limit (PT, OT combined)   Authorization - Visit Number 5   Authorization - Number of Visits 30   PT Start Time 1446   PT Stop Time 1530   PT Time Calculation (min) 44 min   Equipment Utilized During Treatment Gait belt   Activity Tolerance Patient limited by fatigue  Seated rest break for community mobility >5 minutes   Behavior During Therapy WFL for tasks assessed/performed      Past Medical History:  Diagnosis Date  . GERD 08/09/2008  . 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 Hyperlipidemia   . HYPERLIPIDEMIA 05/17/2007  . Hypertension   . HYPERTENSION 05/17/2007  . Left ear hearing loss    Mild  . OSTEOARTHRITIS 05/17/2007  . SPINAL STENOSIS 05/17/2007  . TRANSIENT ISCHEMIC ATTACK, HX OF 05/17/2007    Past Surgical History:  Procedure Laterality Date  . ABDOMINAL HYSTERECTOMY  1995  . ANKLE SURGERY  2005  . CESAREAN SECTION  1993  . CRANIOTOMY Right 12/17/2015   Procedure: Suboccipital CRANIectomy INTRACRANIAL ANEURYSM FOR VERTEBRAL/BASILAR;  Surgeon: Neelesh Nundkumar, MD;  Location: [facility];  Service: Neurosurgery;  Laterality: Right;  suboccipital  . EXTERNAL EAR SURGERY     left side x's 2  . KNEE SURGERY  02/2011  . ORIF ELBOW FRACTURE  02/2011  . SKIN GRAFT FULL THICKNESS ARM    . SKIN GRAFT FULL THICKNESS LEG    . STAPEDECTOMY    . throidectomy    . THYROIDECTOMY, PARTIAL Right 1980  . TUBAL LIGATION      There were no vitals filed for this visit.      Subjective  Assessment - 02/10/16 1448    Subjective "You know, my exercises at home are already getting pretty easy.  I'd really like to be able to drive. I don't know who is supposed to tell me that."   Pertinent History Likes to be called "Kelly Wilson".    PMH significant for: HTN, TIA   Patient Stated Goals "I want to get rid of that rolling walker; and to be able to walk around my house without feeling like I have to hold onto something."   Currently in Pain? No/denies                         [facility] Adult PT Treatment/Exercise - 02/10/16 0001      Ambulation/Gait   Ambulation/Gait Yes   Ambulation/Gait Assistance 5: Supervision;6: Modified independent (Device/Increase time)   Ambulation/Gait Assistance Details Without AD, pt mod I (increased time) over level, indoor surfaces and (S) required for unlevel, paved surfaces. Cueing for more normalized (wider) BOS.   Ambulation Distance (Feet) 700 Feet   Assistive device None   Gait Pattern Step-through pattern;Decreased stride length;Decreased trunk rotation;Narrow base of support   Ambulation Surface Level;Unlevel;Indoor;Outdoor;Paved   Stairs Yes   Stairs Assistance 4: Min guard  with cueing for sequencing   Stair Management Technique No rails;Step to pattern;Forwards   Number of   Stairs 8   Height of Stairs 6   Ramp 5: Supervision   Ramp Details (indicate cue type and reason) without AD   Curb 5: Supervision   Curb Details (indicate cue type and reason) without AD; cueing for sequencing         Vestibular Treatment/Exercise - 02/10/16 0001      Vestibular Treatment/Exercise   Vestibular Treatment Provided Gaze   Gaze Exercises X1 Viewing Horizontal     X1 Viewing Horizontal   Foot Position standing; wide BOS   Reps 2   Comments 1 x30 seconds; 1 x45 seconds with effective between-session carryover of technique            Balance Exercises - 02/10/16 1645      Balance Exercises: Standing   Standing Eyes Closed Narrow  base of support (BOS);Head turns;Foam/compliant surface;Other reps (comment)  1 pillow; horiz, vertical x10, diagonally x5 reps each   Gait with Head Turns Forward;4 reps;Intermittent upper extremity support  2 x10' w/ horiz, 2 x10' w/ vertical at countertop   Retro Gait 2 reps;Other (comment)  2 x10' at countertop but no UE support required   Other Standing Exercises Tapping cones 2 x3 reps per LE; progressed to turning over cones, replacing cones in upright position for dynamic SLS x6 reps with each LE; min guard-min A and verbal cueing for grading of weight shifting (especially when in R SLS)           PT Education - 02/10/16 1636    Education provided Yes   Education Details HEP again progressed; see Pt Instructions. Pt now safe to ambulate in home without AD, outside home without AD as long as husband arms-length away to provide supervision.  Recommended pt consult rehab MD, Dr. Patel, about when to return to driving.    Person(s) Educated Patient   Methods Explanation;Demonstration;Verbal cues;Handout   Comprehension Verbalized understanding;Returned demonstration          PT Short Term Goals - 02/10/16 1520      PT SHORT TERM GOAL #1   Title Pt will perform initial HEP with mod I using handout to maximize functional gains made in PT.  (Target date: 02/19/16)   Baseline Met 7/24.   Status Achieved     PT SHORT TERM GOAL #2   Title Pt will improve gait velocity (without AD) from 1.89 ft/sec to >/= 2.19 ft/sec to indicate increased efficiency of mobility. (02/19/16)   Status On-going     PT SHORT TERM GOAL #3   Title Pt will improve DGI from 7/24 to >/= 11/24 to indicate improved dynamic gait stability.  (02/19/16)   Status On-going     PT SHORT TERM GOAL #4   Title Complete Berg and improve score by 4 points from baseline to progress toward significant improvement in standing balance. (02/19/16)   Baseline 46/56 on 01/27/16   Status On-going     PT SHORT TERM GOAL #5   Title  Pt will ambulate 75' over level, indoor surfaces without AD with no overt LOB to indicate progress toward independence with household mobility. (02/19/16)   Baseline Met 7/24.   Status Achieved     PT SHORT TERM GOAL #6   Title Pt will ambulate 200' over unlevel, paved surfaces with mod I using LRAD to progress toward safety with commnuity mobility. (02/19/16)   Status On-going     PT SHORT TERM GOAL #7   Title Pt will negotiate 2 stairs without rails with mod   I using LRAD to indicate increased safety using primary home entrance.  (02/19/16)   Status On-going           PT Long Term Goals - 01/22/16 1312      PT LONG TERM GOAL #1   Title Pt will improve Berg score by 8 points from baseline to indicate significant improvement in dynamic standing balance.  (03/18/16)     PT LONG TERM GOAL #2   Title Pt will improve gait velocity (without AD) from 1.89 ft/sec to >/= 2.49 ft/sec to indicate significantly increased efficiency of ambulation. (03/18/16)     PT LONG TERM GOAL #3   Title Pt will improve DGI from 7/24 to >/= 15/24 to indicate significant improvement in dynamic gait stability, progress toward decreased fall risk.  (03/18/16)     PT LONG TERM GOAL #4   Title Pt will independently ambulate 200' over level, indoor surfaces with effective obstacle negotiation to indicate independence with household ambulaton. (03/18/16)     PT LONG TERM GOAL #5   Title Pt will ambulate 500' over unlevel, paved surfaces with mod I using LRAD to indicate increased safety with community mobility.  (03/18/16)               Plan - 02/10/16 1641    Clinical Impression Statement Session focused on progressing HEP and increasing stability/independence with community mobility. Pt now safe to ambulate in home without AD, outside home without AD as long as husband arms-length away to provide supervision.    Rehab Potential Good   Clinical Impairments Affecting Rehab Potential Positive: age, motivation,  supportive husband.  Negative: financial limitation (30 visit limit combined between OT/PT disciplines)   PT Frequency Other (comment)  2x/week for 4 weeks followed by 1x/week for 4 weeks   PT Duration Other (comment)  see above   PT Treatment/Interventions ADLs/Self Care Home Management;Vestibular;DME Instruction;Gait training;Neuromuscular re-education;Stair training;Functional mobility training;Cognitive remediation;Therapeutic activities;Patient/family education;Therapeutic exercise;Orthotic Fit/Training;Balance training;Manual techniques;Visual/perceptual remediation/compensation   PT Next Visit Plan Standing balance with vestib reliance; functional head turns on varying surfaces (ramp, one foot on step); 180 and 360-degree turns; dynamic SLS.   PT Home Exercise Plan Updated HEP on 7/24.   Consulted and Agree with Plan of Care Patient      Patient will benefit from skilled therapeutic intervention in order to improve the following deficits and impairments:  Abnormal gait, Decreased cognition, Decreased balance, Decreased strength, Impaired vision/preception, Dizziness  Visit Diagnosis: Other abnormalities of gait and mobility  Unsteadiness on feet     Problem List Patient Active Problem List   Diagnosis Date Noted  . Leukopenia   . Bradycardia   . Acute lower UTI   . Agitation   . Lethargic   . Lethargy   . Bilateral headaches   . Other vascular headache   . Hypokalemia   . Heat rash   . Poor appetite   . Hyponatremia   . Cephalalgia   . Obstructive hydrocephalus 12/17/2015  . 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  . Headache 12/17/2015  . IVH (intraventricular hemorrhage) (HCC) 12/16/2015  . Chest pain 05/10/2015  . Precordial chest pain 05/10/2015  . Chest wall pain 05/10/2015  . Pain in the chest   . Wellness examination 01/24/2015  . Myalgia 11/14/2014  . Muscle cramps 11/14/2014  . Pinched nerve 08/28/2014  . Routine general medical  examination at a health care facility 08/12/2013  . Cough 04/21/2012  . Lymphadenitis, acute 10/23/2011  . GERD 08/09/2008  .   OTHER DYSPHAGIA 06/18/2008  . 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 NECK DISORDER 05/22/2008  . HEADACHE 08/16/2007  . OTITIS MEDIA, ACUTE, LEFT 08/11/2007  . OTHER ABNORMAL BLOOD CHEMISTRY 07/25/2007  . HYPERLIPIDEMIA 05/17/2007  . Essential hypertension 05/17/2007  . OSTEOARTHRITIS 05/17/2007  . SPINAL STENOSIS 05/17/2007  . History of cardiovascular disorder 05/17/2007    Billie Ruddy, PT, DPT [facility] [address] [place], [name], [address] Phone: [phone]   Fax:  [phone] 02/10/16, 4:53 PM   Name: VELDA WENDT MRN: [id number] Date of Birth: 06-18-1956

## 2016-02-10 NOTE — Patient Instructions (Signed)
Walking Program:   Set aside time of day for walking for exercise to build up your endurance. Begin with 5 mins at a time (if you need to back down that is fine, but keep with that time for about a week). Do this 2-3 times per day. Can be inside or outside, but if you do outside, then make sure you are doing it either early in the morning or late to avoid excessive heat. If you do 5 mins for several days and its easy, Bump up by one minute, otherwise stay at 5 mins for one week. Try to bump up a minute each week as you are able.   Feet Together (Compliant Surface) Head Motion - Eyes Closed    Stand with your back to a corner with stable chair in front of you. Stand on 1 pillow with feet together. Close eyes and move head slowly: up and down 10 times; right to left 10 times; diagonally up-right to down left 5 times; and diagonally up-left to down right 5 times.  Repeat __2_ times per session. Do __2__ sessions per day.   Gaze Stabilization: Standing Feet Apart    Do this exercise wearing your glasses. Feet shoulder width apart, keeping eyes on target ("A") on wall __3__ feet away, tilt head down slightly, and move head side to side for __45__ seconds. Don't worry about doing this exercise with head moving up/down.  Do _2___ sessions per day.  1.Target must remain in focus, not blurry, and appear stationary while head is in motion. 2.Perform exercises with small head movements (45 to either side of midline). 3.Increase speed of head motion so long as target is in focus.  Carpeted Surface With Diagonal Head Motion    Do in kitchen along your countertop.Place on hand on countertop.  1. Walk forward while moving head right to left (2 steps with head turned to right, 2 steps with head turned to left).  2. Walk forward while moving head up and down.  3. Walk backwards (without moving your head).  Walk the length of your countertop 4 times while performing each item  above.

## 2016-02-11 ENCOUNTER — Telehealth: Payer: Self-pay | Admitting: Physical Medicine & Rehabilitation

## 2016-02-11 ENCOUNTER — Ambulatory Visit: Payer: BLUE CROSS/BLUE SHIELD | Admitting: Physical Therapy

## 2016-02-11 ENCOUNTER — Encounter: Payer: BLUE CROSS/BLUE SHIELD | Admitting: Occupational Therapy

## 2016-02-11 NOTE — Telephone Encounter (Signed)
Please advise on driving?

## 2016-02-11 NOTE — Telephone Encounter (Signed)
Patient needs to know when it's okay for her to drive.  Please call her at [phone].

## 2016-02-11 NOTE — Telephone Encounter (Signed)
Per review of therapy notes, pt continues to have deficits with sequencing, cognition, and vestibular deficits.  She is not safe to return to driving at this time.  Thanks.

## 2016-02-12 ENCOUNTER — Ambulatory Visit: Payer: BLUE CROSS/BLUE SHIELD

## 2016-02-12 DIAGNOSIS — R2689 Other abnormalities of gait and mobility: Secondary | ICD-10-CM | POA: Diagnosis not present

## 2016-02-12 DIAGNOSIS — R488 Other symbolic dysfunctions: Secondary | ICD-10-CM

## 2016-02-12 DIAGNOSIS — R41841 Cognitive communication deficit: Secondary | ICD-10-CM

## 2016-02-12 NOTE — Patient Instructions (Signed)
  Please complete the assigned speech therapy homework and return it to your next session.  

## 2016-02-12 NOTE — Therapy (Signed)
[facility] [address] [place], [name], [address] Phone: [phone]   Fax:  [phone]  Speech Language Pathology Treatment  Patient Details  Name: Kelly Wilson MRN: [id number] Date of Birth: 29-Jul-1955 Referring Provider: Delice Lesch M.D.  Encounter Date: 02/12/2016      End of Session - 02/12/16 1401    Visit Number 3   Number of Visits 17   Date for SLP Re-Evaluation 04/17/16   Authorization - Visit Number 3   Authorization - Number of Visits 30   SLP Start Time 0848   SLP Stop Time  0930   SLP Time Calculation (min) 42 min   Activity Tolerance Patient tolerated treatment well      Past Medical History:  Diagnosis Date  . GERD 08/09/2008  . 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 Hyperlipidemia   . HYPERLIPIDEMIA 05/17/2007  . Hypertension   . HYPERTENSION 05/17/2007  . Left ear hearing loss    Mild  . OSTEOARTHRITIS 05/17/2007  . SPINAL STENOSIS 05/17/2007  . TRANSIENT ISCHEMIC ATTACK, HX OF 05/17/2007    Past Surgical History:  Procedure Laterality Date  . ABDOMINAL HYSTERECTOMY  1995  . ANKLE SURGERY  2005  . CESAREAN SECTION  1993  . CRANIOTOMY Right 12/17/2015   Procedure: Suboccipital CRANIectomy INTRACRANIAL ANEURYSM FOR VERTEBRAL/BASILAR;  Surgeon: Consuella Lose, MD;  Location: [facility];  Service: Neurosurgery;  Laterality: Right;  suboccipital  . EXTERNAL EAR SURGERY     left side x's 2  . KNEE SURGERY  02/2011  . ORIF ELBOW FRACTURE  02/2011  . SKIN GRAFT FULL THICKNESS ARM    . SKIN GRAFT FULL THICKNESS LEG    . STAPEDECTOMY    . throidectomy    . THYROIDECTOMY, PARTIAL Right 1980  . TUBAL LIGATION      There were no vitals filed for this visit.      Subjective Assessment - 02/12/16 0852    Subjective "They are giving me a medication for flu."               ADULT SLP TREATMENT - 02/12/16 0859      General Information   Behavior/Cognition  Alert;Cooperative;Pleasant mood     Treatment Provided   Treatment provided Cognitive-Linquistic     Pain Assessment   Pain Assessment No/denies pain     Cognitive-Linquistic Treatment   Treatment focused on Cognition;Aphasia   Skilled Treatment Pt corrected her detailed written instructions handout from last session, Pt missed two of her five errors made originally. SLP educated pt about how this incident could shed light on her ability to attend to details in daily tasks, targeting incr'd pt intellectual and anticipatory awareness. SLP provided pt with his computer and engaged pt with a written language task to write a 4-5 sentence thank you note, in which pt used "durable" instead of "endurable" without noticing the error. SLP explained to pt (again for intellectual, emergent, and anticipatory awareness) that given her errors in writing she is likely having difficulty with aphasia, and difficulty noting errors in her written language as well.      Assessment / Recommendations / Plan   Plan Continue with current plan of care     Progression Toward Goals   Progression toward goals Progressing toward goals          SLP Education - 02/12/16 1400    Education provided Yes   Education Details deficit areas, how they may impact daily activities.   Person(s)  Educated Patient   Methods Explanation;Demonstration   Comprehension Verbalized understanding;Need further instruction          SLP Short Term Goals - 02/12/16 1403      SLP SHORT TERM GOAL #1   Title pt will demo emergent awareness in mod complex cognitive linguistic tasks 85% of opportunities   Time 3   Period Weeks   Status On-going     SLP SHORT TERM GOAL #2   Title pt will demo attention to detail in mod complex cognitive linguistic tasks to obtain 95% accuracy with self correction allowed   Time 3   Period Weeks   Status On-going     SLP SHORT TERM GOAL #3   Title pt will demo problem solving/reasoning skills in  mod complex cognitive linguistic tasks with rare min A   Time 3   Period Weeks   Status On-going     SLP SHORT TERM GOAL #4   Title In written tasks, pt will demo awareness of written errors with min cues, occasionally   Time 3   Period Weeks   Status On-going          SLP Long Term Goals - 02/12/16 1403      SLP LONG TERM GOAL #1   Title pt will demo anticipatory awareness (i.e., double-checking more detailed work) in four sessions   Time 7   Period Weeks   Status On-going     SLP LONG TERM GOAL #2   Title  pt will demo attention to detail by achieving 100% success with mod complex-complex cognitive linguistic tasks with compensation (double checking work) in four sessions   Time 7   Period Weeks   Status On-going     SLP LONG TERM GOAL #3   Title In mod complex cognitive linguistic tasks, pt will show divided attention 80% of the time, with 90% accuracy on both tasks over two sessions   Time 7   Period Weeks   Status On-going     SLP LONG TERM GOAL #4   Title in written tasks, pt will correct written aphasia errors independently   Time 7   Period Weeks   Status On-going          Plan - 02/12/16 1401    Clinical Impression Statement Pt presents today with cont'd cogntive communication deficits with awareness and attention, and with written aphasia. Reduced emergent awareness was again noted in mod complex cognitive, and in simple langauge tasks. 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.Marland Kitchen   Speech Therapy Frequency 2x / week   Duration --  7 weeks   Treatment/Interventions Cognitive reorganization;SLP instruction and feedback;Compensatory strategies;Internal/external aids;Patient/family education;Functional tasks;Cueing hierarchy   Potential to Achieve Goals Good   Potential Considerations --  awareness and  attention levels   Consulted and Agree with Plan of Care Patient      Patient will benefit from skilled therapeutic intervention in order to improve the following deficits and impairments:   Cognitive communication deficit  Aphasic agraphia    Problem List Patient Active Problem List   Diagnosis Date Noted  . Leukopenia   . Bradycardia   . Acute lower UTI   . Agitation   . Lethargic   . Lethargy   . Bilateral headaches   . Other vascular headache   . Hypokalemia   .  Heat rash   . Poor appetite   . Hyponatremia   . Cephalalgia   . Obstructive hydrocephalus 12/17/2015  . 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[place]) 12/17/2015  . Headache 12/17/2015  . IVH (intraventricular hemorrhage) ([place]) 12/16/2015  . Chest pain 05/10/2015  . Precordial chest pain 05/10/2015  . Chest wall pain 05/10/2015  . Pain in the chest   . Wellness examination 01/24/2015  . Myalgia 11/14/2014  . Muscle cramps 11/14/2014  . Pinched nerve 08/28/2014  . Routine general medical examination at a health care facility 08/12/2013  . Cough 04/21/2012  . Lymphadenitis, acute 10/23/2011  . GERD 08/09/2008  . OTHER DYSPHAGIA 06/18/2008  . 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 NECK DISORDER 05/22/2008  . HEADACHE 08/16/2007  . OTITIS MEDIA, ACUTE, LEFT 08/11/2007  . OTHER ABNORMAL BLOOD CHEMISTRY 07/25/2007  . HYPERLIPIDEMIA 05/17/2007  . Essential hypertension 05/17/2007  . OSTEOARTHRITIS 05/17/2007  . SPINAL STENOSIS 05/17/2007  . History of cardiovascular disorder 05/17/2007    [facility] ,[place], [place]  02/12/2016, 2:05 PM  [place] [address] [place], [name], [address] Phone: [phone]   Fax:  [phone]   Name: Kelly Wilson MRN: [id number] Date of Birth: 09-Dec-1955

## 2016-02-12 NOTE — Telephone Encounter (Signed)
Pt was notified and verbalized understanding.

## 2016-02-12 NOTE — Telephone Encounter (Signed)
Patient became very upset when I relayed the message about driving. She states she doesn't even have to use her cane when she leaves the house. She really wants to get back to work. To ease her concerns, I made her an appointment next Friday so she could be evaluated for driving and work.

## 2016-02-12 NOTE — Telephone Encounter (Signed)
Patient had left a message about being able to drive.  If someone could please call patient back at [phone].

## 2016-02-17 ENCOUNTER — Encounter: Payer: BLUE CROSS/BLUE SHIELD | Admitting: Occupational Therapy

## 2016-02-17 ENCOUNTER — Ambulatory Visit: Payer: BLUE CROSS/BLUE SHIELD | Admitting: Physical Therapy

## 2016-02-17 DIAGNOSIS — R2689 Other abnormalities of gait and mobility: Secondary | ICD-10-CM

## 2016-02-17 DIAGNOSIS — R2681 Unsteadiness on feet: Secondary | ICD-10-CM

## 2016-02-17 NOTE — Therapy (Signed)
[facility] [address] [place], [name], [address] Phone: [phone]   Fax:  [phone]  Physical Therapy Treatment  Patient Details  Name: Kelly Wilson MRN: [id number] Date of Birth: Aug 20, 1955 Referring Provider: Tito Dine, MD  Encounter Date: 02/17/2016      PT End of Session - 02/17/16 1445    Visit Number 7   Number of Visits 13   Date for PT Re-Evaluation 03/22/16   Authorization Type [employer] - 30 visit limit (PT, OT combined)   Authorization - Visit Number 6   Authorization - Number of Visits 30   PT Start Time 1402   PT Stop Time 1440   PT Time Calculation (min) 38 min   Equipment Utilized During Treatment Gait belt   Activity Tolerance Patient tolerated treatment well   Behavior During Therapy WFL for tasks assessed/performed      Past Medical History:  Diagnosis Date  . GERD 08/09/2008  . 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 Hyperlipidemia   . HYPERLIPIDEMIA 05/17/2007  . Hypertension   . HYPERTENSION 05/17/2007  . Left ear hearing loss    Mild  . OSTEOARTHRITIS 05/17/2007  . SPINAL STENOSIS 05/17/2007  . TRANSIENT ISCHEMIC ATTACK, HX OF 05/17/2007    Past Surgical History:  Procedure Laterality Date  . ABDOMINAL HYSTERECTOMY  1995  . ANKLE SURGERY  2005  . CESAREAN SECTION  1993  . CRANIOTOMY Right 12/17/2015   Procedure: Suboccipital CRANIectomy INTRACRANIAL ANEURYSM FOR VERTEBRAL/BASILAR;  Surgeon: Consuella Lose, MD;  Location: [facility];  Service: Neurosurgery;  Laterality: Right;  suboccipital  . EXTERNAL EAR SURGERY     left side x's 2  . KNEE SURGERY  02/2011  . ORIF ELBOW FRACTURE  02/2011  . SKIN GRAFT FULL THICKNESS ARM    . SKIN GRAFT FULL THICKNESS LEG    . STAPEDECTOMY    . throidectomy    . THYROIDECTOMY, PARTIAL Right 1980  . TUBAL LIGATION      There were no vitals filed for this visit.      Subjective Assessment - 02/17/16 1404    Subjective Pt  reports no significant changes, no falls. Seeing Dr. Posey Pronto on Friday to discuss return to driving.   Pertinent History Likes to be called "Kelly Wilson".    PMH significant for: HTN, TIA   Patient Stated Goals "I want to get rid of that rolling walker; and to be able to walk around my house without feeling like I have to hold onto something."   Currently in Pain? No/denies            [facility] PT Assessment - 02/17/16 0001      Standardized Balance Assessment   Standardized Balance Assessment Berg Balance Test;Dynamic Gait Index     Berg Balance Test   Sit to Stand Able to stand without using hands and stabilize independently   Standing Unsupported Able to stand safely 2 minutes   Sitting with Back Unsupported but Feet Supported on Floor or Stool Able to sit safely and securely 2 minutes   Stand to Sit Sits safely with minimal use of hands   Transfers Able to transfer safely, minor use of hands   Standing Unsupported with Eyes Closed Able to stand 10 seconds safely   Standing Ubsupported with Feet Together Able to place feet together independently and stand 1 minute safely   From Standing, Reach Forward with Outstretched Arm Can reach confidently >25 cm (10")   From Standing Position,  Pick up Object from [place] to pick up shoe safely and easily   From Standing Position, Turn to Look Behind Over each Shoulder Looks behind from both sides and weight shifts well   Turn 360 Degrees Able to turn 360 degrees safely in 4 seconds or less   Standing Unsupported, Alternately Place Feet on Step/Stool Able to stand independently and safely and complete 8 steps in 20 seconds   Standing Unsupported, One Foot in [place] to place foot tandem independently and hold 30 seconds   Standing on One Leg Able to lift leg independently and hold equal to or more than 3 seconds   Total Score 54     Dynamic Gait Index   Level Surface Mild Impairment   Change in Gait Speed Mild Impairment   Gait with Horizontal  Head Turns Mild Impairment   Gait with Vertical Head Turns Moderate Impairment   Gait and Pivot Turn Mild Impairment   Step Over Obstacle Normal   Step Around Obstacles Normal   Steps Mild Impairment   Total Score 17   DGI comment: < 19 indicates increased fall risk                     [facility] Adult PT Treatment/Exercise - 02/17/16 0001      Transfers   Transfers Sit to Stand;Stand to Sit   Sit to Stand 7: Independent   Stand to Sit 7: Independent     Ambulation/Gait   Ambulation/Gait Yes   Ambulation/Gait Assistance 6: Modified independent (Device/Increase time);5: Supervision   Ambulation/Gait Assistance Details Mod I for gait without head turns over level and unlevel surfaces; (S) for gait with functional head turns over unlevel, paved and grass surfaces   Ambulation Distance (Feet) 450 Feet   Assistive device None   Gait Pattern Step-through pattern;Narrow base of support   Ambulation Surface Level;Unlevel;Indoor;Outdoor;Paved;Grass   Gait velocity 3.19 ft/sec   Stairs Yes   Stairs Assistance 6: Modified independent (Device/Increase time)   Stair Management Technique No rails;With cane;Step to pattern;Forwards   Number of Stairs 4   Height of Stairs 6   Ramp 6: Modified independent (Device)   Ramp Details (indicate cue type and reason) without AD   Curb 5: Supervision   Curb Details (indicate cue type and reason) without AD, required (S), cueing for sequenciing (ascending with LLE leading, descending with RLE leading) with effective return demo during subsequent trial.                PT Education - 02/17/16 1915    Education provided Yes   Education Details Berg and DGI findings, progress, and functional implications.  Sequencing for curb step negotiation.    Person(s) Educated Patient   Methods Explanation;Demonstration   Comprehension Verbalized understanding;Need further instruction  May need to review sequencing with curb step negotiation           PT Short Term Goals - 02/17/16 1416      PT SHORT TERM GOAL #1   Title Pt will perform initial HEP with mod I using handout to maximize functional gains made in PT.  (Target date: 02/19/16)   Baseline Met 7/24.   Status Achieved     PT SHORT TERM GOAL #2   Title Pt will improve gait velocity (without AD) from 1.89 ft/sec to >/= 2.19 ft/sec to indicate increased efficiency of mobility. (02/19/16)   Baseline Met 7/31 with gait velocity of 3.19 ft/sec.   Status Achieved  PT SHORT TERM GOAL #3   Title Pt will improve DGI from 7/24 to >/= 11/24 to indicate improved dynamic gait stability.  (02/19/16)   Baseline Met 7/31 with score of 17/24.   Status Achieved     PT SHORT TERM GOAL #4   Title Complete Berg and improve score by 4 points from baseline to progress toward significant improvement in standing balance. (02/19/16)   Baseline 46/56 on 01/27/16;  54/56 on 7/31.   Status Achieved     PT SHORT TERM GOAL #5   Title Pt will ambulate 67' over level, indoor surfaces without AD with no overt LOB to indicate progress toward independence with household mobility. (02/19/16)   Baseline Met 7/24.   Status Achieved     PT SHORT TERM GOAL #6   Title Pt will ambulate 200' over unlevel, paved surfaces with mod I using LRAD to progress toward safety with commnuity mobility. (02/19/16)   Baseline Met 7/31.   Status Achieved     PT SHORT TERM GOAL #7   Title Pt will negotiate 2 stairs without rails with mod I using LRAD to indicate increased safety using primary home entrance.  (02/19/16)   Baseline Met 7/31.   Status Achieved           PT Long Term Goals - 02/17/16 1417      PT LONG TERM GOAL #1   Title Pt will improve Berg score by 8 points from baseline to indicate significant improvement in dynamic standing balance.  (03/18/16)   Baseline Met 7/31.   Status Achieved     PT LONG TERM GOAL #2   Title Pt will improve gait velocity (without AD) from 1.89 ft/sec to >/= 2.49 ft/sec to  indicate significantly increased efficiency of ambulation. (03/18/16)   Baseline Met 7/31 with gait velocity of 3.19 ft/sec.   Status Achieved     PT LONG TERM GOAL #3   Title Pt will improve DGI from 7/24 to > 20/24 to indicate decreased fall risk.  (03/18/16)   Baseline REVISED from 15 to >20/24 due to patient progress.   Status Revised     PT LONG TERM GOAL #4   Title Pt will independently ambulate 200' over level, indoor surfaces with effective obstacle negotiation to indicate independence with household ambulaton. (03/18/16)   Baseline Met 7/31.   Status Achieved     PT LONG TERM GOAL #5   Title Pt will independently ambulate 1,000' over unlevel, paved and grass surfaces with no overt LOB to indicate increased safety with community mobility.  (03/18/16)   Baseline 7/31: REVISED from 500' with mod I to 1,000' independently due to pt progress.   Status Revised               Plan - 02/17/16 1446    Clinical Impression Statement Pt has met 7/7 STG's, indicating marked improvement in stability/independence with functional mobility. Pt has demonstrated consistent HEP compliance and excellent progress with PT. Revised LTG's for DGI and community mobility due to significant progress.    Rehab Potential Good   Clinical Impairments Affecting Rehab Potential Positive: age, motivation, supportive husband.  Negative: financial limitation (30 visit limit combined between OT/PT disciplines)   PT Frequency Other (comment)  2x/week for 4 weeks followed by 1x/week for 4 weeks   PT Duration Other (comment)  see above   PT Treatment/Interventions ADLs/Self Care Home Management;Vestibular;DME Instruction;Gait training;Neuromuscular re-education;Stair training;Functional mobility training;Cognitive remediation;Therapeutic activities;Patient/family education;Therapeutic exercise;Orthotic Fit/Training;Balance training;Manual techniques;Visual/perceptual remediation/compensation  PT Next Visit Plan  Standing balance with vestib reliance; functional head turns on varying surfaces (ramp, one foot on step); 180 and 360-degree turns; dynamic SLS. Step-ups/downs. Obstacle negotiation, dynamic SLS.   PT Home Exercise Plan Updated HEP on 7/24.   Consulted and Agree with Plan of Care Patient      Patient will benefit from skilled therapeutic intervention in order to improve the following deficits and impairments:  Abnormal gait, Decreased cognition, Decreased balance, Decreased strength, Impaired vision/preception, Dizziness  Visit Diagnosis: Other abnormalities of gait and mobility  Unsteadiness on feet     Problem List Patient Active Problem List   Diagnosis Date Noted  . Leukopenia   . Bradycardia   . Acute lower UTI   . Agitation   . Lethargic   . Lethargy   . Bilateral headaches   . Other vascular headache   . Hypokalemia   . Heat rash   . Poor appetite   . Hyponatremia   . Cephalalgia   . Obstructive hydrocephalus 12/17/2015  . 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[place]) 12/17/2015  . Headache 12/17/2015  . IVH (intraventricular hemorrhage) ([place]) 12/16/2015  . Chest pain 05/10/2015  . Precordial chest pain 05/10/2015  . Chest wall pain 05/10/2015  . Pain in the chest   . Wellness examination 01/24/2015  . Myalgia 11/14/2014  . Muscle cramps 11/14/2014  . Pinched nerve 08/28/2014  . Routine general medical examination at a health care facility 08/12/2013  . Cough 04/21/2012  . Lymphadenitis, acute 10/23/2011  . GERD 08/09/2008  . OTHER DYSPHAGIA 06/18/2008  . 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 NECK DISORDER 05/22/2008  . HEADACHE 08/16/2007  . OTITIS MEDIA, ACUTE, LEFT 08/11/2007  . OTHER ABNORMAL BLOOD CHEMISTRY 07/25/2007  . HYPERLIPIDEMIA 05/17/2007  . Essential hypertension 05/17/2007  . OSTEOARTHRITIS 05/17/2007  . SPINAL STENOSIS 05/17/2007  . History of cardiovascular disorder 05/17/2007   Billie Ruddy, PT, DPT [facility] [address] [place], [name], [address] Phone: [phone]   Fax:  [phone] 02/17/16, 7:25 PM  Name: Kelly Wilson MRN: [id number] Date of Birth: 13-Jul-1956

## 2016-02-18 ENCOUNTER — Encounter: Payer: BLUE CROSS/BLUE SHIELD | Admitting: Occupational Therapy

## 2016-02-19 ENCOUNTER — Ambulatory Visit: Payer: BLUE CROSS/BLUE SHIELD | Admitting: Physical Therapy

## 2016-02-21 ENCOUNTER — Encounter: Payer: Self-pay | Admitting: Physical Medicine & Rehabilitation

## 2016-02-21 ENCOUNTER — Encounter
Payer: BLUE CROSS/BLUE SHIELD | Attending: Physical Medicine & Rehabilitation | Admitting: Physical Medicine & Rehabilitation

## 2016-02-21 ENCOUNTER — Other Ambulatory Visit: Payer: Self-pay | Admitting: Physical Medicine & Rehabilitation

## 2016-02-21 VITALS — BP 139/87 | HR 78 | Resp 14

## 2016-02-21 DIAGNOSIS — Z8673 Personal history of transient ischemic attack (TIA), and cerebral infarction without residual deficits: Secondary | ICD-10-CM | POA: Insufficient documentation

## 2016-02-21 DIAGNOSIS — R630 Anorexia: Secondary | ICD-10-CM

## 2016-02-21 DIAGNOSIS — M1990 Unspecified osteoarthritis, unspecified site: Secondary | ICD-10-CM | POA: Diagnosis not present

## 2016-02-21 DIAGNOSIS — I10 Essential (primary) hypertension: Secondary | ICD-10-CM | POA: Insufficient documentation

## 2016-02-21 DIAGNOSIS — Z9889 Other specified postprocedural states: Secondary | ICD-10-CM | POA: Insufficient documentation

## 2016-02-21 DIAGNOSIS — D72819 Decreased white blood cell count, unspecified: Secondary | ICD-10-CM | POA: Diagnosis not present

## 2016-02-21 DIAGNOSIS — R2689 Other abnormalities of gait and mobility: Secondary | ICD-10-CM | POA: Diagnosis not present

## 2016-02-21 DIAGNOSIS — R531 Weakness: Secondary | ICD-10-CM | POA: Diagnosis not present

## 2016-02-21 DIAGNOSIS — K219 Gastro-esophageal reflux disease without esophagitis: Secondary | ICD-10-CM | POA: Insufficient documentation

## 2016-02-21 DIAGNOSIS — I729 Aneurysm of unspecified site: Secondary | ICD-10-CM | POA: Insufficient documentation

## 2016-02-21 DIAGNOSIS — I69398 Other sequelae of cerebral infarction: Secondary | ICD-10-CM | POA: Diagnosis not present

## 2016-02-21 DIAGNOSIS — E785 Hyperlipidemia, unspecified: Secondary | ICD-10-CM | POA: Insufficient documentation

## 2016-02-21 DIAGNOSIS — G919 Hydrocephalus, unspecified: Secondary | ICD-10-CM | POA: Insufficient documentation

## 2016-02-21 NOTE — Progress Notes (Signed)
Subjective:    Patient ID: Kelly Wilson, female    DOB: [birth year], 60 y.o.   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 Last clinic visit 01/23/16.  Today pt presents with desire to drive.  She insists that she does not have any residual symptoms and is ready to return to driving.   Her PCP ordered labs on last visit.  Denies headaches. She remains to have a poor appetite.   Pain Inventory Average Pain 0 Pain Right Now 0  My pain is no pain  In the last 24 hours, has pain interfered with the following? General activity 0 Relation with others 0 Enjoyment of life 0 What TIME of day is your pain at its worst? no pain Sleep (in general) Good  Pain is worse with: no pain Pain improves with: no pain Relief from Meds: no pain  Mobility walk without assistance  Function employed # of hrs/week needs to go back to work Do you have any goals in this area?  yes  Neuro/Psych No problems in this area  Prior Studies Any changes since last visit?  no CT/MRI  Physicians involved in your care Any changes since last visit?  no   Family History  Problem Relation Age of Onset  . Coronary artery disease Mother   . Diabetes Mother   . Coronary artery disease Father   . Diabetes Sister     2 sister with DM  . Cancer Other     uncle had rectal cancer  . Hypertension Other   . Hyperlipidemia Other    Social History   Social History  . Marital status: Married    Spouse name: N/A  . Number of children: N/A  . Years of education: N/A   Occupational History  . realtor    Social History Main Topics  . Smoking status: Never Smoker  . Smokeless tobacco: Never Used     Comment: Married, she is a Cabin [employer]  . Alcohol use No  . Drug use: No  . Sexual activity: Not Asked   Other Topics Concern  . None    Social History Narrative   Married, Cabin crew, 2 daughters   Caffeine use: tea daily   Past Surgical History:  Procedure Laterality Date  . ABDOMINAL HYSTERECTOMY  1995  . ANKLE SURGERY  2005  . CESAREAN SECTION  1993  . CRANIOTOMY Right 12/17/2015   Procedure: Suboccipital CRANIectomy INTRACRANIAL ANEURYSM FOR VERTEBRAL/BASILAR;  Surgeon: Consuella Lose, MD;  Location: [facility];  Service: Neurosurgery;  Laterality: Right;  suboccipital  . EXTERNAL EAR SURGERY     left side x's 2  . KNEE SURGERY  02/2011  . ORIF ELBOW FRACTURE  02/2011  . SKIN GRAFT FULL THICKNESS ARM    . SKIN GRAFT FULL THICKNESS LEG    . STAPEDECTOMY    . throidectomy    . THYROIDECTOMY, PARTIAL Right 1980  . TUBAL LIGATION     Past Medical History:  Diagnosis Date  . GERD 08/09/2008  . 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 Hyperlipidemia   . HYPERLIPIDEMIA 05/17/2007  . Hypertension   . HYPERTENSION 05/17/2007  . Left ear hearing loss    Mild  . OSTEOARTHRITIS 05/17/2007  . SPINAL STENOSIS 05/17/2007  . TRANSIENT ISCHEMIC ATTACK, HX OF 05/17/2007   There were no vitals taken for this visit.  Opioid Risk  Score:   Fall Risk Score:  `1  Depression screen PHQ 2/9  No flowsheet data found.  Review of Systems  HENT: Negative.   Eyes: Negative.   Respiratory: Negative.   Cardiovascular: Negative.   Endocrine: Negative.   Genitourinary: Negative.   Musculoskeletal: Negative.   Skin: Negative.   Allergic/Immunologic: Negative.   Neurological: Negative.   Hematological: Negative.   Psychiatric/Behavioral: Negative.   All other systems reviewed and are negative.     Objective:   Physical Exam Constitutional: She appears well-developed. No distress. Obese  HENT:  Normocephalic and atraumatic.  Eyes: EOM and Conj are normal.   Cardiovascular: regular rhythm and rate.   Respiratory: Effort normal and breath sounds normal. No respiratory distress.   GI: Soft. Bowel sounds are normal. She  exhibits no distension.  Neurological: She is alert and oriented.    Strength 5/5 bilateral upper ext and 5/5 HF, 5/5 KE and 5/5 ADFPF.  Neg ataxia/dysmetria. Mild nystagmus at extremes of vision. Skin: She is not diaphoretic. Warm and dry. Surgical incision c/d/i. Red/macular rash on back, resolved.  Psychiatric: She has a flat affect.       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   1.  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   2. Poor appetite  Improving  Pt trying to eat 3 meals/day, not interested in supplements, Boost, ensure, etc. at this time  3. Leukopenia  Recent labs reviewed, appears to have resolved.

## 2016-02-24 ENCOUNTER — Ambulatory Visit: Payer: BLUE CROSS/BLUE SHIELD | Admitting: *Deleted

## 2016-02-24 ENCOUNTER — Ambulatory Visit: Payer: BLUE CROSS/BLUE SHIELD | Attending: Physical Medicine & Rehabilitation | Admitting: Physical Therapy

## 2016-02-24 ENCOUNTER — Encounter: Payer: BLUE CROSS/BLUE SHIELD | Admitting: Occupational Therapy

## 2016-02-24 DIAGNOSIS — R41841 Cognitive communication deficit: Secondary | ICD-10-CM | POA: Diagnosis present

## 2016-02-24 DIAGNOSIS — R488 Other symbolic dysfunctions: Secondary | ICD-10-CM | POA: Diagnosis present

## 2016-02-24 DIAGNOSIS — R2681 Unsteadiness on feet: Secondary | ICD-10-CM | POA: Insufficient documentation

## 2016-02-24 DIAGNOSIS — R2689 Other abnormalities of gait and mobility: Secondary | ICD-10-CM | POA: Diagnosis not present

## 2016-02-24 NOTE — Therapy (Signed)
[facility] [address] [place], [name], [address] Phone: [phone]   Fax:  [phone]  Physical Therapy Treatment  Patient Details  Name: Kelly Wilson MRN: [id number] Date of Birth: [birth year] Referring Provider: Jamse Arn, MD  Encounter Date: 02/24/2016      PT End of Session - 02/24/16 1121    Visit Number 8   Number of Visits 13   Date for PT Re-Evaluation 03/25/16   Authorization Type [employer] - 30 visit limit (PT, OT combined)   Authorization - Visit Number 7   Authorization - Number of Visits 30   PT Start Time 1015   PT Stop Time 1100   PT Time Calculation (min) 45 min   Equipment Utilized During Treatment Gait belt;Other (comment)  Balance Master and harness   Activity Tolerance Patient tolerated treatment well   Behavior During Therapy WFL for tasks assessed/performed      Past Medical History:  Diagnosis Date  . GERD 08/09/2008  . 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 Hyperlipidemia   . HYPERLIPIDEMIA 05/17/2007  . Hypertension   . HYPERTENSION 05/17/2007  . Left ear hearing loss    Mild  . OSTEOARTHRITIS 05/17/2007  . SPINAL STENOSIS 05/17/2007  . TRANSIENT ISCHEMIC ATTACK, HX OF 05/17/2007    Past Surgical History:  Procedure Laterality Date  . ABDOMINAL HYSTERECTOMY  1995  . ANKLE SURGERY  2005  . CESAREAN SECTION  1993  . 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 EXTERNAL EAR SURGERY     left side x's 2  . KNEE SURGERY  02/2011  . ORIF ELBOW FRACTURE  02/2011  . SKIN GRAFT FULL THICKNESS ARM    . SKIN GRAFT FULL THICKNESS LEG    . STAPEDECTOMY    . throidectomy    . THYROIDECTOMY, PARTIAL Right 1980  . TUBAL LIGATION      There were no vitals filed for this visit.      Subjective  Assessment - 02/24/16 1105    Subjective "I drove here today. Dr. Posey Pronto reluctantly told me that I could, but I have to practice with my husband there first. I'm also going back to work today."   Pertinent History Likes to be called "[name]".    PMH significant for: HTN, TIA   Patient Stated Goals "I want to get rid of that rolling walker; and to be able to walk around my house without feeling like I have to hold onto something."   Currently in Pain? 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     Functional Gait  Assessment   Gait assessed  Yes   Gait Level Surface Walks 20 ft in less than 5.5 sec, no assistive devices, good speed, no evidence for imbalance, normal gait pattern, deviates no more than 6 in outside of the 12 in walkway width.   Change in Gait Speed Able to smoothly change walking speed without loss of balance or gait deviation. Deviate no more than 6 in outside of the 12 in walkway width.   Gait with Horizontal Head Turns Performs head turns smoothly with slight change in gait velocity (eg, minor disruption to smooth gait path), deviates 6-10 in outside 12 in walkway width, or uses an assistive device.   Gait with Vertical Head Turns Performs head turns with no  change in gait. Deviates no more than 6 in outside 12 in walkway width.   Gait and Pivot Turn Pivot turns safely within 3 sec and stops quickly with no loss of balance.   Step Over Obstacle Is able to step over one shoe box (4.5 in total height) without changing gait speed. No evidence of imbalance.   Gait with Narrow Base of Support Ambulates 7-9 steps.   Gait with Eyes Closed Walks 20 ft, slow speed, abnormal gait pattern, evidence for imbalance, deviates 10-15 in outside 12 in walkway width. Requires more than 9 sec to ambulate 20 ft.   Ambulating Backwards Walks 20 ft, uses assistive device, slower speed, mild gait deviations, deviates 6-10 in outside 12 in walkway width.   Steps Alternating feet, no rail.   Total Score 24   FGA comment: >/= 22/30 indicates low fall risk.            Vestibular Assessment - 02/24/16 0001      Balancemaster   Therapist, occupational Comment Composite score = 56% (but improved 46% on 7/17) compared to age/height normative value of 69%. Sensory analysis: Somatosensory = 90 (norm = 92); Visual = 80 (norm = 82), and Vestibular = 0 (norm = 55).  Findings suggest significant impairment in ability to use vestibular input for balance, mildly impaired ability to use somatosensory and visual input for balance. 3 "falls" sustained, all during Condition 5. Ankle strategy dominant during Conditions 5. COG alignment: slightly to L of midline.                       Balance Exercises - 02/24/16 1120      Balance Exercises: Standing   Standing Eyes Opened Narrow base of support (BOS);Head turns;Foam/compliant surface;Other reps (comment)  1 pillow; horiz, vert, diagonal turns x10 each   Standing Eyes Closed Narrow base of support (BOS);Foam/compliant surface;Other reps (comment)  1 pillow; horiz, vert, diagonal turns x10 each           PT Education - 02/24/16 1120    Education provided Yes   Education Details DGI,  FGA, and SOT findings, progress, and functional implications. HEP modified to address impairments outlines by SOT.   Person(s) Educated Patient   Methods Explanation;Demonstration;Handout;Verbal cues   Comprehension Verbalized understanding;Returned demonstration          PT Short Term Goals - 02/17/16 1416      PT SHORT TERM GOAL #1   Title Pt will perform initial HEP with mod I using handout to maximize functional gains made in PT.  (Target  date: 02/19/16)   Baseline Met 7/24.   Status Achieved     PT SHORT TERM GOAL #2   Title Pt will improve gait velocity (without AD) from 1.89 ft/sec to >/= 2.19 ft/sec to indicate increased efficiency of mobility. (02/19/16)   Baseline Met 7/31 with gait velocity of 3.19 ft/sec.   Status Achieved     PT SHORT TERM GOAL #3   Title Pt will improve DGI from 7/24 to >/= 11/24 to indicate improved dynamic gait stability.  (02/19/16)   Baseline Met 7/31 with score of 17/24.   Status Achieved     PT SHORT TERM GOAL #4   Title Complete Berg and improve score by 4 points from baseline to progress toward significant improvement in standing balance. (02/19/16)   Baseline 46/56 on 01/27/16;  54/56 on 7/31.   Status Achieved     PT SHORT TERM GOAL #5   Title Pt will ambulate 53' over level, indoor surfaces without AD with no overt LOB to indicate progress toward independence with household mobility. (02/19/16)   Baseline Met 7/24.   Status Achieved     PT SHORT TERM GOAL #6   Title Pt will ambulate 200' over unlevel, paved surfaces with mod I using LRAD to progress toward safety with commnuity mobility. (02/19/16)   Baseline Met 7/31.   Status Achieved     PT SHORT TERM GOAL #7   Title Pt will negotiate 2 stairs without rails with mod I using LRAD to indicate increased safety using primary home entrance.  (02/19/16)   Baseline Met 7/31.   Status Achieved           PT Long Term Goals - 02/24/16 1109      PT LONG TERM GOAL #1   Title Pt will improve Berg  score by 8 points from baseline to indicate significant improvement in dynamic standing balance.  (03/18/16)   Baseline Met 7/31.   Status Achieved     PT LONG TERM GOAL #2   Title Pt will improve gait velocity (without AD) from 1.89 ft/sec to >/= 2.49 ft/sec to indicate significantly increased efficiency of ambulation. (03/18/16)   Baseline Met 7/31 with gait velocity of 3.19 ft/sec.   Status Achieved     PT LONG TERM GOAL #3   Title Pt will improve DGI from 7/24 to > 20/24 to indicate decreased fall risk.  (03/18/16)   Baseline Met 8/7, with score of 22/24.   Status Achieved     PT LONG TERM GOAL #4   Title Pt will independently ambulate 200' over level, indoor surfaces with effective obstacle negotiation to indicate independence with household ambulaton. (03/18/16)   Baseline Met 7/31.   Status Achieved     PT LONG TERM GOAL #5   Title Pt will independently ambulate 1,000' over unlevel, paved and grass surfaces with no overt LOB to indicate increased safety with community mobility.  (03/18/16)   Status On-going     Additional Long Term Goals   Additional Long Term Goals Yes     PT LONG TERM GOAL #6   Title Pt will improve FGA score from 24 to 28/30 to indicate significant improvement in dynamic gait.  (Target date: 03/18/16)     PT LONG TERM GOAL #7   Title Pt will improve SOT composite score from 56% to 64% to indicate significant improvement in use of multi-sensory input for balance.  (03/18/16)     PT LONG TERM GOAL #8   Title  Pt will improve SOT Vestibular score from 0% to >/= 25% to indicate significant improvement in use of vestibular input for balance.  (03/18/16)               Plan - 02/24/16 1122    Clinical Impression Statement Pt continuing to demonstrate between-session improvement in dynamic gait stability and standing balance. Pt has now met LTG for DGI, as pt scored 22/24 on DGI during this session. FGA score = 24/30. SOT composite score has improved markedly  (by 10%) since last assessed on 7/17; however, composite score continues to be below age/height normative value. SOT also indicated significantly impaired pt ability to use vestibular input for balance, and mild impairment in ability to use visual and somatosensory inputs for balance. Added LTG's to address impairments identified during this session.    Rehab Potential Good   Clinical Impairments Affecting Rehab Potential Positive: age, motivation, supportive husband.  Negative: financial limitation (30 visit limit combined between OT/PT disciplines)   PT Frequency 1x / week   PT Duration 4 weeks   PT Treatment/Interventions ADLs/Self Care Home Management;Vestibular;DME Instruction;Gait training;Neuromuscular re-education;Stair training;Functional mobility training;Cognitive remediation;Therapeutic activities;Patient/family education;Therapeutic exercise;Orthotic Fit/Training;Balance training;Manual techniques;Visual/perceptual remediation/compensation   PT Next Visit Plan Assess current vestibular HEP and progress prn.   Consulted and Agree with Plan of Care Patient      Patient will benefit from skilled therapeutic intervention in order to improve the following deficits and impairments:  Abnormal gait, Decreased cognition, Decreased balance, Decreased strength, Impaired vision/preception, Dizziness  Visit Diagnosis: Other abnormalities of gait and mobility - Plan: PT plan of care cert/re-cert  Unsteadiness on feet - Plan: PT plan of care cert/re-cert     Problem List Patient Active Problem List   Diagnosis Date Noted  . Leukopenia   . Bradycardia   . Acute lower UTI   . Agitation   . Lethargic   . Lethargy   . Bilateral headaches   . Other vascular headache   . Hypokalemia   . Heat rash   . Poor appetite   . Hyponatremia   . Cephalalgia   . Obstructive hydrocephalus 12/17/2015  . 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 Headache 12/17/2015  . IVH  (intraventricular hemorrhage) ([place]) 12/16/2015  . Chest pain 05/10/2015  . Precordial chest pain 05/10/2015  . Chest wall pain 05/10/2015  . Pain in the chest   . Wellness examination 01/24/2015  . Myalgia 11/14/2014  . Muscle cramps 11/14/2014  . Pinched nerve 08/28/2014  . Routine general medical examination at a health care facility 08/12/2013  . Cough 04/21/2012  . Lymphadenitis, acute 10/23/2011  . GERD 08/09/2008  . OTHER DYSPHAGIA 06/18/2008  . 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 NECK DISORDER 05/22/2008  . HEADACHE 08/16/2007  . OTITIS MEDIA, ACUTE, LEFT 08/11/2007  . OTHER ABNORMAL BLOOD CHEMISTRY 07/25/2007  . HYPERLIPIDEMIA 05/17/2007  . Essential hypertension 05/17/2007  . OSTEOARTHRITIS 05/17/2007  . SPINAL STENOSIS 05/17/2007  . History of cardiovascular disorder 05/17/2007    Billie Ruddy, PT, DPT [facility] [address] [place], [name], [address] Phone: [phone]   Fax:  [phone] 02/24/16, 11:32 AM  Name: Kelly Wilson MRN: [id number] Date of Birth: May 24, 1956

## 2016-02-24 NOTE — Patient Instructions (Signed)
Feet Together (Compliant Surface) Head Motion - Eyes Open     Stand with your back to a corner with a stable chair in front of you.  Stand on one pillow with feet together and perform the following:  1. With eyes open, move head slowly: up and down 10 times; right to left 10 times; diagonally up-right to down-left 10 times; and diagonally up-left to down-right 10 times.  2. With eyes closed, move head slowly: up and down 10 times; right to left 10 times; diagonally up-right to down-left 10 times; and diagonally up-left to down-right 10 times.  Repeat _2___ times per day.    Walking Head Turn    Standing close to a wall, walk the length of your hallway while turning head right to left (2 steps with head to right, 2 steps wit head to left). Touch wall if necessary to keep balance. Practice this exercise for 3-5 minutes per day.  Copyright  VHI. All rights reserved.

## 2016-02-24 NOTE — Therapy (Signed)
[facility] [address] [place], [name], [address] Phone: [phone]   Fax:  [phone]  Speech Language Pathology Treatment  Patient Details  Name: Kelly Wilson MRN: [id number] Date of Birth: 1956/03/31 Referring Provider: Delice Lesch M.D.  Encounter Date: 02/24/2016      End of Session - 02/24/16 1115    Visit Number 4   Number of Visits 17   Date for SLP Re-Evaluation 04/17/16   Authorization - Visit Number 4   Authorization - Number of Visits 30   SLP Start Time [id number]   SLP Stop Time  1145   SLP Time Calculation (min) 43 min   Activity Tolerance Patient tolerated treatment well      Past Medical History:  Diagnosis Date  . GERD 08/09/2008  . 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 Hyperlipidemia   . HYPERLIPIDEMIA 05/17/2007  . Hypertension   . HYPERTENSION 05/17/2007  . Left ear hearing loss    Mild  . OSTEOARTHRITIS 05/17/2007  . SPINAL STENOSIS 05/17/2007  . TRANSIENT ISCHEMIC ATTACK, HX OF 05/17/2007    Past Surgical History:  Procedure Laterality Date  . ABDOMINAL HYSTERECTOMY  1995  . ANKLE SURGERY  2005  . CESAREAN SECTION  1993  . CRANIOTOMY Right 12/17/2015   Procedure: Suboccipital CRANIectomy INTRACRANIAL ANEURYSM FOR VERTEBRAL/BASILAR;  Surgeon: Consuella Lose, MD;  Location: [facility];  Service: Neurosurgery;  Laterality: Right;  suboccipital  . EXTERNAL EAR SURGERY     left side x's 2  . KNEE SURGERY  02/2011  . ORIF ELBOW FRACTURE  02/2011  . SKIN GRAFT FULL THICKNESS ARM    . SKIN GRAFT FULL THICKNESS LEG    . STAPEDECTOMY    . throidectomy    . THYROIDECTOMY, PARTIAL Right 1980  . TUBAL LIGATION      There were no vitals filed for this visit.      Subjective Assessment - 02/24/16 1109    Subjective "I'm feeling good. Everything is great. I'm going to work after this."   Currently in Pain? No/denies               ADULT SLP TREATMENT - 02/24/16 1112      General Information   Behavior/Cognition Alert;Cooperative;Pleasant mood     Treatment Provided   Treatment provided Cognitive-Linquistic     Pain Assessment   Pain Assessment No/denies pain     Cognitive-Linquistic Treatment   Treatment focused on Cognition;Aphasia   Skilled Treatment Pt provided with a moderately complex deductive reasoning puzzle which she completed with min A secondary to mild difficulty with deductive reasoning. Pt able to demonstrate self-monitoring with improved awareness of need to self-check and able to identify errors when given increased time. Functional math reasoning problems completed with 75% acc mod I. Pt able to write recipe with 80% acc and increased time for self-correction of errors. Pt only identified 2/4 errors independently and required min A to identify remaining errors.     Assessment / Recommendations / Plan   Plan Continue with current plan of care     Progression Toward Goals   Progression toward goals Progressing toward goals            SLP Short Term Goals - 02/24/16 1118      SLP SHORT TERM GOAL #1   Title pt will demo emergent awareness in mod complex cognitive linguistic tasks 85% of opportunities   Time 2   Period Weeks   Status On-going  SLP SHORT TERM GOAL #2   Title pt will demo attention to detail in mod complex cognitive linguistic tasks to obtain 95% accuracy with self correction allowed   Time 2   Period Weeks   Status On-going     SLP SHORT TERM GOAL #3   Title pt will demo problem solving/reasoning skills in mod complex cognitive linguistic tasks with rare min A   Time 2   Period Weeks   Status On-going     SLP SHORT TERM GOAL #4   Title In written tasks, pt will demo awareness of written errors with min cues, occasionally   Time 2   Period Weeks   Status On-going          SLP Long Term Goals - 02/24/16 1119      SLP LONG TERM GOAL #1   Title pt will demo anticipatory awareness (i.e.,  double-checking more detailed work) in four sessions   Time 6   Period Weeks   Status On-going     SLP LONG TERM GOAL #2   Title  pt will demo attention to detail by achieving 100% success with mod complex-complex cognitive linguistic tasks with compensation (double checking work) in four sessions   Time 6   Period Weeks     SLP LONG TERM GOAL #3   Title In mod complex cognitive linguistic tasks, pt will show divided attention 80% of the time, with 90% accuracy on both tasks over two sessions   Time 6   Period Weeks   Status On-going     SLP LONG TERM GOAL #4   Title in written tasks, pt will correct written aphasia errors independently   Time 6   Status On-going          Plan - 02/24/16 1116    Clinical Impression Statement Pt reports today that she has resumed driving and is going back to work today. Pt would continue to benefit from SLP services to maximize cognitive-linguistic skills for independent high-level functioning.   Speech Therapy Frequency 2x / week   Duration --  7 weeks   Treatment/Interventions Cognitive reorganization;SLP instruction and feedback;Compensatory strategies;Internal/external aids;Patient/family education;Functional tasks;Cueing hierarchy   Potential to Achieve Goals Good      Patient will benefit from skilled therapeutic intervention in order to improve the following deficits and impairments:   Cognitive communication deficit    Problem List Patient Active Problem List   Diagnosis Date Noted  . Leukopenia   . Bradycardia   . Acute lower UTI   . Agitation   . Lethargic   . Lethargy   . Bilateral headaches   . Other vascular headache   . Hypokalemia   . Heat rash   . Poor appetite   . Hyponatremia   . Cephalalgia   . Obstructive hydrocephalus 12/17/2015  . 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 Headache 12/17/2015  . IVH (intraventricular hemorrhage) ([place]) 12/16/2015  . Chest pain 05/10/2015  . Precordial  chest pain 05/10/2015  . Chest wall pain 05/10/2015  . Pain in the chest   . Wellness examination 01/24/2015  . Myalgia 11/14/2014  . Muscle cramps 11/14/2014  . Pinched nerve 08/28/2014  . Routine general medical examination at a health care facility 08/12/2013  . Cough 04/21/2012  . Lymphadenitis, acute 10/23/2011  . GERD 08/09/2008  . OTHER DYSPHAGIA 06/18/2008  . 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 NECK DISORDER 05/22/2008  . HEADACHE 08/16/2007  . OTITIS MEDIA, ACUTE, LEFT 08/11/2007  .  OTHER ABNORMAL BLOOD CHEMISTRY 07/25/2007  . HYPERLIPIDEMIA 05/17/2007  . Essential hypertension 05/17/2007  . OSTEOARTHRITIS 05/17/2007  . SPINAL STENOSIS 05/17/2007  . History of cardiovascular disorder 05/17/2007    Vinetta Bergamo MA, CCC-SLP 02/24/2016, 3:56 PM  [facility] [address] [place], [name], [address] Phone: [phone]   Fax:  [phone]   Name: Kelly Wilson MRN: [id number] Date of Birth: 1956-05-13

## 2016-02-24 NOTE — Telephone Encounter (Signed)
She was discharged from the hospital on 01/15/16.  Since then she has seen Neurosurg and stated they were managing her seizure prophylaxis, as such it was removed from my note.  From my understanding, Neurosurgery is taking this over. Thanks.

## 2016-02-25 ENCOUNTER — Telehealth: Payer: Self-pay | Admitting: Physical Medicine & Rehabilitation

## 2016-02-25 ENCOUNTER — Encounter: Payer: Self-pay | Admitting: Physical Medicine & Rehabilitation

## 2016-02-25 NOTE — Telephone Encounter (Signed)
I spoke with Kelly Wilson and she says you did discuss and said she could return doing whatever required.  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  There is a return to work note under communications tab [phone]) that can be filled out on our letterhead.

## 2016-02-25 NOTE — Telephone Encounter (Signed)
There must have been some miscommunication.  I will write a letter based on what I think is appropriate.

## 2016-02-25 NOTE — Telephone Encounter (Signed)
We did not discuss returning to work and I am not sure what she does regarding work.  The discussion we had, which she insisted she return to the office for, was for consideration for driving, which I cleared her for with graduated independence.

## 2016-02-25 NOTE — Telephone Encounter (Signed)
Patient needs to get a return to work note.  Dr. Posey Pronto had told her she could return to work, but her job needs a Quarry manager from doctor.  Any questions please call her at [phone].

## 2016-02-26 ENCOUNTER — Telehealth: Payer: Self-pay | Admitting: Physical Medicine & Rehabilitation

## 2016-02-26 NOTE — Telephone Encounter (Signed)
I spoke with Kelly Wilson about the letter.  I will forward a copy through myChart and then mail the signed copy.

## 2016-02-26 NOTE — Telephone Encounter (Signed)
Can you please ask the patient what exactly she does for work, what exactly she needs in a note and I will write it based on my medical recommendations.  Thank you.

## 2016-02-26 NOTE — Telephone Encounter (Signed)
Letter that Dr. Posey Pronto wrote still not address what patient's employment is looking for.  She is saying it needs to read can she work a full day, or can she leave if she is tired or has appointments for therapy.  Please call patient.

## 2016-02-27 ENCOUNTER — Encounter: Payer: BLUE CROSS/BLUE SHIELD | Admitting: Occupational Therapy

## 2016-02-27 ENCOUNTER — Ambulatory Visit: Payer: BLUE CROSS/BLUE SHIELD

## 2016-03-02 ENCOUNTER — Ambulatory Visit: Payer: BLUE CROSS/BLUE SHIELD

## 2016-03-02 ENCOUNTER — Encounter: Payer: BLUE CROSS/BLUE SHIELD | Admitting: Occupational Therapy

## 2016-03-02 ENCOUNTER — Ambulatory Visit: Payer: BLUE CROSS/BLUE SHIELD | Admitting: Physical Therapy

## 2016-03-02 DIAGNOSIS — R2689 Other abnormalities of gait and mobility: Secondary | ICD-10-CM

## 2016-03-02 DIAGNOSIS — R2681 Unsteadiness on feet: Secondary | ICD-10-CM

## 2016-03-02 DIAGNOSIS — R488 Other symbolic dysfunctions: Secondary | ICD-10-CM

## 2016-03-02 DIAGNOSIS — R41841 Cognitive communication deficit: Secondary | ICD-10-CM

## 2016-03-02 NOTE — Therapy (Signed)
[facility] [address] [place], [name], [address] Phone: [phone]   Fax:  [phone]  Physical Therapy Treatment  Patient Details  Name: Kelly Wilson MRN: [id number] Date of Birth: 31-Jul-1955 Referring Provider: Jamse Arn, MD  Encounter Date: 03/02/2016      PT End of Session - 03/02/16 1315    Visit Number 9   Number of Visits 13   Date for PT Re-Evaluation 03/25/16   Authorization Type [employer] - 30 visit limit (PT, OT combined)   Authorization - Visit Number 8   Authorization - Number of Visits 30   PT Start Time 0930   PT Stop Time 1015   PT Time Calculation (min) 45 min   Equipment Utilized During Treatment Gait belt   Activity Tolerance Patient tolerated treatment well   Behavior During Therapy [facility] for tasks assessed/performed      Past Medical History:  Diagnosis Date  . GERD 08/09/2008  . 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 Hyperlipidemia   . HYPERLIPIDEMIA 05/17/2007  . Hypertension   . HYPERTENSION 05/17/2007  . Left ear hearing loss    Mild  . OSTEOARTHRITIS 05/17/2007  . SPINAL STENOSIS 05/17/2007  . TRANSIENT ISCHEMIC ATTACK, HX OF 05/17/2007    Past Surgical History:  Procedure Laterality Date  . ABDOMINAL HYSTERECTOMY  1995  . ANKLE SURGERY  2005  . CESAREAN SECTION  1993  . CRANIOTOMY Right 12/17/2015   Procedure: Suboccipital CRANIectomy INTRACRANIAL ANEURYSM FOR VERTEBRAL/BASILAR;  Surgeon: Consuella Lose, MD;  Location: [facility];  Service: Neurosurgery;  Laterality: Right;  suboccipital  . EXTERNAL EAR SURGERY     left side x's 2  . KNEE SURGERY  02/2011  . ORIF ELBOW FRACTURE  02/2011  . SKIN GRAFT FULL THICKNESS ARM    . SKIN GRAFT FULL THICKNESS LEG    . STAPEDECTOMY    . throidectomy    . THYROIDECTOMY, PARTIAL Right 1980  . TUBAL LIGATION      There were no vitals filed for this visit.      Subjective Assessment - 03/02/16 0932    Subjective "I  brought my cane today because it's raining outside." Pt with many questions regarding who Dr. Erlinda Hong is, if she has seen him before. Upon further explanation, pt states, "There are a lot of things from the month of June that I don't remember. Apparently I was very mean to my husband in the hospital."   Pertinent History Likes to be called "Kelly Wilson".    PMH significant for: HTN, TIA   Patient Stated Goals "I want to get rid of that rolling walker; and to be able to walk around my house without feeling like I have to hold onto something."   Currently in Pain? No/denies                              Balance Exercises - 03/02/16 1312      Balance Exercises: Standing   Standing Eyes Opened Narrow base of support (BOS);Head turns;Foam/compliant surface;Other reps (comment)  2 pillows; horiz, vert, diagonal turns x10 each   Standing Eyes Closed Narrow base of support (BOS);Foam/compliant surface;Other reps (comment)  2 pillows; horiz, vert, diagonal turns x10 each   Stepping Strategy Anterior;Posterior;Foam/compliant surface;10 reps  blue foam beam; forward/retro x10 reps per LE   Step Ups Forward;Lateral;6 inch;Intermittent UE support  x8 each; min guard with lateral step-ups (B directions)  Gait with Head Turns Forward;Intermittent upper extremity support;2 reps;Other (comment)  2 x50' with horiz, vertical head turns   Tandem Gait Forward;Intermittent upper extremity support;4 reps;Other (comment)  4 x10 reps   Other Standing Exercises turning over cones then replacing into upright position 5 x4 reps per LE with (S) when using LLE, min guard when using RLE           PT Education - 03/02/16 1015    Education provided Yes   Education Details HEP progressed; see Pt Instructions. Per pt questions, provided extensive education on brain injury, the stages of recovery, cerebral aneurysm, and what to expect when seeing neurologist on 8/23.   Person(s) Educated Patient   Methods  Demonstration;Explanation;Handout;Verbal cues   Comprehension Verbalized understanding;Returned demonstration          PT Short Term Goals - 02/17/16 1416      PT SHORT TERM GOAL #1   Title Pt will perform initial HEP with mod I using handout to maximize functional gains made in PT.  (Target date: 02/19/16)   Baseline Met 7/24.   Status Achieved     PT SHORT TERM GOAL #2   Title Pt will improve gait velocity (without AD) from 1.89 ft/sec to >/= 2.19 ft/sec to indicate increased efficiency of mobility. (02/19/16)   Baseline Met 7/31 with gait velocity of 3.19 ft/sec.   Status Achieved     PT SHORT TERM GOAL #3   Title Pt will improve DGI from 7/24 to >/= 11/24 to indicate improved dynamic gait stability.  (02/19/16)   Baseline Met 7/31 with score of 17/24.   Status Achieved     PT SHORT TERM GOAL #4   Title Complete Berg and improve score by 4 points from baseline to progress toward significant improvement in standing balance. (02/19/16)   Baseline 46/56 on 01/27/16;  54/56 on 7/31.   Status Achieved     PT SHORT TERM GOAL #5   Title Pt will ambulate 54' over level, indoor surfaces without AD with no overt LOB to indicate progress toward independence with household mobility. (02/19/16)   Baseline Met 7/24.   Status Achieved     PT SHORT TERM GOAL #6   Title Pt will ambulate 200' over unlevel, paved surfaces with mod I using LRAD to progress toward safety with commnuity mobility. (02/19/16)   Baseline Met 7/31.   Status Achieved     PT SHORT TERM GOAL #7   Title Pt will negotiate 2 stairs without rails with mod I using LRAD to indicate increased safety using primary home entrance.  (02/19/16)   Baseline Met 7/31.   Status Achieved           PT Long Term Goals - 02/24/16 1109      PT LONG TERM GOAL #1   Title Pt will improve Berg score by 8 points from baseline to indicate significant improvement in dynamic standing balance.  (03/18/16)   Baseline Met 7/31.   Status Achieved      PT LONG TERM GOAL #2   Title Pt will improve gait velocity (without AD) from 1.89 ft/sec to >/= 2.49 ft/sec to indicate significantly increased efficiency of ambulation. (03/18/16)   Baseline Met 7/31 with gait velocity of 3.19 ft/sec.   Status Achieved     PT LONG TERM GOAL #3   Title Pt will improve DGI from 7/24 to > 20/24 to indicate decreased fall risk.  (03/18/16)   Baseline Met 8/7, with score of 22/24.  Status Achieved     PT LONG TERM GOAL #4   Title Pt will independently ambulate 200' over level, indoor surfaces with effective obstacle negotiation to indicate independence with household ambulaton. (03/18/16)   Baseline Met 7/31.   Status Achieved     PT LONG TERM GOAL #5   Title Pt will independently ambulate 1,000' over unlevel, paved and grass surfaces with no overt LOB to indicate increased safety with community mobility.  (03/18/16)   Status On-going     Additional Long Term Goals   Additional Long Term Goals Yes     PT LONG TERM GOAL #6   Title Pt will improve FGA score from 24 to 28/30 to indicate significant improvement in dynamic gait.  (Target date: 03/18/16)     PT LONG TERM GOAL #7   Title Pt will improve SOT composite score from 56% to 64% to indicate significant improvement in use of multi-sensory input for balance.  (03/18/16)     PT LONG TERM GOAL #8   Title Pt will improve SOT Vestibular score from 0% to >/= 25% to indicate significant improvement in use of vestibular input for balance.  (03/18/16)               Plan - 03/02/16 1316    Clinical Impression Statement Session focused on continuing to progress HEP for use of visual/vestibular input for balance. Much of this session was spent providing education on the following to address pt questions: brain injury, stages of healing/recovery, aneurysm, and what to expect at upcoming neurologist appt.   Rehab Potential Good   Clinical Impairments Affecting Rehab Potential Positive: age, motivation,  supportive husband.  Negative: financial limitation (30 visit limit combined between OT/PT disciplines)   PT Frequency 1x / week   PT Duration 4 weeks   PT Treatment/Interventions ADLs/Self Care Home Management;Vestibular;DME Instruction;Gait training;Neuromuscular re-education;Stair training;Functional mobility training;Cognitive remediation;Therapeutic activities;Patient/family education;Therapeutic exercise;Orthotic Fit/Training;Balance training;Manual techniques;Visual/perceptual remediation/compensation   PT Next Visit Plan Assess current vestibular HEP and progress prn.   Consulted and Agree with Plan of Care Patient      Patient will benefit from skilled therapeutic intervention in order to improve the following deficits and impairments:  Abnormal gait, Decreased cognition, Decreased balance, Decreased strength, Impaired vision/preception, Dizziness  Visit Diagnosis: Other abnormalities of gait and mobility  Unsteadiness on feet     Problem List Patient Active Problem List   Diagnosis Date Noted  . Leukopenia   . Bradycardia   . Acute lower UTI   . Agitation   . Lethargic   . Lethargy   . Bilateral headaches   . Other vascular headache   . Hypokalemia   . Heat rash   . Poor appetite   . Hyponatremia   . Cephalalgia   . Obstructive hydrocephalus 12/17/2015  . 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[place]) 12/17/2015  . Headache 12/17/2015  . IVH (intraventricular hemorrhage) ([place]) 12/16/2015  . Chest pain 05/10/2015  . Precordial chest pain 05/10/2015  . Chest wall pain 05/10/2015  . Pain in the chest   . Wellness examination 01/24/2015  . Myalgia 11/14/2014  . Muscle cramps 11/14/2014  . Pinched nerve 08/28/2014  . Routine general medical examination at a health care facility 08/12/2013  . Cough 04/21/2012  . Lymphadenitis, acute 10/23/2011  . GERD 08/09/2008  . OTHER DYSPHAGIA 06/18/2008  . 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 NECK DISORDER 05/22/2008  .  HEADACHE 08/16/2007  . OTITIS MEDIA, ACUTE, LEFT 08/11/2007  . OTHER ABNORMAL BLOOD  CHEMISTRY 07/25/2007  . HYPERLIPIDEMIA 05/17/2007  . Essential hypertension 05/17/2007  . OSTEOARTHRITIS 05/17/2007  . SPINAL STENOSIS 05/17/2007  . History of cardiovascular disorder 05/17/2007   Billie Ruddy, PT, DPT [facility] [address] [place], [name], [address] Phone: [phone]   Fax:  [phone] 03/02/16, 1:18 PM  Name: Kelly Wilson MRN: [id number] Date of Birth: [birth year]

## 2016-03-02 NOTE — Patient Instructions (Addendum)
Feet Together (Compliant Surface) Head Motion - Eyes Open     Stand with your back to a corner with a stable chair in front of you.  Stand on two pillows with feet together and perform the following:  1. With eyes open, move head slowly: up and down 10 times; right to left 10 times.  2. With eyes closed, move head slowly: up and down 10 times; right to left 10 times.  Repeat _2___ times per day.    Walking Head Turn    Standing close to a wall, walk the length of your hallway while turning head right to left (2 steps with head to right, 2 steps with head to left)l then walk length of hallway while turning head up and down (2 steps with head up, 2 steps with head down). Touch wall if necessary to keep balance.  Practice this exercise for 3-5 minutes per day.  Copyright  VHI. All rights reserved.

## 2016-03-02 NOTE — Therapy (Signed)
[facility] [address] [place], [name], [address] Phone: [phone]   Fax:  [phone]  Speech Language Pathology Treatment  Patient Details  Name: Kelly Wilson MRN: [id number] Date of Birth: 12-Mar-1956 Referring Provider: Delice Lesch M.D.  Encounter Date: 03/02/2016      End of Session - 03/02/16 1245    Visit Number 5   Number of Visits 17   Date for SLP Re-Evaluation 04/17/16   Authorization - Visit Number 5   Authorization - Number of Visits 30   SLP Start Time [id number]   SLP Stop Time  1148   SLP Time Calculation (min) 43 min   Activity Tolerance Patient tolerated treatment well      Past Medical History:  Diagnosis Date  . GERD 08/09/2008  . 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 Hyperlipidemia   . HYPERLIPIDEMIA 05/17/2007  . Hypertension   . HYPERTENSION 05/17/2007  . Left ear hearing loss    Mild  . OSTEOARTHRITIS 05/17/2007  . SPINAL STENOSIS 05/17/2007  . TRANSIENT ISCHEMIC ATTACK, HX OF 05/17/2007    Past Surgical History:  Procedure Laterality Date  . ABDOMINAL HYSTERECTOMY  1995  . ANKLE SURGERY  2005  . CESAREAN SECTION  1993  . CRANIOTOMY Right 12/17/2015   Procedure: Suboccipital CRANIectomy INTRACRANIAL ANEURYSM FOR VERTEBRAL/BASILAR;  Surgeon: Consuella Lose, MD;  Location: [facility];  Service: Neurosurgery;  Laterality: Right;  suboccipital  . EXTERNAL EAR SURGERY     left side x's 2  . KNEE SURGERY  02/2011  . ORIF ELBOW FRACTURE  02/2011  . SKIN GRAFT FULL THICKNESS ARM    . SKIN GRAFT FULL THICKNESS LEG    . STAPEDECTOMY    . throidectomy    . THYROIDECTOMY, PARTIAL Right 1980  . TUBAL LIGATION      There were no vitals filed for this visit.      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 SLP gave pt mod complex novel detailed reading/reasoning task (warranty coverage). Pt req'd extra time consistently, with reduced emergent awareness (1/4 errors were addressed).  Additionally deficits in organization/reasoning were seen as pt occasionally req'd cues for where to look to answer questions. Pt had difficulty with anticipatory awareness likely due to decr'd reasoning re: what tasks she could theoretically engage in at work that may have novel changes (update to software, or hardware, updated processes for bills/collection, different format for bulletin, etc). SLP had to provide total A for pt to realize she may need to double check her work or have someone else check her work.     Assessment / Recommendations / Plan   Plan Continue with current plan of care     Progression Toward Goals   Progression toward goals Not progressing toward goals (comment)  Severity of deficit, limited awareness hinders progress          SLP Education - 03/02/16 1244  Education provided Yes   Education Details deficit areas, ramifications on pt's performance due to deficit areas   Person(s) Educated Patient   Methods Demonstration   Comprehension Verbalized understanding;Need further instruction          SLP Short Term Goals - 03/02/16 1254      SLP SHORT TERM GOAL #1   Title pt will demo emergent awareness in mod complex cognitive linguistic tasks 85% of opportunities   Time 2   Period Weeks   Status On-going     SLP SHORT TERM GOAL #2   Title pt will demo attention to detail in mod complex  cognitive linguistic tasks to obtain 95% accuracy with self correction allowed   Time 2   Period Weeks   Status On-going     SLP SHORT TERM GOAL #3   Title pt will demo problem solving/reasoning skills in mod complex cognitive linguistic tasks with rare min A   Time 2   Period Weeks   Status On-going     SLP SHORT TERM GOAL #4   Title In written tasks, pt will demo awareness of written errors with min cues, occasionally   Time 2   Period Weeks   Status On-going          SLP Long Term Goals - 03/02/16 1254      SLP LONG TERM GOAL #1   Title pt will demo anticipatory awareness (i.e., double-checking more detailed work) in four sessions   Time 6   Period Weeks   Status On-going     SLP LONG TERM GOAL #2   Title  pt will demo attention to detail by achieving 100% success with mod complex-complex cognitive linguistic tasks with compensation (double checking work) in four sessions   Time 6   Period Weeks     SLP LONG TERM GOAL #3   Title In mod complex cognitive linguistic tasks, pt will show divided attention 80% of the time, with 90% accuracy on both tasks over two sessions   Time 6   Period Weeks   Status On-going     SLP LONG TERM GOAL #4   Title in written tasks, pt will correct written aphasia errors independently   Time 6   Status On-going          Plan - 03/02/16 1246    Clinical Impression Statement Pt reported last week that she has resumed driving and has returned back to work. SLP believes pt should only drive with supervision in parking lots or on the road at "dead times" (ex: 11-10-2022 mornings), as pt's awareness, reasoning, and attention to detail continue as moderately impaired. SLP has recommended someone check behind patient on all tasks pt completes at work. Pt would continue to benefit from SLP services to maximize cognitive-linguistic skills for independent functioning.   Speech Therapy Frequency 2x / week   Duration --  6 weeks    Treatment/Interventions Cognitive reorganization;SLP instruction and feedback;Compensatory strategies;Internal/external aids;Patient/family education;Functional tasks;Cueing hierarchy   Potential to Achieve Goals Good   Potential Considerations Severity of impairments  awareness and attention   Consulted and Agree with Plan of Care Patient      Patient will benefit from skilled therapeutic intervention in order to improve the following deficits and impairments:   Cognitive communication deficit  Aphasic agraphia    Problem List Patient Active Problem List   Diagnosis Date Noted  . Leukopenia   . Bradycardia   . Acute lower UTI   .  Agitation   . Lethargic   . Lethargy   . Bilateral headaches   . Other vascular headache   . Hypokalemia   . Heat rash   . Poor appetite   . Hyponatremia   . Cephalalgia   . Obstructive hydrocephalus 12/17/2015  . 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[place]) 12/17/2015  . Headache 12/17/2015  . IVH (intraventricular hemorrhage) ([place]) 12/16/2015  . Chest pain 05/10/2015  . Precordial chest pain 05/10/2015  . Chest wall pain 05/10/2015  . Pain in the chest   . Wellness examination 01/24/2015  . Myalgia 11/14/2014  . Muscle cramps 11/14/2014  . Pinched nerve 08/28/2014  . Routine general medical examination at a health care facility 08/12/2013  . Cough 04/21/2012  . Lymphadenitis, acute 10/23/2011  . GERD 08/09/2008  . OTHER DYSPHAGIA 06/18/2008  . 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 NECK DISORDER 05/22/2008  . HEADACHE 08/16/2007  . OTITIS MEDIA, ACUTE, LEFT 08/11/2007  . OTHER ABNORMAL BLOOD CHEMISTRY 07/25/2007  . HYPERLIPIDEMIA 05/17/2007  . Essential hypertension 05/17/2007  . OSTEOARTHRITIS 05/17/2007  . SPINAL STENOSIS 05/17/2007  . History of cardiovascular disorder 05/17/2007    [facility] ,[place], [place]  03/02/2016, 12:55 PM  [facility] [address] [place], [name], [address] Phone: [phone]   Fax:  [phone]   Name: Kelly Wilson MRN: [id number] Date of Birth: 04-04-1956

## 2016-03-02 NOTE — Patient Instructions (Signed)
You must have someone check any work you do at [employer], based upon your difficulties on therapy tasks thus far.

## 2016-03-04 ENCOUNTER — Ambulatory Visit: Payer: BLUE CROSS/BLUE SHIELD

## 2016-03-10 ENCOUNTER — Ambulatory Visit: Payer: BLUE CROSS/BLUE SHIELD | Admitting: Physical Therapy

## 2016-03-10 ENCOUNTER — Ambulatory Visit: Payer: BLUE CROSS/BLUE SHIELD

## 2016-03-10 ENCOUNTER — Encounter: Payer: BLUE CROSS/BLUE SHIELD | Admitting: Occupational Therapy

## 2016-03-10 DIAGNOSIS — R2689 Other abnormalities of gait and mobility: Secondary | ICD-10-CM

## 2016-03-10 DIAGNOSIS — R41841 Cognitive communication deficit: Secondary | ICD-10-CM

## 2016-03-10 DIAGNOSIS — R2681 Unsteadiness on feet: Secondary | ICD-10-CM

## 2016-03-10 DIAGNOSIS — R488 Other symbolic dysfunctions: Secondary | ICD-10-CM

## 2016-03-10 NOTE — Patient Instructions (Signed)
  Please complete the assigned speech therapy homework and return it to your next session. (plan a 3-day vacation for you and a friend - at least lodging, food, packing list)

## 2016-03-10 NOTE — Therapy (Signed)
[facility] [address] [place], [name], [address] Phone: [phone]   Fax:  [phone]  Physical Therapy Treatment  Patient Details  Name: Kelly Wilson MRN: [id number] Date of Birth: [birth year] Referring Provider: Jamse Arn, MD  Encounter Date: 03/10/2016      PT End of Session - 03/10/16 1633    Visit Number 10   Number of Visits 13   Date for PT Re-Evaluation 03/25/16   Authorization Type [employer] - 30 visit limit (PT, OT combined)   Authorization - Visit Number 9   Authorization - Number of Visits 30   PT Start Time 1324   PT Stop Time 4010  Session ended early due to no further treatment indicated today   PT Time Calculation (min) 29 min   Activity Tolerance Patient tolerated treatment well   Behavior During Therapy [facility] for tasks assessed/performed      Past Medical History:  Diagnosis Date  . GERD 08/09/2008  . 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 Hyperlipidemia   . HYPERLIPIDEMIA 05/17/2007  . Hypertension   . HYPERTENSION 05/17/2007  . Left ear hearing loss    Mild  . OSTEOARTHRITIS 05/17/2007  . SPINAL STENOSIS 05/17/2007  . TRANSIENT ISCHEMIC ATTACK, HX OF 05/17/2007    Past Surgical History:  Procedure Laterality Date  . ABDOMINAL HYSTERECTOMY  1995  . ANKLE SURGERY  2005  . CESAREAN SECTION  1993  . CRANIOTOMY Right 12/17/2015   Procedure: Suboccipital CRANIectomy INTRACRANIAL ANEURYSM FOR VERTEBRAL/BASILAR;  Surgeon: Consuella Lose, MD;  Location: [facility];  Service: Neurosurgery;  Laterality: Right;  suboccipital  . EXTERNAL EAR SURGERY     left side x's 2  . KNEE SURGERY  02/2011  . ORIF ELBOW FRACTURE  02/2011  . SKIN GRAFT FULL THICKNESS ARM    . SKIN GRAFT FULL THICKNESS LEG    . STAPEDECTOMY    . throidectomy    . THYROIDECTOMY, PARTIAL Right 1980  . TUBAL LIGATION      There were no vitals filed for this visit.      Subjective Assessment - 03/10/16 1627    Subjective Pt arrived without cane. Reports feeling "almost back to myself", in terms of functional mobility. Reports ongoing feeling off-balance when walking backwards.    Pertinent History Likes to be called "Kelly Wilson".    PMH significant for: HTN, TIA   Patient Stated Goals "I want to get rid of that rolling walker; and to be able to walk around my house without feeling like I have to hold onto something."   Currently in Pain? No/denies            [facility] PT Assessment - 03/10/16 0001      Functional Gait  Assessment   Gait assessed  Yes   Gait Level Surface Walks 20 ft in less than 5.5 sec, no assistive devices, good speed, no evidence for imbalance, normal gait pattern, deviates no more than 6 in outside of the 12 in walkway width.   Change in Gait Speed Able to smoothly change walking speed without loss of balance or gait deviation. Deviate no more than 6 in outside of the 12 in walkway width.   Gait with Horizontal Head Turns Performs head turns smoothly with no change in gait. Deviates no more than 6 in outside 12 in walkway width   Gait with Vertical Head Turns Performs task with slight change in gait velocity (eg, minor disruption to smooth gait path), deviates 6 -  10 in outside 12 in walkway width or uses assistive device   Gait and Pivot Turn Pivot turns safely within 3 sec and stops quickly with no loss of balance.   Step Over Obstacle Is able to step over one shoe box (4.5 in total height) without changing gait speed. No evidence of imbalance.   Gait with Narrow Base of Support Ambulates 4-7 steps.  6 steps   Gait with Eyes Closed Walks 20 ft, no assistive devices, good speed, no evidence of imbalance, normal gait pattern, deviates no more than 6 in outside 12 in walkway width. Ambulates 20 ft in less than 7 sec.   Ambulating Backwards Walks 20 ft, uses assistive device, slower speed, mild gait deviations, deviates 6-10 in outside 12 in walkway width.   Steps Alternating feet, no  rail.   Total Score 25                          Balance Exercises - 03/10/16 1630      Balance Exercises: Standing   Standing Eyes Opened Narrow base of support (BOS);Head turns;Foam/compliant surface;Other reps (comment)  2 pillow; horiz, vertical, diagonal turns x10; no difficulty   Standing Eyes Closed Narrow base of support (BOS);Head turns;Foam/compliant surface;Other reps (comment)  2 pillow; horiz, vertical, diagonal head turns x10 each   Wall Bumps Hip   Wall Bumps-Hips Eyes opened;Eyes closed;Anterior/posterior;Foam/compliant surface;20 reps  pillow; x10 reps EO, x10 reps EC   Tandem Gait Forward;Retro;Intermittent upper extremity support;2 reps  2 trials x10' per direction   Retro Gait 4 reps  4 x10'; slow speed   Other Standing Exercises Semi tandem on declined ramp: horizontal, vertical head turns x10 reps each with each LE leading.           PT Education - 03/10/16 1628    Education provided Yes   Education Details HEP modified/progressed; see Pt Instructions. Discussed plan for DC at next session.    Person(s) Educated Patient   Methods Explanation   Comprehension Verbalized understanding          PT Short Term Goals - 02/17/16 1416      PT SHORT TERM GOAL #1   Title Pt will perform initial HEP with mod I using handout to maximize functional gains made in PT.  (Target date: 02/19/16)   Baseline Met 7/24.   Status Achieved     PT SHORT TERM GOAL #2   Title Pt will improve gait velocity (without AD) from 1.89 ft/sec to >/= 2.19 ft/sec to indicate increased efficiency of mobility. (02/19/16)   Baseline Met 7/31 with gait velocity of 3.19 ft/sec.   Status Achieved     PT SHORT TERM GOAL #3   Title Pt will improve DGI from 7/24 to >/= 11/24 to indicate improved dynamic gait stability.  (02/19/16)   Baseline Met 7/31 with score of 17/24.   Status Achieved     PT SHORT TERM GOAL #4   Title Complete Berg and improve score by 4 points from  baseline to progress toward significant improvement in standing balance. (02/19/16)   Baseline 46/56 on 01/27/16;  54/56 on 7/31.   Status Achieved     PT SHORT TERM GOAL #5   Title Pt will ambulate 59' over level, indoor surfaces without AD with no overt LOB to indicate progress toward independence with household mobility. (02/19/16)   Baseline Met 7/24.   Status Achieved     PT SHORT TERM GOAL #  6   Title Pt will ambulate 200' over unlevel, paved surfaces with mod I using LRAD to progress toward safety with commnuity mobility. (02/19/16)   Baseline Met 7/31.   Status Achieved     PT SHORT TERM GOAL #7   Title Pt will negotiate 2 stairs without rails with mod I using LRAD to indicate increased safety using primary home entrance.  (02/19/16)   Baseline Met 7/31.   Status Achieved           PT Long Term Goals - 02/24/16 1109      PT LONG TERM GOAL #1   Title Pt will improve Berg score by 8 points from baseline to indicate significant improvement in dynamic standing balance.  (03/18/16)   Baseline Met 7/31.   Status Achieved     PT LONG TERM GOAL #2   Title Pt will improve gait velocity (without AD) from 1.89 ft/sec to >/= 2.49 ft/sec to indicate significantly increased efficiency of ambulation. (03/18/16)   Baseline Met 7/31 with gait velocity of 3.19 ft/sec.   Status Achieved     PT LONG TERM GOAL #3   Title Pt will improve DGI from 7/24 to > 20/24 to indicate decreased fall risk.  (03/18/16)   Baseline Met 8/7, with score of 22/24.   Status Achieved     PT LONG TERM GOAL #4   Title Pt will independently ambulate 200' over level, indoor surfaces with effective obstacle negotiation to indicate independence with household ambulaton. (03/18/16)   Baseline Met 7/31.   Status Achieved     PT LONG TERM GOAL #5   Title Pt will independently ambulate 1,000' over unlevel, paved and grass surfaces with no overt LOB to indicate increased safety with community mobility.  (03/18/16)   Status  On-going     Additional Long Term Goals   Additional Long Term Goals Yes     PT LONG TERM GOAL #6   Title Pt will improve FGA score from 24 to 28/30 to indicate significant improvement in dynamic gait.  (Target date: 03/18/16)     PT LONG TERM GOAL #7   Title Pt will improve SOT composite score from 56% to 64% to indicate significant improvement in use of multi-sensory input for balance.  (03/18/16)     PT LONG TERM GOAL #8   Title Pt will improve SOT Vestibular score from 0% to >/= 25% to indicate significant improvement in use of vestibular input for balance.  (03/18/16)               Plan - 03/10/16 1637    Clinical Impression Statement Session focused on updating HEP based on continued dynamic gait impairments: retro gait, tandem gait, gait with functional head turns. Pt tolerated interventions well. Plan to DC at next session; pt in full agreement.    Rehab Potential Good   Clinical Impairments Affecting Rehab Potential Positive: age, motivation, supportive husband.  Negative: financial limitation (30 visit limit combined between OT/PT disciplines)   PT Frequency 1x / week   PT Duration 4 weeks   PT Treatment/Interventions ADLs/Self Care Home Management;Vestibular;DME Instruction;Gait training;Neuromuscular re-education;Stair training;Functional mobility training;Cognitive remediation;Therapeutic activities;Patient/family education;Therapeutic exercise;Orthotic Fit/Training;Balance training;Manual techniques;Visual/perceptual remediation/compensation   PT Next Visit Plan Check goals and DC. FOTO.   Consulted and Agree with Plan of Care Patient      Patient will benefit from skilled therapeutic intervention in order to improve the following deficits and impairments:  Abnormal gait, Decreased cognition, Decreased balance, Decreased strength, Impaired  vision/preception, Dizziness  Visit Diagnosis: Other abnormalities of gait and mobility  Unsteadiness on feet     Problem  List Patient Active Problem List   Diagnosis Date Noted  . Leukopenia   . Bradycardia   . Acute lower UTI   . Agitation   . Lethargic   . Lethargy   . Bilateral headaches   . Other vascular headache   . Hypokalemia   . Heat rash   . Poor appetite   . Hyponatremia   . Cephalalgia   . Obstructive hydrocephalus 12/17/2015  . 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 Headache 12/17/2015  . IVH (intraventricular hemorrhage) ([place]) 12/16/2015  . Chest pain 05/10/2015  . Precordial chest pain 05/10/2015  . Chest wall pain 05/10/2015  . Pain in the chest   . Wellness examination 01/24/2015  . Myalgia 11/14/2014  . Muscle cramps 11/14/2014  . Pinched nerve 08/28/2014  . Routine general medical examination at a health care facility 08/12/2013  . Cough 04/21/2012  . Lymphadenitis, acute 10/23/2011  . GERD 08/09/2008  . OTHER DYSPHAGIA 06/18/2008  . 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 NECK DISORDER 05/22/2008  . HEADACHE 08/16/2007  . OTITIS MEDIA, ACUTE, LEFT 08/11/2007  . OTHER ABNORMAL BLOOD CHEMISTRY 07/25/2007  . HYPERLIPIDEMIA 05/17/2007  . Essential hypertension 05/17/2007  . OSTEOARTHRITIS 05/17/2007  . SPINAL STENOSIS 05/17/2007  . History of cardiovascular disorder 05/17/2007    Billie Ruddy, PT, DPT [facility] [address] [place], [name], [address] Phone: [phone]   Fax:  [phone] 03/10/16, 4:40 PM  Name: Kelly Wilson MRN: [id number] Date of Birth: [birth year]

## 2016-03-10 NOTE — Patient Instructions (Addendum)
Feet Together (Compliant Surface) Head Motion - Eyes Open     Stand with your back to a corner with a stable chair in front of you. Stand on two pillowswith feet together and eyes closed,move head slowly: up and down 10 times; right to left 10 times.  Repeat _2___ times per day.    Walking Head Turn    Standing close to a wall, walk the length of your hallway while turning head right to left (2 steps with head to right, 2 steps with head to left) then walk length of hallway while turning head up and down (2 steps with head up, 2 steps with head down). Touch wall if necessary to keep balance.  Practice this exercise for 3-5 minutes per day.   Feet Heel-Toe "Tandem"    While standing close to your countertop with your hand close to the counter (but not touching), walk in straight line bringing one foot directly in front of the other. Hold each position for 2 seconds. Repeat for __3__ minutes per session. Do _1-2___ sessions per day.   AMBULATION: Walk Backward    Walk backward along your countertop with hand close to counter. Practice for 2 minutes, _1-2_ times per day.   Copyright  VHI. All rights reserved.

## 2016-03-11 ENCOUNTER — Ambulatory Visit: Payer: BLUE CROSS/BLUE SHIELD | Admitting: Neurology

## 2016-03-12 ENCOUNTER — Ambulatory Visit: Payer: BLUE CROSS/BLUE SHIELD

## 2016-03-12 NOTE — Therapy (Signed)
[facility] [address] [place], [name], [address] Phone: [phone]   Fax:  [phone]  Speech Language Pathology Treatment  Patient Details  Name: Kelly Wilson MRN: [id number] Date of Birth: [birth year] Referring Provider: Delice Lesch M.D.  Encounter Date: 03/10/2016    Past Medical History:  Diagnosis Date  . GERD 08/09/2008  . 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 Hyperlipidemia   . HYPERLIPIDEMIA 05/17/2007  . Hypertension   . HYPERTENSION 05/17/2007  . Left ear hearing loss    Mild  . OSTEOARTHRITIS 05/17/2007  . SPINAL STENOSIS 05/17/2007  . TRANSIENT ISCHEMIC ATTACK, HX OF 05/17/2007    Past Surgical History:  Procedure Laterality Date  . ABDOMINAL HYSTERECTOMY  1995  . ANKLE SURGERY  2005  . CESAREAN SECTION  1993  . CRANIOTOMY Right 12/17/2015   Procedure: Suboccipital CRANIectomy INTRACRANIAL ANEURYSM FOR VERTEBRAL/BASILAR;  Surgeon: Consuella Lose, MD;  Location: [facility];  Service: Neurosurgery;  Laterality: Right;  suboccipital  . EXTERNAL EAR SURGERY     left side x's 2  . KNEE SURGERY  02/2011  . ORIF ELBOW FRACTURE  02/2011  . SKIN GRAFT FULL THICKNESS ARM    . SKIN GRAFT FULL THICKNESS LEG    . STAPEDECTOMY    . throidectomy    . THYROIDECTOMY, PARTIAL Right 1980  . TUBAL LIGATION      There were no vitals filed for this visit.             ADULT SLP TREATMENT - 03/12/16 0911      General Information   Behavior/Cognition Alert;Cooperative;Pleasant mood     Treatment Provided   Treatment provided Cognitive-Linquistic     Pain Assessment   Pain Assessment No/denies pain     Cognitive-Linquistic Treatment   Treatment focused on Cognition   Skilled Treatment Pt completed min complex executive function/organization task (errands) with errors in planning (90 degrees and bought perishables 90 minutes prior to being done with errands). Pt checked her answers. Pt  indicated anticipatory awareness in that she checked her answers. For her homework, pt also reportedly checked her answers, with accuracy at 94% (16/17).       Assessment / Recommendations / Plan   Plan Continue with current plan of care     Progression Toward Goals   Progression toward goals Progressing toward goals            SLP Short Term Goals - 03/10/16 1655      SLP SHORT TERM GOAL #1   Title pt will demo emergent awareness in mod complex cognitive linguistic tasks 85% of opportunities   Time 1   Period Weeks   Status On-going     SLP SHORT TERM GOAL #2   Title pt will demo attention to detail in mod complex cognitive linguistic tasks to obtain 95% accuracy with self correction allowed   Time 1   Period Weeks   Status On-going     SLP SHORT TERM GOAL #3   Title pt will demo problem solving/reasoning skills in mod complex cognitive linguistic tasks with rare min A   Time 1   Period Weeks   Status On-going     SLP SHORT TERM GOAL #4   Title In written tasks, pt will demo awareness of written errors with min cues, occasionally   Time 1   Period Weeks   Status On-going          SLP Long Term Goals - 03/12/16 [id number]  SLP LONG TERM GOAL #1   Title pt will demo anticipatory awareness (i.e., double-checking more detailed work) in four sessions   Baseline one session 03-10-16   Time 5   Period Weeks   Status On-going     SLP LONG TERM GOAL #2   Title  pt will demo attention to detail by achieving 100% success with mod complex-complex cognitive linguistic tasks with compensation (double checking work) in four sessions   Time 5   [place] #3   Title In mod complex cognitive linguistic tasks, pt will show divided attention 80% of the time, with 90% accuracy on both tasks over two sessions   Time 5   Period Weeks   Status On-going     SLP LONG TERM GOAL #4   Title in written tasks, pt will correct written aphasia errors  independently   Time 5   Status On-going          Plan - 03/12/16 0912    Clinical Impression Statement Pt reported that there have been no problems at work. Pt demonstrated cont'd deficits in awareness, reasoning, and attention/attention to detail. Pt would continue to benefit from SLP services to maximize cognitive-linguistic skills for independent functioning.   Speech Therapy Frequency 2x / week   Duration --  5 weeks   Treatment/Interventions Cognitive reorganization;SLP instruction and feedback;Compensatory strategies;Internal/external aids;Patient/family education;Functional tasks;Cueing hierarchy   Potential to Achieve Goals Good   Potential Considerations Severity of impairments  awareness and attention   Consulted and Agree with Plan of Care Patient      Patient will benefit from skilled therapeutic intervention in order to improve the following deficits and impairments:   Cognitive communication deficit  Aphasic agraphia    Problem List Patient Active Problem List   Diagnosis Date Noted  . Leukopenia   . Bradycardia   . Acute lower UTI   . Agitation   . Lethargic   . Lethargy   . Bilateral headaches   . Other vascular headache   . Hypokalemia   . Heat rash   . Poor appetite   . Hyponatremia   . Cephalalgia   . Obstructive hydrocephalus 12/17/2015  . 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[place]) 12/17/2015  . Headache 12/17/2015  . IVH (intraventricular hemorrhage) ([place]) 12/16/2015  . Chest pain 05/10/2015  . Precordial chest pain 05/10/2015  . Chest wall pain 05/10/2015  . Pain in the chest   . Wellness examination 01/24/2015  . Myalgia 11/14/2014  . Muscle cramps 11/14/2014  . Pinched nerve 08/28/2014  . Routine general medical examination at a health care facility 08/12/2013  . Cough 04/21/2012  . Lymphadenitis, acute 10/23/2011  . GERD 08/09/2008  . OTHER DYSPHAGIA 06/18/2008  . 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 NECK DISORDER 05/22/2008   . HEADACHE 08/16/2007  . OTITIS MEDIA, ACUTE, LEFT 08/11/2007  . OTHER ABNORMAL BLOOD CHEMISTRY 07/25/2007  . HYPERLIPIDEMIA 05/17/2007  . Essential hypertension 05/17/2007  . OSTEOARTHRITIS 05/17/2007  . SPINAL STENOSIS 05/17/2007  . History of cardiovascular disorder 05/17/2007    [facility] ,MS, CCC-SLP  03/12/2016, 9:15 AM  [facility] [address] [place], [name], [address] Phone: [phone]   Fax:  [phone]   Name: Kelly Wilson MRN: [id number] Date of Birth: 1956/01/30

## 2016-03-17 ENCOUNTER — Ambulatory Visit: Payer: BLUE CROSS/BLUE SHIELD | Admitting: Physical Therapy

## 2016-03-17 ENCOUNTER — Ambulatory Visit: Payer: BLUE CROSS/BLUE SHIELD

## 2016-03-17 DIAGNOSIS — R2689 Other abnormalities of gait and mobility: Secondary | ICD-10-CM | POA: Diagnosis not present

## 2016-03-17 DIAGNOSIS — R41841 Cognitive communication deficit: Secondary | ICD-10-CM

## 2016-03-17 DIAGNOSIS — R488 Other symbolic dysfunctions: Secondary | ICD-10-CM

## 2016-03-17 NOTE — Therapy (Signed)
[facility] [address] [place], [name], [address] Phone: [phone]   Fax:  [phone]  Physical Therapy Treatment and Discharge Summary  Patient Details  Name: Kelly Wilson MRN: [id number] Date of Birth: 1955-10-16 Referring Provider: Jamse Arn, MD  Encounter Date: 03/17/2016      PT End of Session - 03/17/16 1011    Visit Number 11   Number of Visits 13   Date for PT Re-Evaluation 03/25/16   Authorization Type [employer] - 30 visit limit (PT, OT combined)   Authorization - Visit Number 10   Authorization - Number of Visits 30   PT Start Time [phone]   PT Stop Time 1005  Session ended early due to all goals met, pt DC'ed   PT Time Calculation (min) 34 min   Activity Tolerance Patient tolerated treatment well   Behavior During Therapy [facility] for tasks assessed/performed      Past Medical History:  Diagnosis Date  . GERD 08/09/2008  . 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 Hyperlipidemia   . HYPERLIPIDEMIA 05/17/2007  . Hypertension   . HYPERTENSION 05/17/2007  . Left ear hearing loss    Mild  . OSTEOARTHRITIS 05/17/2007  . SPINAL STENOSIS 05/17/2007  . TRANSIENT ISCHEMIC ATTACK, HX OF 05/17/2007    Past Surgical History:  Procedure Laterality Date  . ABDOMINAL HYSTERECTOMY  1995  . ANKLE SURGERY  2005  . CESAREAN SECTION  1993  . CRANIOTOMY Right 12/17/2015   Procedure: Suboccipital CRANIectomy INTRACRANIAL ANEURYSM FOR VERTEBRAL/BASILAR;  Surgeon: Consuella Lose, MD;  Location: [facility];  Service: Neurosurgery;  Laterality: Right;  suboccipital  . EXTERNAL EAR SURGERY     left side x's 2  . KNEE SURGERY  02/2011  . ORIF ELBOW FRACTURE  02/2011  . SKIN GRAFT FULL THICKNESS ARM    . SKIN GRAFT FULL THICKNESS LEG    . STAPEDECTOMY    . throidectomy    . THYROIDECTOMY, PARTIAL Right 1980  . TUBAL LIGATION      There were no vitals filed for this visit.      Subjective Assessment -  03/17/16 0935    Subjective Pt reports no significant changes, no falls. Work is going well. Pt states, "I'm not 100% myself, but I'm very close."   Pertinent History Likes to be called "[name]".    PMH significant for: HTN, TIA   Patient Stated Goals "I want to get rid of that rolling walker; and to be able to walk around my house without feeling like I have to hold onto something."   Currently in Pain? No/denies            [facility] PT Assessment - 03/17/16 0001      Ambulation/Gait   Ambulation/Gait Yes   Ambulation/Gait Assistance 7: Independent   Ambulation Distance (Feet) 1100 Feet   Assistive device None   Gait Pattern Within Functional Limits   Ambulation Surface Level;Unlevel;Indoor;Outdoor;Paved   Gait velocity 3.37 ft/sec   Stairs Yes   Stairs Assistance 7: Independent   Stair Management Technique No rails;Alternating pattern   Number of Stairs 4   Height of Stairs 6     Functional Gait  Assessment   Gait assessed  Yes   Gait Level Surface Walks 20 ft in less than 5.5 sec, no assistive devices, good speed, no evidence for imbalance, normal gait pattern, deviates no more than 6 in outside of the 12 in walkway width.   Change in Gait Speed  Able to smoothly change walking speed without loss of balance or gait deviation. Deviate no more than 6 in outside of the 12 in walkway width.   Gait with Horizontal Head Turns Performs head turns smoothly with no change in gait. Deviates no more than 6 in outside 12 in walkway width   Gait with Vertical Head Turns Performs head turns with no change in gait. Deviates no more than 6 in outside 12 in walkway width.   Gait and Pivot Turn Pivot turns safely within 3 sec and stops quickly with no loss of balance.   Step Over Obstacle Is able to step over 2 stacked shoe boxes taped together (9 in total height) without changing gait speed. No evidence of imbalance.   Gait with Narrow Base of Support Ambulates 7-9 steps.   Gait with Eyes Closed  Walks 20 ft, uses assistive device, slower speed, mild gait deviations, deviates 6-10 in outside 12 in walkway width. Ambulates 20 ft in less than 9 sec but greater than 7 sec.   Ambulating Backwards Walks 20 ft, no assistive devices, good speed, no evidence for imbalance, normal gait   Steps Alternating feet, no rail.   Total Score 28            Vestibular Assessment - 03/17/16 0001      Balancemaster   Therapist, occupational Comment Composite score = 72%, which is above age/height normative value of 69%. Sensory Analysis: Vestibular = 64%. Findings suggest pt ability to utilize visual, vestibular, and somatosensory input for balance are all WNL for age/height. 0 "falls" sustained. Strategy Analysis: WNL. COG alignment: slightly to R of midline.                         PT Education - 03/17/16 1008    Education provided Yes   Education Details PT goals, findings, progress, and DC plan.   Person(s) Educated Patient   Methods Explanation;Handout   Comprehension Verbalized understanding          PT Short Term Goals - 02/17/16 1416      PT SHORT TERM GOAL #1   Title Pt will perform initial HEP with mod I using handout to maximize functional gains made in PT.  (Target date: 02/19/16)   Baseline Met 7/24.   Status Achieved     PT SHORT TERM GOAL #2   Title Pt will improve gait velocity (without AD) from 1.89 ft/sec to >/= 2.19 ft/sec to indicate increased efficiency of mobility. (02/19/16)   Baseline Met 7/31 with gait velocity of 3.19 ft/sec.   Status Achieved     PT SHORT TERM GOAL #3   Title Pt will improve DGI from 7/24 to >/= 11/24 to indicate improved dynamic gait stability.  (02/19/16)   Baseline Met 7/31 with score of 17/24.   Status Achieved     PT SHORT TERM GOAL #4   Title Complete Berg and improve score by 4 points from baseline to progress toward significant improvement in standing balance. (02/19/16)   Baseline 46/56  on 01/27/16;  54/56 on 7/31.   Status Achieved     PT SHORT TERM GOAL #5   Title Pt will ambulate 52' over level, indoor surfaces without AD with no overt LOB to indicate progress toward independence with household mobility. (02/19/16)   Baseline Met 7/24.   Status Achieved     PT SHORT TERM GOAL #6   Title Pt will  ambulate 200' over unlevel, paved surfaces with mod I using LRAD to progress toward safety with commnuity mobility. (02/19/16)   Baseline Met 7/31.   Status Achieved     PT SHORT TERM GOAL #7   Title Pt will negotiate 2 stairs without rails with mod I using LRAD to indicate increased safety using primary home entrance.  (02/19/16)   Baseline Met 7/31.   Status Achieved           PT Long Term Goals - 03/17/16 0953      PT LONG TERM GOAL #1   Title Pt will improve Berg score by 8 points from baseline to indicate significant improvement in dynamic standing balance.  (03/18/16)   Baseline Met 7/31.   Status Achieved     PT LONG TERM GOAL #2   Title Pt will improve gait velocity (without AD) from 1.89 ft/sec to >/= 2.49 ft/sec to indicate significantly increased efficiency of ambulation. (03/18/16)   Baseline Met 7/31 with gait velocity of 3.19 ft/sec.  8/29: gait velocity = 3.47 ft/sec   Status Achieved     PT LONG TERM GOAL #3   Title Pt will improve DGI from 7/24 to > 20/24 to indicate decreased fall risk.  (03/18/16)   Baseline Met 8/7, with score of 22/24.   Status Achieved     PT LONG TERM GOAL #4   Title Pt will independently ambulate 200' over level, indoor surfaces with effective obstacle negotiation to indicate independence with household ambulaton. (03/18/16)   Baseline Met 7/31.   Status Achieved     PT LONG TERM GOAL #5   Title Pt will independently ambulate 1,000' over unlevel, paved and grass surfaces with no overt LOB to indicate increased safety with community mobility.  (03/18/16)   Baseline Met 8/29.   Status Achieved     Additional Long Term Goals    Additional Long Term Goals Yes     PT LONG TERM GOAL #6   Title Pt will improve FGA score from 24 to 28/30 to indicate significant improvement in dynamic gait.  (Target date: 03/18/16)   Baseline 8/29: FGA score = 28/30   Status Achieved     PT LONG TERM GOAL #7   Title Pt will improve SOT composite score from 56% to 64% to indicate significant improvement in use of multi-sensory input for balance.  (03/18/16)   Baseline 8/29: SOT composite score = 72%   Status Achieved     PT LONG TERM GOAL #8   Title Pt will improve SOT Vestibular score from 0% to >/= 25% to indicate significant improvement in use of vestibular input for balance.  (03/18/16)   Baseline 8/29: SOT Vestibular score = 64%   Status Achieved               Plan - 03/17/16 1007    Clinical Impression Statement Pt has met or surpassed 7/7 STG's and 8/8 LTG's, demonstrating signifcant improvement in stability/independence with functional mobility. Pt will therefore be discharged from outpatient PT at this time.    Consulted and Agree with Plan of Care Patient      Patient will benefit from skilled therapeutic intervention in order to improve the following deficits and impairments:     Visit Diagnosis: Other abnormalities of gait and mobility     Problem List Patient Active Problem List   Diagnosis Date Noted  . Leukopenia   . Bradycardia   . Acute lower UTI   . Agitation   .  Lethargic   . Lethargy   . Bilateral headaches   . Other vascular headache   . Hypokalemia   . Heat rash   . Poor appetite   . Hyponatremia   . Cephalalgia   . Obstructive hydrocephalus 12/17/2015  . 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 Headache 12/17/2015  . IVH (intraventricular hemorrhage) ([place]) 12/16/2015  . Chest pain 05/10/2015  . Precordial chest pain 05/10/2015  . Chest wall pain 05/10/2015  . Pain in the chest   . Wellness examination 01/24/2015  . Myalgia 11/14/2014  . Muscle cramps  11/14/2014  . Pinched nerve 08/28/2014  . Routine general medical examination at a health care facility 08/12/2013  . Cough 04/21/2012  . Lymphadenitis, acute 10/23/2011  . GERD 08/09/2008  . OTHER DYSPHAGIA 06/18/2008  . 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 NECK DISORDER 05/22/2008  . HEADACHE 08/16/2007  . OTITIS MEDIA, ACUTE, LEFT 08/11/2007  . OTHER ABNORMAL BLOOD CHEMISTRY 07/25/2007  . HYPERLIPIDEMIA 05/17/2007  . Essential hypertension 05/17/2007  . OSTEOARTHRITIS 05/17/2007  . SPINAL STENOSIS 05/17/2007  . History of cardiovascular disorder 05/17/2007    PHYSICAL THERAPY DISCHARGE SUMMARY  Visits from Start of Care: 11  Current functional level related to goals / functional outcomes: See above goals for details.   Remaining deficits: N/A   Education / Equipment: See above.  Plan: Patient agrees to discharge.  Patient goals were met. Patient is being discharged due to meeting the stated rehab goals.  ?????         Name: MARQUITTA PERSICHETTI MRN: [id number] Date of Birth: 10/20/1955   Billie Ruddy, PT, [facility] [address] [place], [name], [address] Phone: [phone]   Fax:  [phone] 03/17/16, 10:13 AM

## 2016-03-18 NOTE — Therapy (Signed)
[facility] [address] [place], [name], [address] Phone: [phone]   Fax:  [phone]  Speech Language Pathology Treatment  Patient Details  Name: FANTASIA JINKINS MRN: [id number] Date of Birth: [birth year] Referring Provider: Delice Lesch M.D.  Encounter Date: 03/17/2016      End of Session - 03/17/16 1725    Visit Number 7   Number of Visits 17   Date for SLP Re-Evaluation 04/17/16   Authorization - Visit Number 7   Authorization - Number of Visits 30   SLP Start Time 0850   SLP Stop Time  0930   SLP Time Calculation (min) 40 min   Activity Tolerance Patient tolerated treatment well      Past Medical History:  Diagnosis Date  . GERD 08/09/2008  . 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 Hyperlipidemia   . HYPERLIPIDEMIA 05/17/2007  . Hypertension   . HYPERTENSION 05/17/2007  . Left ear hearing loss    Mild  . OSTEOARTHRITIS 05/17/2007  . SPINAL STENOSIS 05/17/2007  . TRANSIENT ISCHEMIC ATTACK, HX OF 05/17/2007    Past Surgical History:  Procedure Laterality Date  . ABDOMINAL HYSTERECTOMY  1995  . ANKLE SURGERY  2005  . CESAREAN SECTION  1993  . CRANIOTOMY Right 12/17/2015   Procedure: Suboccipital CRANIectomy INTRACRANIAL ANEURYSM FOR VERTEBRAL/BASILAR;  Surgeon: Consuella Lose, MD;  Location: [facility];  Service: Neurosurgery;  Laterality: Right;  suboccipital  . EXTERNAL EAR SURGERY     left side x's 2  . KNEE SURGERY  02/2011  . ORIF ELBOW FRACTURE  02/2011  . SKIN GRAFT FULL THICKNESS ARM    . SKIN GRAFT FULL THICKNESS LEG    . STAPEDECTOMY    . throidectomy    . THYROIDECTOMY, PARTIAL Right 1980  . TUBAL LIGATION      There were no vitals filed for this visit.             ADULT SLP TREATMENT - 03/18/16 0001      General Information   Behavior/Cognition Alert;Cooperative;Pleasant mood     Treatment Provided   Treatment provided Cognitive-Linquistic     Pain Assessment    Pain Assessment No/denies pain     Cognitive-Linquistic Treatment   Treatment focused on Cognition   Skilled Treatment SLP engaged pt in higher level cognitive skills planning a surprise party for her friend. SLP req'd to cue pt for completeness of situation rarely. Organization of task was functional but less organized than SLP would have thought for pt premorbidly. Pt forgot drinks (but had ice), silverware, and whether more food is needed (hamburgers, hot dogs, fixings were written).  Pt did not have plan for $$ (chip in, she's responsible).     Assessment / Recommendations / Plan   Plan Continue with current plan of care     Progression Toward Goals   Progression toward goals Progressing toward goals            SLP Short Term Goals - 03/17/16 1726      SLP SHORT TERM GOAL #1   Title pt will demo emergent awareness in mod complex cognitive linguistic tasks 85% of opportunities   Time 1   Period Weeks   Status Not Met     SLP SHORT TERM GOAL #2   Title pt will demo attention to detail in mod complex cognitive linguistic tasks to obtain 95% accuracy with self correction allowed   Time 1   Period Weeks   Status Not  Met     SLP SHORT TERM GOAL #3   Title pt will demo problem solving/reasoning skills in mod complex cognitive linguistic tasks with rare min A   Time 1   Period Weeks   Status Partially Met     SLP SHORT TERM GOAL #4   Title In written tasks, pt will demo awareness of written errors with min cues, occasionally   Time 1   Period Weeks   Status Deferred  to work with cognitive goals          SLP Long Term Goals - 03/17/16 [place] #1   Title pt will demo anticipatory awareness (i.e., double-checking more detailed work) in four sessions   Baseline one session 03-10-16   Time 4   Period Weeks   Status On-going     SLP LONG TERM GOAL #2   Title  pt will demo attention to detail by achieving 100% success with mod complex-complex  cognitive linguistic tasks with compensation (double checking work) in four sessions   Time 4   [place] #3   Title In mod complex cognitive linguistic tasks, pt will show divided attention 80% of the time, with 90% accuracy on both tasks over two sessions   Time 4   Period Weeks   Status On-going     SLP LONG TERM GOAL #4   Title in written tasks, pt will correct written aphasia errors independently   Time 4   Status On-going          Plan - 03/17/16 1725    Clinical Impression Statement  Pt demonstrated cont'd deficits in awareness, reasoning, and attention/attention to detail in a simple executive function (novel) today. Pt's aphasia in writing appears to be resolving slowly Pt would continue to benefit from SLP services to maximize cognitive-linguistic skills for independent functioning.   Speech Therapy Frequency 2x / week   Duration 4 weeks  5 weeks   Treatment/Interventions Cognitive reorganization;SLP instruction and feedback;Compensatory strategies;Internal/external aids;Patient/family education;Functional tasks;Cueing hierarchy   Potential to Achieve Goals Good   Potential Considerations Severity of impairments  awareness and attention   Consulted and Agree with Plan of Care Patient      Patient will benefit from skilled therapeutic intervention in order to improve the following deficits and impairments:   Cognitive communication deficit  Aphasic agraphia    Problem List Patient Active Problem List   Diagnosis Date Noted  . Leukopenia   . Bradycardia   . Acute lower UTI   . Agitation   . Lethargic   . Lethargy   . Bilateral headaches   . Other vascular headache   . Hypokalemia   . Heat rash   . Poor appetite   . Hyponatremia   . Cephalalgia   . Obstructive hydrocephalus 12/17/2015  . 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[place]) 12/17/2015  . Headache 12/17/2015  . IVH (intraventricular hemorrhage) ([place]) 12/16/2015  .  Chest pain 05/10/2015  . Precordial chest pain 05/10/2015  . Chest wall pain 05/10/2015  . Pain in the chest   . Wellness examination 01/24/2015  . Myalgia 11/14/2014  . Muscle cramps 11/14/2014  . Pinched nerve 08/28/2014  . Routine general medical examination at a health care facility 08/12/2013  . Cough 04/21/2012  . Lymphadenitis, acute 10/23/2011  . GERD 08/09/2008  . OTHER DYSPHAGIA 06/18/2008  . 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 NECK DISORDER 05/22/2008  .  HEADACHE 08/16/2007  . OTITIS MEDIA, ACUTE, LEFT 08/11/2007  . OTHER ABNORMAL BLOOD CHEMISTRY 07/25/2007  . HYPERLIPIDEMIA 05/17/2007  . Essential hypertension 05/17/2007  . OSTEOARTHRITIS 05/17/2007  . SPINAL STENOSIS 05/17/2007  . History of cardiovascular disorder 05/17/2007    [facility] ,[place], [place]  03/18/2016, 5:28 PM  [facility] [address] [place], [name], [address] Phone: [phone]   Fax:  [phone]   Name: DIEGO DELANCEY MRN: [id number] Date of Birth: 1956/02/11

## 2016-03-19 ENCOUNTER — Ambulatory Visit: Payer: BLUE CROSS/BLUE SHIELD

## 2016-03-19 DIAGNOSIS — R2689 Other abnormalities of gait and mobility: Secondary | ICD-10-CM | POA: Diagnosis not present

## 2016-03-19 DIAGNOSIS — R41841 Cognitive communication deficit: Secondary | ICD-10-CM

## 2016-03-19 NOTE — Therapy (Signed)
[facility] [address] [place], [name], [address] Phone: [phone]   Fax:  [phone]  Speech Language Pathology Treatment  Patient Details  Name: Kelly Wilson MRN: [id number] Date of Birth: 12-Nov-1955 Referring Provider: Delice Lesch M.D.  Encounter Date: 03/19/2016      End of Session - 03/19/16 0930    Visit Number 8   Number of Visits 17   Date for SLP Re-Evaluation 04/17/16   Authorization - Visit Number 8   Authorization - Number of Visits 47   SLP Start Time 0847   SLP Stop Time  0925   SLP Time Calculation (min) 38 min   Activity Tolerance Patient tolerated treatment well      Past Medical History:  Diagnosis Date  . GERD 08/09/2008  . 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 Hyperlipidemia   . HYPERLIPIDEMIA 05/17/2007  . Hypertension   . HYPERTENSION 05/17/2007  . Left ear hearing loss    Mild  . OSTEOARTHRITIS 05/17/2007  . SPINAL STENOSIS 05/17/2007  . TRANSIENT ISCHEMIC ATTACK, HX OF 05/17/2007    Past Surgical History:  Procedure Laterality Date  . ABDOMINAL HYSTERECTOMY  1995  . ANKLE SURGERY  2005  . CESAREAN SECTION  1993  . CRANIOTOMY Right 12/17/2015   Procedure: Suboccipital CRANIectomy INTRACRANIAL ANEURYSM FOR VERTEBRAL/BASILAR;  Surgeon: Consuella Lose, MD;  Location: [name];  Service: Neurosurgery;  Laterality: Right;  suboccipital  . EXTERNAL EAR SURGERY     left side x's 2  . KNEE SURGERY  02/2011  . ORIF ELBOW FRACTURE  02/2011  . SKIN GRAFT FULL THICKNESS ARM    . SKIN GRAFT FULL THICKNESS LEG    . STAPEDECTOMY    . throidectomy    . THYROIDECTOMY, PARTIAL Right 1980  . TUBAL LIGATION      There were no vitals filed for this visit.      Subjective Assessment - 03/19/16 0855    Subjective Pt brought her homework to SLP today.               ADULT SLP TREATMENT - 03/19/16 0856      General Information   Behavior/Cognition  Alert;Cooperative;Pleasant mood     Treatment Provided   Treatment provided Cognitive-Linquistic     Pain Assessment   Pain Assessment 0-10   Pain Score 3    Pain Location head   Pain Descriptors / Indicators Headache   Pain Intervention(s) Monitored during session;Premedicated before session     Cognitive-Linquistic Treatment   Treatment focused on Cognition   Skilled Treatment Pt added some essential things to her surprise party planning but forgot drinks. She presented a plausible option for getting her friend to the venue. Pt to cook something over the weekend that needs a recipe to assess her organization/attention in a novel task - pt thinks she is at baseline, cognitively. SLP discussed the difference between novel and "automatic" tasks and how automatic tasks are done more easily after insult to brain than novel tasks. Pt cont'd to tell SLP she was at baseline. SLP suggested she ask someone who has been with her much of the time since her hospitalization, who she knows will be honest with her, about her thinking skills.     Assessment / Recommendations / Plan   Plan Continue with current plan of care     Progression Toward Goals   Progression toward goals Progressing toward goals          SLP Education -  03/19/16 3295    Education provided Yes   Education Details deficts in cognition/novel tasks   Person(s) Educated Patient   Methods Explanation   Comprehension Verbalized understanding          SLP Short Term Goals - 03/17/16 1726      SLP SHORT TERM GOAL #1   Title pt will demo emergent awareness in mod complex cognitive linguistic tasks 85% of opportunities   Time 1   Period Weeks   Status Not Met     SLP SHORT TERM GOAL #2   Title pt will demo attention to detail in mod complex cognitive linguistic tasks to obtain 95% accuracy with self correction allowed   Time 1   Period Weeks   Status Not Met     SLP SHORT TERM GOAL #3   Title pt will demo problem  solving/reasoning skills in mod complex cognitive linguistic tasks with rare min A   Time 1   Period Weeks   Status Partially Met     SLP SHORT TERM GOAL #4   Title In written tasks, pt will demo awareness of written errors with min cues, occasionally   Time 1   Period Weeks   Status Deferred  to work with cognitive goals          SLP Long Term Goals - 03/19/16 0933      SLP LONG TERM GOAL #1   Title pt will demo anticipatory awareness (i.e., double-checking more detailed work) in four sessions   Baseline one session 03-10-16   Time 4   Period Weeks   Status On-going     SLP LONG TERM GOAL #2   Title  pt will demo attention to detail by achieving 100% success with mod complex-complex cognitive linguistic tasks with compensation (double checking work) in four sessions   Time 4   Period Weeks   Status On-going     SLP LONG TERM GOAL #3   Title In mod complex cognitive linguistic tasks, pt will show divided attention 80% of the time, with 90% accuracy on both tasks over two sessions   Time 4   Period Weeks   Status On-going     SLP LONG TERM GOAL #4   Title in written tasks, pt will correct written aphasia errors independently   Status Deferred  aphasia in writing has resovd          Plan - 03/19/16 0930    Clinical Impression Statement  SLP thinks pt demonstrated cont'd deficits in awareness,and attention to detail with novel simple executive function task today. Aphasic writing has resoved. SLP believes pt is not at baseline yet, and that she would continue to benefit from SLP services to maximize cognitive-linguistic skills for independent functioning. Pt believes she is at baseline, and SLP encouraged her to engage in tasks and inquire of trusted friends to learn more about if she truly at her baseline.    Speech Therapy Frequency 2x / week   Duration 4 weeks  5 weeks   Treatment/Interventions Cognitive reorganization;SLP instruction and feedback;Compensatory  strategies;Internal/external aids;Patient/family education;Functional tasks;Cueing hierarchy   Potential to Achieve Goals Good   Potential Considerations Severity of impairments  awareness and attention   Consulted and Agree with Plan of Care Patient      Patient will benefit from skilled therapeutic intervention in order to improve the following deficits and impairments:   Cognitive communication deficit    Problem List Patient Active Problem List   Diagnosis Date  Noted  . Leukopenia   . Bradycardia   . Acute lower UTI   . Agitation   . Lethargic   . Lethargy   . Bilateral headaches   . Other vascular headache   . Hypokalemia   . Heat rash   . Poor appetite   . Hyponatremia   . Cephalalgia   . Obstructive hydrocephalus 12/17/2015  . 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[place] [facility]) 12/17/2015  . Headache 12/17/2015  . IVH (intraventricular hemorrhage) ([place]) 12/16/2015  . Chest pain 05/10/2015  . Precordial chest pain 05/10/2015  . Chest wall pain 05/10/2015  . Pain in the chest   . Wellness examination 01/24/2015  . Myalgia 11/14/2014  . Muscle cramps 11/14/2014  . Pinched nerve 08/28/2014  . Routine general medical examination at a health care facility 08/12/2013  . Cough 04/21/2012  . Lymphadenitis, acute 10/23/2011  . GERD 08/09/2008  . OTHER DYSPHAGIA 06/18/2008  . 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 NECK DISORDER 05/22/2008  . HEADACHE 08/16/2007  . OTITIS MEDIA, ACUTE, LEFT 08/11/2007  . OTHER ABNORMAL BLOOD CHEMISTRY 07/25/2007  . HYPERLIPIDEMIA 05/17/2007  . Essential hypertension 05/17/2007  . OSTEOARTHRITIS 05/17/2007  . SPINAL STENOSIS 05/17/2007  . History of cardiovascular disorder 05/17/2007    [facility] ,MS, [place]  03/19/2016, 9:34 AM  [facility] [address] [place], [name], [address] Phone: [phone]   Fax:  [phone]   Name: Kelly Wilson MRN:  [id number] Date of Birth: [birth year]

## 2016-03-19 NOTE — Patient Instructions (Signed)
Make a recipe this weekend that you have never made before and let me know how it goes. We can problem solve if there are some snags or snafus.

## 2016-04-20 ENCOUNTER — Encounter: Payer: Self-pay | Admitting: Endocrinology

## 2016-04-20 ENCOUNTER — Ambulatory Visit (INDEPENDENT_AMBULATORY_CARE_PROVIDER_SITE_OTHER): Payer: BLUE CROSS/BLUE SHIELD | Admitting: Endocrinology

## 2016-04-20 DIAGNOSIS — I10 Essential (primary) hypertension: Secondary | ICD-10-CM

## 2016-04-20 DIAGNOSIS — R112 Nausea with vomiting, unspecified: Secondary | ICD-10-CM | POA: Diagnosis not present

## 2016-04-20 DIAGNOSIS — D751 Secondary polycythemia: Secondary | ICD-10-CM

## 2016-04-20 LAB — BASIC METABOLIC PANEL
BUN: 18 mg/dL (ref 6–23)
CALCIUM: 9.7 mg/dL (ref 8.4–10.5)
CHLORIDE: 100 meq/L (ref 96–112)
CO2: 24 mEq/L (ref 19–32)
CREATININE: 0.88 mg/dL (ref 0.40–1.20)
GFR: 69.06 mL/min (ref >60.00)
Glucose, Bld: 102 mg/dL — ABNORMAL HIGH (ref 70–99)
Potassium: 3.6 mEq/L (ref 3.5–5.1)
SODIUM: 135 meq/L (ref 135–145)

## 2016-04-20 LAB — CBC WITH DIFFERENTIAL/PLATELET
BASOS ABS: 0 10*3/uL (ref 0.0–0.1)
Basophils Relative: 0.4 % (ref 0.0–3.0)
EOS PCT: 0.4 % (ref 0.0–5.0)
Eosinophils Absolute: 0 10*3/uL (ref 0.0–0.7)
HCT: 47.1 % — ABNORMAL HIGH (ref 36.0–46.0)
Hemoglobin: 16.3 g/dL — ABNORMAL HIGH (ref 12.0–15.0)
LYMPHS ABS: 2.1 10*3/uL (ref 0.7–4.0)
Lymphocytes Relative: 33.7 % (ref 12.0–46.0)
MCHC: 34.5 g/dL (ref 30.0–36.0)
MCV: 84.7 fl (ref 78.0–100.0)
MONO ABS: 0.4 10*3/uL (ref 0.1–1.0)
Monocytes Relative: 6.1 % (ref 3.0–12.0)
NEUTROS ABS: 3.6 10*3/uL (ref 1.4–7.7)
NEUTROS PCT: 59.4 % (ref 43.0–77.0)
PLATELETS: 356 10*3/uL (ref 150.0–400.0)
RBC: 5.56 Mil/uL — ABNORMAL HIGH (ref 3.87–5.11)
RDW: 13.9 % (ref 11.5–15.5)
WBC: 6.1 10*3/uL (ref 4.0–10.5)

## 2016-04-20 LAB — AMYLASE: AMYLASE: 33 U/L (ref 27–131)

## 2016-04-20 LAB — T4, FREE: Free T4: 0.77 ng/dL (ref 0.60–1.60)

## 2016-04-20 LAB — HEPATIC FUNCTION PANEL
ALT: 10 U/L (ref 0–35)
AST: 14 U/L (ref 0–37)
Albumin: 4.2 g/dL (ref 3.5–5.2)
Alkaline Phosphatase: 65 U/L (ref 39–117)
BILIRUBIN DIRECT: 0.1 mg/dL (ref 0.0–0.3)
BILIRUBIN TOTAL: 0.7 mg/dL (ref 0.2–1.2)
Total Protein: 7.2 g/dL (ref 6.0–8.3)

## 2016-04-20 LAB — TSH: TSH: 2.73 u[IU]/mL (ref 0.35–4.50)

## 2016-04-20 MED ORDER — ONDANSETRON 4 MG PO TBDP: 4.0000 mg | ORAL_TABLET | Freq: Three times a day (TID) | ORAL | 2 refills | Status: DC | PRN

## 2016-04-20 NOTE — Progress Notes (Signed)
Subjective:    Patient ID: Kelly Wilson, female    DOB: May 18, 1956, 60 y.o.   MRN: [id number]  HPI Pt states no change in headache throughout the head.  She has 1 month of moderate n/v, but no assoc abd pain.   She has missed maxzide due to vomiting.   Past Medical History:  Diagnosis Date  . GERD 08/09/2008  . 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 Hyperlipidemia   . HYPERLIPIDEMIA 05/17/2007  . Hypertension   . HYPERTENSION 05/17/2007  . Left ear hearing loss    Mild  . OSTEOARTHRITIS 05/17/2007  . SPINAL STENOSIS 05/17/2007  . TRANSIENT ISCHEMIC ATTACK, HX OF 05/17/2007    Past Surgical History:  Procedure Laterality Date  . ABDOMINAL HYSTERECTOMY  1995  . ANKLE SURGERY  2005  . CESAREAN SECTION  1993  . CRANIOTOMY Right 12/17/2015   Procedure: Suboccipital CRANIectomy INTRACRANIAL ANEURYSM FOR VERTEBRAL/BASILAR;  Surgeon: Consuella Lose, MD;  Location: [facility];  Service: Neurosurgery;  Laterality: Right;  suboccipital  . EXTERNAL EAR SURGERY     left side x's 2  . KNEE SURGERY  02/2011  . ORIF ELBOW FRACTURE  02/2011  . SKIN GRAFT FULL THICKNESS ARM    . SKIN GRAFT FULL THICKNESS LEG    . STAPEDECTOMY    . throidectomy    . THYROIDECTOMY, PARTIAL Right 1980  . TUBAL LIGATION      Social History   Social History  . Marital status: Married    Spouse name: N/A  . Number of children: N/A  . Years of education: N/A   Occupational History  . realtor    Social History Main Topics  . Smoking status: Never Smoker  . Smokeless tobacco: Never Used     Comment: Married, she is a Cabin crew  . Alcohol use No  . Drug use: No  . Sexual activity: Not on file   Other Topics Concern  . Not on file   Social History Narrative   Married, Cabin crew, 2 daughters   Caffeine use: tea daily    Current Outpatient Prescriptions on File Prior to Visit  Medication Sig Dispense Refill  . levETIRAcetam (KEPPRA) 500 MG tablet Take 1 tablet (500 mg total) by mouth 2 (two)  times daily. 60 tablet 1  . omeprazole (PRILOSEC) 40 MG capsule Take 1 capsule (40 mg total) by mouth daily. 30 capsule 1  . triamterene-hydrochlorothiazide (MAXZIDE-25) 37.5-25 MG tablet Take 0.5 tablets by mouth daily. 45 tablet 3   No current facility-administered medications on file prior to visit.     Allergies  Allergen Reactions  . Morphine Hives  . Rosuvastatin Other (See Comments)    myalgia    Family History  Problem Relation Age of Onset  . Coronary artery disease Mother   . Diabetes Mother   . Coronary artery disease Father   . Diabetes Sister     2 sister with DM  . Cancer Other     uncle had rectal cancer  . Hypertension Other   . Hyperlipidemia Other     BP (!) 152/98 Comment: Without BP Medication  Pulse 81   Temp 98.5 F (36.9 C) (Oral)   Ht 5\' 6"  (1.676 m)   Wt 222 lb (100.7 kg)   SpO2 97%   BMI 35.83 kg/m   Review of Systems She has lost a few lbs.  Denies fever and diarrhea.      Objective:   Physical Exam VITAL SIGNS:  See vs page  GENERAL: no distress LUNGS:  Clear to auscultation ABDOMEN: abdomen is soft, nontender.  no hepatosplenomegaly.  not distended.  no hernia.    Lab Results  Component Value Date   WBC 6.1 04/20/2016   HGB 16.3 (H) 04/20/2016   HCT 47.1 (H) 04/20/2016   PLT 356.0 04/20/2016   GLUCOSE 102 (H) 04/20/2016   CHOL 263 (H) 05/10/2015   TRIG 83 05/10/2015   HDL 76 05/10/2015   LDLDIRECT 171.2 08/03/2013   LDLCALC 170 (H) 05/10/2015   ALT 10 04/20/2016   AST 14 04/20/2016   NA 135 04/20/2016   K 3.6 04/20/2016   CL 100 04/20/2016   CREATININE 0.88 04/20/2016   BUN 18 04/20/2016   CO2 24 04/20/2016   TSH 2.73 04/20/2016   HGBA1C 5.7 (H) 12/17/2015   MICROALBUR 0.4 08/03/2013      Assessment & Plan:  Nausea, new, uncertain etiology.  Check CT.   Polycythemia: we'll follow.   HTN: due to being off the maxzide.  Pt will try to resume.

## 2016-04-20 NOTE — Patient Instructions (Signed)
blood tests are requested for you today.  We'll let you know about the results. I have sent a prescription to your pharmacy, for the nausea.  If these tests are normal, the next step is to recheck the CT.   If that does help, then next step is to see a specialist.

## 2016-04-29 ENCOUNTER — Encounter
Payer: BLUE CROSS/BLUE SHIELD | Attending: Physical Medicine & Rehabilitation | Admitting: Physical Medicine & Rehabilitation

## 2016-04-29 DIAGNOSIS — E785 Hyperlipidemia, unspecified: Secondary | ICD-10-CM | POA: Insufficient documentation

## 2016-04-29 DIAGNOSIS — Z8673 Personal history of transient ischemic attack (TIA), and cerebral infarction without residual deficits: Secondary | ICD-10-CM | POA: Insufficient documentation

## 2016-04-29 DIAGNOSIS — K219 Gastro-esophageal reflux disease without esophagitis: Secondary | ICD-10-CM | POA: Insufficient documentation

## 2016-04-29 DIAGNOSIS — Z9889 Other specified postprocedural states: Secondary | ICD-10-CM | POA: Insufficient documentation

## 2016-04-29 DIAGNOSIS — D72819 Decreased white blood cell count, unspecified: Secondary | ICD-10-CM | POA: Insufficient documentation

## 2016-04-29 DIAGNOSIS — I10 Essential (primary) hypertension: Secondary | ICD-10-CM | POA: Insufficient documentation

## 2016-04-29 DIAGNOSIS — M1990 Unspecified osteoarthritis, unspecified site: Secondary | ICD-10-CM | POA: Insufficient documentation

## 2016-04-29 DIAGNOSIS — G919 Hydrocephalus, unspecified: Secondary | ICD-10-CM | POA: Insufficient documentation

## 2016-04-29 DIAGNOSIS — I729 Aneurysm of unspecified site: Secondary | ICD-10-CM | POA: Insufficient documentation

## 2016-04-29 DIAGNOSIS — R531 Weakness: Secondary | ICD-10-CM | POA: Insufficient documentation

## 2016-05-01 ENCOUNTER — Ambulatory Visit
Admission: RE | Admit: 2016-05-01 | Discharge: 2016-05-01 | Disposition: A | Discharge status: Home / Self Care | Payer: BLUE CROSS/BLUE SHIELD | Source: Ambulatory Visit | Attending: Endocrinology | Admitting: Endocrinology

## 2016-05-01 ENCOUNTER — Encounter: Payer: BLUE CROSS/BLUE SHIELD | Admitting: Endocrinology

## 2016-05-01 DIAGNOSIS — R112 Nausea with vomiting, unspecified: Secondary | ICD-10-CM

## 2016-05-01 MED ORDER — IOPAMIDOL (ISOVUE-300) INJECTION 61%
75.0000 mL | Freq: Once | INTRAVENOUS | Status: AC | PRN
  Administered 2016-05-01: 75 mL via INTRAVENOUS

## 2016-05-12 ENCOUNTER — Telehealth: Payer: Self-pay | Admitting: Endocrinology

## 2016-05-12 ENCOUNTER — Encounter: Payer: Self-pay | Admitting: Gastroenterology

## 2016-05-12 DIAGNOSIS — R112 Nausea with vomiting, unspecified: Secondary | ICD-10-CM

## 2016-05-12 NOTE — Telephone Encounter (Signed)
See message and please advise, Thanks!  

## 2016-05-12 NOTE — Telephone Encounter (Signed)
Patient is calling for the results of her scan, patient is still throwing up, please advise.

## 2016-05-12 NOTE — Telephone Encounter (Signed)
I contacted the patient and advised of message. Patient voiced understanding and had no further questions at this time.  

## 2016-05-12 NOTE — Telephone Encounter (Signed)
CT showed only the expected findings after your recent illness.  Please see a specialist for the nausea.  you will receive a phone call, about a day and time for an appointment

## 2016-05-13 ENCOUNTER — Ambulatory Visit (INDEPENDENT_AMBULATORY_CARE_PROVIDER_SITE_OTHER): Payer: BLUE CROSS/BLUE SHIELD | Admitting: Neurology

## 2016-05-13 ENCOUNTER — Encounter: Payer: Self-pay | Admitting: Neurology

## 2016-05-13 VITALS — BP 164/97 | HR 73 | Ht 66.0 in | Wt 226.4 lb

## 2016-05-13 DIAGNOSIS — I609 Nontraumatic subarachnoid hemorrhage, unspecified: Secondary | ICD-10-CM

## 2016-05-13 NOTE — Telephone Encounter (Signed)
See message and please advise, Thanks!  

## 2016-05-13 NOTE — Patient Instructions (Signed)
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 She is doing remarkably well with practically no neurological residual deficits. I recommend she maintain strict control of hypertension with blood pressure goal below 130/90. I advised her to follow-up with her primary physician for her recent nausea and vomiting issues and seek referral to a gastroenterologist. No routine schedule appointment is necessary to see me but she may be referred back in the future as needed.

## 2016-05-13 NOTE — Progress Notes (Signed)
[name] [address] [place]. [place] [address] [phone]       OFFICE FOLLOW-UP NOTE  Kelly. Charlott Wilson Date of Birth:  [birth year] Medical Record Number:  [id number]   HPI: Kelly Wilson is a 77 year Caucasian lady seen today for first office follow-up visit following admission for subarachnoid hemorrhage on 12/17/15 Kelly Wilson is an 60 y.o. female history of hypertension and hyperlipidemia, as well as TIA, presenting with new onset headache with nausea and mild vertigo starting at 6 PM 12/15/2015 ([facility]). She has a remote history of migraine headaches. This headache is different from any that she has previously experienced. CT scan of her head showed acute hemorrhage involving basilar cisterns, third and fourth ventricles and extending into lateral ventricles. Mild mass effect on brainstem was noted. Early hydrocephalus could not be ruled out. Patient has not experienced a change in speech nor facial droop. She also has not had experienced weakness no numbness involving extremities. Vision has remained unchanged. NIH stroke score at the time of this evaluation was 0. Patient was not administered IV t-PA secondary to IVH. She was admitted for further evaluation and treatment. CT angiogram showed 5 x 5 mm posterior inferior cerebellar artery aneurysm. Patient could not go for MRI due to history of stapedectomy. Patient underwent emergent surgical clipping of the PICA aneurysm by Dr. Kendrick Ranch, on 12/17/15 and subsequently scan showed mild hydrocephalus but patient remained neurologically stable and did not require further intervention. She had serial follow-up CT scans as well as transcranial Doppler studies did not develop significant vasospasm. She had mild hyponatremia which was treated with salt tablets. She had carotid ultrasound which showed no significant expectoration stenosis. Answers echo showed normal ejection fraction. EEG showed moderate encephalopathy with no  seizure activity. Patient has done well since discharge. She has finished outpatient therapies. Her headaches are significantly improved. She still has nausea and vomiting in fact she has seen her primary physician and his weight has been given Zofran and Prilosec without much benefit. She has been referred to [facility] but does not have an appointment to late December and would prefer to be seen soon. She denies any cognitive concerns memory issues double vision, gait or balance problems. Blood pressure is usually better and runs in the 0000000 to [id number] systolic range but today is elevated at 164/97 but patient's states that she did not take her medicine this morning because of nausea. She has no family history of cerebral aneurysms and she does not smoke. ROS:   14 system review of systems is positive for  nausea, vomiting, muscle cramps, dizziness, headache and all other systems negative  PMH:  Past Medical History:  Diagnosis Date  . GERD 08/09/2008  . 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 Hyperlipidemia   . HYPERLIPIDEMIA 05/17/2007  . Hypertension   . HYPERTENSION 05/17/2007  . Left ear hearing loss    Mild  . Muscle twitching   . OSTEOARTHRITIS 05/17/2007  . SPINAL STENOSIS 05/17/2007  . Stroke ([place])   . TRANSIENT ISCHEMIC ATTACK, HX OF 05/17/2007    Social History:  Social History   Social History  . Marital status: Married    Spouse name: N/A  . Number of children: N/A  . Years of education: N/A   Occupational History  . realtor    Social History Main Topics  . Smoking status: Never Smoker  . Smokeless tobacco: Never Used     Comment: Married, she is a Cabin crew  .  Alcohol use No  . Drug use: No  . Sexual activity: Not on file   Other Topics Concern  . Not on file   Social History Narrative   Married, Cabin crew, 2 daughters   Caffeine use: tea daily    Medications:   Current Outpatient Prescriptions on File Prior to Visit  Medication Sig Dispense Refill    . levETIRAcetam (KEPPRA) 500 MG tablet Take 1 tablet (500 mg total) by mouth 2 (two) times daily. 60 tablet 1  . omeprazole (PRILOSEC) 40 MG capsule Take 1 capsule (40 mg total) by mouth daily. 30 capsule 1  . ondansetron (ZOFRAN-ODT) 4 MG disintegrating tablet Take 1 tablet (4 mg total) by mouth every 8 (eight) hours as needed for nausea or vomiting. 20 tablet 2  . triamterene-hydrochlorothiazide (MAXZIDE-25) 37.5-25 MG tablet Take 0.5 tablets by mouth daily. 45 tablet 3   No current facility-administered medications on file prior to visit.     Allergies:   Allergies  Allergen Reactions  . Morphine Hives  . Rosuvastatin Other (See Comments)    myalgia    Physical Exam General: well developed, well nourished middle-age Caucasian lady, seated, in no evident distress Head: head normocephalic and atraumatic.  Neck: supple with no carotid or supraclavicular bruits Cardiovascular: regular rate and rhythm, no murmurs Musculoskeletal: no deformity Skin:  no rash/petichiae Vascular:  Normal pulses all extremities Vitals:   05/13/16 1125  BP: (!) 164/97  Pulse: 73   Neurologic Exam Mental Status: Awake and fully alert. Oriented to place and time. Recent and remote memory intact. Attention span, concentration and fund of knowledge appropriate. Mood and affect appropriate. Recall 3/3. Able to name 13 four legged animals. Cranial Nerves: Fundoscopic exam reveals sharp disc margins. Pupils equal, briskly reactive to light. Extraocular movements full without nystagmus. Visual fields full to confrontation. Hearing intact. Facial sensation intact. Face, tongue, palate moves normally and symmetrically.  Motor: Normal bulk and tone. Normal strength in all tested extremity muscles. Sensory.: intact to touch ,pinprick .position and vibratory sensation.  Coordination: Rapid alternating movements normal in all extremities. Finger-to-nose and heel-to-shin performed accurately bilaterally. Gait and  Station: Arises from chair without difficulty. Stance is normal. Gait demonstrates normal stride length and balance . Able to heel, toe and tandem walk without difficulty.  Reflexes: 1+ and symmetric. Toes downgoing.   NIHSS  0 Modified Rankin  1   ASSESSMENT: 56 year Caucasian lady with aneurysmal subarachnoid hemorrhage in May 2017 secondary to leaking posterior inferior cerebellar artery aneurysm status was surgical clipping of aneurysm in mild hydrocephalus is doing clinically quite well but near complete recovery.    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 She is doing remarkably well with practically no neurological residual deficits. I recommend she maintain strict control of hypertension with blood pressure goal below 130/90. I advised her to follow-up with her primary physician for her recent nausea and vomiting issues and seek referral to a gastroenterologist.Greater than 50% of time during this 25 minute visit was spent on counseling,explanation of diagnosis, planning of further management, discussion with patient and family and coordination of care No routine schedule appointment is necessary to see me but she may be referred back in the future as needed. Antony Contras, MD  [facility] [address] [place], [place] [address]  Phone [phone] Fax [phone]  Note: This document was prepared with digital dictation  and possible smart Company secretary. Any transcriptional errors that result from this process are unintentional

## 2016-05-13 NOTE — Telephone Encounter (Signed)
I contacted the patient and advised we have reached out the [facility] and requested other office to be checked on appointment times and dates. Patient voiced understanding on this and had no further questions at this time.

## 2016-05-13 NOTE — Telephone Encounter (Signed)
Patient family member called the [facility] stated that patient is vomiting  2 or three times a day and taking nausea medication. Please advise

## 2016-05-13 NOTE — Telephone Encounter (Signed)
Latanya, Could we try sending the referral to another practice since the earliest [facility] could see the patient is 07/09/2016?

## 2016-05-13 NOTE — Telephone Encounter (Signed)
Pt is has an appt with GI but they could not get her in until Dec 21st, she cannot wait that long is there anything else we can do, can we refer her elsewhere?

## 2016-05-13 NOTE — Telephone Encounter (Signed)
Please ask [facility] to try another practice

## 2016-05-14 ENCOUNTER — Ambulatory Visit: Payer: BLUE CROSS/BLUE SHIELD | Admitting: Neurology

## 2016-05-15 ENCOUNTER — Other Ambulatory Visit: Payer: Self-pay

## 2016-05-15 NOTE — Telephone Encounter (Signed)
I contacted the patient. She stated she had not heard from [name] about the possible referral to another [facility] office since the wait for a new patient appointment for Kelly Wilson is book until 07/09/2016. The patient stated she could not wait that long for an appointment. Laketa, could you advise the patient if a sooner appointment would be available at another office? Thanks!

## 2016-05-15 NOTE — Telephone Encounter (Signed)
Patient returning your call.

## 2016-05-21 ENCOUNTER — Telehealth: Payer: Self-pay | Admitting: Neurology

## 2016-05-21 NOTE — Telephone Encounter (Signed)
Patient called, states she's had nausea and vomiting for 3 months, having problems during physical therapy, states physical therapist advised of vestibular nerve that causes dizziness, asks if the dizziness could be causing the N&V/ Please call to advise.

## 2016-05-22 ENCOUNTER — Telehealth: Payer: Self-pay | Admitting: Endocrinology

## 2016-05-22 NOTE — Telephone Encounter (Signed)
Records sent to [facility] per request.

## 2016-05-22 NOTE — Telephone Encounter (Signed)
[facility] called and said they received referral to their GI dept and that they need this pt's GI notes before they can schedule.

## 2016-05-26 ENCOUNTER — Other Ambulatory Visit: Payer: Self-pay | Admitting: Endocrinology

## 2016-05-26 NOTE — Telephone Encounter (Signed)
Kindly inform the patient that I did not address this with her at the last office visit on 05/13/16 as it was not brought up and in case her primary care physician has ordered the therapy to discuss this with him

## 2016-05-26 NOTE — Telephone Encounter (Signed)
IF patient calls back she would need to contact the md who order the therapy for her. THE md who order the therapy needs to address her concerns of nausea, and vomiting. Per Dr. Leonie Man note this was not discuss at his last office visit with her. LFt vm for patient and gave her the above message from Dr. Mat Carne. Rn left number if she had any concerns.

## 2016-05-29 ENCOUNTER — Telehealth: Payer: Self-pay | Admitting: Endocrinology

## 2016-05-29 DIAGNOSIS — R112 Nausea with vomiting, unspecified: Secondary | ICD-10-CM

## 2016-05-29 NOTE — Telephone Encounter (Signed)
Pt called and said that she found a GI Doctor that could see her sooner that [facility].  She requests a referral be sent to there as soon as possible. [facility] Dr. Juanita Craver [phone]

## 2016-05-29 NOTE — Telephone Encounter (Signed)
Can we order? 

## 2016-05-29 NOTE — Telephone Encounter (Signed)
done

## 2016-06-01 NOTE — Telephone Encounter (Signed)
I contacted the patient and advised referral has been placed. Patient voiced understanding.

## 2016-06-01 NOTE — Telephone Encounter (Signed)
Alhana see messages to be advised.

## 2016-06-03 NOTE — Telephone Encounter (Signed)
Patient went to her dentist her b/p was 205/100, patient asked please advise what to do

## 2016-06-03 NOTE — Telephone Encounter (Signed)
bp check here within the next few days

## 2016-06-03 NOTE — Telephone Encounter (Signed)
Patient coming in tomorrow for her blood pressure check at 330 pm.

## 2016-06-03 NOTE — Telephone Encounter (Signed)
See message and please advise, Thanks!  

## 2016-06-04 ENCOUNTER — Encounter: Payer: Self-pay | Admitting: Endocrinology

## 2016-06-04 ENCOUNTER — Ambulatory Visit (INDEPENDENT_AMBULATORY_CARE_PROVIDER_SITE_OTHER): Payer: BLUE CROSS/BLUE SHIELD | Admitting: Endocrinology

## 2016-06-04 VITALS — BP 148/88 | HR 79 | Ht 66.0 in | Wt 224.0 lb

## 2016-06-04 DIAGNOSIS — I10 Essential (primary) hypertension: Secondary | ICD-10-CM

## 2016-06-04 MED ORDER — LOSARTAN POTASSIUM-HCTZ 50-12.5 MG PO TABS: 1.0000 | ORAL_TABLET | Freq: Every day | ORAL | 3 refills | Status: DC

## 2016-06-04 NOTE — Progress Notes (Signed)
Subjective:    Patient ID: Kelly Wilson, female    DOB: 1955/10/05, 60 y.o.   MRN: [id number]  HPI Pt ret for f/u of HTN.  It was noted to be worse a few days ago.  She takes maxzide as rx'ed.  pt states she feels well in general, except for slight headache.  Nausea persists.  EGD is planned.  Past Medical History:  Diagnosis Date  . GERD 08/09/2008  . 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 Hyperlipidemia   . HYPERLIPIDEMIA 05/17/2007  . Hypertension   . HYPERTENSION 05/17/2007  . Left ear hearing loss    Mild  . Muscle twitching   . OSTEOARTHRITIS 05/17/2007  . SPINAL STENOSIS 05/17/2007  . Stroke ([place])   . TRANSIENT ISCHEMIC ATTACK, HX OF 05/17/2007    Past Surgical History:  Procedure Laterality Date  . ABDOMINAL HYSTERECTOMY  1995  . ANKLE SURGERY  2005  . CESAREAN SECTION  1993  . CRANIOTOMY Right 12/17/2015   Procedure: Suboccipital CRANIectomy INTRACRANIAL ANEURYSM FOR VERTEBRAL/BASILAR;  Surgeon: Consuella Lose, MD;  Location: [facility];  Service: Neurosurgery;  Laterality: Right;  suboccipital  . EXTERNAL EAR SURGERY     left side x's 2  . KNEE SURGERY  02/2011  . ORIF ELBOW FRACTURE  02/2011  . SKIN GRAFT FULL THICKNESS ARM    . SKIN GRAFT FULL THICKNESS LEG    . STAPEDECTOMY    . throidectomy    . THYROIDECTOMY, PARTIAL Right 1980  . TUBAL LIGATION      Social History   Social History  . Marital status: Married    Spouse name: N/A  . Number of children: N/A  . Years of education: N/A   Occupational History  . realtor    Social History Main Topics  . Smoking status: Never Smoker  . Smokeless tobacco: Never Used     Comment: Married, she is a Cabin crew  . Alcohol use No  . Drug use: No  . Sexual activity: Not on file   Other Topics Concern  . Not on file   Social History Narrative   Married, Cabin crew, 2 daughters   Caffeine use: tea daily    Current Outpatient Prescriptions on File Prior to Visit  Medication Sig Dispense Refill  .  omeprazole (PRILOSEC) 40 MG capsule Take 1 capsule (40 mg total) by mouth daily. 30 capsule 1  . ondansetron (ZOFRAN-ODT) 4 MG disintegrating tablet DISSOLVE 1 TABLET ON THE TONGUE EVERY 8 HOURS AS NEEDED FOR NAUSEA OR VOMITING 20 tablet 2   No current facility-administered medications on file prior to visit.     Allergies  Allergen Reactions  . Morphine Hives  . Rosuvastatin Other (See Comments)    myalgia    Family History  Problem Relation Age of Onset  . Coronary artery disease Mother   . Diabetes Mother   . Coronary artery disease Father   . Diabetes Sister     2 sister with DM  . Cancer Other     uncle had rectal cancer  . Hypertension Other   . Hyperlipidemia Other     BP (!) 148/88   Pulse 79   Ht 5' 6"  (1.676 m)   Wt 224 lb (101.6 kg)   SpO2 97%   BMI 36.15 kg/m   Review of Systems Denies sob    Objective:   Physical Exam VITAL SIGNS:  See vs page GENERAL: no distress Ext: no edema.    i personally reviewed electrocardiogram tracing (  today): Indication: HTN Impression: normal to my reading.      Assessment & Plan:  HTN: she needs increased rx.  I have sent a prescription to your pharmacy.

## 2016-06-04 NOTE — Patient Instructions (Addendum)
I have sent a prescription to your pharmacy, for a blood pressure pill that replaces your current one.   Please have your blood pressure rechecked here next week.

## 2016-06-09 ENCOUNTER — Ambulatory Visit: Payer: BLUE CROSS/BLUE SHIELD

## 2016-06-09 VITALS — BP 142/74

## 2016-06-09 DIAGNOSIS — I10 Essential (primary) hypertension: Secondary | ICD-10-CM

## 2016-06-19 ENCOUNTER — Other Ambulatory Visit: Payer: Self-pay | Admitting: Gastroenterology

## 2016-06-19 DIAGNOSIS — R112 Nausea with vomiting, unspecified: Secondary | ICD-10-CM

## 2016-06-29 ENCOUNTER — Encounter (HOSPITAL_COMMUNITY)
Admission: RE | Admit: 2016-06-29 | Discharge: 2016-06-29 | Disposition: A | Discharge status: Home / Self Care | Payer: BLUE CROSS/BLUE SHIELD | Source: Ambulatory Visit | Attending: Gastroenterology | Admitting: Gastroenterology

## 2016-06-29 DIAGNOSIS — R112 Nausea with vomiting, unspecified: Secondary | ICD-10-CM | POA: Insufficient documentation

## 2016-07-02 ENCOUNTER — Ambulatory Visit (HOSPITAL_COMMUNITY)
Admission: RE | Admit: 2016-07-02 | Discharge: 2016-07-02 | Disposition: A | Discharge status: Home / Self Care | Payer: BLUE CROSS/BLUE SHIELD | Source: Ambulatory Visit | Attending: Gastroenterology | Admitting: Gastroenterology

## 2016-07-02 DIAGNOSIS — R112 Nausea with vomiting, unspecified: Secondary | ICD-10-CM | POA: Diagnosis present

## 2016-07-02 MED ORDER — TECHNETIUM TC 99M MEBROFENIN IV KIT: 5.5000 | PACK | Freq: Once | INTRAVENOUS | Status: DC | PRN

## 2016-07-09 ENCOUNTER — Ambulatory Visit: Payer: BLUE CROSS/BLUE SHIELD | Admitting: Gastroenterology

## 2016-08-01 ENCOUNTER — Encounter (HOSPITAL_COMMUNITY): Payer: Self-pay | Admitting: Emergency Medicine

## 2016-08-01 ENCOUNTER — Emergency Department (HOSPITAL_COMMUNITY)
Admission: EM | Admit: 2016-08-01 | Discharge: 2016-08-01 | Disposition: A | Discharge status: Home / Self Care | Payer: BLUE CROSS/BLUE SHIELD | Attending: Emergency Medicine | Admitting: Emergency Medicine

## 2016-08-01 DIAGNOSIS — Z5321 Procedure and treatment not carried out due to patient leaving prior to being seen by health care provider: Secondary | ICD-10-CM | POA: Diagnosis not present

## 2016-08-01 DIAGNOSIS — R109 Unspecified abdominal pain: Secondary | ICD-10-CM | POA: Diagnosis not present

## 2016-08-01 LAB — CBC
HEMATOCRIT: 40.6 % (ref 36.0–46.0)
HEMOGLOBIN: 14.2 g/dL (ref 12.0–15.0)
MCH: 28.8 pg (ref 26.0–34.0)
MCHC: 35 g/dL (ref 30.0–36.0)
MCV: 82.4 fL (ref 78.0–100.0)
Platelets: 303 10*3/uL (ref 150–400)
RBC: 4.93 MIL/uL (ref 3.87–5.11)
RDW: 13 % (ref 11.5–15.5)
WBC: 4.8 10*3/uL (ref 4.0–10.5)

## 2016-08-01 LAB — COMPREHENSIVE METABOLIC PANEL
ALBUMIN: 3.7 g/dL (ref 3.5–5.0)
ALT: 13 U/L — ABNORMAL LOW (ref 14–54)
ANION GAP: 14 (ref 5–15)
AST: 24 U/L (ref 15–41)
Alkaline Phosphatase: 62 U/L (ref 38–126)
BUN: 16 mg/dL (ref 6–20)
CHLORIDE: 105 mmol/L (ref 101–111)
CO2: 18 mmol/L — AB (ref 22–32)
Calcium: 9.4 mg/dL (ref 8.9–10.3)
Creatinine, Ser: 1.07 mg/dL — ABNORMAL HIGH (ref 0.44–1.00)
GFR calc Af Amer: >60 mL/min (ref >60)
GFR calc non Af Amer: 54 mL/min — ABNORMAL LOW (ref >60)
GLUCOSE: 112 mg/dL — AB (ref 65–99)
POTASSIUM: 3.7 mmol/L (ref 3.5–5.1)
SODIUM: 137 mmol/L (ref 135–145)
Total Bilirubin: 0.2 mg/dL — ABNORMAL LOW (ref 0.3–1.2)
Total Protein: 5.9 g/dL — ABNORMAL LOW (ref 6.5–8.1)

## 2016-08-01 LAB — URINALYSIS, ROUTINE W REFLEX MICROSCOPIC
BACTERIA UA: NONE SEEN
BILIRUBIN URINE: NEGATIVE
Glucose, UA: NEGATIVE mg/dL
HGB URINE DIPSTICK: NEGATIVE
Ketones, ur: NEGATIVE mg/dL
NITRITE: NEGATIVE
PROTEIN: 30 mg/dL — AB
Specific Gravity, Urine: 1.023 (ref 1.005–1.030)
pH: 8 (ref 5.0–8.0)

## 2016-08-01 LAB — LIPASE, BLOOD: LIPASE: 37 U/L (ref 11–51)

## 2016-08-01 MED ORDER — ONDANSETRON 4 MG PO TBDP
8.0000 mg | ORAL_TABLET | Freq: Once | ORAL | Status: AC | PRN
Start: 2016-08-01 — End: 2016-08-01
  Administered 2016-08-01: 8 mg via ORAL

## 2016-08-01 MED ORDER — ONDANSETRON 4 MG PO TBDP
ORAL_TABLET | ORAL | Status: AC
  Filled 2016-08-01: qty 1

## 2016-08-01 NOTE — ED Notes (Signed)
Pt provided stickers to registration and told them they were leaving.  Did not speak to RN.  Will d/c

## 2016-08-01 NOTE — ED Triage Notes (Signed)
Patient here with abdominal pain.  She states that her gall bladder is not working properly, has had a work up with Dr Collene Mares.  Patient has not seen a surgeon at this time.  Patient does have nausea, no vomiting.  Patient is fairly uncomfortable at this time.

## 2016-08-07 ENCOUNTER — Other Ambulatory Visit (HOSPITAL_COMMUNITY): Payer: Self-pay | Admitting: General Surgery

## 2016-08-07 DIAGNOSIS — R110 Nausea: Secondary | ICD-10-CM

## 2016-08-12 ENCOUNTER — Ambulatory Visit (HOSPITAL_COMMUNITY)
Admission: RE | Admit: 2016-08-12 | Discharge: 2016-08-12 | Disposition: A | Discharge status: Home / Self Care | Payer: BLUE CROSS/BLUE SHIELD | Source: Ambulatory Visit | Attending: General Surgery | Admitting: General Surgery

## 2016-08-12 DIAGNOSIS — R110 Nausea: Secondary | ICD-10-CM | POA: Diagnosis not present

## 2016-08-12 MED ORDER — TECHNETIUM TC 99M SULFUR COLLOID
2.0000 | Freq: Once | INTRAVENOUS | Status: AC | PRN
  Administered 2016-08-12: 2 via ORAL

## 2016-08-21 NOTE — Therapy (Signed)
[facility] [address] [place], [place], [address] Phone: [phone]   Fax:  [phone]  Patient Details  Name: Kelly Wilson MRN: [id number] Date of Birth: 04/06/1956 Referring Provider:  No ref. provider found  Encounter Date: 08/21/2016  SPEECH THERAPY DISCHARGE SUMMARY  Visits from Start of Care: <10  Current functional level related to goals / functional outcomes: Pt did not return after her visit on 03-19-16. Goals at that time were: SLP Short Term Goals - 03/17/16 1726              SLP SHORT TERM GOAL #1    Title pt will demo emergent awareness in mod complex cognitive linguistic tasks 85% of opportunities    Time 1    Period Weeks    Status Not Met         SLP SHORT TERM GOAL #2    Title pt will demo attention to detail in mod complex cognitive linguistic tasks to obtain 95% accuracy with self correction allowed    Time 1    Period Weeks    Status Not Met         SLP SHORT TERM GOAL #3    Title pt will demo problem solving/reasoning skills in mod complex cognitive linguistic tasks with rare min A    Time 1    Period Weeks    Status Partially Met         SLP SHORT TERM GOAL #4    Title In written tasks, pt will demo awareness of written errors with min cues, occasionally    Time 1    Period Weeks    Status Deferred  to work with cognitive goals                       SLP Long Term Goals - 03/19/16 0933              SLP LONG TERM GOAL #1    Title pt will demo anticipatory awareness (i.e., double-checking more detailed work) in four sessions    Baseline one session 03-10-16    Time 4    Period Weeks    Status On-going         SLP LONG TERM GOAL #2    Title  pt will demo attention to detail by achieving 100% success with mod complex-complex cognitive linguistic tasks with compensation (double checking work) in four sessions    Time 4    Period Weeks    Status On-going         SLP  LONG TERM GOAL #3    Title In mod complex cognitive linguistic tasks, pt will show divided attention 80% of the time, with 90% accuracy on both tasks over two sessions    Time 4    Period Weeks    Status On-going         SLP LONG TERM GOAL #4    Title in written tasks, pt will correct written aphasia errors independently    Status Deferred  aphasia in writing has resovd         Remaining deficits: SLP assumes deficits remain, however pt did not return to therapy so nothing is definite about this.   Education / Equipment: Compensations for cognitive linguistics.  Plan: Patient agrees to discharge.  Patient goals were partially met. Patient is being discharged due to not returning since the last visit.  ?????          , ,MS, CCC-SLP  08/21/2016, 2:09 PM  [facility] [address] [place], [place], [address] Phone: [phone]   Fax:  [phone] 

## 2016-10-09 ENCOUNTER — Other Ambulatory Visit: Payer: Self-pay | Admitting: General Surgery

## 2016-11-05 ENCOUNTER — Other Ambulatory Visit: Payer: Self-pay | Admitting: Endocrinology

## 2016-12-04 ENCOUNTER — Other Ambulatory Visit: Payer: Self-pay | Admitting: Endocrinology

## 2016-12-04 DIAGNOSIS — R112 Nausea with vomiting, unspecified: Secondary | ICD-10-CM

## 2016-12-04 NOTE — Telephone Encounter (Signed)
Patient is still getting nauseous

## 2016-12-04 NOTE — Telephone Encounter (Signed)
Is this medicine okay to refill? Please advise. Thank you!

## 2016-12-04 NOTE — Telephone Encounter (Signed)
Called patient and left a voice message to call our office back. Need to know if she is still taking the Ondansetron for nausea and why she is taking.

## 2016-12-04 NOTE — Telephone Encounter (Signed)
please call patient: I need to know what this is for.

## 2016-12-04 NOTE — Telephone Encounter (Signed)
Patient is still getting nauseous per her response.

## 2016-12-04 NOTE — Telephone Encounter (Signed)
I have sent a prescription to your pharmacy, to refill.  Please also see a specialist.  you will receive a phone call, about a day and time for an appointment.

## 2016-12-29 ENCOUNTER — Encounter (INDEPENDENT_AMBULATORY_CARE_PROVIDER_SITE_OTHER): Payer: Self-pay | Admitting: Orthopaedic Surgery

## 2016-12-29 ENCOUNTER — Ambulatory Visit (INDEPENDENT_AMBULATORY_CARE_PROVIDER_SITE_OTHER): Payer: BLUE CROSS/BLUE SHIELD | Admitting: Orthopaedic Surgery

## 2016-12-29 ENCOUNTER — Ambulatory Visit (INDEPENDENT_AMBULATORY_CARE_PROVIDER_SITE_OTHER): Payer: Self-pay

## 2016-12-29 DIAGNOSIS — M25512 Pain in left shoulder: Secondary | ICD-10-CM

## 2016-12-29 DIAGNOSIS — G8929 Other chronic pain: Secondary | ICD-10-CM

## 2016-12-29 MED ORDER — METHYLPREDNISOLONE ACETATE 40 MG/ML IJ SUSP
40.0000 mg | INTRAMUSCULAR | Status: AC | PRN
  Administered 2016-12-29: 40 mg via INTRA_ARTICULAR

## 2016-12-29 MED ORDER — BUPIVACAINE HCL 0.5 % IJ SOLN
3.0000 mL | INTRAMUSCULAR | Status: AC | PRN
  Administered 2016-12-29: 3 mL via INTRA_ARTICULAR

## 2016-12-29 MED ORDER — LIDOCAINE HCL 1 % IJ SOLN
3.0000 mL | INTRAMUSCULAR | Status: AC | PRN
  Administered 2016-12-29: 3 mL

## 2016-12-29 NOTE — Progress Notes (Signed)
Office Visit Note   Patient: Kelly Wilson           Date of Birth: 08/06/1955           MRN: [id number] Visit Date: 12/29/2016              Requested by: [name], MD [address] [place], [place] [address] PCP: [name], MD   Assessment & Plan: Visit Diagnoses:  1. Chronic left shoulder pain     Plan: Impression is left shoulder impingement/subacromial bursitis. Injection was performed today. If not better should follow up and consider advanced imaging.  Follow-Up Instructions: Return if symptoms worsen or fail to improve.   Orders:  Orders Placed This Encounter  Procedures  . XR Shoulder Left   No orders of the defined types were placed in this encounter.     Procedures: Large Joint Inj Date/Time: 12/29/2016 8:23 AM Performed by: Leandrew Koyanagi Authorized by: Leandrew Koyanagi   Consent Given by:  Patient Timeout: prior to procedure the correct patient, procedure, and site was verified   Location:  Shoulder Site:  L subacromial bursa Prep: patient was prepped and draped in usual sterile fashion   Needle Size:  22 G Approach:  Posterior Ultrasound Guidance: No   Fluoroscopic Guidance: No   Arthrogram: No   Medications:  3 mL lidocaine 1 %; 3 mL bupivacaine 0.5 %; 40 mg methylPREDNISolone acetate 40 MG/ML     Clinical Data: No additional findings.   Subjective: Chief Complaint  Patient presents with  . Left Shoulder - Pain    Patient is a 61 year old female with 3-4 month history of left shoulder pain without any injuries. She states her pain is in the deltoid region. She denies any radicular symptoms. She has tried ibuprofen and naproxen which helped partially. She is continuing to have pain.    Review of Systems  Constitutional: Negative.   HENT: Negative.   Eyes: Negative.   Respiratory: Negative.   Cardiovascular: Negative.   Endocrine: Negative.   Musculoskeletal: Negative.   Neurological: Negative.     Hematological: Negative.   Psychiatric/Behavioral: Negative.   All other systems reviewed and are negative.    Objective: Vital Signs: There were no vitals taken for this visit.  Physical Exam  Constitutional: She is oriented to person, place, and time. She appears well-developed and well-nourished.  HENT:  Head: Normocephalic and atraumatic.  Eyes: EOM are normal.  Neck: Neck supple.  Pulmonary/Chest: Effort normal.  Abdominal: Soft.  Neurological: She is alert and oriented to person, place, and time.  Skin: Skin is warm. Capillary refill takes less than 2 seconds.  Psychiatric: She has a normal mood and affect. Her behavior is normal. Judgment and thought content normal.  Nursing note and vitals reviewed.   Ortho Exam Left shoulder exam shows normal range of motion. She has positive impingement signs. Rotator cuff is grossly intact but has mild pain. There is no real significant weakness with rotator cuff testing. Positive cross adduction sign. Bicipital groove is mildly tender. Specialty Comments:  No specialty comments available.  Imaging: Xr Shoulder Left  Result Date: 12/29/2016 Degenerative acromioclavicular joint.  No evidence of glenohumeral arthritis    PMFS History: Patient Active Problem List   Diagnosis Date Noted  . Nausea with vomiting 04/20/2016  . Leukopenia   . Bradycardia   . Acute lower UTI   . Agitation   . Lethargic   . Lethargy   . Bilateral  headaches   . Other vascular headache   . Hypokalemia   . Heat rash   . Poor appetite   . Hyponatremia   . Cephalalgia   . Obstructive hydrocephalus 12/17/2015  . 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 Headache 12/17/2015  . IVH (intraventricular hemorrhage) ([place]) 12/16/2015  . Chest pain 05/10/2015  . Precordial chest pain 05/10/2015  . Chest wall pain 05/10/2015  . Pain in the chest   . Wellness examination 01/24/2015  . Myalgia 11/14/2014  . Muscle cramps 11/14/2014   . Pinched nerve 08/28/2014  . Routine general medical examination at a health care facility 08/12/2013  . Cough 04/21/2012  . Lymphadenitis, acute 10/23/2011  . GERD 08/09/2008  . OTHER DYSPHAGIA 06/18/2008  . 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 NECK DISORDER 05/22/2008  . HEADACHE 08/16/2007  . OTITIS MEDIA, ACUTE, LEFT 08/11/2007  . OTHER ABNORMAL BLOOD CHEMISTRY 07/25/2007  . HYPERLIPIDEMIA 05/17/2007  . Essential hypertension 05/17/2007  . OSTEOARTHRITIS 05/17/2007  . SPINAL STENOSIS 05/17/2007  . History of cardiovascular disorder 05/17/2007   Past Medical History:  Diagnosis Date  . GERD 08/09/2008  . 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 Hyperlipidemia   . HYPERLIPIDEMIA 05/17/2007  . Hypertension   . HYPERTENSION 05/17/2007  . Left ear hearing loss    Mild  . Muscle twitching   . OSTEOARTHRITIS 05/17/2007  . SPINAL STENOSIS 05/17/2007  . Stroke ([place])   . TRANSIENT ISCHEMIC ATTACK, HX OF 05/17/2007    Family History  Problem Relation Age of Onset  . Coronary artery disease Mother   . Diabetes Mother   . Coronary artery disease Father   . Diabetes Sister        2 sister with DM  . Cancer Other        uncle had rectal cancer  . Hypertension Other   . Hyperlipidemia Other     Past Surgical History:  Procedure Laterality Date  . ABDOMINAL HYSTERECTOMY  1995  . ANKLE SURGERY  2005  . CESAREAN SECTION  1993  . CRANIOTOMY Right 12/17/2015   Procedure: Suboccipital CRANIectomy INTRACRANIAL ANEURYSM FOR VERTEBRAL/BASILAR;  Surgeon: Consuella Lose, MD;  Location: [facility];  Service: Neurosurgery;  Laterality: Right;  suboccipital  . EXTERNAL EAR SURGERY     left side x's 2  . KNEE SURGERY  02/2011  . ORIF ELBOW FRACTURE  02/2011  . SKIN GRAFT FULL THICKNESS ARM    . SKIN GRAFT FULL THICKNESS LEG    . STAPEDECTOMY    . throidectomy    . THYROIDECTOMY, PARTIAL Right 1980  . TUBAL LIGATION     Social History   Occupational History  . realtor    Social  History Main Topics  . Smoking status: Never Smoker  . Smokeless tobacco: Never Used     Comment: Married, she is a Cabin crew  . Alcohol use No  . Drug use: No  . Sexual activity: Not on file

## 2017-01-12 ENCOUNTER — Telehealth (INDEPENDENT_AMBULATORY_CARE_PROVIDER_SITE_OTHER): Payer: Self-pay | Admitting: Orthopaedic Surgery

## 2017-01-12 MED ORDER — PREDNISONE 5 MG (21) PO TBPK: ORAL_TABLET | ORAL | 0 refills | Status: DC

## 2017-01-12 NOTE — Telephone Encounter (Signed)
Medrol Dosepak. If not better we'll need to get MRI

## 2017-01-12 NOTE — Telephone Encounter (Signed)
Please advise 

## 2017-01-12 NOTE — Addendum Note (Signed)
Addended byLaurann Montana on: 01/12/2017 03:56 PM   Modules accepted: Orders

## 2017-01-12 NOTE — Telephone Encounter (Signed)
Patient called asking for a medication refill on prednisone. She received a injection in her shoulder a few weeks ago and it didn't really help with the pain. CB # [id number]

## 2017-01-12 NOTE — Telephone Encounter (Signed)
I sent in rx and patient advised but she wanted me to let you know that she cannot have an MRI due to metal in her ear.

## 2017-01-19 ENCOUNTER — Other Ambulatory Visit: Payer: Self-pay | Admitting: Neurosurgery

## 2017-01-19 ENCOUNTER — Other Ambulatory Visit (HOSPITAL_COMMUNITY): Payer: Self-pay | Admitting: Neurosurgery

## 2017-01-19 DIAGNOSIS — I609 Nontraumatic subarachnoid hemorrhage, unspecified: Secondary | ICD-10-CM

## 2017-02-09 ENCOUNTER — Telehealth (INDEPENDENT_AMBULATORY_CARE_PROVIDER_SITE_OTHER): Payer: Self-pay

## 2017-02-09 NOTE — Telephone Encounter (Signed)
Pt states that she had an injection in her shoulder several weeks ago. The pain has not resolved and she is wanting to know what her next steps should be. Please call [phone]

## 2017-02-09 NOTE — Telephone Encounter (Signed)
Please advise 

## 2017-02-09 NOTE — Telephone Encounter (Signed)
Scheduled appt for friday

## 2017-02-09 NOTE — Telephone Encounter (Signed)
Follow up appt.

## 2017-02-12 ENCOUNTER — Ambulatory Visit (INDEPENDENT_AMBULATORY_CARE_PROVIDER_SITE_OTHER): Payer: BLUE CROSS/BLUE SHIELD | Admitting: Orthopaedic Surgery

## 2017-02-12 ENCOUNTER — Encounter (INDEPENDENT_AMBULATORY_CARE_PROVIDER_SITE_OTHER): Payer: Self-pay | Admitting: Orthopaedic Surgery

## 2017-02-12 DIAGNOSIS — M25512 Pain in left shoulder: Secondary | ICD-10-CM | POA: Diagnosis not present

## 2017-02-12 DIAGNOSIS — G8929 Other chronic pain: Secondary | ICD-10-CM | POA: Diagnosis not present

## 2017-02-12 NOTE — Progress Notes (Signed)
Office Visit Note   Patient: Kelly Wilson           Date of Birth: 15-Apr-1956           MRN: [id number] Visit Date: 02/12/2017              Requested by: [name], MD [address] [place], [place] [address] PCP: [name], MD   Assessment & Plan: Visit Diagnoses:  1. Chronic left shoulder pain     Plan: patient has implants in ear.  Will obtain CT arthrogram of left shoulder to eval for RCT and structural abnormalities  Follow-Up Instructions: Return in about 10 days (around 02/22/2017).   Orders:  Orders Placed This Encounter  Procedures  . CT SHOULDER LEFT WO CONTRAST   No orders of the defined types were placed in this encounter.     Procedures: No procedures performed   Clinical Data: No additional findings.   Subjective: Chief Complaint  Patient presents with  . Left Shoulder - Pain    Patient f/u today for continued left shoulder pain.  Injection given on 12/29/16 has not helped.  Prednisone taper has not helped.  Pain is worse with movement and use of arm.  Denies n/t.    Review of Systems  Constitutional: Negative.   HENT: Negative.   Eyes: Negative.   Respiratory: Negative.   Cardiovascular: Negative.   Endocrine: Negative.   Musculoskeletal: Negative.   Neurological: Negative.   Hematological: Negative.   Psychiatric/Behavioral: Negative.   All other systems reviewed and are negative.    Objective: Vital Signs: There were no vitals taken for this visit.  Physical Exam  Constitutional: She is oriented to person, place, and time. She appears well-developed and well-nourished.  Pulmonary/Chest: Effort normal.  Neurological: She is alert and oriented to person, place, and time.  Skin: Skin is warm. Capillary refill takes less than 2 seconds.  Psychiatric: She has a normal mood and affect. Her behavior is normal. Judgment and thought content normal.  Nursing note and vitals reviewed.   Ortho Exam Left shoulder  exam + impingement + Speed - O'Brien RC function intact with moderate pain  Specialty Comments:  No specialty comments available.  Imaging: No results found.   PMFS History: Patient Active Problem List   Diagnosis Date Noted  . Nausea with vomiting 04/20/2016  . Leukopenia   . Bradycardia   . Acute lower UTI   . Agitation   . Lethargic   . Lethargy   . Bilateral headaches   . Other vascular headache   . Hypokalemia   . Heat rash   . Poor appetite   . Hyponatremia   . Cephalalgia   . Obstructive hydrocephalus 12/17/2015  . 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 Headache 12/17/2015  . IVH (intraventricular hemorrhage) ([place]) 12/16/2015  . Chest pain 05/10/2015  . Precordial chest pain 05/10/2015  . Chest wall pain 05/10/2015  . Pain in the chest   . Wellness examination 01/24/2015  . Myalgia 11/14/2014  . Muscle cramps 11/14/2014  . Pinched nerve 08/28/2014  . Routine general medical examination at a health care facility 08/12/2013  . Cough 04/21/2012  . Lymphadenitis, acute 10/23/2011  . GERD 08/09/2008  . OTHER DYSPHAGIA 06/18/2008  . 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 NECK DISORDER 05/22/2008  . HEADACHE 08/16/2007  . OTITIS MEDIA, ACUTE, LEFT 08/11/2007  . OTHER ABNORMAL BLOOD CHEMISTRY 07/25/2007  . HYPERLIPIDEMIA 05/17/2007  . Essential hypertension 05/17/2007  .  OSTEOARTHRITIS 05/17/2007  . SPINAL STENOSIS 05/17/2007  . History of cardiovascular disorder 05/17/2007   Past Medical History:  Diagnosis Date  . GERD 08/09/2008  . 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 Hyperlipidemia   . HYPERLIPIDEMIA 05/17/2007  . Hypertension   . HYPERTENSION 05/17/2007  . Left ear hearing loss    Mild  . Muscle twitching   . OSTEOARTHRITIS 05/17/2007  . SPINAL STENOSIS 05/17/2007  . Stroke ([place])   . TRANSIENT ISCHEMIC ATTACK, HX OF 05/17/2007    Family History  Problem Relation Age of Onset  . Coronary artery disease Mother   . Diabetes  Mother   . Coronary artery disease Father   . Diabetes Sister        2 sister with DM  . Cancer Other        uncle had rectal cancer  . Hypertension Other   . Hyperlipidemia Other     Past Surgical History:  Procedure Laterality Date  . ABDOMINAL HYSTERECTOMY  1995  . ANKLE SURGERY  2005  . CESAREAN SECTION  1993  . CRANIOTOMY Right 12/17/2015   Procedure: Suboccipital CRANIectomy INTRACRANIAL ANEURYSM FOR VERTEBRAL/BASILAR;  Surgeon: Consuella Lose, MD;  Location: [facility];  Service: Neurosurgery;  Laterality: Right;  suboccipital  . EXTERNAL EAR SURGERY     left side x's 2  . KNEE SURGERY  02/2011  . ORIF ELBOW FRACTURE  02/2011  . SKIN GRAFT FULL THICKNESS ARM    . SKIN GRAFT FULL THICKNESS LEG    . STAPEDECTOMY    . throidectomy    . THYROIDECTOMY, PARTIAL Right 1980  . TUBAL LIGATION     Social History   Occupational History  . realtor    Social History Main Topics  . Smoking status: Never Smoker  . Smokeless tobacco: Never Used     Comment: Married, she is a Cabin crew  . Alcohol use No  . Drug use: No  . Sexual activity: Not on file

## 2017-02-15 ENCOUNTER — Other Ambulatory Visit: Payer: Self-pay | Admitting: General Surgery

## 2017-02-16 ENCOUNTER — Encounter (HOSPITAL_COMMUNITY): Payer: Self-pay | Admitting: Neurosurgery

## 2017-02-16 ENCOUNTER — Other Ambulatory Visit (HOSPITAL_COMMUNITY): Payer: Self-pay | Admitting: Neurosurgery

## 2017-02-16 ENCOUNTER — Ambulatory Visit (HOSPITAL_COMMUNITY)
Admission: RE | Admit: 2017-02-16 | Discharge: 2017-02-16 | Disposition: A | Discharge status: Home / Self Care | Payer: BLUE CROSS/BLUE SHIELD | Source: Ambulatory Visit | Attending: Neurosurgery | Admitting: Neurosurgery

## 2017-02-16 DIAGNOSIS — Z8673 Personal history of transient ischemic attack (TIA), and cerebral infarction without residual deficits: Secondary | ICD-10-CM | POA: Diagnosis not present

## 2017-02-16 DIAGNOSIS — Z8679 Personal history of other diseases of the circulatory system: Secondary | ICD-10-CM | POA: Insufficient documentation

## 2017-02-16 DIAGNOSIS — K219 Gastro-esophageal reflux disease without esophagitis: Secondary | ICD-10-CM | POA: Diagnosis not present

## 2017-02-16 DIAGNOSIS — Z885 Allergy status to narcotic agent status: Secondary | ICD-10-CM | POA: Diagnosis not present

## 2017-02-16 DIAGNOSIS — Z48812 Encounter for surgical aftercare following surgery on the circulatory system: Secondary | ICD-10-CM | POA: Diagnosis present

## 2017-02-16 DIAGNOSIS — E785 Hyperlipidemia, unspecified: Secondary | ICD-10-CM | POA: Diagnosis not present

## 2017-02-16 DIAGNOSIS — I609 Nontraumatic subarachnoid hemorrhage, unspecified: Secondary | ICD-10-CM

## 2017-02-16 DIAGNOSIS — M1990 Unspecified osteoarthritis, unspecified site: Secondary | ICD-10-CM | POA: Insufficient documentation

## 2017-02-16 DIAGNOSIS — I10 Essential (primary) hypertension: Secondary | ICD-10-CM | POA: Diagnosis not present

## 2017-02-16 HISTORY — PX: IR ANGIO INTRA EXTRACRAN SEL INTERNAL CAROTID BILAT MOD SED: IMG5363

## 2017-02-16 HISTORY — PX: IR ANGIO VERTEBRAL SEL VERTEBRAL BILAT MOD SED: IMG5369

## 2017-02-16 LAB — BASIC METABOLIC PANEL
ANION GAP: 8 (ref 5–15)
BUN: 14 mg/dL (ref 6–20)
CHLORIDE: 102 mmol/L (ref 101–111)
CO2: 25 mmol/L (ref 22–32)
Calcium: 9.5 mg/dL (ref 8.9–10.3)
Creatinine, Ser: 1.01 mg/dL — ABNORMAL HIGH (ref 0.44–1.00)
GFR calc Af Amer: >60 mL/min (ref >60)
GFR calc non Af Amer: 58 mL/min — ABNORMAL LOW (ref >60)
GLUCOSE: 95 mg/dL (ref 65–99)
POTASSIUM: 3.3 mmol/L — AB (ref 3.5–5.1)
Sodium: 135 mmol/L (ref 135–145)

## 2017-02-16 LAB — URINALYSIS, ROUTINE W REFLEX MICROSCOPIC
Bilirubin Urine: NEGATIVE
GLUCOSE, UA: NEGATIVE mg/dL
HGB URINE DIPSTICK: NEGATIVE
Ketones, ur: 5 mg/dL — AB
LEUKOCYTES UA: NEGATIVE
Nitrite: NEGATIVE
PROTEIN: NEGATIVE mg/dL
Specific Gravity, Urine: 1.011 (ref 1.005–1.030)
pH: 7 (ref 5.0–8.0)

## 2017-02-16 LAB — CBC
HEMATOCRIT: 42.6 % (ref 36.0–46.0)
HEMOGLOBIN: 15 g/dL (ref 12.0–15.0)
MCH: 29.4 pg (ref 26.0–34.0)
MCHC: 35.2 g/dL (ref 30.0–36.0)
MCV: 83.5 fL (ref 78.0–100.0)
Platelets: 277 10*3/uL (ref 150–400)
RBC: 5.1 MIL/uL (ref 3.87–5.11)
RDW: 13.4 % (ref 11.5–15.5)
WBC: 5.8 10*3/uL (ref 4.0–10.5)

## 2017-02-16 LAB — PROTIME-INR
INR: 1.07
Prothrombin Time: 13.9 seconds (ref 11.4–15.2)

## 2017-02-16 LAB — APTT: aPTT: 33 seconds (ref 24–36)

## 2017-02-16 MED ORDER — MIDAZOLAM HCL 2 MG/2ML IJ SOLN
INTRAMUSCULAR | Status: AC | PRN
  Administered 2017-02-16: 1 mg via INTRAVENOUS

## 2017-02-16 MED ORDER — SODIUM CHLORIDE 0.9 % IV SOLN: INTRAVENOUS | Status: DC

## 2017-02-16 MED ORDER — HEPARIN SODIUM (PORCINE) 1000 UNIT/ML IJ SOLN
INTRAMUSCULAR | Status: AC
  Filled 2017-02-16: qty 2

## 2017-02-16 MED ORDER — LIDOCAINE HCL 1 % IJ SOLN
INTRAMUSCULAR | Status: AC | PRN
  Administered 2017-02-16: 10 mL

## 2017-02-16 MED ORDER — HYDROCODONE-ACETAMINOPHEN 5-325 MG PO TABS: 1.0000 | ORAL_TABLET | ORAL | Status: DC | PRN

## 2017-02-16 MED ORDER — FENTANYL CITRATE (PF) 100 MCG/2ML IJ SOLN
INTRAMUSCULAR | Status: AC
  Filled 2017-02-16: qty 2

## 2017-02-16 MED ORDER — IOPAMIDOL (ISOVUE-300) INJECTION 61%
INTRAVENOUS | Status: AC
  Administered 2017-02-16: 45 mL
  Filled 2017-02-16: qty 100

## 2017-02-16 MED ORDER — SODIUM CHLORIDE 0.9 % IV SOLN
INTRAVENOUS | Status: DC
  Administered 2017-02-16: 09:00:00 via INTRAVENOUS

## 2017-02-16 MED ORDER — FENTANYL CITRATE (PF) 100 MCG/2ML IJ SOLN
INTRAMUSCULAR | Status: AC | PRN
  Administered 2017-02-16: 25 ug via INTRAVENOUS

## 2017-02-16 MED ORDER — LIDOCAINE HCL (PF) 1 % IJ SOLN
INTRAMUSCULAR | Status: AC
  Filled 2017-02-16: qty 30

## 2017-02-16 MED ORDER — HEPARIN SODIUM (PORCINE) 1000 UNIT/ML IJ SOLN
INTRAMUSCULAR | Status: AC | PRN
  Administered 2017-02-16: 2000 [IU] via INTRAVENOUS

## 2017-02-16 MED ORDER — MIDAZOLAM HCL 2 MG/2ML IJ SOLN
INTRAMUSCULAR | Status: AC
  Filled 2017-02-16: qty 2

## 2017-02-16 NOTE — Sedation Documentation (Signed)
Patient is resting comfortably. 

## 2017-02-16 NOTE — H&P (Signed)
CC:  History of aneurysm rupture  HPI: Kelly Wilson is a 61 y.o. 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 She made an excellent recovery and presents today for routine f/u angiogram.  PMH: Past Medical History:  Diagnosis Date  . GERD 08/09/2008  . 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 Hyperlipidemia   . HYPERLIPIDEMIA 05/17/2007  . Hypertension   . HYPERTENSION 05/17/2007  . Left ear hearing loss    Mild  . Muscle twitching   . OSTEOARTHRITIS 05/17/2007  . SPINAL STENOSIS 05/17/2007  . Stroke ([facility])   . TRANSIENT ISCHEMIC ATTACK, HX OF 05/17/2007    PSH: Past Surgical History:  Procedure Laterality Date  . ABDOMINAL HYSTERECTOMY  1995  . ANKLE SURGERY  2005  . CESAREAN SECTION  1993  . CRANIOTOMY Right 12/17/2015   Procedure: Suboccipital CRANIectomy INTRACRANIAL ANEURYSM FOR VERTEBRAL/BASILAR;  Surgeon: Consuella Lose, MD;  Location: [facility];  Service: Neurosurgery;  Laterality: Right;  suboccipital  . EXTERNAL EAR SURGERY     left side x's 2  . KNEE SURGERY  02/2011  . ORIF ELBOW FRACTURE  02/2011  . SKIN GRAFT FULL THICKNESS ARM    . SKIN GRAFT FULL THICKNESS LEG    . STAPEDECTOMY    . throidectomy    . THYROIDECTOMY, PARTIAL Right 1980  . TUBAL LIGATION      SH: Social History  Substance Use Topics  . Smoking status: Never Smoker  . Smokeless tobacco: Never Used     Comment: Married, she is a Cabin crew  . Alcohol use No    MEDS: Prior to Admission medications   Medication Sig Start Date End Date Taking? Authorizing Provider  acetaminophen (TYLENOL) 500 MG tablet Take 1,500 mg by mouth 2 (two) times daily as needed (for pain/shoulder pain.).   Yes [provider]  losartan-hydrochlorothiazide Konrad Penta) 50-12.5 MG tablet take 1 tablet by mouth once daily 11/05/16  Yes [name], MD  Magnesium 500 MG TABS Take 500 mg by mouth daily.   Yes [provider]  Omega-3 Fatty Acids (FISH OIL PO) Take 720 mg by mouth daily.   Yes [provider]  omeprazole (PRILOSEC) 40 MG capsule Take 1 capsule (40 mg total) by mouth daily. 01/15/16  Yes Angiulli, Lavon Paganini, PA-C  ondansetron (ZOFRAN-ODT) 4 MG disintegrating tablet DISSOLVE 1 TABLET ON THE TONGUE EVERY 8 HOURS AS NEEDED FOR NAUSEA OR VOMITING 12/04/16  Yes [name], MD  predniSONE (STERAPRED UNI-PAK 21 TAB) 5 MG (21) TBPK tablet Take as directed Patient not taking: Reported on 02/12/2017 01/12/17   Leandrew Koyanagi, MD    ALLERGY: Allergies  Allergen Reactions  . Morphine Hives  . Rosuvastatin Other (See Comments)    myalgia    ROS: ROS  NEUROLOGIC EXAM: Awake, alert, oriented Memory and concentration grossly intact Speech fluent, appropriate CN grossly intact Motor exam: Upper Extremities Deltoid Bicep Tricep Grip  Right 5/5 5/5 5/5 5/5  Left 5/5 5/5 5/5 5/5   Lower Extremity IP Quad PF DF EHL  Right 5/5 5/5 5/5 5/5 5/5  Left 5/5 5/5 5/5 5/5 5/5   Sensation grossly intact to LT  IMPRESSION: - 61 y.o. female 1 yr s/p clipping of ruptured right PICA aneurysm, doing well  PLAN: - Will proceed with f/u angiogram  I have previously reviewed the indications, risks, benefits, and alternatives to the angiogram with the patient and her family. All questions were answered.

## 2017-02-16 NOTE — Discharge Instructions (Signed)
Cerebral Angiogram, Care After °Refer to this sheet in the next few weeks. These instructions provide you with information on caring for yourself after your procedure. Your health care provider may also give you more specific instructions. Your treatment has been planned according to current medical practices, but problems sometimes occur. Call your health care provider if you have any problems or questions after your procedure. °What can I expect after the procedure? °After your procedure, it is typical to have the following: °· Bruising at the catheter insertion site that usually fades within 1-2 weeks. °· Blood collecting in the tissue (hematoma) that may be painful to the touch. It should usually decrease in size and tenderness within 1-2 weeks. °· A mild headache. ° °Follow these instructions at home: °· Take medicines only as directed by your health care provider. °· You may shower 24-48 hours after the procedure or as directed by your health care provider. Remove the bandage (dressing) and gently wash the site with plain soap and water. Pat the area dry with a clean towel. Do not rub the site, because this may cause bleeding. °· Do not take baths, swim, or use a hot tub until your health care provider approves. °· Check your insertion site every day for redness, swelling, or drainage. °· Do not apply powder or lotion to the site. °· Do not lift over 10 lb (4.5 kg) for 5 days after your procedure or as directed by your health care provider. °· Ask your health care provider when it is okay to: °? Return to work or school. °? Resume usual physical activities or sports. °? Resume sexual activity. °· Do not drive home if you are discharged the same day as the procedure. Have someone else drive you. °· You may drive 24 hours after the procedure unless otherwise instructed by your health care provider. °· Do not operate machinery or power tools for 24 hours after the procedure or as directed by your health care  provider. °· If your procedure was done as an outpatient procedure, which means that you went home the same day as your procedure, a responsible adult should be with you for the first 24 hours after you arrive home. °· Keep all follow-up visits as directed by your health care provider. This is important. °Contact a health care provider if: °· You have a fever. °· You have chills. °· You have increased bleeding from the catheter insertion site. Hold pressure on the site. °Get help right away if: °· You have vision changes or loss of vision. °· You have numbness or weakness on one side of your body. °· You have difficulty talking, or you have slurred speech or cannot speak (aphasia). °· You feel confused or have difficulty remembering. °· You have unusual pain at the catheter insertion site. °· You have redness, warmth, or swelling at the catheter insertion site. °· You have drainage (other than a small amount of blood on the dressing) from the catheter insertion site. °· The catheter insertion site is bleeding, and the bleeding does not stop after 30 minutes of holding steady pressure on the site. °These symptoms may represent a serious problem that is an emergency. Do not wait to see if the symptoms will go away. Get medical help right away. Call your local emergency services (911 in [place] Do not drive yourself to the hospital. °This information is not intended to replace advice given to you by your health care provider. Make sure you discuss any questions   you have with your health care provider. °Document Released: 11/20/2013 Document Revised: 12/12/2015 Document Reviewed: 07/19/2013 °Elsevier Interactive Patient Education © 2017 [employer] °Moderate Conscious Sedation, Adult, Care After °These instructions provide you with information about caring for yourself after your procedure. Your health care provider may also give you more specific instructions. Your treatment has been planned according to current  medical practices, but problems sometimes occur. Call your health care provider if you have any problems or questions after your procedure. °What can I expect after the procedure? °After your procedure, it is common: °· To feel sleepy for several hours. °· To feel clumsy and have poor balance for several hours. °· To have poor judgment for several hours. °· To vomit if you eat too soon. ° °Follow these instructions at home: °For at least 24 hours after the procedure: ° °· Do not: °? Participate in activities where you could fall or become injured. °? Drive. °? Use heavy machinery. °? Drink alcohol. °? Take sleeping pills or medicines that cause drowsiness. °? Make important decisions or sign legal documents. °? Take care of children on your own. °· Rest. °Eating and drinking °· Follow the diet recommended by your health care provider. °· If you vomit: °? Drink water, juice, or soup when you can drink without vomiting. °? Make sure you have little or no nausea before eating solid foods. °General instructions °· Have a responsible adult stay with you until you are awake and alert. °· Take over-the-counter and prescription medicines only as told by your health care provider. °· If you smoke, do not smoke without supervision. °· Keep all follow-up visits as told by your health care provider. This is important. °Contact a health care provider if: °· You keep feeling nauseous or you keep vomiting. °· You feel light-headed. °· You develop a rash. °· You have a fever. °Get help right away if: °· You have trouble breathing. °This information is not intended to replace advice given to you by your health care provider. Make sure you discuss any questions you have with your health care provider. °Document Released: 04/26/2013 Document Revised: 12/09/2015 Document Reviewed: 10/26/2015 °Elsevier Interactive Patient Education © 2018 Else[employer] Inc. ° °

## 2017-02-22 ENCOUNTER — Ambulatory Visit (INDEPENDENT_AMBULATORY_CARE_PROVIDER_SITE_OTHER): Payer: BLUE CROSS/BLUE SHIELD | Admitting: Orthopaedic Surgery

## 2017-02-23 ENCOUNTER — Other Ambulatory Visit (INDEPENDENT_AMBULATORY_CARE_PROVIDER_SITE_OTHER): Payer: Self-pay | Admitting: Orthopaedic Surgery

## 2017-02-23 DIAGNOSIS — M25512 Pain in left shoulder: Secondary | ICD-10-CM

## 2017-03-05 ENCOUNTER — Encounter: Payer: Self-pay | Admitting: Endocrinology

## 2017-03-05 ENCOUNTER — Ambulatory Visit (INDEPENDENT_AMBULATORY_CARE_PROVIDER_SITE_OTHER): Payer: BLUE CROSS/BLUE SHIELD | Admitting: Endocrinology

## 2017-03-05 VITALS — BP 148/88 | HR 84 | Temp 98.6°F | Wt 231.6 lb

## 2017-03-05 DIAGNOSIS — R059 Cough, unspecified: Secondary | ICD-10-CM

## 2017-03-05 DIAGNOSIS — R05 Cough: Secondary | ICD-10-CM

## 2017-03-05 MED ORDER — CEFUROXIME AXETIL 250 MG PO TABS: 250.0000 mg | ORAL_TABLET | Freq: Two times a day (BID) | ORAL | 0 refills | Status: AC

## 2017-03-05 MED ORDER — BENZONATATE 100 MG PO CAPS: 100.0000 mg | ORAL_CAPSULE | Freq: Two times a day (BID) | ORAL | 0 refills | Status: DC | PRN

## 2017-03-05 NOTE — Patient Instructions (Addendum)
I have sent 2 prescriptions to your pharmacy: cough pills, and an antibiotic. I hope you feel better soon.  If you don't feel better by week after next, please call back.  Please call sooner if you get worse. Please come back for a regular physical appointment in 1-2 months.

## 2017-03-05 NOTE — Progress Notes (Signed)
Subjective:    Patient ID: Kelly Wilson, female    DOB: 31-Aug-1955, 61 y.o.   MRN: [id number]  HPI Pt states few days of pain at the throat at the throat, and assoc dry cough.   Past Medical History:  Diagnosis Date  . GERD 08/09/2008  . 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 Hyperlipidemia   . HYPERLIPIDEMIA 05/17/2007  . Hypertension   . HYPERTENSION 05/17/2007  . Left ear hearing loss    Mild  . Muscle twitching   . OSTEOARTHRITIS 05/17/2007  . SPINAL STENOSIS 05/17/2007  . Stroke ([place])   . TRANSIENT ISCHEMIC ATTACK, HX OF 05/17/2007    Past Surgical History:  Procedure Laterality Date  . ABDOMINAL HYSTERECTOMY  1995  . ANKLE SURGERY  2005  . CESAREAN SECTION  1993  . CRANIOTOMY Right 12/17/2015   Procedure: Suboccipital CRANIectomy INTRACRANIAL ANEURYSM FOR VERTEBRAL/BASILAR;  Surgeon: Consuella Lose, MD;  Location: [facility];  Service: Neurosurgery;  Laterality: Right;  suboccipital  . EXTERNAL EAR SURGERY     left side x's 2  . IR ANGIO INTRA EXTRACRAN SEL INTERNAL CAROTID BILAT MOD SED  02/16/2017  . IR ANGIO VERTEBRAL SEL VERTEBRAL BILAT MOD SED  02/16/2017  . KNEE SURGERY  02/2011  . ORIF ELBOW FRACTURE  02/2011  . SKIN GRAFT FULL THICKNESS ARM    . SKIN GRAFT FULL THICKNESS LEG    . STAPEDECTOMY    . throidectomy    . THYROIDECTOMY, PARTIAL Right 1980  . TUBAL LIGATION      Social History   Social History  . Marital status: Married    Spouse name: N/A  . Number of children: N/A  . Years of education: N/A   Occupational History  . realtor    Social History Main Topics  . Smoking status: Never Smoker  . Smokeless tobacco: Never Used     Comment: Married, she is a Cabin crew  . Alcohol use No  . Drug use: No  . Sexual activity: Not on file   Other Topics Concern  . Not on file   Social History Narrative   Married, Cabin crew, 2 daughters   Caffeine use: tea daily    Current Outpatient Prescriptions on File Prior to Visit  Medication Sig  Dispense Refill  . losartan-hydrochlorothiazide (HYZAAR) 50-12.5 MG tablet take 1 tablet by mouth once daily 30 tablet 3  . Magnesium 500 MG TABS Take 500 mg by mouth daily.    . Omega-3 Fatty Acids (FISH OIL PO) Take 720 mg by mouth daily.    Marland Kitchen omeprazole (PRILOSEC) 40 MG capsule Take 1 capsule (40 mg total) by mouth daily. 30 capsule 1  . ondansetron (ZOFRAN-ODT) 4 MG disintegrating tablet DISSOLVE 1 TABLET ON THE TONGUE EVERY 8 HOURS AS NEEDED FOR NAUSEA OR VOMITING 20 tablet 2  . acetaminophen (TYLENOL) 500 MG tablet Take 1,500 mg by mouth 2 (two) times daily as needed (for pain/shoulder pain.).     No current facility-administered medications on file prior to visit.     Allergies  Allergen Reactions  . Morphine Hives  . Rosuvastatin Other (See Comments)    myalgia    Family History  Problem Relation Age of Onset  . Coronary artery disease Mother   . Diabetes Mother   . Coronary artery disease Father   . Diabetes Sister        2 sister with DM  . Cancer Other        uncle had rectal cancer  .  Hypertension Other   . Hyperlipidemia Other     BP (!) 148/88   Pulse 84   Temp 98.6 F (37 C)   Wt 231 lb 9.6 oz (105.1 kg)   SpO2 97%   BMI 37.38 kg/m    Review of Systems Fever is resolved.  Denies earache.     Objective:   Physical Exam VITAL SIGNS:  See vs page.  GENERAL: no distress.  head: no deformity.   eyes: no periorbital swelling, no proptosis.   external nose and ears are normal  mouth: no lesion seen.   Both eac's and tm's are normal.  LUNGS:  Clear to auscultation.      Assessment & Plan:  URI: new  Patient Instructions  I have sent 2 prescriptions to your pharmacy: cough pills, and an antibiotic. I hope you feel better soon.  If you don't feel better by week after next, please call back.  Please call sooner if you get worse. Please come back for a regular physical appointment in 1-2 months.

## 2017-03-08 ENCOUNTER — Other Ambulatory Visit: Payer: Self-pay | Admitting: Endocrinology

## 2017-04-07 ENCOUNTER — Other Ambulatory Visit: Payer: Self-pay | Admitting: Endocrinology

## 2017-05-09 ENCOUNTER — Other Ambulatory Visit: Payer: Self-pay | Admitting: Endocrinology

## 2017-06-08 ENCOUNTER — Other Ambulatory Visit: Payer: Self-pay | Admitting: Internal Medicine

## 2017-06-09 NOTE — Telephone Encounter (Signed)
This is an [facility] patient. 

## 2017-07-08 ENCOUNTER — Other Ambulatory Visit: Payer: Self-pay | Admitting: Endocrinology

## 2017-07-20 HISTORY — PX: CHOLECYSTECTOMY, LAPAROSCOPIC: SHX56

## 2017-08-13 ENCOUNTER — Other Ambulatory Visit: Payer: Self-pay | Admitting: Endocrinology

## 2017-09-12 ENCOUNTER — Other Ambulatory Visit: Payer: Self-pay | Admitting: Endocrinology

## 2017-10-14 IMAGING — CR DG ABD PORTABLE 1V
1 series · 1 of 1 positions shown · non-contrast
Comparison: None.

CLINICAL DATA: NG tube placement.

EXAM:
PORTABLE ABDOMEN - 1 VIEW

[AP]
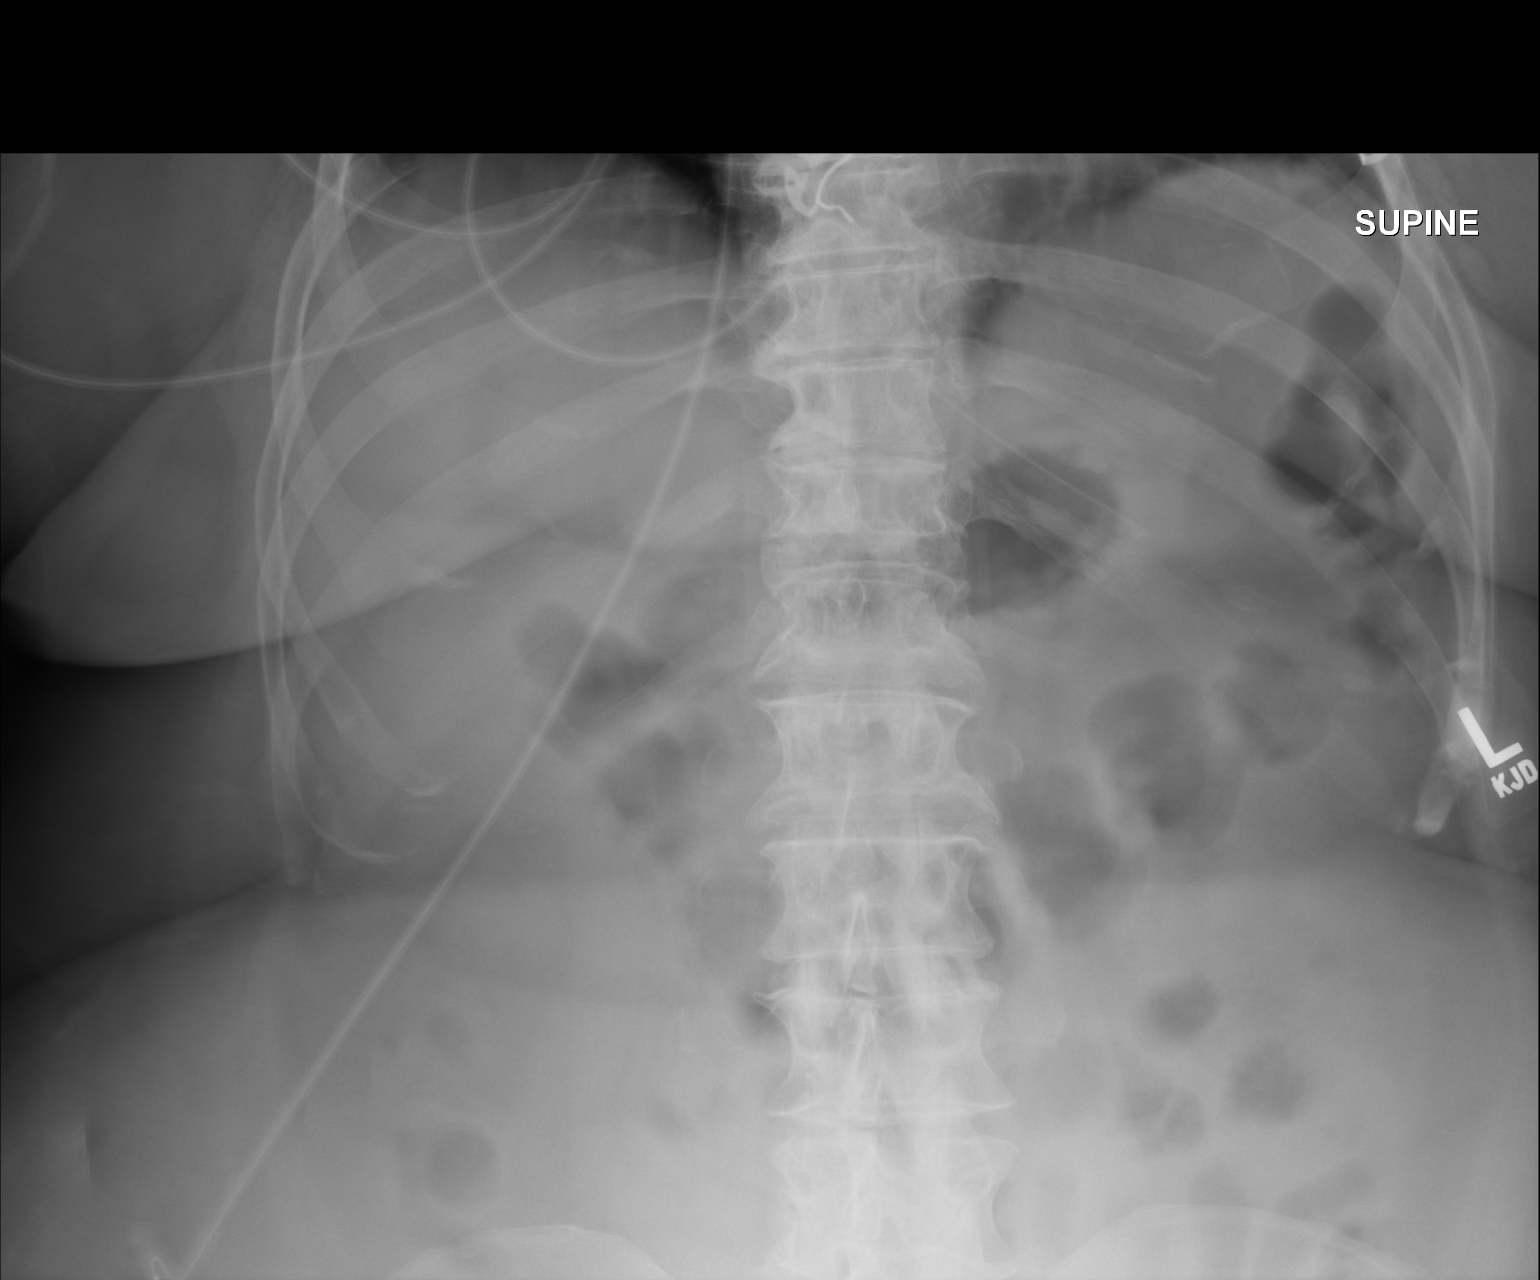

[1 of 1 positions shown; findings below may reference images not displayed]

FINDINGS: There is evidence of patient's NG tube with tip over the stomach in
the left upper quadrant as the non radiopaque side port is in the
region of the gastroesophageal junction. Bowel gas pattern is
nonobstructive. There are mild degenerative changes of the spine.
IMPRESSION: No acute findings.

Nasogastric tube with tip over the stomach in the left upper
quadrant and side-port in the region of the gastroesophageal
junction.

## 2017-10-15 ENCOUNTER — Other Ambulatory Visit: Payer: Self-pay | Admitting: Endocrinology

## 2017-10-19 ENCOUNTER — Ambulatory Visit (INDEPENDENT_AMBULATORY_CARE_PROVIDER_SITE_OTHER): Payer: BLUE CROSS/BLUE SHIELD

## 2017-10-19 ENCOUNTER — Encounter (INDEPENDENT_AMBULATORY_CARE_PROVIDER_SITE_OTHER): Payer: Self-pay | Admitting: Orthopaedic Surgery

## 2017-10-19 ENCOUNTER — Ambulatory Visit (INDEPENDENT_AMBULATORY_CARE_PROVIDER_SITE_OTHER): Payer: BLUE CROSS/BLUE SHIELD | Admitting: Orthopaedic Surgery

## 2017-10-19 DIAGNOSIS — M654 Radial styloid tenosynovitis [de Quervain]: Secondary | ICD-10-CM

## 2017-10-19 DIAGNOSIS — M7522 Bicipital tendinitis, left shoulder: Secondary | ICD-10-CM

## 2017-10-19 IMAGING — CR DG CHEST 1V PORT
1 series · 1 of 1 positions shown · non-contrast
Comparison: Chest x-rays dated 12/16/2015 in [REDACTED].

CLINICAL DATA: Edema.

EXAM:
PORTABLE CHEST 1 VIEW

[AP]
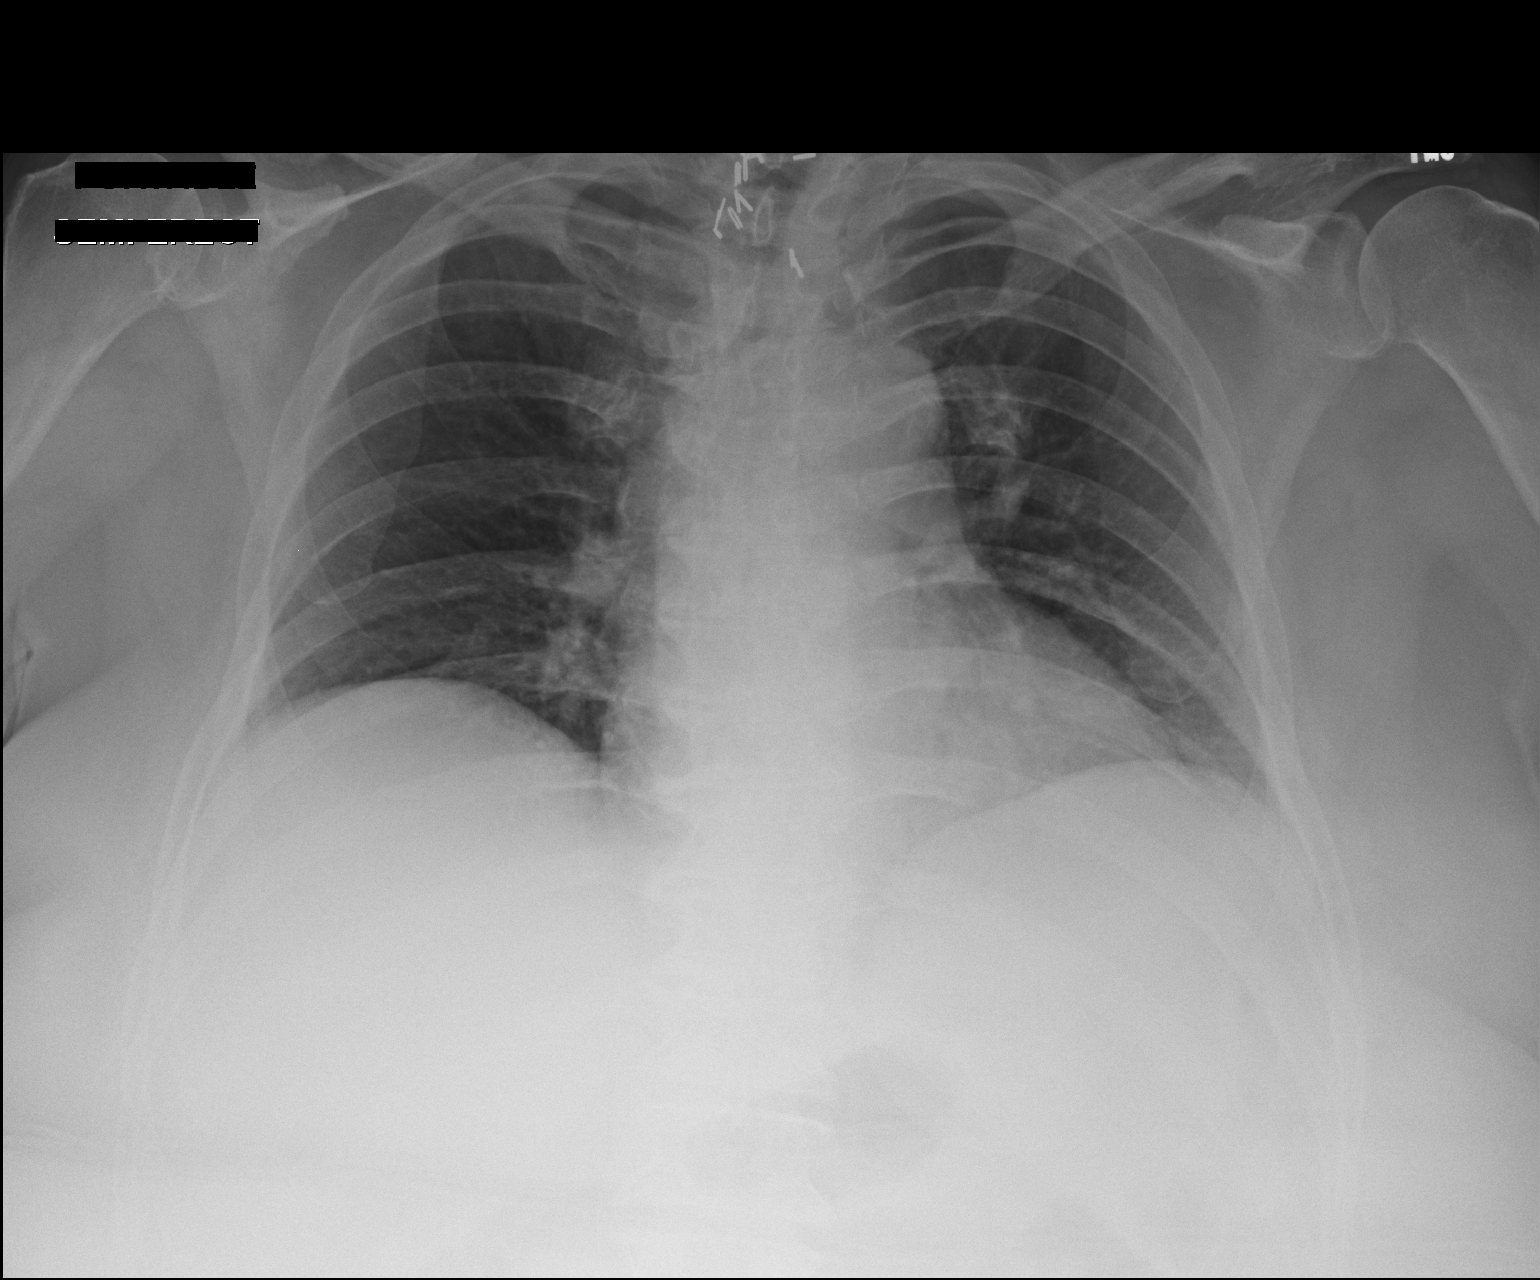

[1 of 1 positions shown; findings below may reference images not displayed]

FINDINGS: Study is slightly hypoinspiratory with crowding of the perihilar
bronchovascular markings. Given the low lung volumes, lungs appear
clear. No evidence of active CHF. No evidence of pneumonia. No
pleural effusion or pneumothorax seen. Cardiomediastinal silhouette
remains within normal limits in size and configuration.

Surgical changes again noted over the lower midline neck. Osseous
and soft tissue structures about the chest are otherwise
unremarkable.
IMPRESSION: Low lung volumes. No evidence of acute cardiopulmonary abnormality.
No evidence of CHF. No evidence of pneumonia.

## 2017-10-19 MED ORDER — METHYLPREDNISOLONE ACETATE 40 MG/ML IJ SUSP
13.3300 mg | INTRAMUSCULAR | Status: AC | PRN
  Administered 2017-10-19: 13.33 mg

## 2017-10-19 MED ORDER — BUPIVACAINE HCL 0.5 % IJ SOLN
0.3300 mL | INTRAMUSCULAR | Status: AC | PRN
  Administered 2017-10-19: .33 mL

## 2017-10-19 MED ORDER — LIDOCAINE HCL 1 % IJ SOLN
0.3000 mL | INTRAMUSCULAR | Status: AC | PRN
  Administered 2017-10-19: .3 mL

## 2017-10-19 NOTE — Progress Notes (Signed)
Office Visit Note   Patient: Kelly Wilson           Date of Birth: 1955-12-15           MRN: [id number] Visit Date: 10/19/2017              Requested by: [name], MD [address] [place], [place] [address] PCP: [name], MD   Assessment & Plan: Visit Diagnoses:  1. Biceps tendinitis on left   2. Tendinitis, de Quervain's     Plan: Impression is right de Quervain's tenosynovitis and left distal biceps tendinosis.  Injection was performed for the de Quervain's.  Continue using the wrist brace for 4 weeks and then wean as tolerated.  For the distal biceps tendinosis I recommend no lifting and rest and NSAIDs as needed.  Questions encouraged and answered.  Follow-up as needed.  Follow-Up Instructions: Return if symptoms worsen or fail to improve.   Orders:  Orders Placed This Encounter  Procedures  . XR Shoulder Left  . XR Wrist Complete Right   No orders of the defined types were placed in this encounter.     Procedures: Hand/UE Inj: R extensor compartment 1 for de Quervain's tenosynovitis on 10/19/2017 2:31 PM Indications: pain Details: 25 G needle Medications: 0.3 mL lidocaine 1 %; 0.33 mL bupivacaine 0.5 %; 13.33 mg methylPREDNISolone acetate 40 MG/ML Outcome: tolerated well, no immediate complications Patient was prepped and draped in the usual sterile fashion.       Clinical Data: No additional findings.   Subjective: Chief Complaint  Patient presents with  . Right Wrist - Pain  . Left Upper Arm - Pain    Patient is 62 year old female comes in with 2 separate issues.  First 1 is right radial sided wrist pain is worse with activity and use of the right hand.  She does a lot of typing.  And sore up into the dorsal part of her forearm.  Denies any numbness and tingling.  Second issue is left biceps discomfort and pain.  This is worse with lifting and picking up her dog.  Denies any numbness and tingling.  Denies any radicular  symptoms.   Review of Systems  Constitutional: Negative.   HENT: Negative.   Eyes: Negative.   Respiratory: Negative.   Cardiovascular: Negative.   Endocrine: Negative.   Musculoskeletal: Negative.   Neurological: Negative.   Hematological: Negative.   Psychiatric/Behavioral: Negative.   All other systems reviewed and are negative.    Objective: Vital Signs: There were no vitals taken for this visit.  Physical Exam  Constitutional: She is oriented to person, place, and time. She appears well-developed and well-nourished.  Pulmonary/Chest: Effort normal.  Neurological: She is alert and oriented to person, place, and time.  Skin: Skin is warm. Capillary refill takes less than 2 seconds.  Psychiatric: She has a normal mood and affect. Her behavior is normal. Judgment and thought content normal.  Nursing note and vitals reviewed.   Ortho Exam Right wrist exam shows negative grind test.  Positive Finkelstein's.  Negative crepitus in the dorsal forearm. Left shoulder exam is benign.  She has tenderness in the distal myotendinous junction of the biceps muscle.  She has decent strength with some limitation secondary to pain. Specialty Comments:  No specialty comments available.  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  No acute abnormalities.  Xr Shoulder Left  Result Date: 10/19/2017  No acute or structural abnormalities.  Enlarged distal clavicle with AC joint arthropathy    PMFS History: Patient Active Problem List   Diagnosis Date Noted  . Nausea with vomiting 04/20/2016  . Leukopenia   . Bradycardia   . Acute lower UTI   . Agitation   . Lethargic   . Lethargy   . Bilateral headaches   . Other vascular headache   . Hypokalemia   . Heat rash   . Poor appetite   . Hyponatremia   . Cephalalgia   . Obstructive hydrocephalus 12/17/2015  . 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 Headache 12/17/2015  . IVH (intraventricular hemorrhage) ([place]) 12/16/2015  . Chest pain 05/10/2015  . Precordial chest pain 05/10/2015  . Chest wall pain 05/10/2015  . Pain in the chest   . Wellness examination 01/24/2015  . Myalgia 11/14/2014  . Muscle cramps 11/14/2014  . Pinched nerve 08/28/2014  . Routine general medical examination at a health care facility 08/12/2013  . Cough 04/21/2012  . Lymphadenitis, acute 10/23/2011  . GERD 08/09/2008  . OTHER DYSPHAGIA 06/18/2008  . 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 NECK DISORDER 05/22/2008  . HEADACHE 08/16/2007  . OTITIS MEDIA, ACUTE, LEFT 08/11/2007  . OTHER ABNORMAL BLOOD CHEMISTRY 07/25/2007  . HYPERLIPIDEMIA 05/17/2007  . Essential hypertension 05/17/2007  . OSTEOARTHRITIS 05/17/2007  . SPINAL STENOSIS 05/17/2007  . History of cardiovascular disorder 05/17/2007   Past Medical History:  Diagnosis Date  . GERD 08/09/2008  . 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 Hyperlipidemia   . HYPERLIPIDEMIA 05/17/2007  . Hypertension   . HYPERTENSION 05/17/2007  . Left ear hearing loss    Mild  . Muscle twitching   . OSTEOARTHRITIS 05/17/2007  . SPINAL STENOSIS 05/17/2007  . Stroke ([place])   . TRANSIENT ISCHEMIC ATTACK, HX OF 05/17/2007    Family History  Problem Relation Age of Onset  . Coronary artery disease Mother   . Diabetes Mother   . Coronary artery disease Father   . Diabetes Sister        2 sister with DM  . Cancer Other        uncle had rectal cancer  . Hypertension Other   . Hyperlipidemia Other     Past Surgical History:  Procedure Laterality Date  . ABDOMINAL HYSTERECTOMY  1995  . ANKLE SURGERY  2005  . CESAREAN SECTION  1993  . CRANIOTOMY Right 12/17/2015   Procedure: Suboccipital CRANIectomy INTRACRANIAL ANEURYSM FOR VERTEBRAL/BASILAR;  Surgeon: Consuella Lose, MD;  Location: [facility];  Service: Neurosurgery;  Laterality: Right;  suboccipital  . EXTERNAL EAR SURGERY     left side x's 2    . IR ANGIO INTRA EXTRACRAN SEL INTERNAL CAROTID BILAT MOD SED  02/16/2017  . IR ANGIO VERTEBRAL SEL VERTEBRAL BILAT MOD SED  02/16/2017  . KNEE SURGERY  02/2011  . ORIF ELBOW FRACTURE  02/2011  . SKIN GRAFT FULL THICKNESS ARM    . SKIN GRAFT FULL THICKNESS LEG    . STAPEDECTOMY    . throidectomy    . THYROIDECTOMY, PARTIAL Right 1980  . TUBAL LIGATION     Social History   Occupational History  . Occupation: realtor  Tobacco Use  . Smoking status: Never Smoker  . Smokeless tobacco: Never Used  . Tobacco comment: Married, she is a Licensed conveyancer and Sexual Activity  . Alcohol use: No  . Drug use: No  . Sexual activity: Not on file

## 2017-11-11 ENCOUNTER — Other Ambulatory Visit: Payer: Self-pay | Admitting: Endocrinology

## 2017-12-16 ENCOUNTER — Other Ambulatory Visit: Payer: Self-pay | Admitting: Endocrinology

## 2018-03-14 ENCOUNTER — Other Ambulatory Visit: Payer: Self-pay | Admitting: Physician Assistant

## 2018-03-14 DIAGNOSIS — M5441 Lumbago with sciatica, right side: Secondary | ICD-10-CM

## 2018-03-28 ENCOUNTER — Ambulatory Visit
Admission: RE | Admit: 2018-03-28 | Discharge: 2018-03-28 | Disposition: A | Discharge status: Home / Self Care | Payer: BLUE CROSS/BLUE SHIELD | Source: Ambulatory Visit | Attending: Physician Assistant | Admitting: Physician Assistant

## 2018-03-28 DIAGNOSIS — M5441 Lumbago with sciatica, right side: Secondary | ICD-10-CM

## 2018-03-28 MED ORDER — MEPERIDINE HCL 100 MG/ML IJ SOLN
75.0000 mg | Freq: Once | INTRAMUSCULAR | Status: AC
  Administered 2018-03-28: 75 mg via INTRAMUSCULAR

## 2018-03-28 MED ORDER — IOPAMIDOL (ISOVUE-M 200) INJECTION 41%
20.0000 mL | Freq: Once | INTRAMUSCULAR | Status: AC
  Administered 2018-03-28: 20 mL via INTRATHECAL

## 2018-03-28 MED ORDER — DIAZEPAM 5 MG PO TABS
10.0000 mg | ORAL_TABLET | Freq: Once | ORAL | Status: AC
  Administered 2018-03-28: 10 mg via ORAL

## 2018-03-28 MED ORDER — ONDANSETRON HCL 4 MG/2ML IJ SOLN
4.0000 mg | Freq: Once | INTRAMUSCULAR | Status: AC
  Administered 2018-03-28: 4 mg via INTRAMUSCULAR

## 2018-03-28 NOTE — Discharge Instructions (Signed)

## 2018-04-07 ENCOUNTER — Other Ambulatory Visit: Payer: Self-pay | Admitting: Neurosurgery

## 2018-04-07 DIAGNOSIS — G8929 Other chronic pain: Secondary | ICD-10-CM

## 2018-04-07 DIAGNOSIS — M5441 Lumbago with sciatica, right side: Principal | ICD-10-CM

## 2018-04-22 ENCOUNTER — Other Ambulatory Visit: Payer: Self-pay | Admitting: Endocrinology

## 2018-04-27 ENCOUNTER — Encounter: Payer: Self-pay | Admitting: Endocrinology

## 2018-04-27 ENCOUNTER — Ambulatory Visit: Payer: BLUE CROSS/BLUE SHIELD | Admitting: Endocrinology

## 2018-04-27 VITALS — BP 132/80 | HR 90 | Ht 66.0 in | Wt 242.2 lb

## 2018-04-27 DIAGNOSIS — Z0000 Encounter for general adult medical examination without abnormal findings: Secondary | ICD-10-CM | POA: Diagnosis not present

## 2018-04-27 LAB — HEPATIC FUNCTION PANEL
ALK PHOS: 67 U/L (ref 39–117)
ALT: 14 U/L (ref 0–35)
AST: 13 U/L (ref 0–37)
Albumin: 4.4 g/dL (ref 3.5–5.2)
BILIRUBIN DIRECT: 0.1 mg/dL (ref 0.0–0.3)
BILIRUBIN TOTAL: 0.5 mg/dL (ref 0.2–1.2)
Total Protein: 7 g/dL (ref 6.0–8.3)

## 2018-04-27 LAB — CBC WITH DIFFERENTIAL/PLATELET
BASOS ABS: 0 10*3/uL (ref 0.0–0.1)
Basophils Relative: 0.4 % (ref 0.0–3.0)
EOS ABS: 0.1 10*3/uL (ref 0.0–0.7)
Eosinophils Relative: 0.9 % (ref 0.0–5.0)
HEMATOCRIT: 46.8 % — AB (ref 36.0–46.0)
Hemoglobin: 15.8 g/dL — ABNORMAL HIGH (ref 12.0–15.0)
LYMPHS ABS: 2.2 10*3/uL (ref 0.7–4.0)
LYMPHS PCT: 25.8 % (ref 12.0–46.0)
MCHC: 33.8 g/dL (ref 30.0–36.0)
MCV: 88 fl (ref 78.0–100.0)
MONO ABS: 0.4 10*3/uL (ref 0.1–1.0)
Monocytes Relative: 4.7 % (ref 3.0–12.0)
NEUTROS ABS: 5.8 10*3/uL (ref 1.4–7.7)
NEUTROS PCT: 68.2 % (ref 43.0–77.0)
Platelets: 403 10*3/uL — ABNORMAL HIGH (ref 150.0–400.0)
RBC: 5.32 Mil/uL — ABNORMAL HIGH (ref 3.87–5.11)
RDW: 15.1 % (ref 11.5–15.5)
WBC: 8.5 10*3/uL (ref 4.0–10.5)

## 2018-04-27 LAB — BASIC METABOLIC PANEL
BUN: 19 mg/dL (ref 6–23)
CO2: 27 meq/L (ref 19–32)
Calcium: 9.6 mg/dL (ref 8.4–10.5)
Chloride: 100 mEq/L (ref 96–112)
Creatinine, Ser: 0.99 mg/dL (ref 0.40–1.20)
GFR: 59.89 mL/min — ABNORMAL LOW (ref >60.00)
Glucose, Bld: 124 mg/dL — ABNORMAL HIGH (ref 70–99)
POTASSIUM: 3.4 meq/L — AB (ref 3.5–5.1)
SODIUM: 136 meq/L (ref 135–145)

## 2018-04-27 LAB — LIPID PANEL
CHOL/HDL RATIO: 3
Cholesterol: 284 mg/dL — ABNORMAL HIGH (ref 0–200)
HDL: 83.8 mg/dL (ref >39.00)
LDL CALC: 166 mg/dL — AB (ref 0–99)
NonHDL: 200.36
TRIGLYCERIDES: 171 mg/dL — AB (ref 0.0–149.0)
VLDL: 34.2 mg/dL (ref 0.0–40.0)

## 2018-04-27 LAB — TSH: TSH: 2.71 u[IU]/mL (ref 0.35–4.50)

## 2018-04-27 MED ORDER — LOSARTAN POTASSIUM-HCTZ 50-12.5 MG PO TABS: 1.0000 | ORAL_TABLET | Freq: Every day | ORAL | 3 refills | Status: DC

## 2018-04-27 NOTE — Patient Instructions (Signed)
Please continue the same medication.  blood tests are requested for you today.  We'll let you know about the results.   Best wishes with your new primary care provider.

## 2018-04-27 NOTE — Progress Notes (Signed)
Subjective:    Patient ID: Kelly Wilson, female    DOB: 07/13/1956, 62 y.o.   MRN: [id number]  HPI Pt returns for f/u of HTN: she recently had a myelogram.  BP then was 220/.  Spinal stenosis was found.  pt states she feels well in general.   Denies chest pain and fever.   Past Medical History:  Diagnosis Date  . GERD 08/09/2008  . 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 Hyperlipidemia   . HYPERLIPIDEMIA 05/17/2007  . Hypertension   . HYPERTENSION 05/17/2007  . Left ear hearing loss    Mild  . Muscle twitching   . OSTEOARTHRITIS 05/17/2007  . SPINAL STENOSIS 05/17/2007  . Stroke ([place])   . TRANSIENT ISCHEMIC ATTACK, HX OF 05/17/2007    Past Surgical History:  Procedure Laterality Date  . ABDOMINAL HYSTERECTOMY  1995  . ANKLE SURGERY  2005  . CESAREAN SECTION  1993  . CRANIOTOMY Right 12/17/2015   Procedure: Suboccipital CRANIectomy INTRACRANIAL ANEURYSM FOR VERTEBRAL/BASILAR;  Surgeon: Consuella Lose, MD;  Location: [facility];  Service: Neurosurgery;  Laterality: Right;  suboccipital  . EXTERNAL EAR SURGERY     left side x's 2  . IR ANGIO INTRA EXTRACRAN SEL INTERNAL CAROTID BILAT MOD SED  02/16/2017  . IR ANGIO VERTEBRAL SEL VERTEBRAL BILAT MOD SED  02/16/2017  . KNEE SURGERY  02/2011  . ORIF ELBOW FRACTURE  02/2011  . SKIN GRAFT FULL THICKNESS ARM    . SKIN GRAFT FULL THICKNESS LEG    . STAPEDECTOMY    . throidectomy    . THYROIDECTOMY, PARTIAL Right 1980  . TUBAL LIGATION      Social History   Socioeconomic History  . Marital status: Married    Spouse name: Not on file  . Number of children: Not on file  . Years of education: Not on file  . Highest education level: Not on file  Occupational History  . Occupation: Cabin crew  Social Needs  . Financial resource strain: Not on file  . Food insecurity:    Worry: Not on file    Inability: Not on file  . Transportation needs:    Medical: Not on file    Non-medical: Not on file  Tobacco Use  . Smoking status:  Never Smoker  . Smokeless tobacco: Never Used  . Tobacco comment: Married, she is a Licensed conveyancer and Sexual Activity  . Alcohol use: No  . Drug use: No  . Sexual activity: Not on file  Lifestyle  . Physical activity:    Days per week: Not on file    Minutes per session: Not on file  . Stress: Not on file  Relationships  . Social connections:    Talks on phone: Not on file    Gets together: Not on file    Attends religious service: Not on file    Active member of club or organization: Not on file    Attends meetings of clubs or organizations: Not on file    Relationship status: Not on file  . Intimate partner violence:    Fear of current or ex partner: Not on file    Emotionally abused: Not on file    Physically abused: Not on file    Forced sexual activity: Not on file  Other Topics Concern  . Not on file  Social History Narrative   Married, Cabin crew, 2 daughters   Caffeine use: tea daily    Current Outpatient Medications on File Prior to  Visit  Medication Sig Dispense Refill  . acetaminophen (TYLENOL) 500 MG tablet Take 1,500 mg by mouth 2 (two) times daily as needed (for pain/shoulder pain.).    Marland Kitchen aspirin 325 MG tablet Take 325 mg by mouth daily.    . Magnesium 500 MG TABS Take 500 mg by mouth daily.    . Omega-3 Fatty Acids (FISH OIL PO) Take 720 mg by mouth daily.    Marland Kitchen omeprazole (PRILOSEC) 40 MG capsule Take 1 capsule (40 mg total) by mouth daily. 30 capsule 1  . ondansetron (ZOFRAN-ODT) 4 MG disintegrating tablet DISSOLVE 1 TABLET ON THE TONGUE EVERY 8 HOURS AS NEEDED FOR NAUSEA OR VOMITING 20 tablet 2  . potassium chloride (K-DUR) 10 MEQ tablet Take 10 mEq by mouth daily.     No current facility-administered medications on file prior to visit.     Allergies  Allergen Reactions  . Morphine Hives  . Rosuvastatin Other (See Comments)    myalgia    Family History  Problem Relation Age of Onset  . Coronary artery disease Mother   . Diabetes Mother   .  Coronary artery disease Father   . Diabetes Sister        2 sister with DM  . Cancer Other        uncle had rectal cancer  . Hypertension Other   . Hyperlipidemia Other     BP 132/80 (BP Location: Left Arm, Patient Position: Sitting)   Pulse 90   Ht 5\' 6"  (1.676 m)   Wt 242 lb 3.2 oz (109.9 kg)   SpO2 97%   BMI 39.09 kg/m   Review of Systems Denies sob.      Objective:   Physical Exam VITAL SIGNS:  See vs page.  GENERAL: no distress.  LUNGS:  Clear to auscultation.  HEART:  Regular rate and rhythm without murmurs noted. Normal S1,S2.    ecg is declined.       Assessment & Plan:  HTN: with situational component: well-controlled now Renal insuff: recheck today. Hypokalemia: recheck today.  Patient Instructions  Please continue the same medication.  blood tests are requested for you today.  We'll let you know about the results.   Best wishes with your new primary care provider.

## 2018-04-29 ENCOUNTER — Encounter: Payer: BLUE CROSS/BLUE SHIELD | Admitting: Endocrinology

## 2018-05-05 ENCOUNTER — Encounter (INDEPENDENT_AMBULATORY_CARE_PROVIDER_SITE_OTHER): Payer: Self-pay | Admitting: Orthopaedic Surgery

## 2018-05-05 ENCOUNTER — Ambulatory Visit (INDEPENDENT_AMBULATORY_CARE_PROVIDER_SITE_OTHER): Payer: BLUE CROSS/BLUE SHIELD | Admitting: Orthopaedic Surgery

## 2018-05-05 DIAGNOSIS — M654 Radial styloid tenosynovitis [de Quervain]: Secondary | ICD-10-CM

## 2018-05-05 MED ORDER — BUPIVACAINE HCL 0.5 % IJ SOLN
0.3300 mL | INTRAMUSCULAR | Status: AC | PRN
  Administered 2018-05-05: .33 mL

## 2018-05-05 MED ORDER — LIDOCAINE HCL 1 % IJ SOLN
0.3000 mL | INTRAMUSCULAR | Status: AC | PRN
  Administered 2018-05-05: .3 mL

## 2018-05-05 MED ORDER — METHYLPREDNISOLONE ACETATE 40 MG/ML IJ SUSP
13.3300 mg | INTRAMUSCULAR | Status: AC | PRN
  Administered 2018-05-05: 13.33 mg

## 2018-05-05 NOTE — Progress Notes (Signed)
Office Visit Note   Patient: Kelly Wilson           Date of Birth: 19-Aug-1955           MRN: [id number] Visit Date: 05/05/2018              Requested by: [name], MD [address] [place], [place] [address] PCP: [name], MD   Assessment & Plan: Visit Diagnoses:  1. De Quervain's tenosynovitis, right     Plan: Impression is recurrent right de Quervain's tenosynovitis with 5 months of relief from cortisone injection.  At this point patient would like to repeat the injection.  We briefly discussed surgery but she would like to avoid this if possible.  Patient tolerated the injection well.  Follow-up as needed.  I recommend using the thumb spica brace as needed.  Follow-Up Instructions: Return if symptoms worsen or fail to improve.   Orders:  No orders of the defined types were placed in this encounter.  No orders of the defined types were placed in this encounter.     Procedures: Hand/UE Inj: R extensor compartment 1 for de Quervain's tenosynovitis on 05/05/2018 8:13 AM Indications: pain Details: 25 G needle Medications: 0.3 mL lidocaine 1 %; 0.33 mL bupivacaine 0.5 %; 13.33 mg methylPREDNISolone acetate 40 MG/ML Outcome: tolerated well, no immediate complications Patient was prepped and draped in the usual sterile fashion.       Clinical Data: No additional findings.   Subjective: Chief Complaint  Patient presents with  . Right Wrist - Pain, Follow-up    Kelly Wilson follows up today for recurrent right de Quervain's tenosynovitis.  We saw her back in April and gave her an injection which helped for about 5 months.  Now the pain has returned and it is just as bad as before.  She has had a left de Quervain's release about 40 years ago.  She did well from the surgery.   Review of Systems   Objective: Vital Signs: There were no vitals taken for this visit.  Physical Exam  Ortho Exam Right wrist exam is stable.  Positive Finkelstein's.   Pain with palpation over the first dorsal wrist compartment. Specialty Comments:  No specialty comments available.  Imaging: No results found.   PMFS History: Patient Active Problem List   Diagnosis Date Noted  . Nausea with vomiting 04/20/2016  . Leukopenia   . Bradycardia   . Acute lower UTI   . Agitation   . Lethargic   . Lethargy   . Bilateral headaches   . Other vascular headache   . Hypokalemia   . Heat rash   . Poor appetite   . Hyponatremia   . Cephalalgia   . Obstructive hydrocephalus ([place]) 12/17/2015  . 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 Headache 12/17/2015  . IVH (intraventricular hemorrhage) ([place]) 12/16/2015  . Chest pain 05/10/2015  . Precordial chest pain 05/10/2015  . Chest wall pain 05/10/2015  . Pain in the chest   . Wellness examination 01/24/2015  . Myalgia 11/14/2014  . Muscle cramps 11/14/2014  . Pinched nerve 08/28/2014  . Routine general medical examination at a health care facility 08/12/2013  . Cough 04/21/2012  . Lymphadenitis, acute 10/23/2011  . GERD 08/09/2008  . OTHER DYSPHAGIA 06/18/2008  . 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 NECK DISORDER 05/22/2008  . HEADACHE 08/16/2007  . OTITIS MEDIA, ACUTE, LEFT 08/11/2007  . OTHER ABNORMAL BLOOD CHEMISTRY 07/25/2007  . HYPERLIPIDEMIA 05/17/2007  .  Essential hypertension 05/17/2007  . OSTEOARTHRITIS 05/17/2007  . SPINAL STENOSIS 05/17/2007  . History of cardiovascular disorder 05/17/2007   Past Medical History:  Diagnosis Date  . GERD 08/09/2008  . 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 Hyperlipidemia   . HYPERLIPIDEMIA 05/17/2007  . Hypertension   . HYPERTENSION 05/17/2007  . Left ear hearing loss    Mild  . Muscle twitching   . OSTEOARTHRITIS 05/17/2007  . SPINAL STENOSIS 05/17/2007  . Stroke ([place])   . TRANSIENT ISCHEMIC ATTACK, HX OF 05/17/2007    Family History  Problem Relation Age of Onset  . Coronary artery disease Mother   . Diabetes Mother   .  Coronary artery disease Father   . Diabetes Sister        2 sister with DM  . Cancer Other        uncle had rectal cancer  . Hypertension Other   . Hyperlipidemia Other     Past Surgical History:  Procedure Laterality Date  . ABDOMINAL HYSTERECTOMY  1995  . ANKLE SURGERY  2005  . CESAREAN SECTION  1993  . CRANIOTOMY Right 12/17/2015   Procedure: Suboccipital CRANIectomy INTRACRANIAL ANEURYSM FOR VERTEBRAL/BASILAR;  Surgeon: Consuella Lose, MD;  Location: [facility];  Service: Neurosurgery;  Laterality: Right;  suboccipital  . EXTERNAL EAR SURGERY     left side x's 2  . IR ANGIO INTRA EXTRACRAN SEL INTERNAL CAROTID BILAT MOD SED  02/16/2017  . IR ANGIO VERTEBRAL SEL VERTEBRAL BILAT MOD SED  02/16/2017  . KNEE SURGERY  02/2011  . ORIF ELBOW FRACTURE  02/2011  . SKIN GRAFT FULL THICKNESS ARM    . SKIN GRAFT FULL THICKNESS LEG    . STAPEDECTOMY    . throidectomy    . THYROIDECTOMY, PARTIAL Right 1980  . TUBAL LIGATION     Social History   Occupational History  . Occupation: realtor  Tobacco Use  . Smoking status: Never Smoker  . Smokeless tobacco: Never Used  . Tobacco comment: Married, she is a Licensed conveyancer and Sexual Activity  . Alcohol use: No  . Drug use: No  . Sexual activity: Not on file

## 2018-08-22 ENCOUNTER — Ambulatory Visit (INDEPENDENT_AMBULATORY_CARE_PROVIDER_SITE_OTHER): Payer: Medicare HMO | Admitting: Family Medicine

## 2018-08-22 ENCOUNTER — Encounter: Payer: Self-pay | Admitting: Family Medicine

## 2018-08-22 VITALS — BP 140/84 | HR 97 | Temp 98.2°F | Ht 66.0 in | Wt 240.8 lb

## 2018-08-22 DIAGNOSIS — E876 Hypokalemia: Secondary | ICD-10-CM

## 2018-08-22 DIAGNOSIS — R7309 Other abnormal glucose: Secondary | ICD-10-CM

## 2018-08-22 DIAGNOSIS — D582 Other hemoglobinopathies: Secondary | ICD-10-CM

## 2018-08-22 LAB — CBC WITH DIFFERENTIAL/PLATELET
BASOS PCT: 0.5 % (ref 0.0–3.0)
Basophils Absolute: 0 10*3/uL (ref 0.0–0.1)
EOS ABS: 0.1 10*3/uL (ref 0.0–0.7)
EOS PCT: 1 % (ref 0.0–5.0)
HEMATOCRIT: 48 % — AB (ref 36.0–46.0)
HEMOGLOBIN: 16.3 g/dL — AB (ref 12.0–15.0)
LYMPHS PCT: 23.7 % (ref 12.0–46.0)
Lymphs Abs: 1.7 10*3/uL (ref 0.7–4.0)
MCHC: 34 g/dL (ref 30.0–36.0)
MCV: 87.2 fl (ref 78.0–100.0)
Monocytes Absolute: 0.6 10*3/uL (ref 0.1–1.0)
Monocytes Relative: 7.5 % (ref 3.0–12.0)
Neutro Abs: 4.9 10*3/uL (ref 1.4–7.7)
Neutrophils Relative %: 67.3 % (ref 43.0–77.0)
Platelets: 399 10*3/uL (ref 150.0–400.0)
RBC: 5.51 Mil/uL — AB (ref 3.87–5.11)
RDW: 13.9 % (ref 11.5–15.5)
WBC: 7.3 10*3/uL (ref 4.0–10.5)

## 2018-08-22 LAB — BASIC METABOLIC PANEL
BUN: 20 mg/dL (ref 6–23)
CHLORIDE: 103 meq/L (ref 96–112)
CO2: 28 mEq/L (ref 19–32)
CREATININE: 0.97 mg/dL (ref 0.40–1.20)
Calcium: 10.1 mg/dL (ref 8.4–10.5)
GFR: 57.64 mL/min — ABNORMAL LOW (ref >60.00)
Glucose, Bld: 93 mg/dL (ref 70–99)
POTASSIUM: 4.9 meq/L (ref 3.5–5.1)
Sodium: 138 mEq/L (ref 135–145)

## 2018-08-22 LAB — HEMOGLOBIN A1C: HEMOGLOBIN A1C: 5.4 % (ref 4.6–6.5)
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
## 2018-08-22 NOTE — Addendum Note (Signed)
Addended by: Elmer Picker on: 08/22/2018 10:21 AM   Modules accepted: Orders

## 2018-08-28 ENCOUNTER — Encounter: Payer: Self-pay | Admitting: Family Medicine

## 2018-08-29 ENCOUNTER — Other Ambulatory Visit: Payer: Self-pay | Admitting: Family Medicine

## 2018-08-29 DIAGNOSIS — E785 Hyperlipidemia, unspecified: Secondary | ICD-10-CM

## 2018-08-29 MED ORDER — PRAVASTATIN SODIUM 10 MG PO TABS: 10.0000 mg | ORAL_TABLET | Freq: Every day | ORAL | 2 refills | Status: DC

## 2018-09-12 DIAGNOSIS — Z800 Family history of malignant neoplasm of digestive organs: Secondary | ICD-10-CM | POA: Diagnosis not present

## 2018-09-12 DIAGNOSIS — Z1211 Encounter for screening for malignant neoplasm of colon: Secondary | ICD-10-CM | POA: Diagnosis not present

## 2018-09-12 DIAGNOSIS — K625 Hemorrhage of anus and rectum: Secondary | ICD-10-CM | POA: Diagnosis not present

## 2018-09-28 DIAGNOSIS — M48062 Spinal stenosis, lumbar region with neurogenic claudication: Secondary | ICD-10-CM | POA: Diagnosis not present

## 2018-11-21 ENCOUNTER — Other Ambulatory Visit: Payer: Self-pay | Admitting: Family Medicine

## 2018-11-23 DIAGNOSIS — M48062 Spinal stenosis, lumbar region with neurogenic claudication: Secondary | ICD-10-CM | POA: Diagnosis not present

## 2018-11-23 DIAGNOSIS — Z6839 Body mass index (BMI) 39.0-39.9, adult: Secondary | ICD-10-CM | POA: Diagnosis not present

## 2018-11-23 DIAGNOSIS — I10 Essential (primary) hypertension: Secondary | ICD-10-CM | POA: Diagnosis not present

## 2018-11-30 NOTE — Telephone Encounter (Signed)
cb is [phone] Please call pt about refill Pharm requested 05/04, have not approved/denied

## 2019-01-10 DIAGNOSIS — M461 Sacroiliitis, not elsewhere classified: Secondary | ICD-10-CM | POA: Diagnosis not present

## 2019-01-11 ENCOUNTER — Telehealth: Payer: Self-pay | Admitting: Family Medicine

## 2019-01-11 NOTE — Telephone Encounter (Signed)
Pt has spinal stenosis in  her back and she would like to know if diet pills(weight loss) will help with her stenosis

## 2019-01-12 NOTE — Telephone Encounter (Signed)
Spoke with patient. Informed her of Dr. Maudie Mercury message. Patient verbalized understanding. Patient verbalized she has tried weight watchers, [employer], and tries to exercise. Patient report she is unable to exercise like she wants d/t back pain. Patient is also receiving back injections.

## 2019-01-12 NOTE — Telephone Encounter (Signed)
Please let patient know that Dr. Ethlyn Gallery is out of the office and will be back Monday to address. I personally feel that a healthy low sugar diet and regular exercise for weight reduction can help a lot with back pain.  I personally use diet pills very little in my practice as they tend to have significant side effects and the benefit is not great alone without significant lifestyle changes. Dr. Ethlyn Gallery may have additional advise and suggestions regarding weight loss medications. Would advise appointment with Dr. Ethlyn Gallery is back in the office to discuss.

## 2019-01-16 NOTE — Telephone Encounter (Signed)
Sounds like she has tried some good diet plans. Always harder to lose weight when exercise is limited.   Typical "diet pills" usually involve form of stimulant which can increase blood pressure, heart rate, and I would not recommend with her medical history. There are some others that help for people who tend to be more overeaters, and we can discuss this if desired.   Another option is doing something like [facility] weight [facility] where they are monitoring, working with her very closely to lose weight. I do think that weight loss helps back/joint pain in general.

## 2019-01-17 NOTE — Telephone Encounter (Signed)
I called the pt and informed her of the message below.  Patient declined scheduling an appt at this time.

## 2019-01-26 ENCOUNTER — Ambulatory Visit: Payer: Self-pay | Admitting: Family Medicine

## 2019-01-26 ENCOUNTER — Other Ambulatory Visit: Payer: Self-pay

## 2019-01-26 ENCOUNTER — Ambulatory Visit (INDEPENDENT_AMBULATORY_CARE_PROVIDER_SITE_OTHER): Payer: Medicare HMO | Admitting: Family Medicine

## 2019-01-26 ENCOUNTER — Encounter: Payer: Self-pay | Admitting: Family Medicine

## 2019-01-26 DIAGNOSIS — Z20828 Contact with and (suspected) exposure to other viral communicable diseases: Secondary | ICD-10-CM | POA: Diagnosis not present

## 2019-01-26 DIAGNOSIS — Z20822 Contact with and (suspected) exposure to covid-19: Secondary | ICD-10-CM

## 2019-01-26 NOTE — Telephone Encounter (Signed)
Pt scheduled with Dr Maudie Mercury today.

## 2019-01-26 NOTE — Telephone Encounter (Signed)
Pt. Reports she had a COVID 19 exposure at work. Last contact was last Thursday. Pt. Does not have symptoms.Woulg like to be tested.Warm transfer to [facility] in the practice for virtual visit.  Answer Assessment - Initial Assessment Questions 1. CLOSE CONTACT: "Who is the person with the confirmed or suspected COVID-19 infection that you were exposed to?"     Co-worker 2. PLACE of CONTACT: "Where were you when you were exposed to COVID-19?" (e.g., home, school, medical waiting room; which city?)     Office 3. TYPE of CONTACT: "How much contact was there?" (e.g., sitting next to, live in same house, work in same office, same building)     Office setting 4. DURATION of CONTACT: "How long were you in contact with the COVID-19 patient?" (e.g., a few seconds, passed by person, a few minutes, live with the patient)     Few minutes 5. DATE of CONTACT: "When did you have contact with a COVID-19 patient?" (e.g., how many days ago)     Last Thursday 6. TRAVEL: "Have you traveled out of the country recently?" If so, "When and where?"     * Also ask about out-of-state travel, since the CDC has identified some high-risk cities for community spread in the [place].     * Note: Travel becomes less relevant if there is widespread community transmission where the patient lives.     No 7. COMMUNITY SPREAD: "Are there lots of cases of COVID-19 (community spread) where you live?" (See public health department website, if unsure)       Yes 8. SYMPTOMS: "Do you have any symptoms?" (e.g., fever, cough, breathing difficulty)     No 9. PREGNANCY OR POSTPARTUM: "Is there any chance you are pregnant?" "When was your last menstrual period?" "Did you deliver in the last 2 weeks?"     No 10. HIGH RISK: "Do you have any heart or lung problems? Do you have a weak immune system?" (e.g., CHF, COPD, asthma, HIV positive, chemotherapy, renal failure, diabetes mellitus, sickle cell anemia)       HTN  Protocols used: CORONAVIRUS  (COVID-19) EXPOSURE-A-AH

## 2019-01-26 NOTE — Patient Instructions (Signed)
I sent a referral for Coronavirus (COVID19) testing for you. Please call our office if you have any concerns or questions or this testing has not been arranged in the next 24-48 hours.    Follow up: if any symptoms develop, you have a positive test or any concerns or questions.
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
## 2019-01-27 ENCOUNTER — Telehealth: Payer: Self-pay | Admitting: *Deleted

## 2019-01-27 DIAGNOSIS — Z20822 Contact with and (suspected) exposure to covid-19: Secondary | ICD-10-CM

## 2019-01-27 NOTE — Telephone Encounter (Signed)
Pt scheduled for covid testing 01/30/19 @ 9:00am @ [facility] order placed and Instructions given.

## 2019-01-27 NOTE — Telephone Encounter (Signed)
-----   Message from [name], DO sent at 01/26/2019  4:02 PM EDT ----- Regarding: [phone] testing Please test for COVID19. Close exsposure to case at work without mask or social distancing. Pt is asymptomatic currently. Pt is > 65 with obesity and HTN, she also see her elderly mother on a regular basis.

## 2019-01-30 ENCOUNTER — Other Ambulatory Visit: Payer: Self-pay

## 2019-01-30 DIAGNOSIS — R6889 Other general symptoms and signs: Secondary | ICD-10-CM | POA: Diagnosis not present

## 2019-01-30 DIAGNOSIS — Z20822 Contact with and (suspected) exposure to covid-19: Secondary | ICD-10-CM

## 2019-02-03 LAB — NOVEL CORONAVIRUS, NAA: SARS-CoV-2, NAA: NOT DETECTED

## 2019-02-08 DIAGNOSIS — M25552 Pain in left hip: Secondary | ICD-10-CM | POA: Diagnosis not present

## 2019-02-08 DIAGNOSIS — M461 Sacroiliitis, not elsewhere classified: Secondary | ICD-10-CM | POA: Diagnosis not present

## 2019-02-16 DIAGNOSIS — H2513 Age-related nuclear cataract, bilateral: Secondary | ICD-10-CM | POA: Diagnosis not present

## 2019-02-24 DIAGNOSIS — H524 Presbyopia: Secondary | ICD-10-CM | POA: Diagnosis not present

## 2019-02-24 DIAGNOSIS — H52223 Regular astigmatism, bilateral: Secondary | ICD-10-CM | POA: Diagnosis not present

## 2019-03-01 DIAGNOSIS — M1612 Unilateral primary osteoarthritis, left hip: Secondary | ICD-10-CM | POA: Diagnosis not present

## 2019-04-04 DIAGNOSIS — M461 Sacroiliitis, not elsewhere classified: Secondary | ICD-10-CM | POA: Diagnosis not present

## 2019-05-20 ENCOUNTER — Other Ambulatory Visit: Payer: Self-pay | Admitting: Endocrinology

## 2019-05-20 NOTE — Telephone Encounter (Signed)
Please forward refill request to pt's primary care provider.   

## 2019-05-22 NOTE — Telephone Encounter (Signed)
Per Dr. Ellison's request, I am forwarding this refill request. Please review and refill if appropriate.  

## 2019-06-17 ENCOUNTER — Other Ambulatory Visit: Payer: Self-pay | Admitting: Family Medicine

## 2019-06-21 NOTE — Telephone Encounter (Signed)
Last OV 01/26/19 Last refill 11/30/18 #30/5 Next OV not scheduled

## 2019-08-04 ENCOUNTER — Ambulatory Visit: Payer: Medicare HMO | Admitting: Family Medicine

## 2019-08-04 NOTE — Progress Notes (Deleted)
  Kelly Wilson DOB: 1955-08-01 Encounter date: 08/04/2019  This is a 64 y.o. female who presents with No chief complaint on file.   History of present illness:  HPI   Allergies  Allergen Reactions  . Morphine Hives  . Lipitor [Atorvastatin Calcium]   . Rosuvastatin Other (See Comments)    myalgia   No outpatient medications have been marked as taking for the 08/04/19 encounter (Appointment) with Caren Macadam, MD.    Review of Systems  Objective:  There were no vitals taken for this visit.      BP Readings from Last 3 Encounters:  08/22/18 140/84  04/27/18 132/80  03/28/18 (!) 188/83   Wt Readings from Last 3 Encounters:  08/22/18 240 lb 12.8 oz (109.2 kg)  04/27/18 242 lb 3.2 oz (109.9 kg)  03/05/17 231 lb 9.6 oz (105.1 kg)    Physical Exam  Assessment/Plan  There are no diagnoses linked to this encounter.       Kelly Rough, MD

## 2019-08-17 DIAGNOSIS — Z20828 Contact with and (suspected) exposure to other viral communicable diseases: Secondary | ICD-10-CM | POA: Diagnosis not present

## 2019-09-06 ENCOUNTER — Other Ambulatory Visit: Payer: Self-pay | Admitting: *Deleted

## 2019-09-06 MED ORDER — LOSARTAN POTASSIUM-HCTZ 50-12.5 MG PO TABS: 1.0000 | ORAL_TABLET | Freq: Every day | ORAL | 0 refills | Status: DC

## 2019-09-06 NOTE — Telephone Encounter (Signed)
Rx done. 

## 2019-09-08 ENCOUNTER — Other Ambulatory Visit: Payer: Self-pay | Admitting: Family Medicine

## 2019-09-18 ENCOUNTER — Other Ambulatory Visit: Payer: Self-pay | Admitting: Family Medicine

## 2019-10-07 ENCOUNTER — Other Ambulatory Visit: Payer: Self-pay | Admitting: Family Medicine

## 2019-10-11 ENCOUNTER — Other Ambulatory Visit: Payer: Self-pay | Admitting: Family Medicine

## 2019-10-12 DIAGNOSIS — D1039 Benign neoplasm of other parts of mouth: Secondary | ICD-10-CM | POA: Diagnosis not present

## 2019-11-22 DIAGNOSIS — H2513 Age-related nuclear cataract, bilateral: Secondary | ICD-10-CM | POA: Diagnosis not present

## 2019-11-22 DIAGNOSIS — G43819 Other migraine, intractable, without status migrainosus: Secondary | ICD-10-CM | POA: Diagnosis not present

## 2019-12-17 ENCOUNTER — Other Ambulatory Visit: Payer: Self-pay | Admitting: Family Medicine

## 2020-02-07 ENCOUNTER — Other Ambulatory Visit: Payer: Self-pay | Admitting: Family Medicine

## 2020-03-04 ENCOUNTER — Other Ambulatory Visit: Payer: Self-pay | Admitting: Family Medicine

## 2020-03-11 ENCOUNTER — Encounter: Payer: Self-pay | Admitting: Family Medicine

## 2020-03-11 ENCOUNTER — Telehealth: Payer: Self-pay | Admitting: Family Medicine

## 2020-03-11 MED ORDER — LOSARTAN POTASSIUM-HCTZ 50-12.5 MG PO TABS: 1.0000 | ORAL_TABLET | Freq: Every day | ORAL | 0 refills | Status: DC

## 2020-03-11 NOTE — Telephone Encounter (Signed)
Pt called to say she needs blood pressure medication. She hasn't had any for 2 days. She has a virtual set up for 8/25 @830   Please advise

## 2020-03-11 NOTE — Telephone Encounter (Signed)
Rx done. 

## 2020-03-13 ENCOUNTER — Telehealth (INDEPENDENT_AMBULATORY_CARE_PROVIDER_SITE_OTHER): Payer: Medicare HMO | Admitting: Family Medicine

## 2020-03-13 ENCOUNTER — Encounter: Payer: Self-pay | Admitting: Family Medicine

## 2020-03-13 DIAGNOSIS — I10 Essential (primary) hypertension: Secondary | ICD-10-CM

## 2020-03-13 DIAGNOSIS — Z1159 Encounter for screening for other viral diseases: Secondary | ICD-10-CM

## 2020-03-13 DIAGNOSIS — Z1231 Encounter for screening mammogram for malignant neoplasm of breast: Secondary | ICD-10-CM | POA: Diagnosis not present

## 2020-03-13 DIAGNOSIS — E785 Hyperlipidemia, unspecified: Secondary | ICD-10-CM

## 2020-03-13 DIAGNOSIS — E2839 Other primary ovarian failure: Secondary | ICD-10-CM | POA: Diagnosis not present

## 2020-03-13 MED ORDER — LOSARTAN POTASSIUM-HCTZ 50-12.5 MG PO TABS: 1.0000 | ORAL_TABLET | Freq: Every day | ORAL | 1 refills | Status: DC

## 2020-03-13 NOTE — Patient Instructions (Signed)
Zoster Vaccine, Recombinant injection What is this medicine? ZOSTER VACCINE (ZOS ter vak SEEN) is used to prevent shingles in adults 66 years old and over. This vaccine is not used to treat shingles or nerve pain from shingles. This medicine may be used for other purposes; ask your health care provider or pharmacist if you have questions. COMMON BRAND NAME(S): [facility] What should I tell my health care provider before I take this medicine? They need to know if you have any of these conditions:  blood disorders or disease  cancer like leukemia or lymphoma  immune system problems or therapy  an unusual or allergic reaction to vaccines, other medications, foods, dyes, or preservatives  pregnant or trying to get pregnant  breast-feeding How should I use this medicine? This vaccine is for injection in a muscle. It is given by a health care professional. Talk to your pediatrician regarding the use of this medicine in children. This medicine is not approved for use in children. Overdosage: If you think you have taken too much of this medicine contact a poison control center or emergency room at once. NOTE: This medicine is only for you. Do not share this medicine with others. What if I miss a dose? Keep appointments for follow-up (booster) doses as directed. It is important not to miss your dose. Call your doctor or health care professional if you are unable to keep an appointment. What may interact with this medicine?  medicines that suppress your immune system  medicines to treat cancer  steroid medicines like prednisone or cortisone This list may not describe all possible interactions. Give your health care provider a list of all the medicines, herbs, non-prescription drugs, or dietary supplements you use. Also tell them if you smoke, drink alcohol, or use illegal drugs. Some items may interact with your medicine. What should I watch for while using this medicine? Visit your doctor for  regular check ups. This vaccine, like all vaccines, may not fully protect everyone. What side effects may I notice from receiving this medicine? Side effects that you should report to your doctor or health care professional as soon as possible:  allergic reactions like skin rash, itching or hives, swelling of the face, lips, or tongue  breathing problems Side effects that usually do not require medical attention (report these to your doctor or health care professional if they continue or are bothersome):  chills  headache  fever  nausea, vomiting  redness, warmth, pain, swelling or itching at site where injected  tiredness This list may not describe all possible side effects. Call your doctor for medical advice about side effects. You may report side effects to FDA at 1-800-FDA-1088. Where should I keep my medicine? This vaccine is only given in a clinic, pharmacy, doctor's office, or other health care setting and will not be stored at home. NOTE: This sheet is a summary. It may not cover all possible information. If you have questions about this medicine, talk to your doctor, pharmacist, or health care provider.  2020 Elsevier/Gold Standard (2017-02-15 13:20:30) Pneumococcal Polysaccharide Vaccine (PPSV23): What You Need to Know 1. Why get vaccinated? Pneumococcal polysaccharide vaccine (PPSV23) can prevent pneumococcal disease. Pneumococcal disease refers to any illness caused by pneumococcal bacteria. These bacteria can cause many types of illnesses, including pneumonia, which is an infection of the lungs. Pneumococcal bacteria are one of the most common causes of pneumonia. Besides pneumonia, pneumococcal bacteria can also cause:  Ear infections  Sinus infections  Meningitis (infection of the  tissue covering the brain and spinal cord)  Bacteremia (bloodstream infection) Anyone can get pneumococcal disease, but children under 70 years of age, people with certain medical  conditions, adults 67 years or older, and cigarette smokers are at the highest risk. Most pneumococcal infections are mild. However, some can result in long-term problems, such as brain damage or hearing loss. Meningitis, bacteremia, and pneumonia caused by pneumococcal disease can be fatal. 2. PPSV23 PPSV23 protects against 23 types of bacteria that cause pneumococcal disease. PPSV23 is recommended for:  All adults 62 years or older,  Anyone 2 years or older with certain medical conditions that can lead to an increased risk for pneumococcal disease. Most people need only one dose of PPSV23. A second dose of PPSV23, and another type of pneumococcal vaccine called PCV13, are recommended for certain high-risk groups. Your health care provider can give you more information. People 65 years or older should get a dose of PPSV23 even if they have already gotten one or more doses of the vaccine before they turned 32. 3. Talk with your health care provider Tell your vaccine provider if the person getting the vaccine:  Has had an allergic reaction after a previous dose of PPSV23, or has any severe, life-threatening allergies. In some cases, your health care provider may decide to postpone PPSV23 vaccination to a future visit. People with minor illnesses, such as a cold, may be vaccinated. People who are moderately or severely ill should usually wait until they recover before getting PPSV23. Your health care provider can give you more information. 4. Risks of a vaccine reaction  Redness or pain where the shot is given, feeling tired, fever, or muscle aches can happen after PPSV23. People sometimes faint after medical procedures, including vaccination. Tell your provider if you feel dizzy or have vision changes or ringing in the ears. As with any medicine, there is a very remote chance of a vaccine causing a severe allergic reaction, other serious injury, or death. 5. What if there is a serious  problem? An allergic reaction could occur after the vaccinated person leaves the clinic. If you see signs of a severe allergic reaction (hives, swelling of the face and throat, difficulty breathing, a fast heartbeat, dizziness, or weakness), call 9-1-1 and get the person to the nearest hospital. For other signs that concern you, call your health care provider. Adverse reactions should be reported to the Vaccine Adverse Event Reporting System (VAERS). Your health care provider will usually file this report, or you can do it yourself. Visit the VAERS website at www.vaers.SamedayNews.es or call 601-075-4688. VAERS is only for reporting reactions, and VAERS staff do not give medical advice. 6. How can I learn more?  Ask your health care provider.  Call your local or state health department.  Contact the Centers for Disease Control and Prevention (CDC): ? Call (763)178-8258 (1-800-CDC-INFO) or ? Visit CDC's website at http://hunter.com/ CDC Vaccine Information Statement PPSV23 Vaccine (05/18/2018) This information is not intended to replace advice given to you by your health care provider. Make sure you discuss any questions you have with your health care provider. Document Revised: 10/25/2018 Document Reviewed: 02/15/2018 Elsevier Patient Education  [place].

## 2020-03-13 NOTE — Progress Notes (Signed)
Virtual Visit via Video Note  I connected with Kelly Wilson on 03/13/20 at  8:30 AM EDT by a video enabled telemedicine application and verified that I am speaking with the correct person using two identifiers.  Location patient: home Location provider: [place], [place] [address] Persons participating in the virtual visit: patient, provider  I discussed the limitations of evaluation and management by telemedicine and the availability of in person appointments. The patient expressed understanding and agreed to proceed.   Kelly Wilson DOB: 05/19/1956 Encounter date: 03/13/2020  This is a 64 y.o. female who presents with Chief Complaint  Patient presents with  . Medication Refill    History of present illness: Doing fine overall. No specific concerns today.   HTN:on hyzaar 50-12.5. Does check at home and getting about 140/75. After taking bp medications will see down to 120. Doesn't usually go higher than the 140.   HL: we started low dose pravastatin at last visit. She is still taking this. Hasn't had bloodwork done since starting. Seemed to tolerate it ok.   Started taking celebrex. Saw pain management and was getting steroid injections in back for back/hip pain. Hasn't seen him since last fall. Still having issues with hip. Seeing chiropractor which helps a lot. Feels like she has made a lot of progress although still not 100%. Was taking a lot more ibuprofen in the past as well as tylenol, so not needing as much now that she started the celebrex.   GERD: Had endoscopy in past. Has had esophagus stretched. Taking 20mg  of omeprazole once daily.   Not taking potassium supplement any longer, but still taking magnesium.   Allergies  Allergen Reactions  . Morphine Hives  . Lipitor [Atorvastatin Calcium]   . Rosuvastatin Other (See Comments)    myalgia   Current Meds  Medication Sig  . acetaminophen (TYLENOL) 500 MG tablet Take  1,500 mg by mouth 2 (two) times daily as needed (for pain/shoulder pain.).  Marland Kitchen aspirin 325 MG tablet Take 325 mg by mouth daily.  . Celecoxib (CELEBREX PO) Take by mouth daily.  Marland Kitchen losartan-hydrochlorothiazide (HYZAAR) 50-12.5 MG tablet Take 1 tablet by mouth daily.  . Magnesium 500 MG TABS Take 500 mg by mouth daily.  . Omega-3 Fatty Acids (FISH OIL PO) Take 720 mg by mouth daily.  Marland Kitchen omeprazole (PRILOSEC) 40 MG capsule Take 1 capsule (40 mg total) by mouth daily.  . potassium chloride (K-DUR) 10 MEQ tablet Take 10 mEq by mouth daily.  . pravastatin (PRAVACHOL) 10 MG tablet TAKE 1 TABLET(10 MG) BY MOUTH DAILY  . [DISCONTINUED] losartan-hydrochlorothiazide (HYZAAR) 50-12.5 MG tablet Take 1 tablet by mouth daily.    Review of Systems  Constitutional: Negative for chills, fatigue and fever.  Respiratory: Negative for cough, chest tightness, shortness of breath and wheezing.   Cardiovascular: Negative for chest pain, palpitations and leg swelling.    Objective:  There were no vitals taken for this visit.      BP Readings from Last 3 Encounters:  08/22/18 140/84  04/27/18 132/80  03/28/18 (!) 188/83   Wt Readings from Last 3 Encounters:  08/22/18 240 lb 12.8 oz (109.2 kg)  04/27/18 242 lb 3.2 oz (109.9 kg)  03/05/17 231 lb 9.6 oz (105.1 kg)  XAM:  VITALS per patient if applicable: see above  GENERAL: alert, oriented, appears well and in no acute distress  HEENT: atraumatic, conjunctiva clear, no obvious abnormalities on inspection of external  nose and ears  NECK: normal movements of the head and neck  LUNGS: on inspection no signs of respiratory distress, breathing rate appears normal, no obvious gross SOB, gasping or wheezing  CV: no obvious cyanosis  MS: moves all visible extremities without noticeable abnormality  PSYCH/NEURO: pleasant and cooperative, no obvious depression or anxiety, speech and thought processing grossly intact   ASSESSMENT AND PLAN:  Discussed the  following assessment and plan:  1. Essential hypertension Blood pressure stable at home, but perhaps slightly higher than desired.  We will have her continue to check at home and follow-up with cough at next office visit so we can compare to ours.  Medicine was refilled today. - CBC with Differential/Platelet; Future - Comprehensive metabolic panel; Future - losartan-hydrochlorothiazide (HYZAAR) 50-12.5 MG tablet; Take 1 tablet by mouth daily.  Dispense: 90 tablet; Refill: 1  2. Hyperlipidemia, unspecified hyperlipidemia type She has done well with the pravastatin.  We will recheck cholesterol levels to see if they have improved on this medication.  Dose adjustment pending results. - Lipid panel; Future  3. Encounter for hepatitis C screening test for low risk patient - Hepatitis C antibody; Future  4. Encounter for screening mammogram for malignant neoplasm of breast - MM DIGITAL SCREENING BILATERAL; Future  5. Estrogen deficiency - DG Bone Density; Future  I discussed the assessment and treatment plan with the patient. The patient was provided an opportunity to ask questions and all were answered. The patient agreed with the plan and demonstrated an understanding of the instructions.   The patient was advised to call back or seek an in-person evaluation if the symptoms worsen or if the condition fails to improve as anticipated.  I provided 30 minutes of non-face-to-face time during this encounter. Total time 30 minutes including patient encounter, chart review, charting   Micheline Rough, MD

## 2020-03-18 ENCOUNTER — Other Ambulatory Visit: Payer: Self-pay | Admitting: Neurosurgery

## 2020-03-18 DIAGNOSIS — I609 Nontraumatic subarachnoid hemorrhage, unspecified: Secondary | ICD-10-CM

## 2020-03-19 ENCOUNTER — Telehealth: Payer: Self-pay | Admitting: *Deleted

## 2020-03-19 NOTE — Telephone Encounter (Signed)
Spoke with the pt and scheduled appts as below.  

## 2020-03-19 NOTE — Telephone Encounter (Signed)
-----   Message from Caren Macadam, MD sent at 03/13/2020  9:10 AM EDT ----- Please call her to set up lab visit when she is able. Also if she would like can get pneumonia 23 vaccine nurse visit (or wait til November physical)

## 2020-03-21 ENCOUNTER — Other Ambulatory Visit: Payer: Medicare HMO

## 2020-03-21 ENCOUNTER — Other Ambulatory Visit: Payer: Self-pay

## 2020-03-21 ENCOUNTER — Other Ambulatory Visit: Payer: Self-pay | Admitting: Family Medicine

## 2020-03-21 ENCOUNTER — Ambulatory Visit (INDEPENDENT_AMBULATORY_CARE_PROVIDER_SITE_OTHER): Payer: Medicare HMO | Admitting: *Deleted

## 2020-03-21 DIAGNOSIS — E785 Hyperlipidemia, unspecified: Secondary | ICD-10-CM

## 2020-03-21 DIAGNOSIS — I10 Essential (primary) hypertension: Secondary | ICD-10-CM | POA: Diagnosis not present

## 2020-03-21 DIAGNOSIS — Z1159 Encounter for screening for other viral diseases: Secondary | ICD-10-CM

## 2020-03-21 DIAGNOSIS — Z23 Encounter for immunization: Secondary | ICD-10-CM | POA: Diagnosis not present

## 2020-03-22 LAB — COMPREHENSIVE METABOLIC PANEL
AG Ratio: 2 (calc) (ref 1.0–2.5)
ALT: 10 U/L (ref 6–29)
AST: 14 U/L (ref 10–35)
Albumin: 4.3 g/dL (ref 3.6–5.1)
Alkaline phosphatase (APISO): 71 U/L (ref 37–153)
BUN: 18 mg/dL (ref 7–25)
CO2: 23 mmol/L (ref 20–32)
Calcium: 9.6 mg/dL (ref 8.6–10.4)
Chloride: 104 mmol/L (ref 98–110)
Creat: 0.92 mg/dL (ref 0.50–0.99)
Globulin: 2.1 g/dL (calc) (ref 1.9–3.7)
Glucose, Bld: 98 mg/dL (ref 65–99)
Potassium: 4.2 mmol/L (ref 3.5–5.3)
Sodium: 139 mmol/L (ref 135–146)
Total Bilirubin: 0.5 mg/dL (ref 0.2–1.2)
Total Protein: 6.4 g/dL (ref 6.1–8.1)

## 2020-03-22 LAB — CBC WITH DIFFERENTIAL/PLATELET
Absolute Monocytes: 419 cells/uL (ref 200–950)
Basophils Absolute: 42 cells/uL (ref 0–200)
Basophils Relative: 0.8 %
Eosinophils Absolute: 90 cells/uL (ref 15–500)
Eosinophils Relative: 1.7 %
HCT: 45.2 % — ABNORMAL HIGH (ref 35.0–45.0)
Hemoglobin: 15.1 g/dL (ref 11.7–15.5)
Lymphs Abs: 1696 cells/uL (ref 850–3900)
MCH: 29.3 pg (ref 27.0–33.0)
MCHC: 33.4 g/dL (ref 32.0–36.0)
MCV: 87.8 fL (ref 80.0–100.0)
MPV: 10 fL (ref 7.5–12.5)
Monocytes Relative: 7.9 %
Neutro Abs: 3053 cells/uL (ref 1500–7800)
Neutrophils Relative %: 57.6 %
Platelets: 318 10*3/uL (ref 140–400)
RBC: 5.15 10*6/uL — ABNORMAL HIGH (ref 3.80–5.10)
RDW: 13.7 % (ref 11.0–15.0)
Total Lymphocyte: 32 %
WBC: 5.3 10*3/uL (ref 3.8–10.8)

## 2020-03-22 LAB — LIPID PANEL
Cholesterol: 233 mg/dL — ABNORMAL HIGH (ref <200)
HDL: 73 mg/dL (ref >=50)
LDL Cholesterol (Calc): 133 mg/dL (calc) — ABNORMAL HIGH
Non-HDL Cholesterol (Calc): 160 mg/dL (calc) — ABNORMAL HIGH (ref <130)
Total CHOL/HDL Ratio: 3.2 (calc) (ref <5.0)
Triglycerides: 142 mg/dL (ref <150)

## 2020-03-22 LAB — HEPATITIS C ANTIBODY
Hepatitis C Ab: NONREACTIVE
SIGNAL TO CUT-OFF: 0.05 (ref <1.00)

## 2020-03-24 ENCOUNTER — Other Ambulatory Visit: Payer: Self-pay | Admitting: Family Medicine

## 2020-03-26 ENCOUNTER — Other Ambulatory Visit: Payer: Self-pay | Admitting: Family Medicine

## 2020-03-26 DIAGNOSIS — I10 Essential (primary) hypertension: Secondary | ICD-10-CM

## 2020-05-21 ENCOUNTER — Ambulatory Visit: Payer: Self-pay

## 2020-05-21 ENCOUNTER — Encounter: Payer: Self-pay | Admitting: Orthopaedic Surgery

## 2020-05-21 ENCOUNTER — Ambulatory Visit (INDEPENDENT_AMBULATORY_CARE_PROVIDER_SITE_OTHER): Payer: Medicare HMO

## 2020-05-21 ENCOUNTER — Other Ambulatory Visit: Payer: Self-pay

## 2020-05-21 ENCOUNTER — Ambulatory Visit: Payer: Medicare HMO | Admitting: Orthopaedic Surgery

## 2020-05-21 VITALS — Ht 66.0 in | Wt 230.0 lb

## 2020-05-21 DIAGNOSIS — M1612 Unilateral primary osteoarthritis, left hip: Secondary | ICD-10-CM

## 2020-05-21 DIAGNOSIS — M545 Low back pain, unspecified: Secondary | ICD-10-CM | POA: Diagnosis not present

## 2020-05-21 MED ORDER — TRAMADOL HCL 50 MG PO TABS
50.0000 mg | ORAL_TABLET | Freq: Four times a day (QID) | ORAL | 0 refills | Status: DC | PRN
Start: 2020-05-21 — End: 2020-09-27

## 2020-05-21 NOTE — Progress Notes (Signed)
Subjective: Patient is here for ultrasound-guided intra-articular left hip injection.   Pain for a year.  Objective:  Pain with IR, limited extension.  Procedure: Ultrasound guided injection is preferred based studies that show increased duration, increased effect, greater accuracy, decreased procedural pain, increased response rate, and decreased cost with ultrasound guided versus blind injection.   Verbal informed consent obtained.  Time-out conducted.  Noted no overlying erythema, induration, or other signs of local infection. Ultrasound-guided left hip injection: After sterile prep with Betadine, injected 8 cc 1% lidocaine without epinephrine and 40 mg methylprednisolone using a 22-gauge spinal needle, passing the needle through the iliofemoral ligament into the femoral head/neck junction.  Injectate seen filling joint capsule.  Good immediate relief.
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
## 2020-05-24 ENCOUNTER — Ambulatory Visit (INDEPENDENT_AMBULATORY_CARE_PROVIDER_SITE_OTHER): Payer: Medicare HMO | Admitting: Family Medicine

## 2020-05-24 ENCOUNTER — Other Ambulatory Visit: Payer: Self-pay

## 2020-05-24 ENCOUNTER — Encounter: Payer: Self-pay | Admitting: Family Medicine

## 2020-05-24 VITALS — BP 120/72 | HR 69 | Temp 97.8°F | Ht 66.0 in | Wt 244.9 lb

## 2020-05-24 DIAGNOSIS — I10 Essential (primary) hypertension: Secondary | ICD-10-CM

## 2020-05-24 DIAGNOSIS — E785 Hyperlipidemia, unspecified: Secondary | ICD-10-CM

## 2020-05-24 DIAGNOSIS — K219 Gastro-esophageal reflux disease without esophagitis: Secondary | ICD-10-CM

## 2020-05-24 DIAGNOSIS — Z0000 Encounter for general adult medical examination without abnormal findings: Secondary | ICD-10-CM | POA: Diagnosis not present

## 2020-05-24 MED ORDER — SHINGRIX 50 MCG/0.5ML IM SUSR: 0.5000 mL | Freq: Once | INTRAMUSCULAR | 0 refills | Status: AC

## 2020-05-24 MED ORDER — LOSARTAN POTASSIUM-HCTZ 100-12.5 MG PO TABS: 1.0000 | ORAL_TABLET | Freq: Every day | ORAL | 1 refills | Status: DC

## 2020-05-24 NOTE — Patient Instructions (Signed)
Update me in mychart in a month and let me know how pressures are looking.
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
## 2020-05-26 ENCOUNTER — Encounter: Payer: Self-pay | Admitting: Family Medicine

## 2020-06-25 ENCOUNTER — Other Ambulatory Visit: Payer: Self-pay | Admitting: Family Medicine

## 2020-06-27 ENCOUNTER — Ambulatory Visit
Admission: RE | Admit: 2020-06-27 | Discharge: 2020-06-27 | Disposition: A | Discharge status: Home / Self Care | Payer: Medicare HMO | Source: Ambulatory Visit | Attending: Family Medicine | Admitting: Family Medicine

## 2020-06-27 ENCOUNTER — Other Ambulatory Visit: Payer: Self-pay

## 2020-06-27 DIAGNOSIS — Z1231 Encounter for screening mammogram for malignant neoplasm of breast: Secondary | ICD-10-CM

## 2020-06-27 DIAGNOSIS — Z780 Asymptomatic menopausal state: Secondary | ICD-10-CM | POA: Diagnosis not present

## 2020-06-27 DIAGNOSIS — E2839 Other primary ovarian failure: Secondary | ICD-10-CM

## 2020-06-27 DIAGNOSIS — M8589 Other specified disorders of bone density and structure, multiple sites: Secondary | ICD-10-CM | POA: Diagnosis not present

## 2020-07-04 ENCOUNTER — Other Ambulatory Visit: Payer: Self-pay | Admitting: *Deleted

## 2020-07-04 MED ORDER — ALENDRONATE SODIUM 70 MG PO TABS: 70.0000 mg | ORAL_TABLET | ORAL | 11 refills | Status: DC

## 2020-08-20 ENCOUNTER — Ambulatory Visit: Payer: Medicare HMO | Admitting: Orthopaedic Surgery

## 2020-08-26 ENCOUNTER — Ambulatory Visit: Payer: Medicare HMO | Admitting: Family Medicine

## 2020-09-20 ENCOUNTER — Other Ambulatory Visit: Payer: Self-pay | Admitting: Family Medicine

## 2020-09-25 ENCOUNTER — Other Ambulatory Visit: Payer: Self-pay | Admitting: Family Medicine

## 2020-09-25 DIAGNOSIS — I10 Essential (primary) hypertension: Secondary | ICD-10-CM

## 2020-09-26 ENCOUNTER — Other Ambulatory Visit: Payer: Self-pay

## 2020-09-27 ENCOUNTER — Ambulatory Visit (INDEPENDENT_AMBULATORY_CARE_PROVIDER_SITE_OTHER): Payer: Medicare HMO | Admitting: Family Medicine

## 2020-09-27 ENCOUNTER — Encounter: Payer: Self-pay | Admitting: Family Medicine

## 2020-09-27 VITALS — BP 142/80 | HR 69 | Temp 98.1°F | Ht 66.0 in | Wt 250.0 lb

## 2020-09-27 DIAGNOSIS — R739 Hyperglycemia, unspecified: Secondary | ICD-10-CM | POA: Diagnosis not present

## 2020-09-27 DIAGNOSIS — Z6841 Body Mass Index (BMI) 40.0 and over, adult: Secondary | ICD-10-CM | POA: Diagnosis not present

## 2020-09-27 DIAGNOSIS — E785 Hyperlipidemia, unspecified: Secondary | ICD-10-CM | POA: Diagnosis not present

## 2020-09-27 DIAGNOSIS — M1612 Unilateral primary osteoarthritis, left hip: Secondary | ICD-10-CM | POA: Diagnosis not present

## 2020-09-27 DIAGNOSIS — I10 Essential (primary) hypertension: Secondary | ICD-10-CM | POA: Diagnosis not present

## 2020-09-27 NOTE — Patient Instructions (Addendum)
Check pressures at home still - let me know low's/highs and we can figure out average. Knowing your heart rate is helpful for med determination. Update me in a month and let me know how these are looking.

## 2020-09-27 NOTE — Progress Notes (Signed)
Kelly Wilson DOB: Sep 06, 1955 Encounter date: 09/27/2020  This is a 65 y.o. female who presents with Chief Complaint  Patient presents with  . Follow-up    History of present illness:  We increased hyzaar to 100-12.5 Blood pressure at home has been good: 938'B systolic over high 01'B. She got too low on the 100-12.5, but had to go back to the 50-12.5 due to hypotension.  Has some increased stress in life - husband with dementia, elderly mother with significant hearing loss. She also cares for grandkids once a month - daughter and son in law travel for photography for weddings - they are calming, loving.   Allergies  Allergen Reactions  . Morphine Hives  . Lipitor [Atorvastatin Calcium]   . Rosuvastatin Other (See Comments)    myalgia   Current Meds  Medication Sig  . acetaminophen (TYLENOL) 500 MG tablet Take 1,500 mg by mouth 2 (two) times daily as needed (for pain/shoulder pain.).  Marland Kitchen alendronate (FOSAMAX) 70 MG tablet Take 1 tablet (70 mg total) by mouth every 7 (seven) days. Take with a full glass of water on an empty stomach.  . Celecoxib (CELEBREX PO) Take by mouth daily.  . cholecalciferol (VITAMIN D3) 25 MCG (1000 UNIT) tablet Take 1,000 Units by mouth daily.  Marland Kitchen losartan-hydrochlorothiazide (HYZAAR) 50-12.5 MG tablet   . Magnesium 500 MG TABS Take 500 mg by mouth daily.  . Omega-3 Fatty Acids (FISH OIL PO) Take 720 mg by mouth daily.  Marland Kitchen omeprazole (PRILOSEC) 40 MG capsule Take 1 capsule (40 mg total) by mouth daily.  . pravastatin (PRAVACHOL) 10 MG tablet TAKE 1 TABLET(10 MG) BY MOUTH DAILY    Review of Systems  Constitutional: Negative for chills, fatigue and fever.  Respiratory: Negative for cough, chest tightness, shortness of breath and wheezing.   Cardiovascular: Negative for chest pain, palpitations and leg swelling.    Objective:  BP (!) 142/80 (BP Location: Left Arm, Patient Position: Sitting, Cuff Size: Large)   Pulse 69   Temp 98.1 F (36.7 C)  (Oral)   Ht 5\' 6"  (1.676 m)   Wt 250 lb (113.4 kg)   SpO2 98%   BMI 40.35 kg/m   Weight: 250 lb (113.4 kg)   BP Readings from Last 3 Encounters:  09/27/20 (!) 142/80  05/24/20 120/72  08/22/18 140/84   Wt Readings from Last 3 Encounters:  09/27/20 250 lb (113.4 kg)  05/24/20 244 lb 14.4 oz (111.1 kg)  05/21/20 230 lb (104.3 kg)    Physical Exam Constitutional:      General: She is not in acute distress.    Appearance: She is well-developed.  Cardiovascular:     Rate and Rhythm: Normal rate and regular rhythm.     Heart sounds: Normal heart sounds. No murmur heard. No friction rub.  Pulmonary:     Effort: Pulmonary effort is normal. No respiratory distress.     Breath sounds: Normal breath sounds. No wheezing or rales.  Musculoskeletal:     Right lower leg: No edema.     Left lower leg: No edema.  Neurological:     Mental Status: She is alert and oriented to person, place, and time.  Psychiatric:        Behavior: Behavior normal.     Assessment/Plan  1. Hypertension, unspecified type Numbers at home are better, but she is high here today. My recheck was even higher (160's). She will report back to me in a month. Encouraged checking throughout day/different times  when may be under more stress.  - CBC with Differential/Platelet; Future - Comprehensive metabolic panel; Future  2. Osteoarthritis of left hip, unspecified osteoarthritis type She is following with ortho for this. Does affect her daily.  3. Hyperlipidemia, unspecified hyperlipidemia type Continue current medication. Continue with pravastatin 10mg . Will recheck bloodwork.  - Lipid panel; Future - TSH; Future  4. Hyperglycemia - Hemoglobin A1c; Future    Return for pending home blood pressure report.    Micheline Rough, MD

## 2020-10-02 ENCOUNTER — Other Ambulatory Visit: Payer: Medicare HMO

## 2020-10-25 ENCOUNTER — Other Ambulatory Visit: Payer: Self-pay

## 2020-10-25 ENCOUNTER — Encounter: Payer: Self-pay | Admitting: Orthopaedic Surgery

## 2020-10-25 ENCOUNTER — Ambulatory Visit: Payer: Self-pay

## 2020-10-25 ENCOUNTER — Ambulatory Visit: Payer: Medicare HMO | Admitting: Orthopaedic Surgery

## 2020-10-25 ENCOUNTER — Other Ambulatory Visit: Payer: Self-pay | Admitting: Physician Assistant

## 2020-10-25 VITALS — Ht 67.5 in | Wt 248.2 lb

## 2020-10-25 DIAGNOSIS — M1612 Unilateral primary osteoarthritis, left hip: Secondary | ICD-10-CM | POA: Diagnosis not present

## 2020-10-25 MED ORDER — TRAMADOL HCL 50 MG PO TABS
50.0000 mg | ORAL_TABLET | Freq: Three times a day (TID) | ORAL | 0 refills | Status: DC | PRN
Start: 2020-10-25 — End: 2021-01-14
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
## 2020-10-25 NOTE — Progress Notes (Signed)
Subjective: Patient is here for ultrasound-guided intra-articular left hip injection.   Prior injection helped a couple months.  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  Objective: Pain with passive internal rotation.  Procedure: Ultrasound guided injection is preferred based studies that show increased duration, increased effect, greater accuracy, decreased procedural pain, increased response rate, and decreased cost with ultrasound guided versus blind injection.   Verbal informed consent obtained.  Time-out conducted.  Noted no overlying erythema, induration, or other signs of local infection. Ultrasound-guided left hip injection: After sterile prep with Betadine, injected 4 cc 0.25% bupivacaine without epinephrine and 6 mg betamethasone using a 22-gauge spinal needle, passing the needle through the iliofemoral ligament into the femoral head/neck junction.  Injectate seen filling the joint capsule.  Good immediate relief.

## 2020-12-03 ENCOUNTER — Other Ambulatory Visit: Payer: Self-pay | Admitting: Physician Assistant

## 2020-12-06 NOTE — Telephone Encounter (Signed)
Called into pharm  

## 2020-12-06 NOTE — Telephone Encounter (Signed)
Pt called pharmacy to ensure Tramadol prescription was ready for pick up and they stated it needed providers signature. Pt call back number is [phone] if any questions.

## 2020-12-06 NOTE — Telephone Encounter (Signed)
I usually send this in electronically.  Can you call it in please

## 2020-12-21 ENCOUNTER — Other Ambulatory Visit: Payer: Self-pay | Admitting: Family Medicine

## 2020-12-23 ENCOUNTER — Other Ambulatory Visit: Payer: Self-pay | Admitting: Family Medicine

## 2020-12-23 MED ORDER — LOSARTAN POTASSIUM-HCTZ 50-12.5 MG PO TABS
1.0000 | ORAL_TABLET | Freq: Every day | ORAL | 1 refills | Status: DC
Start: 2020-12-23 — End: 2021-03-26

## 2021-01-14 ENCOUNTER — Other Ambulatory Visit: Payer: Self-pay | Admitting: Physician Assistant

## 2021-01-14 ENCOUNTER — Telehealth: Payer: Self-pay | Admitting: Orthopaedic Surgery

## 2021-01-14 MED ORDER — TRAMADOL HCL 50 MG PO TABS
50.0000 mg | ORAL_TABLET | Freq: Three times a day (TID) | ORAL | 0 refills | Status: DC | PRN
Start: 2021-01-14 — End: 2021-02-21

## 2021-01-14 NOTE — Telephone Encounter (Signed)
Sent in

## 2021-01-14 NOTE — Telephone Encounter (Signed)
Lvm informing pt.

## 2021-01-14 NOTE — Telephone Encounter (Signed)
Pt called wanting a refill on her Tramadol prescription. The best pharmacy is the one on file and the best call back number if needed is [phone].

## 2021-02-04 ENCOUNTER — Other Ambulatory Visit: Payer: Self-pay

## 2021-02-04 ENCOUNTER — Ambulatory Visit: Payer: Self-pay

## 2021-02-04 ENCOUNTER — Ambulatory Visit: Payer: Medicare HMO | Admitting: Orthopaedic Surgery

## 2021-02-04 ENCOUNTER — Encounter: Payer: Self-pay | Admitting: Orthopaedic Surgery

## 2021-02-04 DIAGNOSIS — M1612 Unilateral primary osteoarthritis, left hip: Secondary | ICD-10-CM

## 2021-02-04 NOTE — Progress Notes (Signed)
Subjective: Patient is here for ultrasound-guided intra-articular left hip injection.   Previous injection helped only about 6 weeks.  Her husband has stage IV lung cancer.  Objective: Pain with internal rotation.  Procedure: Ultrasound guided injection is preferred based studies that show increased duration, increased effect, greater accuracy, decreased procedural pain, increased response rate, and decreased cost with ultrasound guided versus blind injection.   Verbal informed consent obtained.  Time-out conducted.  Noted no overlying erythema, induration, or other signs of local infection. Ultrasound-guided left hip injection: After sterile prep with Betadine, injected 4 cc 0.25% bupivacaine without epinephrine and 6 mg betamethasone using a 22-gauge spinal needle, passing the needle through the iliofemoral ligament into the femoral head/neck junction.  Injectate seen filling the joint capsule.
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
## 2021-02-18 ENCOUNTER — Other Ambulatory Visit: Payer: Self-pay | Admitting: Physician Assistant

## 2021-02-19 ENCOUNTER — Telehealth: Payer: Self-pay | Admitting: Orthopaedic Surgery

## 2021-02-19 NOTE — Telephone Encounter (Signed)
Pt called requesting a pre authorization be sent to pharmacy for pt tramadol. Please call and pt and send to pharmacy on file. Pt phone number is [phone].

## 2021-02-20 NOTE — Telephone Encounter (Signed)
Per covermymeds   Loys Shor Key: [id number] help? Call us at [phone] Outcome Additional Information Required Available without authorization.

## 2021-02-20 NOTE — Telephone Encounter (Signed)
Called pharmacy she picked up Rx from 01/14/2021.Marland Kitchen  Basically she just needs a RF. If yes, please send into pharmacy. Thanks.

## 2021-02-21 ENCOUNTER — Other Ambulatory Visit: Payer: Self-pay | Admitting: Physician Assistant

## 2021-02-21 MED ORDER — TRAMADOL HCL 50 MG PO TABS: 50.0000 mg | ORAL_TABLET | Freq: Three times a day (TID) | ORAL | 0 refills | Status: DC | PRN

## 2021-02-21 NOTE — Telephone Encounter (Signed)
Sent in

## 2021-03-21 ENCOUNTER — Other Ambulatory Visit: Payer: Self-pay | Admitting: Family Medicine

## 2021-03-25 ENCOUNTER — Telehealth: Payer: Self-pay | Admitting: Family Medicine

## 2021-03-25 NOTE — Telephone Encounter (Signed)
PT called in to request a refill of their losartan-hydrochlorothiazide (HYZAAR) 50-12.5 MG tablet. They state it should be '100mg'$ . Please advise and send in refill to the [facility] on file.

## 2021-03-26 ENCOUNTER — Other Ambulatory Visit: Payer: Self-pay | Admitting: Family Medicine

## 2021-03-26 ENCOUNTER — Encounter: Payer: Self-pay | Admitting: Family Medicine

## 2021-03-26 MED ORDER — LOSARTAN POTASSIUM-HCTZ 100-12.5 MG PO TABS
1.0000 | ORAL_TABLET | Freq: Every day | ORAL | 1 refills | Status: DC
Start: 2021-03-26 — End: 2021-06-25

## 2021-03-26 NOTE — Telephone Encounter (Signed)
Refill sent. Mychart message sent as well.

## 2021-03-26 NOTE — Telephone Encounter (Signed)
Spoke with the patient and attempted to inform her of the message below.  Patient stated she started taking the '100mg'$  again because her blood pressure was up again and "she does not want to argue about this and her husband died this morning".  Patient informed I did not call to argue with her and wanted to relay the information below.  Message sent to PCP.

## 2021-03-26 NOTE — Telephone Encounter (Signed)
Please clarify dose with her. At last visit she said she was getting hypotensive and returned to the 50-12.'5mg'$  dosing and not the '100mg'$ ?

## 2021-04-02 ENCOUNTER — Telehealth: Payer: Self-pay

## 2021-04-02 NOTE — Telephone Encounter (Signed)
Pt called and would like a refill on her tramadol  

## 2021-04-03 ENCOUNTER — Other Ambulatory Visit: Payer: Self-pay | Admitting: Physician Assistant

## 2021-04-03 MED ORDER — TRAMADOL HCL 50 MG PO TABS: 50.0000 mg | ORAL_TABLET | Freq: Two times a day (BID) | ORAL | 2 refills | Status: DC | PRN

## 2021-04-03 NOTE — Telephone Encounter (Signed)
Sent in

## 2021-04-05 ENCOUNTER — Other Ambulatory Visit: Payer: Self-pay | Admitting: Family Medicine

## 2021-04-10 ENCOUNTER — Telehealth: Payer: Self-pay | Admitting: Orthopaedic Surgery

## 2021-04-10 NOTE — Telephone Encounter (Signed)
Patient would like to proceed with left total hip surgery.  She just lost her husband 2 weeks ago.  She has a daughter living in [place] that has agreed to help her after the surgery.  Please advise if patient needs to return for follow up or appointment for repeat x-rays.  Please provide surgery sheet if surgery is in order. Thanks. We are hold a date of Nov 14th for this patient.   Cb  [phone]

## 2021-04-11 ENCOUNTER — Telehealth: Payer: Self-pay

## 2021-04-11 NOTE — Telephone Encounter (Signed)
Called patient no answer LMOM. She needs a nurse visit to get Pelvis Xrays only and then she can leave afterwards.

## 2021-04-14 ENCOUNTER — Other Ambulatory Visit: Payer: Self-pay

## 2021-04-14 NOTE — Telephone Encounter (Signed)
Tomorrow at 8 good ?

## 2021-04-15 ENCOUNTER — Telehealth: Payer: Self-pay | Admitting: *Deleted

## 2021-04-15 ENCOUNTER — Ambulatory Visit (INDEPENDENT_AMBULATORY_CARE_PROVIDER_SITE_OTHER): Payer: Medicare HMO

## 2021-04-15 ENCOUNTER — Ambulatory Visit: Payer: Medicare HMO

## 2021-04-15 DIAGNOSIS — M1612 Unilateral primary osteoarthritis, left hip: Secondary | ICD-10-CM

## 2021-04-15 NOTE — Progress Notes (Signed)
Patient came in for updated xrays.

## 2021-04-15 NOTE — Telephone Encounter (Signed)
Dr Ethlyn Gallery received a fax from [facility] for a surgical clearance and stated the patient needs an appointment prior to the forms being completed.  I left a detailed message at the patient's cell number to call the office for an appt.  Fax placed on PCP's desk.

## 2021-04-24 ENCOUNTER — Telehealth: Payer: Self-pay | Admitting: Orthopaedic Surgery

## 2021-04-24 ENCOUNTER — Other Ambulatory Visit: Payer: Self-pay | Admitting: Physician Assistant

## 2021-04-24 MED ORDER — HYDROCODONE-ACETAMINOPHEN 5-325 MG PO TABS: 1.0000 | ORAL_TABLET | Freq: Two times a day (BID) | ORAL | 0 refills | Status: DC | PRN

## 2021-04-24 NOTE — Telephone Encounter (Signed)
Patient aware.

## 2021-04-24 NOTE — Telephone Encounter (Signed)
Pt calling stating the tramadol is not helping and is in serious pain and is asking if something stronger can be called in. The best pharmacy is the one on file and the best call back number is [phone].

## 2021-04-24 NOTE — Telephone Encounter (Signed)
Sent in one rx for norco.  Please have her reach out to pcp if she needs more

## 2021-05-05 ENCOUNTER — Ambulatory Visit (INDEPENDENT_AMBULATORY_CARE_PROVIDER_SITE_OTHER): Payer: Medicare HMO | Admitting: Family Medicine

## 2021-05-05 ENCOUNTER — Encounter: Payer: Self-pay | Admitting: Family Medicine

## 2021-05-05 ENCOUNTER — Other Ambulatory Visit: Payer: Self-pay

## 2021-05-05 VITALS — BP 120/82 | HR 67 | Temp 98.6°F | Ht 67.5 in | Wt 222.4 lb

## 2021-05-05 DIAGNOSIS — F4321 Adjustment disorder with depressed mood: Secondary | ICD-10-CM

## 2021-05-05 DIAGNOSIS — R739 Hyperglycemia, unspecified: Secondary | ICD-10-CM | POA: Diagnosis not present

## 2021-05-05 DIAGNOSIS — I10 Essential (primary) hypertension: Secondary | ICD-10-CM | POA: Diagnosis not present

## 2021-05-05 DIAGNOSIS — Z01818 Encounter for other preprocedural examination: Secondary | ICD-10-CM | POA: Diagnosis not present

## 2021-05-05 DIAGNOSIS — E785 Hyperlipidemia, unspecified: Secondary | ICD-10-CM

## 2021-05-05 LAB — CBC WITH DIFFERENTIAL/PLATELET
Basophils Absolute: 0.1 10*3/uL (ref 0.0–0.1)
Basophils Relative: 0.8 % (ref 0.0–3.0)
Eosinophils Absolute: 0.1 10*3/uL (ref 0.0–0.7)
Eosinophils Relative: 1.8 % (ref 0.0–5.0)
HCT: 39 % (ref 36.0–46.0)
Hemoglobin: 12.9 g/dL (ref 12.0–15.0)
Lymphocytes Relative: 26.4 % (ref 12.0–46.0)
Lymphs Abs: 1.7 10*3/uL (ref 0.7–4.0)
MCHC: 33 g/dL (ref 30.0–36.0)
MCV: 87.5 fl (ref 78.0–100.0)
Monocytes Absolute: 0.6 10*3/uL (ref 0.1–1.0)
Monocytes Relative: 8.4 % (ref 3.0–12.0)
Neutro Abs: 4.1 10*3/uL (ref 1.4–7.7)
Neutrophils Relative %: 62.6 % (ref 43.0–77.0)
Platelets: 295 10*3/uL (ref 150.0–400.0)
RBC: 4.46 Mil/uL (ref 3.87–5.11)
RDW: 14.2 % (ref 11.5–15.5)
WBC: 6.6 10*3/uL (ref 4.0–10.5)

## 2021-05-05 LAB — LIPID PANEL
Cholesterol: 212 mg/dL — ABNORMAL HIGH (ref 0–200)
HDL: 67.4 mg/dL (ref >39.00)
LDL Cholesterol: 111 mg/dL — ABNORMAL HIGH (ref 0–99)
NonHDL: 144.77
Total CHOL/HDL Ratio: 3
Triglycerides: 171 mg/dL — ABNORMAL HIGH (ref 0.0–149.0)
VLDL: 34.2 mg/dL (ref 0.0–40.0)

## 2021-05-05 LAB — COMPREHENSIVE METABOLIC PANEL
ALT: 14 U/L (ref 0–35)
AST: 18 U/L (ref 0–37)
Albumin: 4.3 g/dL (ref 3.5–5.2)
Alkaline Phosphatase: 72 U/L (ref 39–117)
BUN: 22 mg/dL (ref 6–23)
CO2: 27 mEq/L (ref 19–32)
Calcium: 9.5 mg/dL (ref 8.4–10.5)
Chloride: 102 mEq/L (ref 96–112)
Creatinine, Ser: 1.03 mg/dL (ref 0.40–1.20)
GFR: 56.19 mL/min — ABNORMAL LOW (ref >60.00)
Glucose, Bld: 90 mg/dL (ref 70–99)
Potassium: 4.4 mEq/L (ref 3.5–5.1)
Sodium: 137 mEq/L (ref 135–145)
Total Bilirubin: 0.4 mg/dL (ref 0.2–1.2)
Total Protein: 6.8 g/dL (ref 6.0–8.3)

## 2021-05-05 LAB — PROTIME-INR
INR: 1 ratio (ref 0.8–1.0)
Prothrombin Time: 11.1 s (ref 9.6–13.1)

## 2021-05-05 LAB — APTT: aPTT: 35.1 s — ABNORMAL HIGH (ref 23.4–32.7)

## 2021-05-05 LAB — HEMOGLOBIN A1C: Hgb A1c MFr Bld: 5.3 % (ref 4.6–6.5)

## 2021-05-05 MED ORDER — CLONAZEPAM 0.5 MG PO TABS
0.5000 mg | ORAL_TABLET | Freq: Two times a day (BID) | ORAL | 1 refills | Status: DC | PRN
Start: 2021-05-05 — End: 2021-11-02
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
## 2021-05-08 MED ORDER — HYDROCODONE-ACETAMINOPHEN 5-325 MG PO TABS: 1.0000 | ORAL_TABLET | Freq: Two times a day (BID) | ORAL | 0 refills | Status: DC | PRN

## 2021-05-27 NOTE — Progress Notes (Signed)
Surgical Instructions    Your procedure is scheduled on 06/02/21.  Report to [facility] Main Entrance "A" at 5:30 A.M., then check in with the Admitting office.  Call this number if you have problems the morning of surgery:  [phone]   If you have any questions prior to your surgery date call [phone]: Open Monday-Friday 8am-4pm    Remember:  Do not eat after midnight the night before your surgery  You may drink clear liquids until 4:30am the morning of your surgery.   Clear liquids allowed are: Water, Non-Citrus Juices (without pulp), Carbonated Beverages, Clear Tea, Black Coffee ONLY (NO MILK, CREAM OR POWDERED CREAMER of any kind), and Gatorade  Patient Instructions  The night before surgery:  No food after midnight. ONLY clear liquids after midnight  The day of surgery (if you do NOT have diabetes):  Drink ONE (1) Pre-Surgery Clear Ensure by 4:30am the morning of surgery. Drink in one sitting. Do not sip.  This drink was given to you during your hospital  pre-op appointment visit. Nothing else to drink after completing the  Pre-Surgery Clear Ensure.         If you have questions, please contact your surgeon's office.     Take these medicines the morning of surgery with A SIP OF WATER  omeprazole (PRILOSEC OTC)  pravastatin (PRAVACHOL)  IF NEEDED: clonazePAM (KLONOPIN)  HYDROcodone-acetaminophen (NORCO)  As of today, STOP taking any Aspirin (unless otherwise instructed by your surgeon) Aleve, Naproxen, Ibuprofen, Motrin, Advil, Goody's, BC's, all herbal medications, fish oil, celecoxib (CELEBREX) and all vitamins.     After your COVID test   You are not required to quarantine however you are required to wear a well-fitting mask when you are out and around people not in your household.  If your mask becomes wet or soiled, replace with a new one.  Wash your hands often with soap and water for 20 seconds or clean your hands with an alcohol-based hand sanitizer  that contains at least 60% alcohol.  Do not share personal items.  Notify your provider: if you are in close contact with someone who has COVID  or if you develop a fever of 100.4 or greater, sneezing, cough, sore throat, shortness of breath or body aches.             Do not wear jewelry or makeup Do not wear lotions, powders, perfumes/colognes, or deodorant. Do not shave 48 hours prior to surgery.  Men may shave face and neck. Do not bring valuables to the hospital. DO Not wear nail polish, gel polish, artificial nails, or any other type of covering on natural nails including finger and toenails. If patients have artificial nails, gel coating, etc. that need to be removed by a nail salon, please have this removed prior to surgery or surgery may need to be canceled/delayed if the surgeon/ anesthesia feels like the patient is unable to be adequately monitored.             [facility] is not responsible for any belongings or valuables.  Do NOT Smoke (Tobacco/Vaping)  24 hours prior to your procedure  If you use a CPAP at night, you may bring your mask for your overnight stay.   Contacts, glasses, hearing aids, dentures or partials may not be worn into surgery, please bring cases for these belongings   For patients admitted to the hospital, discharge time will be determined by your treatment team.   Patients discharged the day of  surgery will not be allowed to drive home, and someone needs to stay with them for 24 hours.  NO VISITORS WILL BE ALLOWED IN PRE-OP WHERE PATIENTS ARE PREPPED FOR SURGERY.  ONLY 1 SUPPORT PERSON MAY BE PRESENT IN THE WAITING ROOM WHILE YOU ARE IN SURGERY.  IF YOU ARE TO BE ADMITTED, ONCE YOU ARE IN YOUR ROOM YOU WILL BE ALLOWED TWO (2) VISITORS. 1 (ONE) VISITOR MAY STAY OVERNIGHT BUT MUST ARRIVE TO THE ROOM BY 8pm.  Minor children may have two parents present. Special consideration for safety and communication needs will be reviewed on a case by case  basis.  Special instructions:    Oral Hygiene is also important to reduce your risk of infection.  Remember - BRUSH YOUR TEETH THE MORNING OF SURGERY WITH YOUR REGULAR TOOTHPASTE   [place]- Preparing For Surgery  Before surgery, you can play an important role. Because skin is not sterile, your skin needs to be as free of germs as possible. You can reduce the number of germs on your skin by washing with CHG (chlorahexidine gluconate) Soap before surgery.  CHG is an antiseptic cleaner which kills germs and bonds with the skin to continue killing germs even after washing.     Please do not use if you have an allergy to CHG or antibacterial soaps. If your skin becomes reddened/irritated stop using the CHG.  Do not shave (including legs and underarms) for at least 48 hours prior to first CHG shower. It is OK to shave your face.  Please follow these instructions carefully.     Shower the NIGHT BEFORE SURGERY and the MORNING OF SURGERY with CHG Soap.   If you chose to wash your hair, wash your hair first as usual with your normal shampoo. After you shampoo, rinse your hair and body thoroughly to remove the shampoo.  Then [employer] and genitals (private parts) with your normal soap and rinse thoroughly to remove soap.  After that Use CHG Soap as you would any other liquid soap. You can apply CHG directly to the skin and wash gently with a scrungie or a clean washcloth.   Apply the CHG Soap to your body ONLY FROM THE NECK DOWN.  Do not use on open wounds or open sores. Avoid contact with your eyes, ears, mouth and genitals (private parts). Wash Face and genitals (private parts)  with your normal soap.   Wash thoroughly, paying special attention to the area where your surgery will be performed.  Thoroughly rinse your body with warm water from the neck down.  DO NOT shower/wash with your normal soap after using and rinsing off the CHG Soap.  Pat yourself dry with a CLEAN TOWEL.  Wear CLEAN  PAJAMAS to bed the night before surgery  Place CLEAN SHEETS on your bed the night before your surgery  DO NOT SLEEP WITH PETS.   Day of Surgery: Take a shower with CHG soap. Wear Clean/Comfortable clothing the morning of surgery Do not apply any deodorants/lotions.   Remember to brush your teeth WITH YOUR REGULAR TOOTHPASTE.   Please read over the following fact sheets that you were given.

## 2021-05-28 ENCOUNTER — Other Ambulatory Visit: Payer: Self-pay | Admitting: Family Medicine

## 2021-05-28 ENCOUNTER — Other Ambulatory Visit: Payer: Self-pay

## 2021-05-28 ENCOUNTER — Encounter (HOSPITAL_COMMUNITY)
Admission: RE | Admit: 2021-05-28 | Discharge: 2021-05-28 | Disposition: A | Discharge status: Home / Self Care | Payer: Medicare HMO | Source: Ambulatory Visit | Attending: Orthopaedic Surgery | Admitting: Orthopaedic Surgery

## 2021-05-28 ENCOUNTER — Encounter (HOSPITAL_COMMUNITY): Payer: Self-pay

## 2021-05-28 VITALS — BP 152/65 | HR 70 | Temp 97.6°F | Resp 17 | Ht 66.0 in | Wt 218.2 lb

## 2021-05-28 DIAGNOSIS — Z01818 Encounter for other preprocedural examination: Secondary | ICD-10-CM | POA: Insufficient documentation

## 2021-05-28 LAB — COMPREHENSIVE METABOLIC PANEL
ALT: 13 U/L (ref 0–44)
AST: 17 U/L (ref 15–41)
Albumin: 3.9 g/dL (ref 3.5–5.0)
Alkaline Phosphatase: 71 U/L (ref 38–126)
Anion gap: 7 (ref 5–15)
BUN: 19 mg/dL (ref 8–23)
CO2: 26 mmol/L (ref 22–32)
Calcium: 9.2 mg/dL (ref 8.9–10.3)
Chloride: 104 mmol/L (ref 98–111)
Creatinine, Ser: 1.01 mg/dL — ABNORMAL HIGH (ref 0.44–1.00)
GFR, Estimated: >60 mL/min (ref >60)
Glucose, Bld: 97 mg/dL (ref 70–99)
Potassium: 4.8 mmol/L (ref 3.5–5.1)
Sodium: 137 mmol/L (ref 135–145)
Total Bilirubin: 0.8 mg/dL (ref 0.3–1.2)
Total Protein: 6.5 g/dL (ref 6.5–8.1)

## 2021-05-28 LAB — CBC WITH DIFFERENTIAL/PLATELET
Abs Immature Granulocytes: 0.02 10*3/uL (ref 0.00–0.07)
Basophils Absolute: 0 10*3/uL (ref 0.0–0.1)
Basophils Relative: 1 %
Eosinophils Absolute: 0.1 10*3/uL (ref 0.0–0.5)
Eosinophils Relative: 2 %
HCT: 41.4 % (ref 36.0–46.0)
Hemoglobin: 13.7 g/dL (ref 12.0–15.0)
Immature Granulocytes: 0 %
Lymphocytes Relative: 26 %
Lymphs Abs: 1.6 10*3/uL (ref 0.7–4.0)
MCH: 29.5 pg (ref 26.0–34.0)
MCHC: 33.1 g/dL (ref 30.0–36.0)
MCV: 89 fL (ref 80.0–100.0)
Monocytes Absolute: 0.5 10*3/uL (ref 0.1–1.0)
Monocytes Relative: 8 %
Neutro Abs: 3.9 10*3/uL (ref 1.7–7.7)
Neutrophils Relative %: 63 %
Platelets: 300 10*3/uL (ref 150–400)
RBC: 4.65 MIL/uL (ref 3.87–5.11)
RDW: 13.2 % (ref 11.5–15.5)
WBC: 6.2 10*3/uL (ref 4.0–10.5)
nRBC: 0 % (ref 0.0–0.2)

## 2021-05-28 LAB — URINALYSIS, ROUTINE W REFLEX MICROSCOPIC
Bilirubin Urine: NEGATIVE
Glucose, UA: NEGATIVE mg/dL
Hgb urine dipstick: NEGATIVE
Ketones, ur: NEGATIVE mg/dL
Leukocytes,Ua: NEGATIVE
Nitrite: NEGATIVE
Protein, ur: NEGATIVE mg/dL
Specific Gravity, Urine: 1.02 (ref 1.005–1.030)
pH: 5 (ref 5.0–8.0)

## 2021-05-28 LAB — PROTIME-INR
INR: 1 (ref 0.8–1.2)
Prothrombin Time: 13.5 seconds (ref 11.4–15.2)

## 2021-05-28 LAB — SURGICAL PCR SCREEN
MRSA, PCR: NEGATIVE
Staphylococcus aureus: NEGATIVE

## 2021-05-28 LAB — TYPE AND SCREEN
ABO/RH(D): O NEG
Antibody Screen: NEGATIVE

## 2021-05-28 LAB — APTT: aPTT: 32 seconds (ref 24–36)

## 2021-05-28 MED ORDER — HYDROCODONE-ACETAMINOPHEN 5-325 MG PO TABS: 1.0000 | ORAL_TABLET | Freq: Two times a day (BID) | ORAL | 0 refills | Status: DC | PRN

## 2021-05-28 NOTE — Progress Notes (Signed)
PCP - junell koberlain Cardiologist - denies  PPM/ICD - denies Device Orders -  Rep Notified -   Chest x-ray - n/a EKG - 05/28/21 Stress Test - denies ECHO - 12/24/15 Cardiac Cath - denies  Sleep Study - denies CPAP - no OSA  No diabetes  As of today, STOP taking any Aspirin (unless otherwise instructed by your surgeon) Aleve, Naproxen, Ibuprofen, Motrin, Advil, Goody's, BC's, all herbal medications, fish oil, celecoxib (CELEBREX) and all vitamins.  ERAS Protcol -yes PRE-SURGERY Ensure or G2- ensure given  COVID TEST- ambulatory surgery   Anesthesia review: no  Patient denies shortness of breath, fever, cough and chest pain at PAT appointment   All instructions explained to the patient, with a verbal understanding of the material. Patient agrees to go over the instructions while at home for a better understanding. Patient also instructed to self quarantine after being tested for COVID-19. The opportunity to ask questions was provided.

## 2021-05-30 ENCOUNTER — Other Ambulatory Visit: Payer: Self-pay | Admitting: Physician Assistant

## 2021-05-30 ENCOUNTER — Telehealth: Payer: Self-pay | Admitting: *Deleted

## 2021-05-30 MED ORDER — OXYCODONE-ACETAMINOPHEN 5-325 MG PO TABS: 1.0000 | ORAL_TABLET | Freq: Four times a day (QID) | ORAL | 0 refills | Status: DC | PRN

## 2021-05-30 MED ORDER — TRANEXAMIC ACID 1000 MG/10ML IV SOLN
2000.0000 mg | INTRAVENOUS | Status: DC
  Filled 2021-05-30: qty 20

## 2021-05-30 MED ORDER — ONDANSETRON HCL 4 MG PO TABS: 4.0000 mg | ORAL_TABLET | Freq: Three times a day (TID) | ORAL | 0 refills | Status: DC | PRN

## 2021-05-30 MED ORDER — METHOCARBAMOL 500 MG PO TABS: 500.0000 mg | ORAL_TABLET | Freq: Two times a day (BID) | ORAL | 2 refills | Status: DC | PRN

## 2021-05-30 MED ORDER — DOCUSATE SODIUM 100 MG PO CAPS: 100.0000 mg | ORAL_CAPSULE | Freq: Every day | ORAL | 2 refills | Status: DC | PRN

## 2021-05-30 MED ORDER — ASPIRIN EC 81 MG PO TBEC: 81.0000 mg | DELAYED_RELEASE_TABLET | Freq: Two times a day (BID) | ORAL | 0 refills | Status: DC

## 2021-05-30 NOTE — Care Plan (Signed)
RNCM call to patient prior to surgery to discuss her Left total hip arthroplasty with Dr. Erlinda Hong. She is an Ortho bundle patient through [facility] and is agreeable to case management. She lives alone, but has a daughter who will be coming to assist her after surgery. She has a RW and 3in1/BSC and needs no other DME. Anticipate HHPT will be needed after a short hospital stay. Reviewed all post op care instructions. Will continue to follow for needs.

## 2021-05-30 NOTE — Telephone Encounter (Signed)
Ortho bundle Pre-op call completed. 

## 2021-06-01 ENCOUNTER — Encounter (HOSPITAL_COMMUNITY): Payer: Self-pay | Admitting: Orthopaedic Surgery

## 2021-06-01 NOTE — Anesthesia Preprocedure Evaluation (Addendum)
Anesthesia Evaluation  Patient identified by MRN, date of birth, ID band Patient awake    Reviewed: Allergy & Precautions, NPO status , Patient's Chart, lab work & pertinent test results  Airway Mallampati: III  TM Distance: >3 FB Neck ROM: Full    Dental no notable dental hx.    Pulmonary neg pulmonary ROS,    Pulmonary exam normal breath sounds clear to auscultation       Cardiovascular hypertension, Pt. on medications Normal cardiovascular exam Rhythm:Regular Rate:Normal  ECG: NSR, rate 68   Neuro/Psych  Headaches, CVA, No Residual Symptoms negative psych ROS   GI/Hepatic Neg liver ROS, GERD  Medicated and Controlled,  Endo/Other  negative endocrine ROS  Renal/GU negative Renal ROS     Musculoskeletal  (+) Arthritis ,   Abdominal (+) + obese,   Peds  Hematology HLD   Anesthesia Other Findings left hip degernerative joint disease  Reproductive/Obstetrics                            Anesthesia Physical Anesthesia Plan  ASA: 2  Anesthesia Plan: Spinal   Post-op Pain Management:    Induction:   PONV Risk Score and Plan: 2 and Ondansetron, Dexamethasone, Propofol infusion and Treatment may vary due to age or medical condition  Airway Management Planned: Simple Face Mask  Additional Equipment:   Intra-op Plan:   Post-operative Plan:   Informed Consent: I have reviewed the patients History and Physical, chart, labs and discussed the procedure including the risks, benefits and alternatives for the proposed anesthesia with the patient or authorized representative who has indicated his/her understanding and acceptance.     Dental advisory given  Plan Discussed with: CRNA  Anesthesia Plan Comments:         Anesthesia Quick Evaluation

## 2021-06-02 ENCOUNTER — Ambulatory Visit (HOSPITAL_COMMUNITY): Payer: Medicare HMO

## 2021-06-02 ENCOUNTER — Encounter (HOSPITAL_COMMUNITY): Payer: Self-pay | Admitting: Orthopaedic Surgery

## 2021-06-02 ENCOUNTER — Observation Stay (HOSPITAL_COMMUNITY): Payer: Medicare HMO

## 2021-06-02 ENCOUNTER — Observation Stay (HOSPITAL_COMMUNITY)
Admission: RE | Admit: 2021-06-02 | Discharge: 2021-06-03 | Disposition: A | Discharge status: Home / Self Care | Payer: Medicare HMO | Attending: Orthopaedic Surgery | Admitting: Orthopaedic Surgery

## 2021-06-02 ENCOUNTER — Ambulatory Visit (HOSPITAL_COMMUNITY): Payer: Medicare HMO | Admitting: Certified Registered Nurse Anesthetist

## 2021-06-02 ENCOUNTER — Other Ambulatory Visit: Payer: Self-pay

## 2021-06-02 ENCOUNTER — Encounter (HOSPITAL_COMMUNITY)
Admission: RE | Disposition: A | Discharge status: Home / Self Care | Payer: Self-pay | Source: Home / Self Care | Attending: Orthopaedic Surgery

## 2021-06-02 DIAGNOSIS — Z471 Aftercare following joint replacement surgery: Secondary | ICD-10-CM | POA: Diagnosis not present

## 2021-06-02 DIAGNOSIS — I10 Essential (primary) hypertension: Secondary | ICD-10-CM | POA: Insufficient documentation

## 2021-06-02 DIAGNOSIS — Z96641 Presence of right artificial hip joint: Secondary | ICD-10-CM | POA: Diagnosis not present

## 2021-06-02 DIAGNOSIS — Z20822 Contact with and (suspected) exposure to covid-19: Secondary | ICD-10-CM | POA: Insufficient documentation

## 2021-06-02 DIAGNOSIS — E785 Hyperlipidemia, unspecified: Secondary | ICD-10-CM | POA: Diagnosis not present

## 2021-06-02 DIAGNOSIS — Z419 Encounter for procedure for purposes other than remedying health state, unspecified: Secondary | ICD-10-CM

## 2021-06-02 DIAGNOSIS — Z96649 Presence of unspecified artificial hip joint: Secondary | ICD-10-CM

## 2021-06-02 DIAGNOSIS — K219 Gastro-esophageal reflux disease without esophagitis: Secondary | ICD-10-CM | POA: Diagnosis not present

## 2021-06-02 DIAGNOSIS — Z96642 Presence of left artificial hip joint: Secondary | ICD-10-CM | POA: Diagnosis not present

## 2021-06-02 DIAGNOSIS — Z79899 Other long term (current) drug therapy: Secondary | ICD-10-CM | POA: Diagnosis not present

## 2021-06-02 DIAGNOSIS — M1612 Unilateral primary osteoarthritis, left hip: Principal | ICD-10-CM

## 2021-06-02 DIAGNOSIS — Z7982 Long term (current) use of aspirin: Secondary | ICD-10-CM | POA: Diagnosis not present

## 2021-06-02 HISTORY — PX: TOTAL HIP ARTHROPLASTY: SHX124

## 2021-06-02 LAB — SARS CORONAVIRUS 2 BY RT PCR (HOSPITAL ORDER, PERFORMED IN ~~LOC~~ HOSPITAL LAB): SARS Coronavirus 2: NEGATIVE

## 2021-06-02 SURGERY — ARTHROPLASTY, HIP, TOTAL, ANTERIOR APPROACH
Anesthesia: Spinal | Site: Hip | Laterality: Left

## 2021-06-02 MED ORDER — VANCOMYCIN HCL 1000 MG IV SOLR
INTRAVENOUS | Status: AC
  Filled 2021-06-02: qty 20

## 2021-06-02 MED ORDER — LACTATED RINGERS IV SOLN: INTRAVENOUS | Status: DC

## 2021-06-02 MED ORDER — PROPOFOL 1000 MG/100ML IV EMUL
INTRAVENOUS | Status: AC
  Filled 2021-06-02: qty 100

## 2021-06-02 MED ORDER — ACETAMINOPHEN 500 MG PO TABS
1000.0000 mg | ORAL_TABLET | Freq: Four times a day (QID) | ORAL | Status: AC
  Administered 2021-06-02 – 2021-06-03 (×4): 1000 mg via ORAL
  Filled 2021-06-02 (×5): qty 2

## 2021-06-02 MED ORDER — MEPERIDINE HCL 25 MG/ML IJ SOLN
6.2500 mg | INTRAMUSCULAR | Status: DC | PRN
  Administered 2021-06-02: 6.25 mg via INTRAVENOUS

## 2021-06-02 MED ORDER — OXYCODONE HCL ER 10 MG PO T12A
10.0000 mg | EXTENDED_RELEASE_TABLET | Freq: Two times a day (BID) | ORAL | Status: DC
  Administered 2021-06-02 – 2021-06-03 (×3): 10 mg via ORAL
  Filled 2021-06-02 (×3): qty 1

## 2021-06-02 MED ORDER — PHENOL 1.4 % MT LIQD: 1.0000 | OROMUCOSAL | Status: DC | PRN

## 2021-06-02 MED ORDER — PROPOFOL 10 MG/ML IV BOLUS
INTRAVENOUS | Status: AC
  Filled 2021-06-02: qty 20

## 2021-06-02 MED ORDER — ACETAMINOPHEN 10 MG/ML IV SOLN: 1000.0000 mg | Freq: Once | INTRAVENOUS | Status: DC | PRN

## 2021-06-02 MED ORDER — METHOCARBAMOL 1000 MG/10ML IJ SOLN
500.0000 mg | Freq: Four times a day (QID) | INTRAVENOUS | Status: DC | PRN
  Filled 2021-06-02: qty 5

## 2021-06-02 MED ORDER — PROPOFOL 500 MG/50ML IV EMUL
INTRAVENOUS | Status: DC | PRN
  Administered 2021-06-02: 50 ug/kg/min via INTRAVENOUS

## 2021-06-02 MED ORDER — METOCLOPRAMIDE HCL 5 MG PO TABS
5.0000 mg | ORAL_TABLET | Freq: Three times a day (TID) | ORAL | Status: DC | PRN
  Administered 2021-06-03: 10 mg via ORAL
  Filled 2021-06-02: qty 2

## 2021-06-02 MED ORDER — POVIDONE-IODINE 10 % EX SWAB: 2.0000 "application " | Freq: Once | CUTANEOUS | Status: DC

## 2021-06-02 MED ORDER — ASPIRIN 81 MG PO CHEW
81.0000 mg | CHEWABLE_TABLET | Freq: Two times a day (BID) | ORAL | Status: DC
  Administered 2021-06-02 – 2021-06-03 (×2): 81 mg via ORAL
  Filled 2021-06-02 (×2): qty 1

## 2021-06-02 MED ORDER — BUPIVACAINE IN DEXTROSE 0.75-8.25 % IT SOLN
INTRATHECAL | Status: DC | PRN
  Administered 2021-06-02: 1.8 mL via INTRATHECAL

## 2021-06-02 MED ORDER — ACETAMINOPHEN 500 MG PO TABS
1000.0000 mg | ORAL_TABLET | Freq: Once | ORAL | Status: DC
  Filled 2021-06-02: qty 2

## 2021-06-02 MED ORDER — FENTANYL CITRATE (PF) 250 MCG/5ML IJ SOLN
INTRAMUSCULAR | Status: AC
  Filled 2021-06-02: qty 5

## 2021-06-02 MED ORDER — METHOCARBAMOL 500 MG PO TABS
500.0000 mg | ORAL_TABLET | Freq: Four times a day (QID) | ORAL | Status: DC | PRN
  Administered 2021-06-02 – 2021-06-03 (×3): 500 mg via ORAL
  Filled 2021-06-02 (×3): qty 1

## 2021-06-02 MED ORDER — PHENYLEPHRINE HCL-NACL 20-0.9 MG/250ML-% IV SOLN
INTRAVENOUS | Status: AC
  Filled 2021-06-02: qty 500

## 2021-06-02 MED ORDER — MIDAZOLAM HCL 5 MG/5ML IJ SOLN
INTRAMUSCULAR | Status: DC | PRN
  Administered 2021-06-02: 2 mg via INTRAVENOUS

## 2021-06-02 MED ORDER — POLYETHYLENE GLYCOL 3350 17 G PO PACK
17.0000 g | PACK | Freq: Every day | ORAL | Status: DC
  Administered 2021-06-03: 17 g via ORAL
  Filled 2021-06-02: qty 1

## 2021-06-02 MED ORDER — DOCUSATE SODIUM 100 MG PO CAPS
100.0000 mg | ORAL_CAPSULE | Freq: Two times a day (BID) | ORAL | Status: DC
  Administered 2021-06-02 – 2021-06-03 (×2): 100 mg via ORAL
  Filled 2021-06-02 (×2): qty 1

## 2021-06-02 MED ORDER — SODIUM CHLORIDE 0.9 % IR SOLN
Status: DC | PRN
  Administered 2021-06-02: 1000 mL

## 2021-06-02 MED ORDER — FENTANYL CITRATE (PF) 100 MCG/2ML IJ SOLN
INTRAMUSCULAR | Status: DC | PRN
  Administered 2021-06-02: 25 ug via INTRAVENOUS

## 2021-06-02 MED ORDER — PROPOFOL 10 MG/ML IV BOLUS
INTRAVENOUS | Status: DC | PRN
  Administered 2021-06-02: 30 mg via INTRAVENOUS
  Administered 2021-06-02: 50 mg via INTRAVENOUS

## 2021-06-02 MED ORDER — ACETAMINOPHEN 325 MG PO TABS: 325.0000 mg | ORAL_TABLET | Freq: Four times a day (QID) | ORAL | Status: DC | PRN

## 2021-06-02 MED ORDER — TRANEXAMIC ACID-NACL 1000-0.7 MG/100ML-% IV SOLN
1000.0000 mg | Freq: Once | INTRAVENOUS | Status: AC
  Administered 2021-06-02: 1000 mg via INTRAVENOUS
  Filled 2021-06-02: qty 100

## 2021-06-02 MED ORDER — ONDANSETRON HCL 4 MG/2ML IJ SOLN
INTRAMUSCULAR | Status: DC | PRN
  Administered 2021-06-02: 4 mg via INTRAVENOUS

## 2021-06-02 MED ORDER — CEFAZOLIN SODIUM-DEXTROSE 2-4 GM/100ML-% IV SOLN
2.0000 g | INTRAVENOUS | Status: AC
  Administered 2021-06-02: 2 g via INTRAVENOUS
  Filled 2021-06-02: qty 100

## 2021-06-02 MED ORDER — CEFAZOLIN SODIUM-DEXTROSE 2-4 GM/100ML-% IV SOLN
2.0000 g | Freq: Four times a day (QID) | INTRAVENOUS | Status: AC
  Administered 2021-06-02 (×2): 2 g via INTRAVENOUS
  Filled 2021-06-02 (×2): qty 100

## 2021-06-02 MED ORDER — ALUM & MAG HYDROXIDE-SIMETH 200-200-20 MG/5ML PO SUSP: 30.0000 mL | ORAL | Status: DC | PRN

## 2021-06-02 MED ORDER — ONDANSETRON HCL 4 MG/2ML IJ SOLN
4.0000 mg | Freq: Four times a day (QID) | INTRAMUSCULAR | Status: DC | PRN
  Administered 2021-06-02: 4 mg via INTRAVENOUS
  Filled 2021-06-02: qty 2

## 2021-06-02 MED ORDER — LOSARTAN POTASSIUM 50 MG PO TABS
100.0000 mg | ORAL_TABLET | Freq: Every day | ORAL | Status: DC
  Administered 2021-06-02 – 2021-06-03 (×2): 100 mg via ORAL
  Filled 2021-06-02 (×2): qty 2

## 2021-06-02 MED ORDER — DEXAMETHASONE SODIUM PHOSPHATE 10 MG/ML IJ SOLN: 10.0000 mg | Freq: Once | INTRAMUSCULAR | Status: DC

## 2021-06-02 MED ORDER — HYDROMORPHONE HCL 1 MG/ML IJ SOLN
0.5000 mg | INTRAMUSCULAR | Status: DC | PRN
  Administered 2021-06-02 (×2): 1 mg via INTRAVENOUS
  Filled 2021-06-02 (×2): qty 1

## 2021-06-02 MED ORDER — PHENYLEPHRINE HCL-NACL 20-0.9 MG/250ML-% IV SOLN
INTRAVENOUS | Status: DC | PRN
  Administered 2021-06-02: 20 ug/min via INTRAVENOUS

## 2021-06-02 MED ORDER — MENTHOL 3 MG MT LOZG: 1.0000 | LOZENGE | OROMUCOSAL | Status: DC | PRN

## 2021-06-02 MED ORDER — AMISULPRIDE (ANTIEMETIC) 5 MG/2ML IV SOLN: 10.0000 mg | Freq: Once | INTRAVENOUS | Status: DC | PRN

## 2021-06-02 MED ORDER — LOSARTAN POTASSIUM-HCTZ 100-12.5 MG PO TABS: 1.0000 | ORAL_TABLET | Freq: Every day | ORAL | Status: DC

## 2021-06-02 MED ORDER — IRRISEPT - 450ML BOTTLE WITH 0.05% CHG IN STERILE WATER, USP 99.95% OPTIME
TOPICAL | Status: DC | PRN
  Administered 2021-06-02: 450 mL via TOPICAL

## 2021-06-02 MED ORDER — ONDANSETRON HCL 4 MG PO TABS
4.0000 mg | ORAL_TABLET | Freq: Four times a day (QID) | ORAL | Status: DC | PRN
  Administered 2021-06-03: 4 mg via ORAL
  Filled 2021-06-02: qty 1

## 2021-06-02 MED ORDER — SORBITOL 70 % SOLN: 30.0000 mL | Freq: Every day | Status: DC | PRN

## 2021-06-02 MED ORDER — ONDANSETRON HCL 4 MG/2ML IJ SOLN: 4.0000 mg | Freq: Once | INTRAMUSCULAR | Status: DC | PRN

## 2021-06-02 MED ORDER — SODIUM CHLORIDE 0.9 % IV SOLN: INTRAVENOUS | Status: DC

## 2021-06-02 MED ORDER — HYDROCHLOROTHIAZIDE 12.5 MG PO TABS
12.5000 mg | ORAL_TABLET | Freq: Every day | ORAL | Status: DC
  Administered 2021-06-02 – 2021-06-03 (×2): 12.5 mg via ORAL
  Filled 2021-06-02 (×2): qty 1

## 2021-06-02 MED ORDER — CHLORHEXIDINE GLUCONATE 0.12 % MT SOLN
15.0000 mL | Freq: Once | OROMUCOSAL | Status: AC
  Administered 2021-06-02: 15 mL via OROMUCOSAL
  Filled 2021-06-02: qty 15

## 2021-06-02 MED ORDER — OXYCODONE HCL 5 MG PO TABS
5.0000 mg | ORAL_TABLET | ORAL | Status: DC | PRN
  Administered 2021-06-02 – 2021-06-03 (×2): 10 mg via ORAL
  Filled 2021-06-02 (×2): qty 2

## 2021-06-02 MED ORDER — TRANEXAMIC ACID-NACL 1000-0.7 MG/100ML-% IV SOLN
1000.0000 mg | INTRAVENOUS | Status: AC
  Administered 2021-06-02: 1000 mg via INTRAVENOUS

## 2021-06-02 MED ORDER — 0.9 % SODIUM CHLORIDE (POUR BTL) OPTIME
TOPICAL | Status: DC | PRN
  Administered 2021-06-02: 1000 mL

## 2021-06-02 MED ORDER — ORAL CARE MOUTH RINSE: 15.0000 mL | Freq: Once | OROMUCOSAL | Status: AC

## 2021-06-02 MED ORDER — PANTOPRAZOLE SODIUM 40 MG PO TBEC
40.0000 mg | DELAYED_RELEASE_TABLET | Freq: Every day | ORAL | Status: DC
  Administered 2021-06-03: 40 mg via ORAL
  Filled 2021-06-02: qty 1

## 2021-06-02 MED ORDER — BUPIVACAINE-MELOXICAM ER 400-12 MG/14ML IJ SOLN
INTRAMUSCULAR | Status: DC | PRN
  Administered 2021-06-02: 400 mg

## 2021-06-02 MED ORDER — DIPHENHYDRAMINE HCL 12.5 MG/5ML PO ELIX
25.0000 mg | ORAL_SOLUTION | ORAL | Status: DC | PRN
  Filled 2021-06-02: qty 10

## 2021-06-02 MED ORDER — MEPERIDINE HCL 25 MG/ML IJ SOLN
INTRAMUSCULAR | Status: AC
  Filled 2021-06-02: qty 1

## 2021-06-02 MED ORDER — TRANEXAMIC ACID-NACL 1000-0.7 MG/100ML-% IV SOLN
INTRAVENOUS | Status: AC
  Filled 2021-06-02: qty 100

## 2021-06-02 MED ORDER — MIDAZOLAM HCL 2 MG/2ML IJ SOLN
INTRAMUSCULAR | Status: AC
  Filled 2021-06-02: qty 2

## 2021-06-02 MED ORDER — METOCLOPRAMIDE HCL 5 MG/ML IJ SOLN
5.0000 mg | Freq: Three times a day (TID) | INTRAMUSCULAR | Status: DC | PRN
  Administered 2021-06-03: 10 mg via INTRAVENOUS
  Filled 2021-06-02: qty 2

## 2021-06-02 MED ORDER — VANCOMYCIN HCL 1 G IV SOLR
INTRAVENOUS | Status: DC | PRN
  Administered 2021-06-02: 1000 mg via TOPICAL

## 2021-06-02 MED ORDER — BUPIVACAINE-MELOXICAM ER 400-12 MG/14ML IJ SOLN
INTRAMUSCULAR | Status: AC
  Filled 2021-06-02: qty 1

## 2021-06-02 MED ORDER — OXYCODONE HCL 5 MG PO TABS
10.0000 mg | ORAL_TABLET | ORAL | Status: DC | PRN
  Administered 2021-06-03: 10 mg via ORAL
  Administered 2021-06-03: 15 mg via ORAL
  Filled 2021-06-02: qty 3
  Filled 2021-06-02: qty 2

## 2021-06-02 MED ORDER — FENTANYL CITRATE (PF) 100 MCG/2ML IJ SOLN: 25.0000 ug | INTRAMUSCULAR | Status: DC | PRN

## 2021-06-02 MED ORDER — EPHEDRINE SULFATE-NACL 50-0.9 MG/10ML-% IV SOSY
PREFILLED_SYRINGE | INTRAVENOUS | Status: DC | PRN
  Administered 2021-06-02 (×2): 5 mg via INTRAVENOUS

## 2021-06-02 SURGICAL SUPPLY — 62 items
BAG COUNTER SPONGE SURGICOUNT (BAG) ×2 IMPLANT
BAG DECANTER FOR FLEXI CONT (MISCELLANEOUS) ×2 IMPLANT
CELLS DAT CNTRL 66122 CELL SVR (MISCELLANEOUS) IMPLANT
COVER PERINEAL POST (MISCELLANEOUS) ×2 IMPLANT
COVER SURGICAL LIGHT HANDLE (MISCELLANEOUS) ×2 IMPLANT
DRAPE C-ARM 42X72 X-RAY (DRAPES) ×2 IMPLANT
DRAPE POUCH INSTRU U-SHP 10X18 (DRAPES) ×2 IMPLANT
DRAPE STERI IOBAN 125X83 (DRAPES) ×2 IMPLANT
DRAPE U-SHAPE 47X51 STRL (DRAPES) ×4 IMPLANT
DRESSING PEEL AND PLC PRVNA 13 (GAUZE/BANDAGES/DRESSINGS) ×2 IMPLANT
DRSG AQUACEL AG ADV 3.5X10 (GAUZE/BANDAGES/DRESSINGS) ×2 IMPLANT
DRSG PEEL AND PLACE PREVENA 13 (GAUZE/BANDAGES/DRESSINGS) ×4
DURAPREP 26ML APPLICATOR (WOUND CARE) ×4 IMPLANT
ELECT BLADE 4.0 EZ CLEAN MEGAD (MISCELLANEOUS) ×2
ELECT REM PT RETURN 9FT ADLT (ELECTROSURGICAL) ×2
ELECTRODE BLDE 4.0 EZ CLN MEGD (MISCELLANEOUS) ×1 IMPLANT
ELECTRODE REM PT RTRN 9FT ADLT (ELECTROSURGICAL) ×1 IMPLANT
GLOVE SURG LTX SZ7 (GLOVE) ×4 IMPLANT
GLOVE SURG SYN 7.5  E (GLOVE) ×8
GLOVE SURG SYN 7.5 E (GLOVE) ×4 IMPLANT
GLOVE SURG UNDER POLY LF SZ7 (GLOVE) ×40 IMPLANT
GLOVE SURG UNDER POLY LF SZ7.5 (GLOVE) ×4 IMPLANT
GOWN STRL REIN XL XLG (GOWN DISPOSABLE) ×2 IMPLANT
GOWN STRL REUS W/ TWL LRG LVL3 (GOWN DISPOSABLE) IMPLANT
GOWN STRL REUS W/ TWL XL LVL3 (GOWN DISPOSABLE) ×1 IMPLANT
GOWN STRL REUS W/TWL LRG LVL3 (GOWN DISPOSABLE)
GOWN STRL REUS W/TWL XL LVL3 (GOWN DISPOSABLE) ×2
HANDPIECE INTERPULSE COAX TIP (DISPOSABLE) ×2
HEAD CERAMIC DELTA 36 PLUS 1.5 (Hips) ×2 IMPLANT
HOOD PEEL AWAY FLYTE STAYCOOL (MISCELLANEOUS) ×4 IMPLANT
IV NS IRRIG 3000ML ARTHROMATIC (IV SOLUTION) ×2 IMPLANT
JET LAVAGE IRRISEPT WOUND (IRRIGATION / IRRIGATOR) ×2
KIT BASIN OR (CUSTOM PROCEDURE TRAY) ×2 IMPLANT
LAVAGE JET IRRISEPT WOUND (IRRIGATION / IRRIGATOR) ×1 IMPLANT
LINER NEUTRAL 52X36MM PLUS 4 (Liner) ×2 IMPLANT
MARKER SKIN DUAL TIP RULER LAB (MISCELLANEOUS) ×2 IMPLANT
NEEDLE SPNL 18GX3.5 QUINCKE PK (NEEDLE) ×2 IMPLANT
PACK TOTAL JOINT (CUSTOM PROCEDURE TRAY) ×2 IMPLANT
PACK UNIVERSAL I (CUSTOM PROCEDURE TRAY) ×2 IMPLANT
PIN SECTOR W/GRIP ACE CUP 52MM (Hips) ×2 IMPLANT
RTRCTR WOUND ALEXIS 18CM MED (MISCELLANEOUS)
SAW OSC TIP CART 19.5X105X1.3 (SAW) ×2 IMPLANT
SCREW 6.5MMX25MM (Screw) ×2 IMPLANT
SET HNDPC FAN SPRY TIP SCT (DISPOSABLE) ×1 IMPLANT
STAPLER VISISTAT 35W (STAPLE) IMPLANT
STEM FEM ACTIS STD SZ7 (Nail) ×2 IMPLANT
SUT ETHIBOND 2 V 37 (SUTURE) ×2 IMPLANT
SUT ETHILON 2 0 FS 18 (SUTURE) ×6 IMPLANT
SUT VIC AB 0 CT1 27 (SUTURE) ×2
SUT VIC AB 0 CT1 27XBRD ANBCTR (SUTURE) ×1 IMPLANT
SUT VIC AB 0 CT1 36 (SUTURE) ×2 IMPLANT
SUT VIC AB 1 CTX 36 (SUTURE) ×2
SUT VIC AB 1 CTX36XBRD ANBCTR (SUTURE) ×1 IMPLANT
SUT VIC AB 2-0 CT1 27 (SUTURE) ×8
SUT VIC AB 2-0 CT1 36 (SUTURE) ×2 IMPLANT
SUT VIC AB 2-0 CT1 TAPERPNT 27 (SUTURE) ×4 IMPLANT
SYR 50ML LL SCALE MARK (SYRINGE) ×2 IMPLANT
TOWEL GREEN STERILE (TOWEL DISPOSABLE) ×2 IMPLANT
TRAY CATH 16FR W/PLASTIC CATH (SET/KITS/TRAYS/PACK) IMPLANT
TRAY FOLEY W/BAG SLVR 16FR (SET/KITS/TRAYS/PACK) ×2
TRAY FOLEY W/BAG SLVR 16FR ST (SET/KITS/TRAYS/PACK) ×1 IMPLANT
YANKAUER SUCT BULB TIP NO VENT (SUCTIONS) ×2 IMPLANT

## 2021-06-02 NOTE — Anesthesia Procedure Notes (Signed)
Spinal  Patient location during procedure: OR Start time: 06/02/2021 7:20 AM End time: 06/02/2021 7:26 AM Reason for block: surgical anesthesia Staffing Performed: anesthesiologist  Anesthesiologist: Murvin Natal, MD Preanesthetic Checklist Completed: patient identified, IV checked, risks and benefits discussed, surgical consent, monitors and equipment checked, pre-op evaluation and timeout performed Spinal Block Patient position: sitting Prep: DuraPrep Patient monitoring: cardiac monitor, continuous pulse ox and blood pressure Approach: left paramedian Location: L4-5 Injection technique: single-shot Needle Needle type: Pencan  Needle gauge: 24 G Needle length: 9 cm Assessment Sensory level: T10 Events: CSF return Additional Notes Functioning IV was confirmed and monitors were applied. Sterile prep and drape, including hand hygiene and sterile gloves were used. The patient was positioned and the spine was prepped. The skin was anesthetized with lidocaine.  Free flow of clear CSF was obtained on the second attempt prior to injecting local anesthetic into the CSF.  The spinal needle aspirated freely following injection.  The needle was carefully withdrawn.  The patient tolerated the procedure well.
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
## 2021-06-02 NOTE — Anesthesia Postprocedure Evaluation (Signed)
Anesthesia Post Note  Patient: Kelly Wilson  Procedure(s) Performed: LEFT TOTAL HIP ARTHROPLASTY ANTERIOR APPROACH (Left: Hip)     Patient location during evaluation: PACU Anesthesia Type: Spinal Level of consciousness: awake Pain management: pain level controlled Vital Signs Assessment: post-procedure vital signs reviewed and stable Respiratory status: spontaneous breathing, respiratory function stable and patient connected to nasal cannula oxygen Cardiovascular status: blood pressure returned to baseline and stable Postop Assessment: no headache, no backache and no apparent nausea or vomiting Anesthetic complications: no   No notable events documented.  Last Vitals:  Vitals:   06/02/21 1156 06/02/21 1558  BP: (!) 145/95 (!) 117/54  Pulse: 70 77  Resp: 20 18  Temp:  36.9 C  SpO2: 100% 98%    Last Pain:  Vitals:   06/02/21 1759  TempSrc:   PainSc: 8                   P 

## 2021-06-02 NOTE — Op Note (Addendum)
LEFT TOTAL HIP ARTHROPLASTY ANTERIOR APPROACH  Procedure Note Kelly Wilson   [id number]  Pre-op Diagnosis: left hip degernerative joint disease     Post-op Diagnosis: same   Operative Procedures  1. Total hip replacement; Left hip; uncemented [id number]  2.  Application of incisional VAC to left hip incision.   Surgeon: Frankey Shown, M.D.  Assist: Madalyn Rob, PA-C   Anesthesia: spinal, local  Prosthesis: Depuy Acetabulum: Pinnacle 52 mm Femur: Actis 7 STD Head: 36 mm size: +1.5 Liner: +4 Bearing Type: ceramic/poly  Total Hip Arthroplasty (Anterior Approach) Op Note:  After informed consent was obtained and the operative extremity marked in the holding area, the patient was brought back to the operating room and placed supine on the HANA table. Next, the operative extremity was prepped and draped in normal sterile fashion. Surgical timeout occurred verifying patient identification, surgical site, surgical procedure and administration of antibiotics.  A modified anterior Smith-Peterson approach to the hip was performed, using the interval between tensor fascia lata and sartorius.  Dissection was carried bluntly down onto the anterior hip capsule. The lateral femoral circumflex vessels were identified and coagulated. A capsulotomy was performed and the capsular flaps tagged for later repair.  The neck osteotomy was performed. The femoral head was removed which showed severe wear, the acetabular rim was cleared of soft tissue and attention was turned to reaming the acetabulum.  Sequential reaming was performed under fluoroscopic guidance. We reamed to a size 51 mm, and then impacted the acetabular shell. A 25 mm cancellous screw was placed through the shell for added fixation.  The liner was then placed after irrigation and attention turned to the femur.  After placing the femoral hook, the leg was taken to externally rotated, extended and adducted position taking care to  perform soft tissue releases to allow for adequate mobilization of the femur. Soft tissue was cleared from the shoulder of the greater trochanter and the hook elevator used to improve exposure of the proximal femur. Sequential broaching performed up to a size 7. Trial neck and head were placed. The leg was brought back up to neutral and the construct reduced.  Antibiotic irrigation was placed in the surgical wound and kept for at least 1 minute.  The position and sizing of components, offset and leg lengths were checked using fluoroscopy. Stability of the construct was checked in extension and external rotation without any subluxation or impingement of prosthesis. We dislocated the prosthesis, dropped the leg back into position, removed trial components, and irrigated copiously. The final stem and head was then placed, the leg brought back up, the system reduced and fluoroscopy used to verify positioning.  We irrigated, obtained hemostasis and closed the capsule using #2 ethibond suture.  One gram of vancomycin powder was placed in the surgical bed.   One gram of topical tranexamic acid was injected into the joint.  The fascia was closed with #1 vicryl plus, the deep fat layer was closed with 0 vicryl, the subcutaneous layers closed with 2.0 Vicryl Plus and the skin closed with 2.0 nylon and incisional VAC due to pannus overhanging the surgical site. A sterile dressing was applied. The patient was awakened in the operating room and taken to recovery in stable condition.  All sponge, needle, and instrument counts were correct at the end of the case.   Tawanna Cooler, my PA, was a medical necessity for opening, closing, limb positioning, retracting, exposing, and overall facilitation and timely completion of the surgery.  Position: supine  Complications: see description of procedure.  Time Out: performed   Drains/Packing: none  Estimated blood loss: see anesthesia record  Returned to Recovery Room: in  good condition.   Antibiotics: yes   Mechanical VTE (DVT) Prophylaxis: sequential compression devices, TED thigh-high  Chemical VTE (DVT) Prophylaxis: aspirin   Fluid Replacement: see anesthesia record  Specimens Removed: 1 to pathology   Sponge and Instrument Count Correct? yes   PACU: portable radiograph - low AP   Plan/RTC: Return in 2 weeks for staple removal. Weight Bearing/Load Lower Extremity: full  Hip precautions: none Suture Removal: 2 weeks   N. Eduard Roux, MD [facility] 8:43 AM   Implant Name Type Inv. Item Serial No. Manufacturer Lot No. LRB No. Used Action  LINER NEUTRAL [id number] PLUS 4 - [id number] Liner LINER NEUTRAL 52X36MM PLUS 4  [employer] [id number] Left 1 Implanted  PIN SECTOR W/GRIP ACE CUP 52MM - [id number] Hips PIN SECTOR W/GRIP ACE CUP 52MM  [employer] [id number] Left 1 Implanted  SCREW 6.5MMX25MM - [id number] Screw SCREW 6.5MMX25MM  [employer] [id number] Left 1 Implanted  HEAD CERAMIC DELTA 36 PLUS 1.5 - [id number] Hips HEAD CERAMIC DELTA 36 PLUS 1.5  [employer] [id number] Left 1 Implanted  STEM FEM ACTIS STD SZ7 - [id number] Nail STEM FEM ACTIS STD SZ7  [employer] [id number] Left 1 Implanted

## 2021-06-02 NOTE — Care Plan (Signed)
Ortho Bundle Case Management Note  Patient Details  Name: Kelly Wilson MRN: [id number] Date of Birth: 29-Mar-1956  [facility] call to patient prior to surgery to discuss her Left total hip arthroplasty with Dr. Erlinda Hong. She is an Ortho bundle patient through [facility] and is agreeable to case management. She lives alone, but has a daughter who will be coming to assist her after surgery. She has a RW and 3in1/BSC and needs no other DME. Anticipate HHPT will be needed after a short hospital stay.Referral made to [facility] prior to surgery after choice provided. Reviewed all post op care instructions. Will continue to follow for needs                  DME Arranged:   (Patient reports she has a RW and BSC/3in1 at home already.) DME Agency:     [place]:  [place]:  [place]  Additional Comments: Please contact me with any questions of if this plan should need to change.  Jamse Arn, RN, BSN, [employer]  [phone] 06/02/2021, 3:49 PM

## 2021-06-02 NOTE — Evaluation (Signed)
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  PMHx, goiter, HTN, OA, myalgia, spinal stenosis, stroke/TIA, craniotomy for aneurysm  Clinical Impression  Pt admitted with/for elective L THA.  Pt a min guard in general to mod assist for bed mobility.  Pt currently limited functionally due to the problems listed below.  (see problems list.)  Pt will benefit from PT to maximize function and safety to be able to get home safely with available assist.        Recommendations for follow up therapy are one component of a multi-disciplinary discharge planning process, led by the attending physician.  Recommendations may be updated based on patient status, additional functional criteria and insurance authorization.  Follow Up Recommendations Follow physician's recommendations for discharge plan and follow up therapies    Assistance Recommended at Discharge Intermittent Supervision/Assistance (family to give up to 24/7 for a week)  Functional Status Assessment Patient has had a recent decline in their functional status and demonstrates the ability to make significant improvements in function in a reasonable and predictable amount of time.  Equipment Recommendations  None recommended by PT    Recommendations for Other Services       Precautions / Restrictions Precautions Precautions: Fall Restrictions LLE Weight Bearing: Weight bearing as tolerated      Mobility  Bed Mobility Overal bed mobility: Needs Assistance Bed Mobility: Supine to Sit;Sit to Supine     Supine to sit: Mod assist Sit to supine: Mod assist   General bed mobility comments: moderate assist due to pain with bridging and moving L LE off the bed.  Expect quick improvement    Transfers Overall transfer  level: Needs assistance   Transfers: Sit to/from Stand Sit to Stand: Min guard           General transfer comment: cues for hand placement    Ambulation/Gait Ambulation/Gait assistance: Min guard Gait Distance (Feet): 20 Feet Assistive device: Rolling walker (2 wheels) Gait Pattern/deviations: Step-to pattern   Gait velocity interpretation: <1.8 ft/sec, indicate of risk for recurrent falls   General Gait Details: mildly antalgic gait with cues for sequencing, but no assist.  Stairs            Wheelchair Mobility    Modified Rankin (Stroke Patients Only)       Balance                                             Pertinent Vitals/Pain Pain Assessment: Faces Faces Pain Scale: Hurts even more Pain Location: L hip/back Pain Descriptors / Indicators: Aching;Discomfort;Grimacing;Guarding Pain Intervention(s): Monitored during session    Home Living Family/patient expects to be discharged to:: Private residence Living Arrangements: Alone Available Help at Discharge: Family;Available 24 hours/day;Available PRN/intermittently;Other (Comment) (between 2 dtr's, pt to have at least a week of 24/7 help, then PRN) Type of Home: House Home Access: Stairs to enter Entrance Stairs-Rails: Right;Left;None (none front steps) Entrance Stairs-Number of Steps: 1/4   Home Layout: One level Home Equipment: Conservation officer, nature (2 wheels);Cane - single point;BSC/3in1;Shower seat - built in;Wheelchair - Press photographer      Prior Function Prior Level of Function : Independent/Modified Independent  Hand Dominance        Extremity/Trunk Assessment   Upper Extremity Assessment Upper Extremity Assessment: Overall WFL for tasks assessed    Lower Extremity Assessment Lower Extremity Assessment: LLE deficits/detail LLE Deficits / Details: painful to move actively, AROM to 90/90 degrees hip/knee. LLE: Unable to fully assess due to  pain LLE Coordination: decreased fine motor    Cervical / Trunk Assessment Cervical / Trunk Assessment: Normal  Communication   Communication: No difficulties  Cognition Arousal/Alertness: Awake/alert Behavior During Therapy: WFL for tasks assessed/performed Overall Cognitive Status: Within Functional Limits for tasks assessed                                          General Comments      Exercises Other Exercises Other Exercises: walking each LE up into hooklying and back to full extension x 5 reps   Assessment/Plan    PT Assessment Patient needs continued PT services  PT Problem List Decreased activity tolerance;Decreased range of motion;Decreased mobility;Decreased knowledge of use of DME;Pain       PT Treatment Interventions DME instruction;Gait training;Stair training;Functional mobility training;Therapeutic activities;Patient/family education    PT Goals (Current goals can be found in the Care Plan section)  Acute Rehab PT Goals Patient Stated Goal: back independent PT Goal Formulation: With patient Time For Goal Achievement: 06/06/21 Potential to Achieve Goals: Good    Frequency Min 5X/week   Barriers to discharge        Co-evaluation               AM-PAC PT "6 Clicks" Mobility  Outcome Measure Help needed turning from your back to your side while in a flat bed without using bedrails?: A Lot Help needed moving from lying on your back to sitting on the side of a flat bed without using bedrails?: A Lot Help needed moving to and from a bed to a chair (including a wheelchair)?: A Little Help needed standing up from a chair using your arms (e.g., wheelchair or bedside chair)?: A Little     6 Click Score: 10    End of Session   Activity Tolerance: Patient tolerated treatment well;Patient limited by pain Patient left: in bed;with call bell/phone within reach Nurse Communication: Mobility status PT Visit Diagnosis: Other abnormalities  of gait and mobility (R26.89);Pain Pain - Right/Left: Left Pain - part of body: Hip    Time: 4825-0037 PT Time Calculation (min) (ACUTE ONLY): 39 min   Charges:   PT Evaluation $PT Eval Low Complexity: 1 Low PT Treatments $Gait Training: 8-22 mins $Therapeutic Activity: 8-22 mins        06/02/2021  Ginger Carne., PT Acute Rehabilitation Services [phone]  (pager) [phone]  (office)  Tessie Fass  06/02/2021, 4:39 PM

## 2021-06-02 NOTE — Transfer of Care (Signed)
Immediate Anesthesia Transfer of Care Note  Patient: MEKLIT COTTA  Procedure(s) Performed: LEFT TOTAL HIP ARTHROPLASTY ANTERIOR APPROACH (Left: Hip)  Patient Location: PACU  Anesthesia Type:Spinal  Level of Consciousness: awake  Airway & Oxygen Therapy: Patient Spontanous Breathing  Post-op Assessment: Report given to RN and Post -op Vital signs reviewed and stable  Post vital signs: Reviewed and stable  Last Vitals:  Vitals Value Taken Time  BP 112/55 06/02/21 0911  Temp    Pulse 66 06/02/21 0911  Resp 11 06/02/21 0911  SpO2 97 % 06/02/21 0911  Vitals shown include unvalidated device data.  Last Pain:  Vitals:   06/02/21 0618  TempSrc:   PainSc: 4       Patients Stated Pain Goal: 2 (74/93/55 2174)  Complications: No notable events documented.

## 2021-06-02 NOTE — Discharge Instructions (Signed)

## 2021-06-03 ENCOUNTER — Encounter (HOSPITAL_COMMUNITY): Payer: Self-pay | Admitting: Orthopaedic Surgery

## 2021-06-03 DIAGNOSIS — Z79899 Other long term (current) drug therapy: Secondary | ICD-10-CM | POA: Diagnosis not present

## 2021-06-03 DIAGNOSIS — I10 Essential (primary) hypertension: Secondary | ICD-10-CM | POA: Diagnosis not present

## 2021-06-03 DIAGNOSIS — Z20822 Contact with and (suspected) exposure to covid-19: Secondary | ICD-10-CM | POA: Diagnosis not present

## 2021-06-03 DIAGNOSIS — Z7982 Long term (current) use of aspirin: Secondary | ICD-10-CM | POA: Diagnosis not present

## 2021-06-03 DIAGNOSIS — M1612 Unilateral primary osteoarthritis, left hip: Secondary | ICD-10-CM | POA: Diagnosis not present

## 2021-06-03 LAB — CBC
HCT: 35 % — ABNORMAL LOW (ref 36.0–46.0)
Hemoglobin: 11.4 g/dL — ABNORMAL LOW (ref 12.0–15.0)
MCH: 28.6 pg (ref 26.0–34.0)
MCHC: 32.6 g/dL (ref 30.0–36.0)
MCV: 87.9 fL (ref 80.0–100.0)
Platelets: 260 10*3/uL (ref 150–400)
RBC: 3.98 MIL/uL (ref 3.87–5.11)
RDW: 13 % (ref 11.5–15.5)
WBC: 10.7 10*3/uL — ABNORMAL HIGH (ref 4.0–10.5)
nRBC: 0 % (ref 0.0–0.2)

## 2021-06-03 LAB — BASIC METABOLIC PANEL
Anion gap: 8 (ref 5–15)
BUN: 11 mg/dL (ref 8–23)
CO2: 26 mmol/L (ref 22–32)
Calcium: 8.4 mg/dL — ABNORMAL LOW (ref 8.9–10.3)
Chloride: 98 mmol/L (ref 98–111)
Creatinine, Ser: 0.75 mg/dL (ref 0.44–1.00)
GFR, Estimated: >60 mL/min (ref >60)
Glucose, Bld: 131 mg/dL — ABNORMAL HIGH (ref 70–99)
Potassium: 3.7 mmol/L (ref 3.5–5.1)
Sodium: 132 mmol/L — ABNORMAL LOW (ref 135–145)

## 2021-06-03 NOTE — Progress Notes (Signed)
Subjective: 1 Day Post-Op Procedure(s) (LRB): LEFT TOTAL HIP ARTHROPLASTY ANTERIOR APPROACH (Left) Patient reports pain as mild.  C/o nausea/vomiting yesterday when up with PT.  Patient thinks this was due to pain.  Feeling much  better this am.  Has not navigated hallways yet.    Objective: Vital signs in last 24 hours: Temp:  [97 F (36.1 C)-99.2 F (37.3 C)] 98.6 F (37 C) (11/15 0726) Pulse Rate:  [56-95] 88 (11/15 0726) Resp:  [10-27] 18 (11/15 0726) BP: (102-163)/(54-95) 132/89 (11/15 0726) SpO2:  [96 %-100 %] 97 % (11/15 0726)  Intake/Output from previous day: 11/14 0701 - 11/15 0700 In: 1570 [P.O.:570; I.V.:800; IV Piggyback:200] Out: 1325 [Urine:1150; Blood:175] Intake/Output this shift: No intake/output data recorded.  Recent Labs    06/03/21 0417  HGB 11.4*   Recent Labs    06/03/21 0417  WBC 10.7*  RBC 3.98  HCT 35.0*  PLT 260   Recent Labs    06/03/21 0417  NA 132*  K 3.7  CL 98  CO2 26  BUN 11  CREATININE 0.75  GLUCOSE 131*  CALCIUM 8.4*   No results for input(s): LABPT, INR in the last 72 hours.  Neurologically intact Neurovascular intact Sensation intact distally Intact pulses distally Dorsiflexion/Plantar flexion intact Incision: dressing C/D/I No cellulitis present Compartment soft Wound vac in place with good seal.  No fluid in canister   Assessment/Plan: 1 Day Post-Op Procedure(s) (LRB): LEFT TOTAL HIP ARTHROPLASTY ANTERIOR APPROACH (Left) Advance diet Up with therapy D/C IV fluids WBAT LLE ABLA- mild and stable D/c after second PT session as long as patient mobilizes.  Will not need hhpt if able to walk hallway.  Will only need exercise handout from PT. Ivac changed from home unit to prevena unit early am.  Spoke to nurse about this and reinforced that we do not like this to be changed until just prior to discharge      Aundra Dubin 06/03/2021, 7:30 AM

## 2021-06-03 NOTE — Progress Notes (Signed)
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  PMHx, goiter, HTN, OA, myalgia, spinal stenosis, stroke/TIA, craniotomy for aneurysm    PT Comments    Pt is progressing slowly towards her physical therapy goals; remains limited by constant "burning," pain and nausea. Performed gentle bed level AROM/AAROM exercises for strengthening (written handout provided). Also provided gait belt for her to improve ease initially of progressing LLE on and off the bed; pt reports this really helps. Pt ambulating 60 feet with a walker, utilizing a step to pattern. Will ideally need to be walking household distances and practice steps prior to discharge.     Recommendations for follow up therapy are one component of a multi-disciplinary discharge planning process, led by the attending physician.  Recommendations may be updated based on patient status, additional functional criteria and insurance authorization.  Follow Up Recommendations  Follow physician's recommendations for discharge plan and follow up therapies     Assistance Recommended at Discharge Intermittent Supervision/Assistance (family to give up to 24/7 for a week)  Equipment Recommendations  None recommended by PT    Recommendations for Other Services       Precautions / Restrictions Precautions Precautions: Fall Restrictions Weight Bearing Restrictions: No     Mobility  Bed Mobility Overal bed mobility: Needs Assistance Bed Mobility: Supine to Sit;Sit to Supine     Supine to sit: Min guard Sit to supine: Min assist   General bed mobility comments: Progressing to min guard assist for exiting bed towards right, use of gait belt strap to progress RLE off of bed. MinA for RLE management back into bed     Transfers Overall transfer level: Needs assistance   Transfers: Sit to/from Stand Sit to Stand: Min guard           General transfer comment: cues for hand placement    Ambulation/Gait Ambulation/Gait assistance: Min guard Gait Distance (Feet): 60 Feet Assistive device: Rolling walker (2 wheels) Gait Pattern/deviations: Step-to pattern;Antalgic;Trunk flexed;Decreased dorsiflexion - left Gait velocity: decreased     General Gait Details: cues for upright posture, sequencing, walker use. decreased L heel strike   Stairs             Wheelchair Mobility    Modified Rankin (Stroke Patients Only)       Balance Overall balance assessment: Needs assistance Sitting-balance support: Feet supported Sitting balance-Leahy Scale: Good     Standing balance support: Bilateral upper extremity supported Standing balance-Leahy Scale: Poor                              Cognition Arousal/Alertness: Awake/alert Behavior During Therapy: WFL for tasks assessed/performed Overall Cognitive Status: Within Functional Limits for tasks assessed                                          Exercises Total Joint Exercises Quad Sets: Left;15 reps;Supine Short Arc Quad: Left;AAROM;15 reps;Supine Hip ABduction/ADduction: AAROM;Left;5 reps;Supine Long Arc Quad: AAROM;Left;Other (comment) (3 reps)    General Comments        Pertinent Vitals/Pain Pain Assessment: Faces Faces Pain Scale: Hurts whole lot Pain Location: L hip/back  Pain Descriptors / Indicators: Aching;Discomfort;Grimacing;Guarding;Burning Pain Intervention(s): Limited activity within patient's tolerance;Monitored during session    Home Living                          Prior Function            PT Goals (current goals can now be found in the care plan section) Acute Rehab PT Goals Patient Stated Goal: back independent PT Goal Formulation: With patient Time For Goal  Achievement: 06/06/21 Potential to Achieve Goals: Good Progress towards PT goals: Progressing toward goals    Frequency    Min 5X/week      PT Plan Current plan remains appropriate    Co-evaluation              AM-PAC PT "6 Clicks" Mobility   Outcome Measure  Help needed turning from your back to your side while in a flat bed without using bedrails?: A Little Help needed moving from lying on your back to sitting on the side of a flat bed without using bedrails?: A Little Help needed moving to and from a bed to a chair (including a wheelchair)?: A Little Help needed standing up from a chair using your arms (e.g., wheelchair or bedside chair)?: A Little Help needed to walk in hospital room?: A Little Help needed climbing 3-5 steps with a railing? : A Lot 6 Click Score: 17    End of Session   Activity Tolerance: Patient tolerated treatment well;Patient limited by pain Patient left: in bed;with call bell/phone within reach Nurse Communication: Mobility status PT Visit Diagnosis: Other abnormalities of gait and mobility (R26.89);Pain Pain - Right/Left: Left Pain - part of body: Hip     Time: 1165-7903 PT Time Calculation (min) (ACUTE ONLY): 26 min  Charges:  $Gait Training: 8-22 mins $Therapeutic Exercise: 8-22 mins                     Wyona Almas, PT, DPT Acute Rehabilitation Services Pager [phone] Office [phone]    Deno Etienne 06/03/2021, 10:30 AM

## 2021-06-03 NOTE — Progress Notes (Signed)
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  PMHx, goiter, HTN, OA, myalgia, spinal stenosis, stroke/TIA, craniotomy for aneurysm    PT Comments    Pt reports improved nausea and pain, thus exhibiting increased activity tolerance this session. Pt ambulating 100 feet with a walker, utilizing a step to pattern. Negotiated 2 steps via multiple techniques to simulate both front and back entrance of home. Education reviewed regarding HEP and activity recommendations. Pt states she is ready to go home today. Will benefit from follow up PT for further gait training and LLE strengthening.     Recommendations for follow up therapy are one component of a multi-disciplinary discharge planning process, led by the attending physician.  Recommendations may be updated based on patient status, additional functional criteria and insurance authorization.  Follow Up Recommendations  Follow physician's recommendations for discharge plan and follow up therapies     Assistance Recommended at Discharge Intermittent Supervision/Assistance (family to give up to 24/7 for a week)  Equipment Recommendations  None recommended by PT    Recommendations for Other Services       Precautions / Restrictions Precautions Precautions: Fall Restrictions Weight Bearing Restrictions: No     Mobility  Bed Mobility Overal bed mobility: Needs Assistance Bed Mobility: Supine to Sit;Sit to Supine     Supine to sit: Supervision Sit to supine: Min guard   General bed mobility comments: Use of gait belt to progress RLE on and off bed    Transfers Overall transfer level: Needs assistance   Transfers: Sit to/from Stand Sit to Stand: Min guard           General transfer comment: cues for hand  placement    Ambulation/Gait Ambulation/Gait assistance: Min guard Gait Distance (Feet): 100 Feet Assistive device: Rolling walker (2 wheels) Gait Pattern/deviations: Step-to pattern;Antalgic;Trunk flexed;Decreased dorsiflexion - left Gait velocity: decreased     General Gait Details: cues for upright posture, sequencing, walker use, L heel strike at initial contact. utilizing step to pattern   Stairs Stairs: Yes Stairs assistance: Min guard Stair Management: One rail Left;Sideways;Backwards;Forwards;With walker Number of Stairs: 2 General stair comments: Pt negotiated 1 step with L railing and bilateral hand support sideways. Then negotiated 1 step with walker backwards ascending, forwards descending. Cues for technique and sequencing   Wheelchair Mobility    Modified Rankin (Stroke Patients Only)       Balance Overall balance assessment: Needs assistance Sitting-balance support: Feet supported Sitting balance-Leahy Scale: Good     Standing balance support: Bilateral upper extremity supported Standing balance-Leahy Scale: Poor                              Cognition Arousal/Alertness: Awake/alert Behavior During Therapy: WFL for tasks assessed/performed Overall Cognitive Status: Within Functional Limits for tasks assessed                                          Exercises      General Comments        Pertinent Vitals/Pain Pain Assessment: Faces Faces Pain Scale: Hurts whole lot Pain Location: L hip/back Pain Descriptors / Indicators: Aching;Discomfort;Grimacing;Guarding;Burning  Pain Intervention(s): Limited activity within patient's tolerance;Monitored during session;Premedicated before session    Home Living                          Prior Function            PT Goals (current goals can now be found in the care plan section) Acute Rehab PT Goals Patient Stated Goal: back independent PT Goal Formulation: With  patient Time For Goal Achievement: 06/06/21 Potential to Achieve Goals: Good Progress towards PT goals: Progressing toward goals    Frequency    Min 5X/week      PT Plan Current plan remains appropriate    Co-evaluation              AM-PAC PT "6 Clicks" Mobility   Outcome Measure  Help needed turning from your back to your side while in a flat bed without using bedrails?: None Help needed moving from lying on your back to sitting on the side of a flat bed without using bedrails?: A Little Help needed moving to and from a bed to a chair (including a wheelchair)?: A Little Help needed standing up from a chair using your arms (e.g., wheelchair or bedside chair)?: A Little Help needed to walk in hospital room?: A Little Help needed climbing 3-5 steps with a railing? : A Little 6 Click Score: 19    End of Session   Activity Tolerance: Patient tolerated treatment well;Patient limited by pain Patient left: in bed;with call bell/phone within reach Nurse Communication: Mobility status PT Visit Diagnosis: Other abnormalities of gait and mobility (R26.89);Pain Pain - Right/Left: Left Pain - part of body: Hip     Time: 1305-1330 PT Time Calculation (min) (ACUTE ONLY): 25 min  Charges:  $Gait Training: 23-37 mins                     Wyona Almas, PT, DPT Acute Rehabilitation Services Pager [phone] Office [phone]    Deno Etienne 06/03/2021, 2:45 PM

## 2021-06-03 NOTE — Progress Notes (Signed)
Patient awaiting transport to her vehicle via wheelchair for discharge home; in no acute distress nor complaints of pain nor discomfort; incision on her left hip with hydrocolloid dressing and is clean, dry and intact; room was checked and accounted for all her belongings; discharge instructions concerning medications, wound care, follow up appointment and when to call the doctor were all discussed with patient and her family and all expressed understanding on the instructions given.

## 2021-06-03 NOTE — Progress Notes (Deleted)
Patient awaiting transport to her vehicle via wheelchair for discharge home; in no acute distress nor complaints of pain nor discomfort; incision on her back with honeycomb dressing and is clean, dry and intact; room was checked and accounted for all her belongings; discharge instructions concerning medications, wound care, follow up appointment and when to call the doctor were all discussed with patient and her family and all expressed understanding on the instructions given.

## 2021-06-03 NOTE — Discharge Summary (Signed)
Patient ID: Kelly Wilson MRN: [id number] DOB/AGE: 12-25-1955 65 y.o.  Admit date: 06/02/2021 Discharge date: 06/03/2021  Admission Diagnoses:  Principal Problem:   Primary osteoarthritis of left hip Active Problems:   Status post total replacement of left hip   Discharge Diagnoses:  Same  Past Medical History:  Diagnosis Date   GERD 08/09/2008   GOITER, MULTINODULAR 05/28/2008   Hyperlipidemia    HYPERLIPIDEMIA 05/17/2007   Hypertension    HYPERTENSION 05/17/2007   Left ear hearing loss    Mild   Muscle cramps 11/14/2014   Muscle twitching    Myalgia 11/14/2014   OSTEOARTHRITIS 05/17/2007   Pancreatitis    SPINAL STENOSIS 05/17/2007   Stroke ([place])    TRANSIENT ISCHEMIC ATTACK, HX OF 05/17/2007    Surgeries: Procedure(s): LEFT TOTAL HIP ARTHROPLASTY ANTERIOR APPROACH on 06/02/2021   Consultants:   Discharged Condition: Improved  Hospital Course: LILU MCGLOWN is an 65 y.o. female who was admitted 06/02/2021 for operative treatment ofPrimary osteoarthritis of left hip. Patient has severe unremitting pain that affects sleep, daily activities, and work/hobbies. After pre-op clearance the patient was taken to the operating room on 06/02/2021 and underwent  Procedure(s): LEFT TOTAL HIP ARTHROPLASTY ANTERIOR APPROACH.    Patient was given perioperative antibiotics:  Anti-infectives (From admission, onward)    Start     Dose/Rate Route Frequency Ordered Stop   06/02/21 1330  ceFAZolin (ANCEF) IVPB 2g/100 mL premix        2 g 200 mL/hr over 30 Minutes Intravenous Every 6 hours 06/02/21 1157 06/02/21 2008   06/02/21 0724  vancomycin (VANCOCIN) powder  Status:  Discontinued          As needed 06/02/21 0725 06/02/21 0907   06/02/21 0600  ceFAZolin (ANCEF) IVPB 2g/100 mL premix        2 g 200 mL/hr over 30 Minutes Intravenous On call to O.R. 06/02/21 0550 06/02/21 0729        Patient was given sequential compression devices, early ambulation, and  chemoprophylaxis to prevent DVT.  Patient benefited maximally from hospital stay and there were no complications.    Recent vital signs: Patient Vitals for the past 24 hrs:  BP Temp Temp src Pulse Resp SpO2  06/03/21 0726 132/89 98.6 F (37 C) Oral 88 18 97 %  06/03/21 0400 (!) 163/75 99.2 F (37.3 C) Oral 95 18 96 %  06/02/21 2315 (!) 152/69 98.6 F (37 C) Oral 80 18 100 %  06/02/21 1933 (!) 151/68 98.8 F (37.1 C) Oral 78 18 100 %  06/02/21 1558 (!) 117/54 98.4 F (36.9 C) Oral 77 18 98 %  06/02/21 1156 (!) 145/95 -- -- 70 20 100 %  06/02/21 1135 (!) 150/75 (!) 97 F (36.1 C) -- 62 11 100 %  06/02/21 1125 137/60 -- -- 61 15 100 %  06/02/21 1115 (!) 143/60 -- -- 64 (!) 27 100 %  06/02/21 1110 -- -- -- 68 (!) 21 99 %  06/02/21 1055 (!) 143/60 -- -- 64 15 100 %  06/02/21 1040 136/67 -- -- 63 11 99 %  06/02/21 1025 135/65 -- -- (!) 56 13 99 %  06/02/21 1010 125/60 -- -- -- -- 98 %  06/02/21 1000 -- -- -- 65 16 100 %  06/02/21 0955 128/63 -- -- (!) 56 12 100 %  06/02/21 0940 102/77 -- -- 61 13 100 %  06/02/21 0925 (!) 110/59 -- -- 60 10 99 %  06/02/21 0915 -- -- --  63 15 100 %  06/02/21 0910 (!) 112/55 97.8 F (36.6 C) -- 66 11 97 %     Recent laboratory studies:  Recent Labs    06/03/21 0417  WBC 10.7*  HGB 11.4*  HCT 35.0*  PLT 260  NA 132*  K 3.7  CL 98  CO2 26  BUN 11  CREATININE 0.75  GLUCOSE 131*  CALCIUM 8.4*     Discharge Medications:   Allergies as of 06/03/2021       Reactions   Morphine Hives   Lipitor [atorvastatin Calcium]    Rosuvastatin Other (See Comments)   myalgia        Medication List     STOP taking these medications    Fish Oil 1000 MG Caps   HYDROcodone-acetaminophen 5-325 MG tablet Commonly known as: Norco   ibuprofen 200 MG tablet Commonly known as: ADVIL   omeprazole 40 MG capsule Commonly known as: PRILOSEC       TAKE these medications    alendronate 70 MG tablet Commonly known as: FOSAMAX TAKE 1  TABLET(70 MG) BY MOUTH EVERY 7 DAYS WITH A FULL GLASS OF WATER AND ON AN EMPTY STOMACH   aspirin EC 81 MG tablet Take 1 tablet (81 mg total) by mouth 2 (two) times daily. To be taken after surgery to prevent blood clots   CALCIUM 600/VITAMIN D PO Take 1 tablet by mouth daily.   celecoxib 200 MG capsule Commonly known as: CELEBREX Take 200 mg by mouth daily.   clonazePAM 0.5 MG tablet Commonly known as: KLONOPIN Take 1 tablet (0.5 mg total) by mouth 2 (two) times daily as needed for anxiety.   diphenhydrAMINE 50 MG tablet Commonly known as: BENADRYL Take 50 mg by mouth at bedtime.   docusate sodium 100 MG capsule Commonly known as: Colace Take 1 capsule (100 mg total) by mouth daily as needed.   losartan-hydrochlorothiazide 100-12.5 MG tablet Commonly known as: HYZAAR Take 1 tablet by mouth daily.   Magnesium 125 MG Caps Take 125 mg by mouth in the morning and at bedtime.   methocarbamol 500 MG tablet Commonly known as: Robaxin Take 1 tablet (500 mg total) by mouth 2 (two) times daily as needed.   omeprazole 20 MG tablet Commonly known as: PRILOSEC OTC Take 20 mg by mouth daily.   ondansetron 4 MG tablet Commonly known as: Zofran Take 1 tablet (4 mg total) by mouth every 8 (eight) hours as needed for nausea or vomiting.   oxyCODONE-acetaminophen 5-325 MG tablet Commonly known as: Percocet Take 1-2 tablets by mouth every 6 (six) hours as needed. To be taken after surgery   pravastatin 10 MG tablet Commonly known as: PRAVACHOL TAKE 1 TABLET(10 MG) BY MOUTH DAILY   vitamin C 250 MG tablet Commonly known as: ASCORBIC ACID Take 250 mg by mouth daily.   Vitamin D 50 MCG (2000 UT) tablet Take 2,000 Units by mouth daily.   zinc gluconate 50 MG tablet Take 50 mg by mouth daily.               Durable Medical Equipment  (From admission, onward)           Start     Ordered   06/02/21 1158  DME Walker rolling  Once       Question:  Patient needs a  walker to treat with the following condition  Answer:  History of hip replacement   06/02/21 1157   06/02/21 1158  DME 3 n  1  Once        06/02/21 1157   06/02/21 1158  DME Bedside commode  Once       Question:  Patient needs a bedside commode to treat with the following condition  Answer:  History of hip replacement   06/02/21 1157            Diagnostic Studies: DG Pelvis Portable  Result Date: 06/02/2021 CLINICAL DATA:  Status post left hip replacement EXAM: PORTABLE PELVIS 1 VIEWS COMPARISON:  None. FINDINGS: Interval postsurgical changes from left total hip arthroplasty. Arthroplasty components appear in their expected alignment. No periprosthetic fracture is identified. Expected postoperative changes within the overlying soft tissues. IMPRESSION: Expected postsurgical changes of left hip arthroplasty. Electronically Signed   By: Yetta Glassman M.D.   On: 06/02/2021 10:03   DG C-Arm 1-60 Min-No Report  Result Date: 06/02/2021 Fluoroscopy was utilized by the requesting physician.  No radiographic interpretation.   DG HIP OPERATIVE UNILAT WITH PELVIS LEFT  Result Date: 06/02/2021 CLINICAL DATA:  Intraoperative fluoroscopic images for left hip arthroplasty EXAM: OPERATIVE LEFT HIP (WITH PELVIS IF PERFORMED) 2 VIEWS TECHNIQUE: Fluoroscopic spot image(s) were submitted for interpretation post-operatively. COMPARISON:  None. FINDINGS: Status post left hip arthroplasty with intact hardware. No acute fracture. Total fluoroscopic time was 23 seconds. The total fluoroscopic dose was 1.61 mGy. IMPRESSION: Intraoperatively utilization of fluoroscopy Electronically Signed   By: Keane Police D.O.   On: 06/02/2021 09:53    Disposition: Discharge disposition: 01-Home or Self Care          Follow-up Information     Leandrew Koyanagi, MD. Go on 06/17/2021.   Specialty: Orthopedic Surgery Why: at 1:00 pm for your 2 week post op appointment with Dr. Sherilyn Cooter information: [place] [phone]                  Signed: Aundra Dubin 06/03/2021, 7:34 AM

## 2021-06-03 NOTE — Plan of Care (Signed)
  Problem: Safety: Goal: Ability to remain free from injury will improve Outcome: Completed/Met   Problem: Education: Goal: Knowledge of the prescribed therapeutic regimen will improve Outcome: Completed/Met Goal: Understanding of discharge needs will improve Outcome: Completed/Met Goal: Individualized Educational Video(s) Outcome: Completed/Met   Problem: Activity: Goal: Ability to avoid complications of mobility impairment will improve Outcome: Completed/Met Goal: Ability to tolerate increased activity will improve Outcome: Completed/Met   Problem: Clinical Measurements: Goal: Postoperative complications will be avoided or minimized Outcome: Completed/Met   Problem: Pain Management: Goal: Pain level will decrease with appropriate interventions Outcome: Completed/Met   Problem: Skin Integrity: Goal: Will show signs of wound healing Outcome: Completed/Met

## 2021-06-04 ENCOUNTER — Telehealth: Payer: Self-pay | Admitting: *Deleted

## 2021-06-04 NOTE — Telephone Encounter (Signed)
Ortho bundle D/C call completed. 

## 2021-06-05 ENCOUNTER — Telehealth: Payer: Self-pay

## 2021-06-05 NOTE — Telephone Encounter (Signed)
Sounds good. Just have her come in for nurse visit.

## 2021-06-05 NOTE — Telephone Encounter (Signed)
Patient states that she had surgery on Monday 06-02-2021 over night her pump cut off and she has been having trouble getting this to come back on. She states that she will contact the number placed on the phone however would like a call back.

## 2021-06-05 NOTE — Telephone Encounter (Signed)
Patient has made a second phone call about wound vac machine. She has contacted the number that was placed on the wound vac and she has not got a response from the either. She has attempted to shut machine off and turn it back on however continues to make beeping sound.

## 2021-06-05 NOTE — Telephone Encounter (Signed)
Offered patient to come in as a nurse visit. She states she would rather come in tomorrow. Having issues with wound VAC. Beeping, weird sound, not sure if its connected correctly.

## 2021-06-06 ENCOUNTER — Other Ambulatory Visit: Payer: Self-pay

## 2021-06-06 ENCOUNTER — Ambulatory Visit: Payer: Medicare HMO

## 2021-06-10 ENCOUNTER — Telehealth (INDEPENDENT_AMBULATORY_CARE_PROVIDER_SITE_OTHER): Payer: Medicare HMO | Admitting: Family Medicine

## 2021-06-10 ENCOUNTER — Encounter: Payer: Self-pay | Admitting: Family Medicine

## 2021-06-10 ENCOUNTER — Telehealth: Payer: Self-pay | Admitting: Family Medicine

## 2021-06-10 ENCOUNTER — Telehealth: Payer: Self-pay | Admitting: *Deleted

## 2021-06-10 ENCOUNTER — Telehealth: Payer: Self-pay | Admitting: Orthopaedic Surgery

## 2021-06-10 ENCOUNTER — Other Ambulatory Visit: Payer: Self-pay | Admitting: Physician Assistant

## 2021-06-10 DIAGNOSIS — J029 Acute pharyngitis, unspecified: Secondary | ICD-10-CM

## 2021-06-10 MED ORDER — OXYCODONE-ACETAMINOPHEN 5-325 MG PO TABS: 1.0000 | ORAL_TABLET | Freq: Three times a day (TID) | ORAL | 0 refills | Status: DC | PRN

## 2021-06-10 NOTE — Telephone Encounter (Signed)
Patient calling in with respiratory symptoms: Shortness of breath, chest pain, palpitations or other red words send to Triage  Does the patient have a fever over 100, cough, congestion, sore throat, runny nose, lost of taste/smell within the last 5 days (please list symptoms that patient has)?sore throat  Have you tested for Covid in the last 5 days? No   If yes, was it positive []  OR negative [] ? If positive in the last 5 days, please schedule virtual visit now. If negative, schedule for an in person OV with the next available provider if PCP has no openings. Please also let patient know they will be tested again (follow the script below)  "you will have to arrive 73mins prior to your appt time to be Covid tested. Please park in back of office at the cone & call [phone] to let the staff know you have arrived. A staff member will meet you at your car to do a rapid covid test. Once the test has resulted you will be notified by phone of your results to determine if appt will remain an in person visit or be converted to a virtual/phone visit. If you arrive less than 47mins before your appt time, your visit will be automatically converted to virtual & any recommended testing will happen AFTER the visit."  Pt has virtual appt with dr Maudie Mercury today at 515 pm THINGS TO REMEMBER  If no availability for virtual visit in office,  please schedule another [place] office  If no availability at another [place] office, please instruct patient that they can schedule an evisit or virtual visit through their mychart account. Visits up to 8pm  patients can be seen in office 5 days after positive COVID test

## 2021-06-10 NOTE — Patient Instructions (Signed)
-  salt water gargles  -stay hydrated  -do covid testing - if positive can contact a [facility] pharmacy or do a virtual visit if you desire antiviral treatment and it is in the first 5 days of symptoms  -can try running a humidifier at night and tylenol per instructions.  I hope you are feeling better soon!  Seek in person care promptly if your symptoms worsen, new concerns arise or you are not improving with treatment.  It was nice to meet you today. I help [facility] out with telemedicine visits on Tuesdays and Thursdays and am available for visits on those days. If you have any concerns or questions following this visit please schedule a follow up visit with your Primary Care doctor or seek care at a local urgent care clinic to avoid delays in care.

## 2021-06-10 NOTE — Telephone Encounter (Signed)
Ortho bundle 7 day call completed. 

## 2021-06-10 NOTE — Telephone Encounter (Signed)
Patient called needing Rx refilled Oxycodone 5-325 and Methocarbamol  500 mg.  The number to contact patient is [phone]
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
## 2021-06-10 NOTE — Telephone Encounter (Signed)
Sent in

## 2021-06-11 NOTE — Telephone Encounter (Signed)
Noted  

## 2021-06-17 ENCOUNTER — Other Ambulatory Visit: Payer: Self-pay

## 2021-06-17 ENCOUNTER — Telehealth: Payer: Self-pay

## 2021-06-17 ENCOUNTER — Telehealth: Payer: Self-pay | Admitting: *Deleted

## 2021-06-17 ENCOUNTER — Encounter: Payer: Self-pay | Admitting: Orthopaedic Surgery

## 2021-06-17 ENCOUNTER — Ambulatory Visit (INDEPENDENT_AMBULATORY_CARE_PROVIDER_SITE_OTHER): Payer: Medicare HMO | Admitting: Orthopaedic Surgery

## 2021-06-17 DIAGNOSIS — Z96642 Presence of left artificial hip joint: Secondary | ICD-10-CM
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
## 2021-06-17 NOTE — Telephone Encounter (Signed)
Ortho bundle 14 day in office meeting completed. °

## 2021-06-17 NOTE — Telephone Encounter (Signed)
Transition Care Management Follow-up Telephone Call Date of discharge and from where: 06/03/2021 [name] How have you been since you were released from the hospital? Doing fine Any questions or concerns? No  Items Reviewed: Did the pt receive and understand the discharge instructions provided? Yes  Medications obtained and verified? Yes   Patient not interested in talking about discharge that occurred 2 weeks ago. Reports she saw her Dr. Estil Daft  Tomasa Rand, RN, BSN, [facility] Coordinator [phone]

## 2021-06-23 ENCOUNTER — Telehealth: Payer: Self-pay | Admitting: Orthopaedic Surgery

## 2021-06-23 NOTE — Telephone Encounter (Signed)
Patient aware.

## 2021-06-23 NOTE — Telephone Encounter (Signed)
Pt called and wondering if she is able to,lift more than 10 pounds being 3 week post op.   Cb [phone]

## 2021-06-23 NOTE — Telephone Encounter (Signed)
She can lift up to 15 lbs if she can do it.

## 2021-06-25 ENCOUNTER — Other Ambulatory Visit: Payer: Self-pay | Admitting: Family Medicine

## 2021-07-06 ENCOUNTER — Other Ambulatory Visit: Payer: Self-pay | Admitting: Family Medicine

## 2021-07-08 ENCOUNTER — Telehealth: Payer: Self-pay | Admitting: *Deleted

## 2021-07-08 NOTE — Telephone Encounter (Signed)
Ortho bundle 30 day call attempted. No answer and left VM requesting call back. 

## 2021-07-16 ENCOUNTER — Encounter: Payer: Self-pay | Admitting: Orthopaedic Surgery

## 2021-07-16 ENCOUNTER — Ambulatory Visit (INDEPENDENT_AMBULATORY_CARE_PROVIDER_SITE_OTHER): Payer: Medicare HMO

## 2021-07-16 ENCOUNTER — Ambulatory Visit (INDEPENDENT_AMBULATORY_CARE_PROVIDER_SITE_OTHER): Payer: Medicare HMO | Admitting: Orthopaedic Surgery

## 2021-07-16 ENCOUNTER — Other Ambulatory Visit: Payer: Self-pay

## 2021-07-16 DIAGNOSIS — Z96642 Presence of left artificial hip joint: Secondary | ICD-10-CM
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
## 2021-07-29 ENCOUNTER — Telehealth: Payer: Self-pay | Admitting: Family Medicine

## 2021-07-29 NOTE — Telephone Encounter (Signed)
Spoke with patient to schedule Medicare Annual Wellness Visit (AWV) either virtually or in office.   Patient declined.  She stated she is fine and doesn't need appointment  Due AWV-I per [place] 08/21/19

## 2021-08-28 ENCOUNTER — Other Ambulatory Visit: Payer: Self-pay

## 2021-08-28 ENCOUNTER — Ambulatory Visit: Payer: Medicare HMO | Admitting: Physician Assistant

## 2021-08-28 DIAGNOSIS — Z96642 Presence of left artificial hip joint: Secondary | ICD-10-CM

## 2021-08-28 MED ORDER — AMOXICILLIN 500 MG PO CAPS: ORAL_CAPSULE | ORAL | 2 refills | Status: DC
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
## 2021-09-11 DIAGNOSIS — H2513 Age-related nuclear cataract, bilateral: Secondary | ICD-10-CM | POA: Diagnosis not present

## 2021-09-11 DIAGNOSIS — H524 Presbyopia: Secondary | ICD-10-CM | POA: Diagnosis not present

## 2021-09-12 DIAGNOSIS — H52223 Regular astigmatism, bilateral: Secondary | ICD-10-CM | POA: Diagnosis not present

## 2021-09-12 DIAGNOSIS — H524 Presbyopia: Secondary | ICD-10-CM | POA: Diagnosis not present

## 2021-10-02 DIAGNOSIS — L570 Actinic keratosis: Secondary | ICD-10-CM | POA: Diagnosis not present

## 2021-10-02 DIAGNOSIS — L720 Epidermal cyst: Secondary | ICD-10-CM | POA: Diagnosis not present

## 2021-10-02 DIAGNOSIS — M71341 Other bursal cyst, right hand: Secondary | ICD-10-CM | POA: Diagnosis not present

## 2021-10-31 ENCOUNTER — Other Ambulatory Visit: Payer: Self-pay | Admitting: Family Medicine

## 2021-11-12 ENCOUNTER — Telehealth (INDEPENDENT_AMBULATORY_CARE_PROVIDER_SITE_OTHER): Payer: Medicare HMO | Admitting: Internal Medicine

## 2021-11-12 ENCOUNTER — Telehealth: Payer: Self-pay | Admitting: Family Medicine

## 2021-11-12 ENCOUNTER — Encounter: Payer: Self-pay | Admitting: Internal Medicine

## 2021-11-12 DIAGNOSIS — U071 COVID-19: Secondary | ICD-10-CM | POA: Diagnosis not present

## 2021-11-12 MED ORDER — NIRMATRELVIR/RITONAVIR (PAXLOVID)TABLET: ORAL_TABLET | ORAL | 0 refills | Status: DC
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
## 2021-11-12 NOTE — Telephone Encounter (Signed)
Pt tested positive for covid yesterday afternoon. Inquiring about medication that can be administered in the early days to shorten the time ?

## 2021-11-25 ENCOUNTER — Ambulatory Visit: Payer: Medicare HMO | Admitting: Orthopaedic Surgery

## 2021-12-01 NOTE — Telephone Encounter (Signed)
err

## 2021-12-30 ENCOUNTER — Other Ambulatory Visit: Payer: Self-pay | Admitting: *Deleted

## 2021-12-30 MED ORDER — PRAVASTATIN SODIUM 10 MG PO TABS: ORAL_TABLET | ORAL | 1 refills | Status: DC

## 2022-01-07 ENCOUNTER — Other Ambulatory Visit: Payer: Self-pay | Admitting: *Deleted

## 2022-01-07 MED ORDER — LOSARTAN POTASSIUM-HCTZ 100-12.5 MG PO TABS: 1.0000 | ORAL_TABLET | Freq: Every day | ORAL | 0 refills | Status: DC

## 2022-01-11 DIAGNOSIS — G4733 Obstructive sleep apnea (adult) (pediatric): Secondary | ICD-10-CM | POA: Diagnosis not present

## 2022-02-12 DIAGNOSIS — G4733 Obstructive sleep apnea (adult) (pediatric): Secondary | ICD-10-CM | POA: Diagnosis not present

## 2022-04-13 ENCOUNTER — Other Ambulatory Visit: Payer: Self-pay | Admitting: Family

## 2022-04-24 IMAGING — MG DIGITAL SCREENING BILAT W/ CAD
4 series · 4 of 4 positions shown · non-contrast
Comparison: Previous exam(s).

CLINICAL DATA: Screening.

EXAM:
DIGITAL SCREENING BILATERAL MAMMOGRAM WITH CAD

[R MLO]
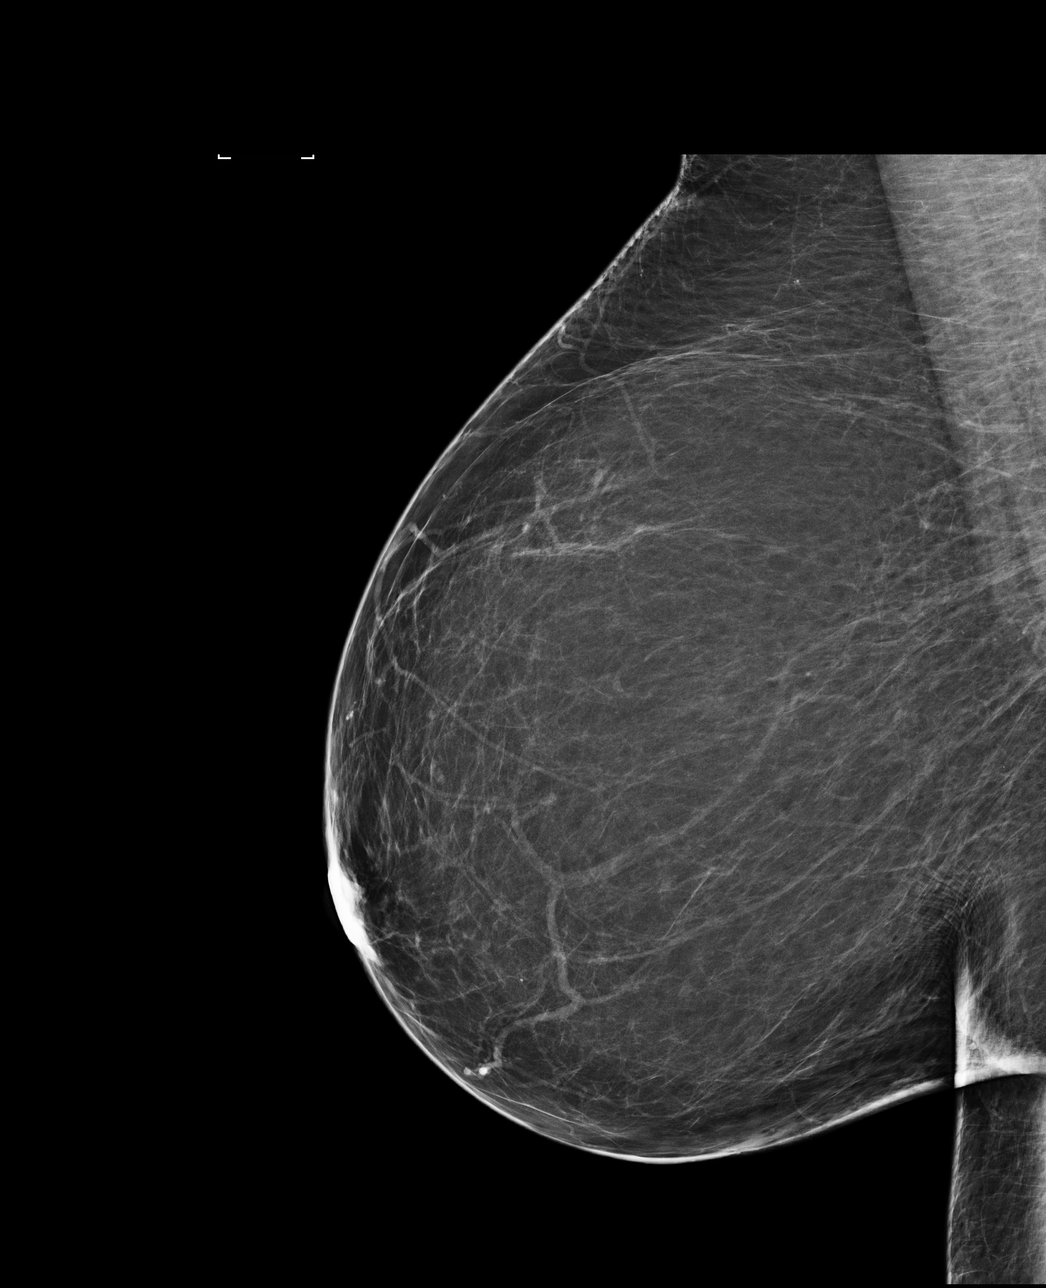

[L MLO]
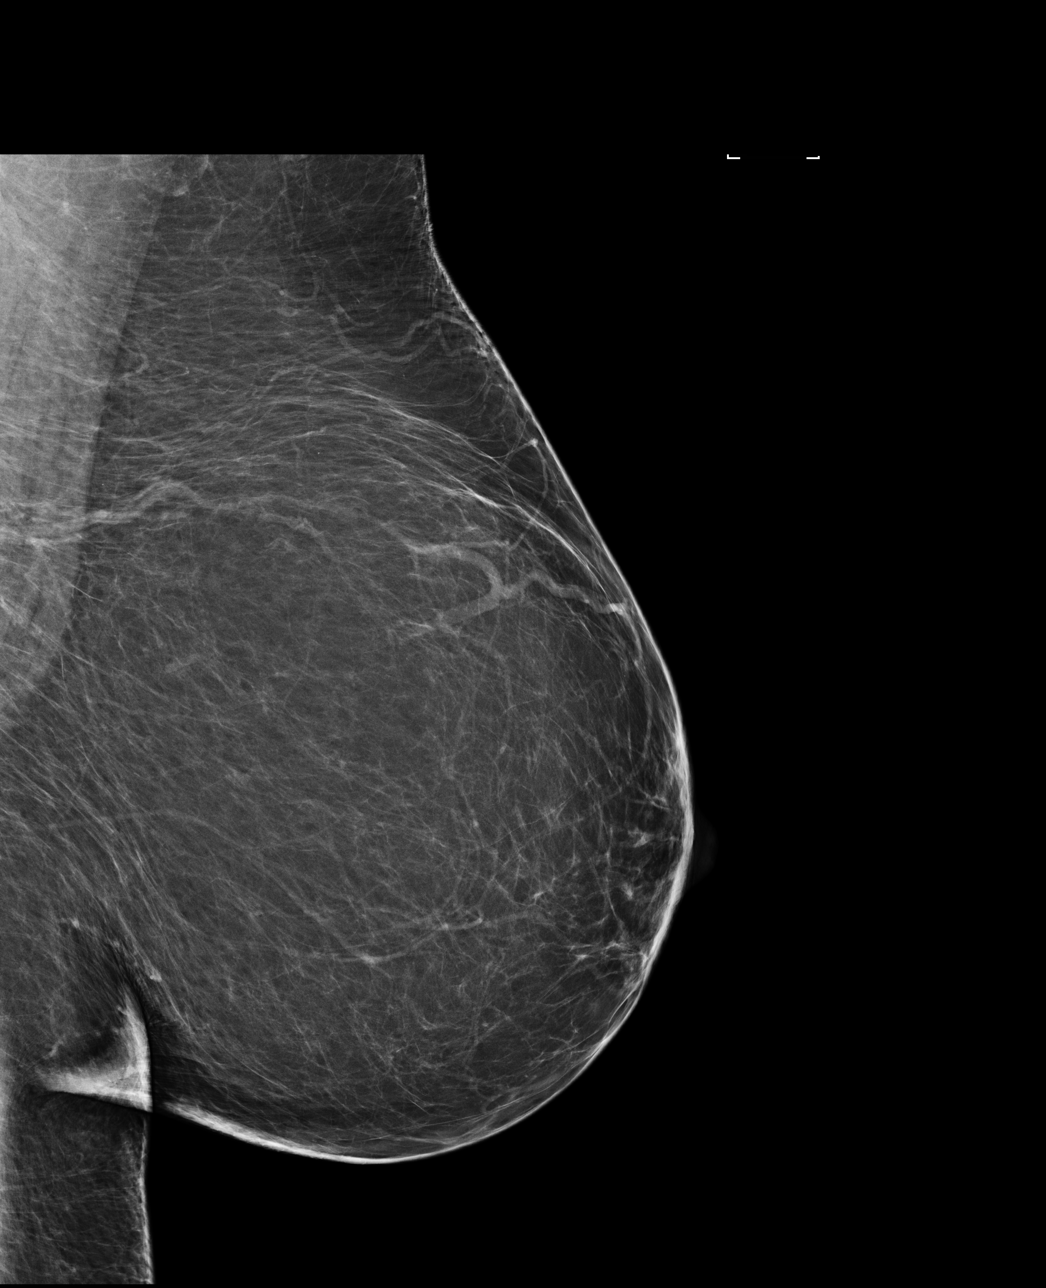

[L CC]
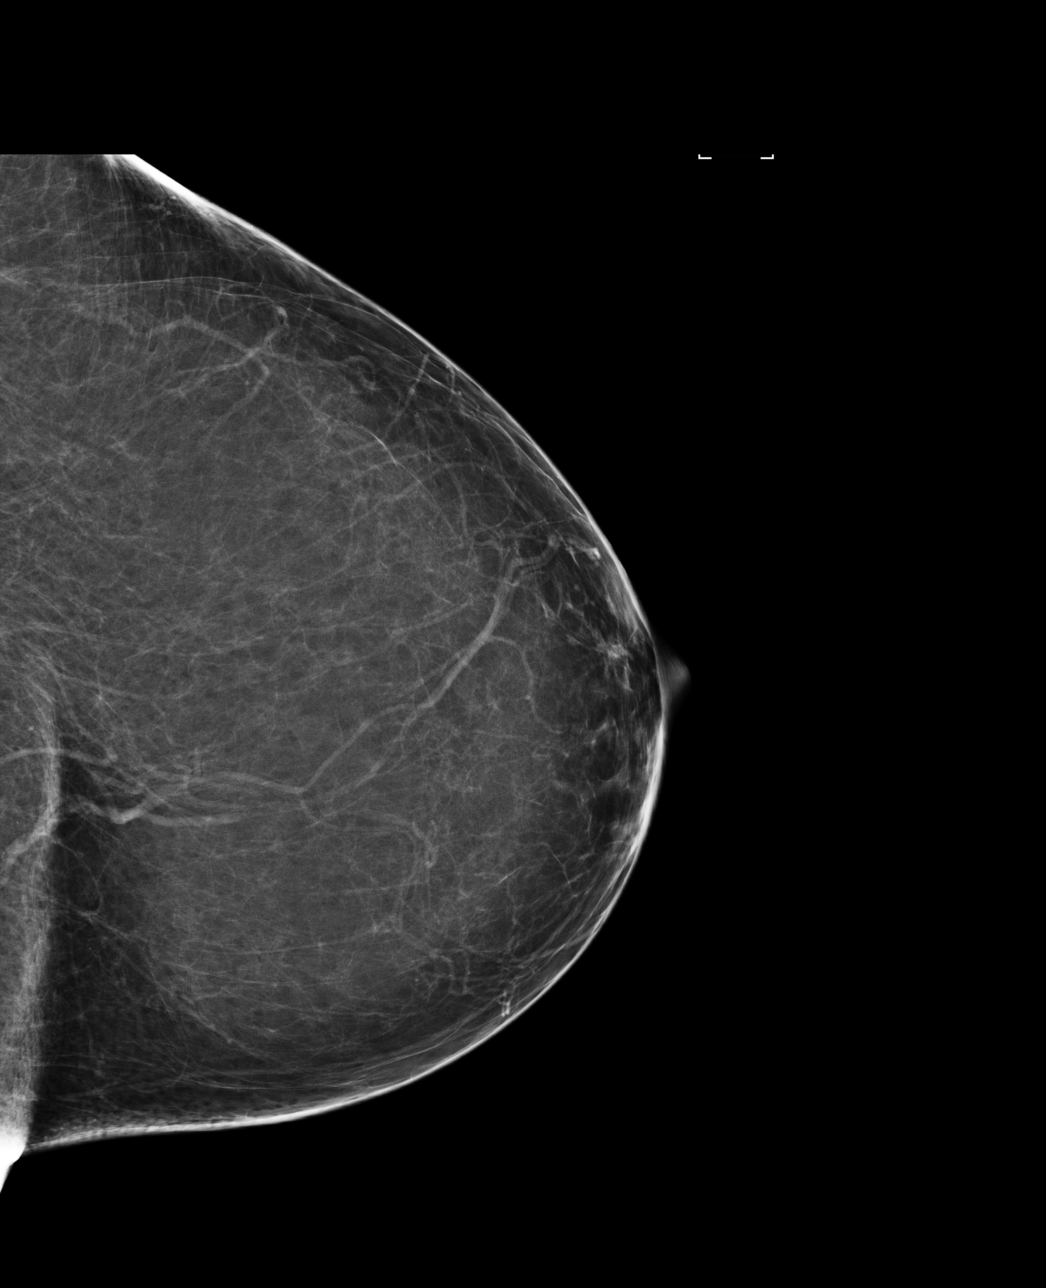

[R CC]
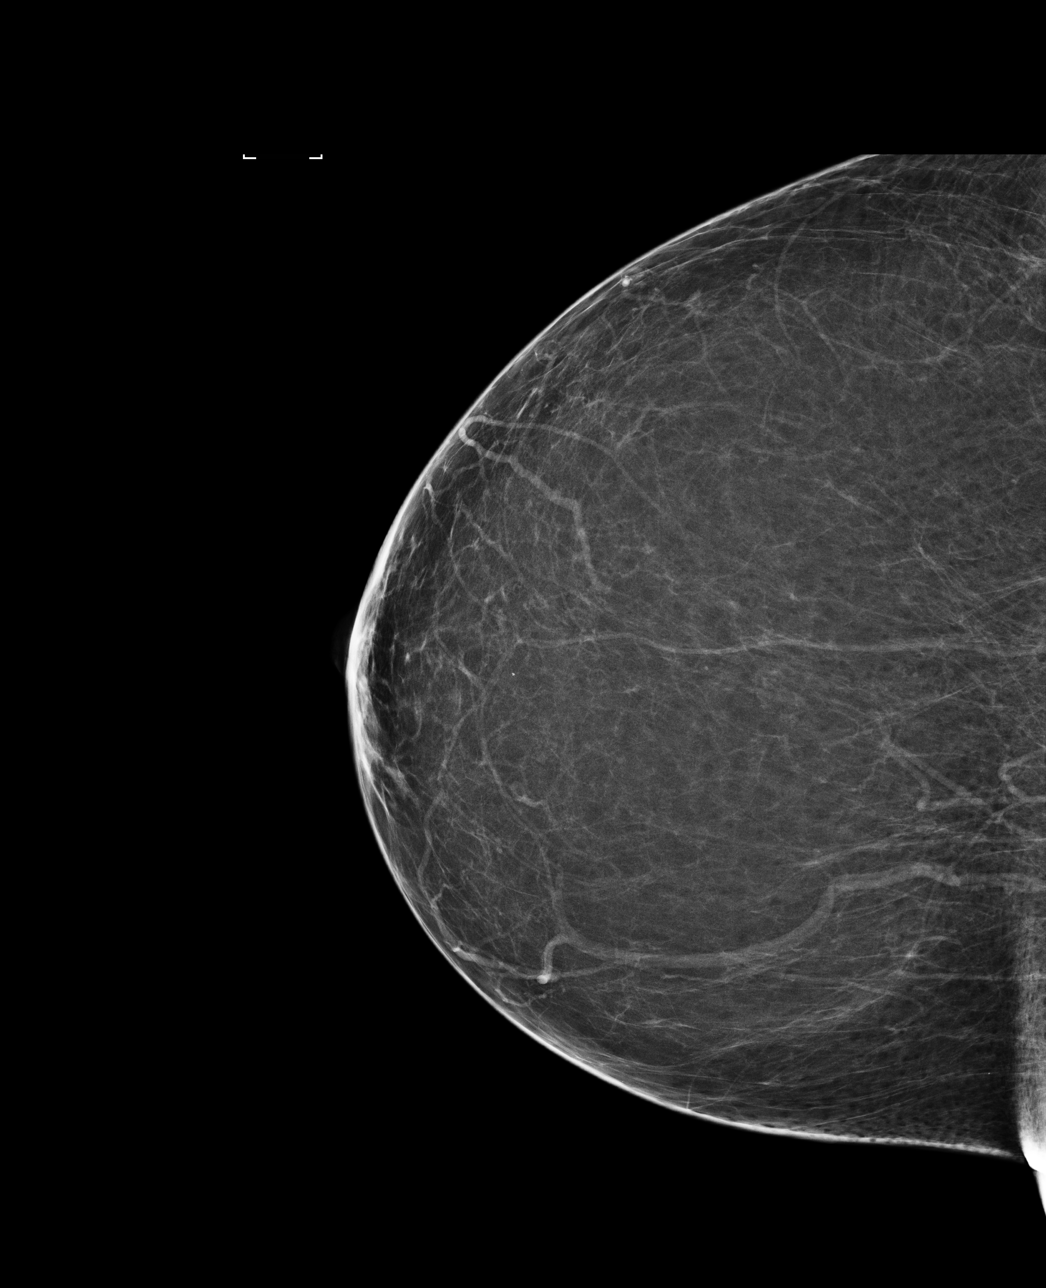

[4 of 4 positions shown; findings below may reference images not displayed]

ACR Breast Density Category b: There are scattered areas of
fibroglandular density.
FINDINGS: There are no findings suspicious for malignancy. Images were
processed with CAD.
IMPRESSION: No mammographic evidence of malignancy. A result letter of this
screening mammogram will be mailed directly to the patient.

RECOMMENDATION:
Screening mammogram in one year. (Code:AS-G-LCT)

BI-RADS CATEGORY  1: Negative.

## 2022-04-27 ENCOUNTER — Telehealth: Payer: Self-pay | Admitting: *Deleted

## 2022-04-27 NOTE — Patient Outreach (Signed)
  Care Coordination   04/27/2022 Name: Kelly Wilson MRN: [id number] DOB: 07/02/1956   Care Coordination Outreach Attempts:  An unsuccessful telephone outreach was attempted today to offer the patient information about available care coordination services as a benefit of their health plan.   Follow Up Plan:  Additional outreach attempts will be made to offer the patient care coordination information and services.   Encounter Outcome:  No Answer  Care Coordination Interventions Activated:  No   Care Coordination Interventions:  No, not indicated    Raina Mina, RN Care Management Coordinator [place] Office [phone]

## 2022-04-28 ENCOUNTER — Other Ambulatory Visit: Payer: Self-pay | Admitting: Family Medicine

## 2022-04-28 ENCOUNTER — Other Ambulatory Visit: Payer: Self-pay | Admitting: *Deleted

## 2022-04-28 MED ORDER — ALENDRONATE SODIUM 70 MG PO TABS
ORAL_TABLET | ORAL | 0 refills | Status: DC
Start: 2022-04-28 — End: 2022-08-04

## 2022-04-30 ENCOUNTER — Encounter: Payer: Self-pay | Admitting: *Deleted

## 2022-04-30 ENCOUNTER — Telehealth: Payer: Self-pay | Admitting: *Deleted

## 2022-04-30 NOTE — Patient Outreach (Signed)
  Care Coordination   Initial Visit Note   04/30/2022 Name: ALIANNY TOELLE MRN: [id number] DOB: 1956-01-29  JAMYRIA OZANICH is a 66 y.o. year old female who sees No primary care provider on file. for primary care. I spoke with  Charlott Rakes by phone today.  What matters to the patients health and wellness today?  No needs    Goals Addressed               This Visit's Progress     COMPLETED: No needs (pt-stated)        Care Coordination Interventions: Advised patient to contact her provider to scheduled her AWV for this year Reviewed medications with patient and discussed adherence to medications and any needed refills. Reviewed scheduled/upcoming provider appointments including pending appointments Assessed social determinant of health barriers          SDOH assessments and interventions completed:  Yes  SDOH Interventions Today    Flowsheet Row Most Recent Value  SDOH Interventions   Transportation Interventions Intervention Not Indicated        Care Coordination Interventions Activated:  Yes  Care Coordination Interventions:  Yes, provided   Follow up plan: No further intervention required.   Encounter Outcome:  Pt. Visit Completed   Raina Mina, RN Care Management Coordinator [place] Office [phone]

## 2022-04-30 NOTE — Patient Instructions (Signed)
Visit Information  Thank you for taking time to visit with me today. Please don't hesitate to contact me if I can be of assistance to you.   Following are the goals we discussed today:   Goals Addressed               This Visit's Progress     COMPLETED: No needs (pt-stated)        Care Coordination Interventions: Advised patient to contact her provider to scheduled her AWV for this year Reviewed medications with patient and discussed adherence to medications and any needed refills. Reviewed scheduled/upcoming provider appointments including pending appointments Assessed social determinant of health barriers           Please call the care guide team at [phone] if you need to cancel or reschedule your appointment.   If you are experiencing a Mental Health or [place] or need someone to talk to, please call the Suicide and Crisis Lifeline: 988  Patient verbalizes understanding of instructions and care plan provided today and agrees to view in [place]. Active MyChart status and patient understanding of how to access instructions and care plan via MyChart confirmed with patient.     No further follow up required: No needs    Raina Mina, RN Care Management Coordinator [place] Office [phone]
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
## 2022-05-04 ENCOUNTER — Ambulatory Visit (INDEPENDENT_AMBULATORY_CARE_PROVIDER_SITE_OTHER): Payer: Medicare HMO | Admitting: Family Medicine

## 2022-05-04 ENCOUNTER — Encounter: Payer: Self-pay | Admitting: Family Medicine

## 2022-05-04 VITALS — BP 126/80 | HR 83 | Temp 98.1°F | Resp 16 | Ht 66.0 in | Wt 223.4 lb

## 2022-05-04 DIAGNOSIS — I10 Essential (primary) hypertension: Secondary | ICD-10-CM

## 2022-05-04 DIAGNOSIS — M816 Localized osteoporosis [Lequesne]: Secondary | ICD-10-CM

## 2022-05-04 DIAGNOSIS — D509 Iron deficiency anemia, unspecified: Secondary | ICD-10-CM | POA: Diagnosis not present

## 2022-05-04 DIAGNOSIS — F419 Anxiety disorder, unspecified: Secondary | ICD-10-CM

## 2022-05-04 DIAGNOSIS — E785 Hyperlipidemia, unspecified: Secondary | ICD-10-CM | POA: Diagnosis not present

## 2022-05-04 DIAGNOSIS — K219 Gastro-esophageal reflux disease without esophagitis: Secondary | ICD-10-CM | POA: Diagnosis not present

## 2022-05-04 DIAGNOSIS — N1831 Chronic kidney disease, stage 3a: Secondary | ICD-10-CM

## 2022-05-04 LAB — CBC
HCT: 40.7 % (ref 36.0–46.0)
Hemoglobin: 13.7 g/dL (ref 12.0–15.0)
MCHC: 33.7 g/dL (ref 30.0–36.0)
MCV: 85.7 fl (ref 78.0–100.0)
Platelets: 345 10*3/uL (ref 150.0–400.0)
RBC: 4.75 Mil/uL (ref 3.87–5.11)
RDW: 13.6 % (ref 11.5–15.5)
WBC: 8.6 10*3/uL (ref 4.0–10.5)

## 2022-05-04 LAB — COMPREHENSIVE METABOLIC PANEL
ALT: 9 U/L (ref 0–35)
AST: 13 U/L (ref 0–37)
Albumin: 4.1 g/dL (ref 3.5–5.2)
Alkaline Phosphatase: 72 U/L (ref 39–117)
BUN: 19 mg/dL (ref 6–23)
CO2: 28 mEq/L (ref 19–32)
Calcium: 9.4 mg/dL (ref 8.4–10.5)
Chloride: 102 mEq/L (ref 96–112)
Creatinine, Ser: 0.99 mg/dL (ref 0.40–1.20)
GFR: 58.51 mL/min — ABNORMAL LOW (ref >60.00)
Glucose, Bld: 82 mg/dL (ref 70–99)
Potassium: 4.4 mEq/L (ref 3.5–5.1)
Sodium: 138 mEq/L (ref 135–145)
Total Bilirubin: 0.4 mg/dL (ref 0.2–1.2)
Total Protein: 6.8 g/dL (ref 6.0–8.3)

## 2022-05-04 NOTE — Patient Instructions (Addendum)
A few things to remember from today's visit:   Essential hypertension - Plan: Comprehensive metabolic panel, CBC  Hyperlipidemia, unspecified hyperlipidemia type - Plan: Comprehensive metabolic panel  Gastroesophageal reflux disease, unspecified whether esophagitis present  Localized osteoporosis without current pathological fracture - Plan: DG Bone Density  We can check cholesterol next visit. No changes today.  If you need refills for medications you take chronically, please call your pharmacy. Do not use My Chart to request refills or for acute issues that need immediate attention. If you send a my chart message, it may take a few days to be addressed, specially if I am not in the office.  Please be sure medication list is accurate. If a new problem present, please set up appointment sooner than planned today.

## 2022-05-05 ENCOUNTER — Ambulatory Visit (INDEPENDENT_AMBULATORY_CARE_PROVIDER_SITE_OTHER): Payer: Medicare HMO

## 2022-05-05 ENCOUNTER — Ambulatory Visit: Payer: Medicare HMO | Admitting: Orthopaedic Surgery

## 2022-05-05 DIAGNOSIS — M1812 Unilateral primary osteoarthritis of first carpometacarpal joint, left hand: Secondary | ICD-10-CM | POA: Diagnosis not present

## 2022-05-05 DIAGNOSIS — Z96642 Presence of left artificial hip joint: Secondary | ICD-10-CM | POA: Diagnosis not present

## 2022-05-05 MED ORDER — METHYLPREDNISOLONE ACETATE 40 MG/ML IJ SUSP
13.3300 mg | INTRAMUSCULAR | Status: AC | PRN
  Administered 2022-05-05: 13.33 mg via INTRA_ARTICULAR

## 2022-05-05 MED ORDER — LIDOCAINE HCL 1 % IJ SOLN
0.3000 mL | INTRAMUSCULAR | Status: AC | PRN
  Administered 2022-05-05: .3 mL

## 2022-05-05 MED ORDER — BUPIVACAINE HCL 0.5 % IJ SOLN
0.3300 mL | INTRAMUSCULAR | Status: AC | PRN
  Administered 2022-05-05: .33 mL via INTRA_ARTICULAR
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
## 2022-05-06 ENCOUNTER — Encounter: Payer: Self-pay | Admitting: Family Medicine

## 2022-05-06 DIAGNOSIS — N183 Chronic kidney disease, stage 3 unspecified: Secondary | ICD-10-CM | POA: Insufficient documentation

## 2022-05-06 MED ORDER — LOSARTAN POTASSIUM-HCTZ 100-12.5 MG PO TABS: 1.0000 | ORAL_TABLET | Freq: Every day | ORAL | 2 refills | Status: DC

## 2022-05-12 ENCOUNTER — Other Ambulatory Visit: Payer: Self-pay | Admitting: Family Medicine

## 2022-05-12 DIAGNOSIS — Z1231 Encounter for screening mammogram for malignant neoplasm of breast: Secondary | ICD-10-CM

## 2022-05-21 DIAGNOSIS — H2513 Age-related nuclear cataract, bilateral: Secondary | ICD-10-CM | POA: Diagnosis not present

## 2022-05-21 DIAGNOSIS — G43819 Other migraine, intractable, without status migrainosus: Secondary | ICD-10-CM | POA: Diagnosis not present

## 2022-05-22 ENCOUNTER — Telehealth: Payer: Self-pay | Admitting: *Deleted

## 2022-05-22 ENCOUNTER — Ambulatory Visit (INDEPENDENT_AMBULATORY_CARE_PROVIDER_SITE_OTHER): Payer: Medicare HMO

## 2022-05-22 ENCOUNTER — Ambulatory Visit: Payer: Medicare HMO | Admitting: Orthopaedic Surgery

## 2022-05-22 ENCOUNTER — Encounter: Payer: Self-pay | Admitting: Orthopaedic Surgery

## 2022-05-22 DIAGNOSIS — M25512 Pain in left shoulder: Secondary | ICD-10-CM

## 2022-05-22 MED ORDER — HYDROCODONE-ACETAMINOPHEN 5-325 MG PO TABS: 1.0000 | ORAL_TABLET | Freq: Two times a day (BID) | ORAL | 0 refills | Status: DC | PRN

## 2022-05-22 MED ORDER — BUPIVACAINE HCL 0.5 % IJ SOLN
3.0000 mL | INTRAMUSCULAR | Status: AC | PRN
  Administered 2022-05-22: 3 mL via INTRA_ARTICULAR

## 2022-05-22 MED ORDER — LIDOCAINE HCL 1 % IJ SOLN
3.0000 mL | INTRAMUSCULAR | Status: AC | PRN
  Administered 2022-05-22: 3 mL

## 2022-05-22 MED ORDER — METHYLPREDNISOLONE ACETATE 40 MG/ML IJ SUSP
40.0000 mg | INTRAMUSCULAR | Status: AC | PRN
  Administered 2022-05-22: 40 mg via INTRA_ARTICULAR
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
## 2022-05-22 NOTE — Telephone Encounter (Signed)
Ortho bundle 1 year call and survey completed.

## 2022-05-29 ENCOUNTER — Ambulatory Visit: Payer: Medicare HMO | Admitting: Physical Therapy

## 2022-05-29 ENCOUNTER — Encounter: Payer: Self-pay | Admitting: Physical Therapy

## 2022-05-29 ENCOUNTER — Other Ambulatory Visit: Payer: Self-pay

## 2022-05-29 DIAGNOSIS — M25512 Pain in left shoulder: Secondary | ICD-10-CM

## 2022-05-29 DIAGNOSIS — R600 Localized edema: Secondary | ICD-10-CM

## 2022-05-29 DIAGNOSIS — M6281 Muscle weakness (generalized): Secondary | ICD-10-CM | POA: Diagnosis not present
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
## 2022-06-05 ENCOUNTER — Other Ambulatory Visit: Payer: Self-pay | Admitting: Physician Assistant

## 2022-06-05 ENCOUNTER — Encounter: Payer: Self-pay | Admitting: Physical Therapy

## 2022-06-05 ENCOUNTER — Telehealth: Payer: Self-pay | Admitting: Orthopaedic Surgery

## 2022-06-05 ENCOUNTER — Ambulatory Visit: Payer: Medicare HMO | Admitting: Physical Therapy

## 2022-06-05 DIAGNOSIS — M6281 Muscle weakness (generalized): Secondary | ICD-10-CM

## 2022-06-05 DIAGNOSIS — R600 Localized edema: Secondary | ICD-10-CM

## 2022-06-05 DIAGNOSIS — M25512 Pain in left shoulder: Secondary | ICD-10-CM

## 2022-06-05 MED ORDER — HYDROCODONE-ACETAMINOPHEN 5-325 MG PO TABS: 1.0000 | ORAL_TABLET | Freq: Every day | ORAL | 0 refills | Status: DC | PRN

## 2022-06-05 NOTE — Telephone Encounter (Signed)
Sent in

## 2022-06-05 NOTE — Telephone Encounter (Signed)
Notified patient.

## 2022-06-05 NOTE — Telephone Encounter (Signed)
Pt called requesting a refill of hydrocodone. Please send to pharmacy on file. Pt phone number is [phone].
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
## 2022-06-10 ENCOUNTER — Encounter: Payer: Self-pay | Admitting: Physical Therapy

## 2022-06-10 ENCOUNTER — Ambulatory Visit: Payer: Medicare HMO | Admitting: Physical Therapy

## 2022-06-10 ENCOUNTER — Telehealth: Payer: Self-pay | Admitting: Orthopaedic Surgery

## 2022-06-10 DIAGNOSIS — M25512 Pain in left shoulder: Secondary | ICD-10-CM | POA: Diagnosis not present

## 2022-06-10 DIAGNOSIS — R600 Localized edema: Secondary | ICD-10-CM | POA: Diagnosis not present

## 2022-06-10 DIAGNOSIS — M6281 Muscle weakness (generalized): Secondary | ICD-10-CM

## 2022-06-10 MED ORDER — HYDROCODONE-ACETAMINOPHEN 5-325 MG PO TABS: 1.0000 | ORAL_TABLET | Freq: Every day | ORAL | 0 refills | Status: DC | PRN

## 2022-06-10 NOTE — Telephone Encounter (Signed)
I called and talked to the pt. She stated she is having a lot of pain after PT today. She stated she only has 4 pills left and is concerned about not having enough to get to Monday if she continues to have this intense pain. She stated she can't take nsaids and tylenol isnt helping. Pt is not on a muscle relaxer either. Please advise

## 2022-06-10 NOTE — Telephone Encounter (Signed)
Called and advised pt.

## 2022-06-10 NOTE — Telephone Encounter (Signed)
Pt called requesting a refill of hydrocodone. Please send to pharmacy on file. [facility] on [address]. Pt phone number is [phone].
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
## 2022-06-10 NOTE — Telephone Encounter (Signed)
done

## 2022-06-10 NOTE — Telephone Encounter (Signed)
Please advise 

## 2022-06-10 NOTE — Telephone Encounter (Signed)
Why does she need a refill?

## 2022-06-18 ENCOUNTER — Encounter: Payer: Self-pay | Admitting: Family Medicine

## 2022-06-19 ENCOUNTER — Ambulatory Visit: Payer: Medicare HMO | Admitting: Physical Therapy

## 2022-06-19 ENCOUNTER — Encounter: Payer: Self-pay | Admitting: Physical Therapy

## 2022-06-19 DIAGNOSIS — R600 Localized edema: Secondary | ICD-10-CM

## 2022-06-19 DIAGNOSIS — M25512 Pain in left shoulder: Secondary | ICD-10-CM

## 2022-06-19 DIAGNOSIS — M6281 Muscle weakness (generalized): Secondary | ICD-10-CM | POA: Diagnosis not present
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
## 2022-06-25 ENCOUNTER — Ambulatory Visit: Payer: Medicare HMO | Admitting: Orthopaedic Surgery

## 2022-06-25 ENCOUNTER — Encounter: Payer: Self-pay | Admitting: Orthopaedic Surgery

## 2022-06-25 DIAGNOSIS — M25512 Pain in left shoulder: Secondary | ICD-10-CM

## 2022-06-25 DIAGNOSIS — G8929 Other chronic pain: Secondary | ICD-10-CM

## 2022-06-25 NOTE — Addendum Note (Signed)
Addended by: Lendon Collar on: 06/25/2022 10:23 AM   Modules accepted: Orders
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
## 2022-06-26 ENCOUNTER — Encounter: Payer: Medicare HMO | Admitting: Physical Therapy

## 2022-06-29 ENCOUNTER — Other Ambulatory Visit: Payer: Self-pay | Admitting: Family

## 2022-06-30 ENCOUNTER — Other Ambulatory Visit: Payer: Self-pay | Admitting: *Deleted

## 2022-06-30 NOTE — Telephone Encounter (Signed)
Last OV 04/2022 Last filled 02/20/2022

## 2022-07-03 ENCOUNTER — Encounter: Payer: Medicare HMO | Admitting: Physical Therapy

## 2022-07-03 MED ORDER — CLONAZEPAM 0.5 MG PO TABS: ORAL_TABLET | ORAL | 1 refills | Status: DC

## 2022-07-03 NOTE — Telephone Encounter (Signed)
Pt also need a refill on pravastatin (PRAVACHOL) 10 MG tablet  [id number] - [address], [address] AT [address] [place] Phone: [phone]  Fax: [phone]

## 2022-08-04 ENCOUNTER — Ambulatory Visit
Admission: RE | Admit: 2022-08-04 | Discharge: 2022-08-04 | Disposition: A | Discharge status: Home / Self Care | Payer: Medicare HMO | Source: Ambulatory Visit | Attending: Orthopaedic Surgery | Admitting: Orthopaedic Surgery

## 2022-08-04 ENCOUNTER — Other Ambulatory Visit: Payer: Self-pay | Admitting: Family

## 2022-08-04 ENCOUNTER — Telehealth: Payer: Self-pay | Admitting: Family Medicine

## 2022-08-04 DIAGNOSIS — G8929 Other chronic pain: Secondary | ICD-10-CM | POA: Diagnosis not present

## 2022-08-04 DIAGNOSIS — S46012A Strain of muscle(s) and tendon(s) of the rotator cuff of left shoulder, initial encounter: Secondary | ICD-10-CM | POA: Diagnosis not present

## 2022-08-04 DIAGNOSIS — M25512 Pain in left shoulder: Secondary | ICD-10-CM | POA: Diagnosis not present

## 2022-08-04 MED ORDER — IOPAMIDOL (ISOVUE-M 200) INJECTION 41%
12.0000 mL | Freq: Once | INTRAMUSCULAR | Status: AC
  Administered 2022-08-04: 12 mL via INTRA_ARTICULAR

## 2022-08-04 MED ORDER — ALENDRONATE SODIUM 70 MG PO TABS: ORAL_TABLET | ORAL | 2 refills | Status: DC

## 2022-08-04 NOTE — Telephone Encounter (Signed)
Rx sent in as requested. 

## 2022-08-04 NOTE — Telephone Encounter (Signed)
alendronate (FOSAMAX) 70 MG tablet   [id number] - [address], [address] AT [address] [place] Phone: [phone]  Fax: [phone]

## 2022-08-05 NOTE — Progress Notes (Signed)
Tried to call. No answer. LMOM for patient to call us back to schedule a follow up appointment to review CT results.

## 2022-08-05 NOTE — Progress Notes (Signed)
Needs appt.  Thanks.

## 2022-08-10 MED ORDER — PRAVASTATIN SODIUM 10 MG PO TABS: ORAL_TABLET | ORAL | 1 refills | Status: DC

## 2022-08-10 NOTE — Addendum Note (Signed)
Addended by: Rodrigo Ran on: 08/10/2022 03:36 PM   Modules accepted: Orders

## 2022-08-10 NOTE — Telephone Encounter (Signed)
Rx requests were being sent to previous provider; Rx sent in.

## 2022-08-10 NOTE — Telephone Encounter (Signed)
Patient called into the office today to ask about  pravastatin (PRAVACHOL) 10 MG tablet    she stated she has checked with the pharmacy and they are stating it is still pending. She stated she is completely out.

## 2022-08-11 ENCOUNTER — Ambulatory Visit: Payer: Medicare HMO | Admitting: Orthopaedic Surgery

## 2022-08-11 ENCOUNTER — Ambulatory Visit: Payer: Self-pay

## 2022-08-11 ENCOUNTER — Ambulatory Visit (INDEPENDENT_AMBULATORY_CARE_PROVIDER_SITE_OTHER): Payer: Medicare HMO | Admitting: Sports Medicine

## 2022-08-11 DIAGNOSIS — G8929 Other chronic pain: Secondary | ICD-10-CM

## 2022-08-11 DIAGNOSIS — M25512 Pain in left shoulder: Secondary | ICD-10-CM | POA: Diagnosis not present

## 2022-08-11 MED ORDER — BUPIVACAINE HCL 0.25 % IJ SOLN
2.0000 mL | INTRAMUSCULAR | Status: AC | PRN
  Administered 2022-08-11: 2 mL via INTRA_ARTICULAR

## 2022-08-11 MED ORDER — METHYLPREDNISOLONE ACETATE 40 MG/ML IJ SUSP
80.0000 mg | INTRAMUSCULAR | Status: AC | PRN
  Administered 2022-08-11: 80 mg via INTRA_ARTICULAR

## 2022-08-11 MED ORDER — LIDOCAINE HCL 1 % IJ SOLN
2.0000 mL | INTRAMUSCULAR | Status: AC | PRN
  Administered 2022-08-11: 2 mL

## 2022-08-11 NOTE — Progress Notes (Signed)
   Procedure Note  Patient: Kelly Wilson             Date of Birth: [birth year]           MRN: [id number]             Visit Date: 08/11/2022  Procedures: Visit Diagnoses:  1. Chronic left shoulder pain     Large Joint Inj: L glenohumeral on 08/11/2022 9:45 AM Indications: pain Details: 22 G 3.5 in needle, ultrasound-guided posterior approach Medications: 2 mL lidocaine 1 %; 2 mL bupivacaine 0.25 %; 80 mg methylPREDNISolone acetate 40 MG/ML Outcome: tolerated well, no immediate complications  US-guided glenohumeral joint injection, left shoulder After discussion on risks/benefits/indications, informed verbal consent was obtained. A timeout was then performed. The patient was positioned lying lateral recumbent on examination table. The patient's shoulder was prepped with betadine and multiple alcohol swabs and utilizing ultrasound guidance, the patient's glenohumeral joint was identified on ultrasound. Using ultrasound guidance a 22-gauge, 3.5 inch needle with a mixture of 2:2:2 cc's lidocaine:bupivicaine:depomedrol was directed from a lateral to medial direction via in-plane technique into the glenohumeral joint with visualization of appropriate spread of injectate into the joint. Patient tolerated the procedure well without immediate complications.      Procedure, treatment alternatives, risks and benefits explained, specific risks discussed. Consent was given by the patient. Immediately prior to procedure a time out was called to verify the correct patient, procedure, equipment, support staff and site/side marked as required. Patient was prepped and draped in the usual sterile fashion.    - I evaluated the patient about 10 minutes post-injection and she had good improvement in pain and range of motion - follow-up with Dr. Erlinda Hong as indicated; I am happy to see them as needed  Elba Barman, DO [place]  This note was  dictated using Dragon naturally speaking software and may contain errors in syntax, spelling, or content which have not been identified prior to signing this note.
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
## 2022-08-12 ENCOUNTER — Other Ambulatory Visit: Payer: Self-pay | Admitting: Physician Assistant

## 2022-08-12 ENCOUNTER — Telehealth: Payer: Self-pay | Admitting: Orthopaedic Surgery

## 2022-08-12 MED ORDER — HYDROCODONE-ACETAMINOPHEN 5-325 MG PO TABS: 1.0000 | ORAL_TABLET | Freq: Two times a day (BID) | ORAL | 0 refills | Status: DC | PRN

## 2022-08-12 NOTE — Telephone Encounter (Signed)
This is a Dr. Erlinda Hong pt in the office yesterday.

## 2022-08-12 NOTE — Telephone Encounter (Signed)
I sent in norco

## 2022-08-12 NOTE — Telephone Encounter (Signed)
A small percent of people have increased pain initially following the injection.  I am happy to call in some pain meds if needed

## 2022-08-12 NOTE — Telephone Encounter (Signed)
Called and notified patient

## 2022-08-12 NOTE — Telephone Encounter (Signed)
Patient states she got an injection and she is in severe pain. Sending call to triage.

## 2022-08-12 NOTE — Telephone Encounter (Signed)
Patient left a VM stating that left shoulder pain is worse now since she received cortisone injection.  Stated that she used ice all night.  Would like a Cb to discuss?  Cb# [phone]
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
## 2022-10-13 ENCOUNTER — Encounter: Payer: Self-pay | Admitting: Family Medicine

## 2022-10-13 ENCOUNTER — Ambulatory Visit (INDEPENDENT_AMBULATORY_CARE_PROVIDER_SITE_OTHER): Payer: Medicare HMO | Admitting: Family Medicine

## 2022-10-13 VITALS — BP 128/80 | HR 88 | Temp 98.6°F | Resp 16 | Ht 66.0 in | Wt 222.0 lb

## 2022-10-13 DIAGNOSIS — F419 Anxiety disorder, unspecified: Secondary | ICD-10-CM

## 2022-10-13 DIAGNOSIS — N1831 Chronic kidney disease, stage 3a: Secondary | ICD-10-CM

## 2022-10-13 DIAGNOSIS — Z0000 Encounter for general adult medical examination without abnormal findings: Secondary | ICD-10-CM | POA: Diagnosis not present

## 2022-10-13 DIAGNOSIS — E042 Nontoxic multinodular goiter: Secondary | ICD-10-CM

## 2022-10-13 DIAGNOSIS — Z23 Encounter for immunization: Secondary | ICD-10-CM | POA: Diagnosis not present

## 2022-10-13 DIAGNOSIS — I10 Essential (primary) hypertension: Secondary | ICD-10-CM

## 2022-10-13 DIAGNOSIS — E785 Hyperlipidemia, unspecified: Secondary | ICD-10-CM | POA: Diagnosis not present

## 2022-10-13 LAB — LIPID PANEL
Cholesterol: 234 mg/dL — ABNORMAL HIGH (ref 0–200)
HDL: 78.2 mg/dL (ref >39.00)
LDL Cholesterol: 117 mg/dL — ABNORMAL HIGH (ref 0–99)
NonHDL: 155.93
Total CHOL/HDL Ratio: 3
Triglycerides: 196 mg/dL — ABNORMAL HIGH (ref 0.0–149.0)
VLDL: 39.2 mg/dL (ref 0.0–40.0)

## 2022-10-13 LAB — BASIC METABOLIC PANEL
BUN: 16 mg/dL (ref 6–23)
CO2: 28 mEq/L (ref 19–32)
Calcium: 9.6 mg/dL (ref 8.4–10.5)
Chloride: 103 mEq/L (ref 96–112)
Creatinine, Ser: 0.88 mg/dL (ref 0.40–1.20)
GFR: 67.19 mL/min (ref >60.00)
Glucose, Bld: 90 mg/dL (ref 70–99)
Potassium: 4 mEq/L (ref 3.5–5.1)
Sodium: 139 mEq/L (ref 135–145)

## 2022-10-13 LAB — CBC
HCT: 44.4 % (ref 36.0–46.0)
Hemoglobin: 14.8 g/dL (ref 12.0–15.0)
MCHC: 33.4 g/dL (ref 30.0–36.0)
MCV: 85.7 fl (ref 78.0–100.0)
Platelets: 302 10*3/uL (ref 150.0–400.0)
RBC: 5.18 Mil/uL — ABNORMAL HIGH (ref 3.87–5.11)
RDW: 14.7 % (ref 11.5–15.5)
WBC: 5.5 10*3/uL (ref 4.0–10.5)

## 2022-10-13 LAB — TSH: TSH: 3.82 u[IU]/mL (ref 0.35–5.50)

## 2022-10-13 LAB — MICROALBUMIN / CREATININE URINE RATIO
Creatinine,U: 150 mg/dL
Microalb Creat Ratio: 0.6 mg/g (ref 0.0–30.0)
Microalb, Ur: 0.9 mg/dL (ref 0.0–1.9)

## 2022-10-13 LAB — VITAMIN D 25 HYDROXY (VIT D DEFICIENCY, FRACTURES): VITD: 53.12 ng/mL (ref 30.00–100.00)

## 2022-10-13 NOTE — Patient Instructions (Addendum)
A few things to remember from today's visit:  Routine general medical examination at a health care facility  Essential hypertension - Plan: Basic metabolic panel, TSH  Hyperlipidemia, unspecified hyperlipidemia type - Plan: Lipid panel, Basic metabolic panel  GOITER, MULTINODULAR - Plan: TSH  Stage 3a chronic kidney disease ([place]) - Plan: Microalbumin / creatinine urine ratio, CBC, VITAMIN D 25 Hydroxy (Vit-D Deficiency, Fractures)  Orders for mammogram and bone density were placed in 04/2022, please call to schedule appts.  If you need refills for medications you take chronically, please call your pharmacy. Do not use My Chart to request refills or for acute issues that need immediate attention. If you send a my chart message, it may take a few days to be addressed, specially if I am not in the office.  Please be sure medication list is accurate. If a new problem present, please set up appointment sooner than planned today.

## 2022-10-15 ENCOUNTER — Encounter: Payer: Self-pay | Admitting: Family Medicine

## 2022-10-15 DIAGNOSIS — I700 Atherosclerosis of aorta: Secondary | ICD-10-CM

## 2022-10-16 NOTE — Assessment & Plan Note (Signed)
No changes in Clonazepam dose. F/U in 6 months and if problem is stable (+ other comorbilities) and clonazepam Rx last a year, we can consider annual follow up as she is requesting.

## 2022-10-16 NOTE — Assessment & Plan Note (Signed)
We discussed Dx, prognosis,and goals of treatment. Continue adequate hydration,low salt diet,and avoidance of NSAID's.

## 2022-10-16 NOTE — Assessment & Plan Note (Signed)
Continue Pravastatin 10 mg daily. She has tried other statins in the past, not well tolerated. Further recommendations according to lipid panel results.

## 2022-10-16 NOTE — Assessment & Plan Note (Signed)
S/P partial thyroidectomy, right-sided nodule. Will continue monitoring annual TSH.

## 2022-10-16 NOTE — Assessment & Plan Note (Signed)
BP adequately controlled. No changes in current management. 

## 2022-10-16 NOTE — Assessment & Plan Note (Signed)
We discussed the importance of regular physical activity and healthy diet for prevention of chronic illness and/or complications. Preventive guidelines reviewed. Vaccination up to date, she has zoster vaccine years ago,"old" vaccine. She can get shingrix at her pharmacy. Today Prevnar 20 given. Ca++ and vit D supplementation recommended. Next CPE in a year.

## 2022-11-14 ENCOUNTER — Other Ambulatory Visit: Payer: Self-pay | Admitting: Family Medicine

## 2022-11-18 ENCOUNTER — Telehealth: Payer: Self-pay | Admitting: Family Medicine

## 2022-11-18 NOTE — Telephone Encounter (Signed)
Contacted Kelly Wilson to schedule their annual wellness visit. Patient declined to schedule AWV at this time.  Do not call   [name] AWV direct phone # [phone]   Spoke to patient to schedule AWV  patient stated she does not need this appt

## 2022-12-04 DIAGNOSIS — I700 Atherosclerosis of aorta: Secondary | ICD-10-CM | POA: Insufficient documentation

## 2022-12-08 ENCOUNTER — Ambulatory Visit
Admission: RE | Admit: 2022-12-08 | Discharge: 2022-12-08 | Disposition: A | Discharge status: Home / Self Care | Payer: Medicare HMO | Source: Ambulatory Visit | Attending: Family Medicine | Admitting: Family Medicine

## 2022-12-08 DIAGNOSIS — Z1231 Encounter for screening mammogram for malignant neoplasm of breast: Secondary | ICD-10-CM | POA: Diagnosis not present

## 2022-12-09 ENCOUNTER — Encounter: Payer: Self-pay | Admitting: Cardiovascular Disease

## 2022-12-09 ENCOUNTER — Ambulatory Visit: Payer: Medicare HMO | Attending: Cardiovascular Disease | Admitting: Cardiovascular Disease

## 2022-12-09 VITALS — BP 126/72 | HR 68 | Ht 66.0 in | Wt 225.0 lb

## 2022-12-09 DIAGNOSIS — I700 Atherosclerosis of aorta: Secondary | ICD-10-CM

## 2022-12-09 MED ORDER — PRAVASTATIN SODIUM 20 MG PO TABS: 20.0000 mg | ORAL_TABLET | Freq: Every evening | ORAL | 3 refills | Status: DC

## 2022-12-09 NOTE — Patient Instructions (Signed)
Medication Instructions:  Your physician has recommended you make the following change in your medication:  1.) increase pravastatin (Pravachol) to 20 mg - one table daily  *If you need a refill on your cardiac medications before your next appointment, please call your pharmacy*   Lab Work: Please return in 12 weeks for blood work (lipids/liver)  If you have labs (blood work) drawn today and your tests are completely normal, you will receive your results only by: [employer] (if you have MyChart) OR A paper copy in the mail If you have any lab test that is abnormal or we need to change your treatment, we will call you to review the results.   Testing/Procedures: none   Follow-Up: At [facility], you and your health needs are our priority.  As part of our continuing mission to provide you with exceptional heart care, we have created designated Provider Care Teams.  These Care Teams include your primary Cardiologist (physician) and Advanced Practice Providers (APPs -  Physician Assistants and Nurse Practitioners) who all work together to provide you with the care you need, when you need it.   Your next appointment:   2 year(s)  Provider:   Verne Carrow, MD
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
## 2022-12-28 ENCOUNTER — Other Ambulatory Visit: Payer: Self-pay | Admitting: Family Medicine

## 2022-12-28 NOTE — Telephone Encounter (Signed)
Last OV 10/13/22 Last filled 09/14/22

## 2023-03-03 ENCOUNTER — Ambulatory Visit: Payer: Medicare HMO | Attending: Cardiovascular Disease

## 2023-03-03 DIAGNOSIS — I700 Atherosclerosis of aorta: Secondary | ICD-10-CM | POA: Diagnosis not present

## 2023-03-03 LAB — HEPATIC FUNCTION PANEL
ALT: 12 IU/L (ref 0–32)
AST: 17 IU/L (ref 0–40)
Albumin: 4.5 g/dL (ref 3.9–4.9)
Alkaline Phosphatase: 81 IU/L (ref 44–121)
Bilirubin Total: 0.3 mg/dL (ref 0.0–1.2)
Bilirubin, Direct: 0.11 mg/dL (ref 0.00–0.40)
Total Protein: 6.6 g/dL (ref 6.0–8.5)

## 2023-03-03 LAB — LIPID PANEL
Chol/HDL Ratio: 3 ratio (ref 0.0–4.4)
Cholesterol, Total: 256 mg/dL — ABNORMAL HIGH (ref 100–199)
HDL: 85 mg/dL (ref >39)
LDL Chol Calc (NIH): 148 mg/dL — ABNORMAL HIGH (ref 0–99)
Triglycerides: 133 mg/dL (ref 0–149)
VLDL Cholesterol Cal: 23 mg/dL (ref 5–40)

## 2023-03-08 ENCOUNTER — Other Ambulatory Visit: Payer: Self-pay | Admitting: *Deleted

## 2023-03-08 DIAGNOSIS — E785 Hyperlipidemia, unspecified: Secondary | ICD-10-CM

## 2023-03-08 DIAGNOSIS — I700 Atherosclerosis of aorta: Secondary | ICD-10-CM

## 2023-03-08 NOTE — Progress Notes (Signed)
Referral to lipid clinic per Dr. Clifton James.

## 2023-03-18 ENCOUNTER — Ambulatory Visit (INDEPENDENT_AMBULATORY_CARE_PROVIDER_SITE_OTHER): Payer: Medicare HMO

## 2023-03-18 ENCOUNTER — Other Ambulatory Visit: Payer: Self-pay

## 2023-03-18 ENCOUNTER — Ambulatory Visit: Payer: Medicare HMO | Admitting: Physician Assistant

## 2023-03-18 ENCOUNTER — Encounter: Payer: Self-pay | Admitting: Physician Assistant

## 2023-03-18 ENCOUNTER — Ambulatory Visit (INDEPENDENT_AMBULATORY_CARE_PROVIDER_SITE_OTHER): Payer: Medicare HMO | Admitting: Sports Medicine

## 2023-03-18 DIAGNOSIS — M25551 Pain in right hip: Secondary | ICD-10-CM

## 2023-03-18 DIAGNOSIS — M1611 Unilateral primary osteoarthritis, right hip: Secondary | ICD-10-CM | POA: Diagnosis not present

## 2023-03-18 MED ORDER — METHYLPREDNISOLONE ACETATE 40 MG/ML IJ SUSP
80.0000 mg | INTRAMUSCULAR | Status: AC | PRN
  Administered 2023-03-18: 80 mg via INTRA_ARTICULAR

## 2023-03-18 MED ORDER — LIDOCAINE HCL 1 % IJ SOLN
4.0000 mL | INTRAMUSCULAR | Status: AC | PRN
  Administered 2023-03-18: 4 mL

## 2023-03-18 NOTE — Progress Notes (Signed)
   Procedure Note  Patient: Kelly Wilson             Date of Birth: 1956/07/20           MRN: [id number]             Visit Date: 03/18/2023  Procedures: Visit Diagnoses:  1. Pain in right hip   2. Unilateral primary osteoarthritis, right hip    Large Joint Inj: R hip joint on 03/18/2023 10:48 AM Indications: pain Details: 22 G 3.5 in needle, ultrasound-guided anterior approach Medications: 4 mL lidocaine 1 %; 80 mg methylPREDNISolone acetate 40 MG/ML Outcome: tolerated well, no immediate complications  Procedure: US-guided intra-articular hip injection, right After discussion on risks/benefits/indications and informed verbal consent was obtained, a timeout was performed. Patient was lying supine on exam table. The hip was cleaned with betadine and alcohol swabs. Then utilizing ultrasound guidance, the patient's femoral head and neck junction was identified and subsequently injected with 4:2 lidocaine:depomedrol via an in-plane approach with ultrasound visualization of the injectate administered into the hip joint. Patient tolerated procedure well without immediate complications.  Procedure, treatment alternatives, risks and benefits explained, specific risks discussed. Consent was given by the patient. Immediately prior to procedure a time out was called to verify the correct patient, procedure, equipment, support staff and site/side marked as required. Patient was prepped and draped in the usual sterile fashion.     - I evaluated the patient about 5 minutes post-injection and she had significant improvement in pain and range of motion following this - follow-up with Dr. Roda Shutters as indicated; I am happy to see them as needed  Madelyn Brunner, DO Primary Care Sports Medicine Physician  [facility] - Orthopedics  This note was dictated using Dragon naturally speaking software and may contain errors in syntax, spelling, or content which have not been identified prior to signing this  note.
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
## 2023-03-19 ENCOUNTER — Other Ambulatory Visit: Payer: Self-pay | Admitting: Physician Assistant

## 2023-03-19 ENCOUNTER — Telehealth: Payer: Self-pay

## 2023-03-19 MED ORDER — HYDROCODONE-ACETAMINOPHEN 5-325 MG PO TABS: 1.0000 | ORAL_TABLET | Freq: Four times a day (QID) | ORAL | 0 refills | Status: DC | PRN

## 2023-03-19 NOTE — Telephone Encounter (Signed)
Pt called and states that she was in the office yesterday and had an injection of the right hip. Since the pain has increased and she was not able to sleep and is asking for rx for pain medication. Cb [phone] uses the [employer] allergic to MS04. Advised I would send a message and assistant would call back to advise instructions.

## 2023-03-19 NOTE — Telephone Encounter (Signed)
Please advise on message below.

## 2023-03-23 ENCOUNTER — Encounter: Payer: Self-pay | Admitting: Sports Medicine

## 2023-03-25 ENCOUNTER — Ambulatory Visit: Payer: Medicare HMO | Admitting: Orthopaedic Surgery

## 2023-03-29 ENCOUNTER — Other Ambulatory Visit: Payer: Self-pay | Admitting: Family Medicine

## 2023-03-29 DIAGNOSIS — I10 Essential (primary) hypertension: Secondary | ICD-10-CM

## 2023-04-01 ENCOUNTER — Ambulatory Visit: Payer: Medicare HMO | Admitting: Orthopaedic Surgery

## 2023-04-05 ENCOUNTER — Telehealth: Payer: Self-pay | Admitting: Pharmacy Technician

## 2023-04-05 ENCOUNTER — Other Ambulatory Visit (HOSPITAL_COMMUNITY): Payer: Self-pay

## 2023-04-05 ENCOUNTER — Ambulatory Visit: Payer: Medicare HMO | Attending: Internal Medicine | Admitting: Pharmacist

## 2023-04-05 ENCOUNTER — Encounter: Payer: Self-pay | Admitting: Pharmacist

## 2023-04-05 DIAGNOSIS — E785 Hyperlipidemia, unspecified: Secondary | ICD-10-CM | POA: Diagnosis not present

## 2023-04-05 MED ORDER — REPATHA SURECLICK 140 MG/ML ~~LOC~~ SOAJ: 1.0000 mL | SUBCUTANEOUS | 11 refills | Status: DC

## 2023-04-05 NOTE — Telephone Encounter (Signed)
-----   Message from Olene Floss sent at 04/05/2023 11:22 AM EDT ----- Please submit PA for Repatha. Hx of TIA, CAD on CT, on pravastatin 20, LDL-C 148 intolerant to rosuvastatin and atrovastatin.

## 2023-04-05 NOTE — Telephone Encounter (Signed)
Patient called stating that she remembered that she had a friend who get DM from Lipitor. She said she googled it and said it could cause DM. She wants to know if Repatha can do this. I explained that there was a numerical but not statistical increased risk of new onset DM with Repatha. Talked about the difference ~ 1%/ Also reviewed that statins can increase risk ~1-2% but the benefit of CV reduction outweigh that risk.  Patient states that she has a family hx of DM and she doesn't want to get DM and wont take something that can cause her to have DM. She said DM will give her heart disease. Advised that it can increase her risk yes, but so can high cholesterol. Reviewed that there is a lot of things that can be done with her diet and lifestyle to decrease her risk of DM We did talk about Nexlizet. She would like me to send her the drug name and she will let me know.

## 2023-04-05 NOTE — Telephone Encounter (Signed)
Patient made aware of approval. Rx sent to pharmacy and pt scheduled for labs 11/15

## 2023-04-05 NOTE — Telephone Encounter (Signed)
Pharmacy Patient Advocate Encounter  Received notification from [facility] that Prior Authorization for repatha has been APPROVED from 07/20/22 to 07/20/23. Ran test claim, Copay is $45.00. This test claim was processed through [facility]- copay amounts may vary at other pharmacies due to pharmacy/plan contracts, or as the patient moves through the different stages of their insurance plan.   PA #/Case ID/Reference #: [id number]

## 2023-04-05 NOTE — Assessment & Plan Note (Signed)
Assessment: LDL-C is above goal of <70 Patient is intolerant to rosuvastatin and atorvastatin (doesn't know doses) Tolerating pravastatin 20mg  well but LDL-C is still way above goal No exercise Fried chicken biscuit almost daily for breakfast No ETOH, tobacco and BP well controlled Reviewed options for lowering LDL cholesterol, including ezetimibe, PCSK-9 inhibitors, bempedoic acid or low dose rosuvastatin vs increase in pravastatin dose.  Discussed mechanisms of action, dosing, side effects and potential decreases in LDL cholesterol.  Also reviewed cost information. Family hx of ASCVD Personal hx of TIA and coronary artery calcification  Plan: Will submit PA for Repatha Encouraged exercise  Decrease fried foods and processed meats Patient unsure about deductible. Will run test claim after PA is competed.

## 2023-04-05 NOTE — Addendum Note (Signed)
Addended by: Malena Peer D on: 04/05/2023 12:24 PM   Modules accepted: Orders

## 2023-04-05 NOTE — Telephone Encounter (Signed)
Pharmacy Patient Advocate Encounter   Received notification from  [facility] charts  that prior authorization for repatha is required/requested.   Insurance verification completed.   The patient is insured through [employer] .   Per test claim: PA required; PA submitted to [facility] via CoverMyMeds Key/confirmation #/EOC [id number] Status is pending

## 2023-04-05 NOTE — Progress Notes (Signed)
Patient ID: Kelly Wilson                 DOB: 1956/05/26                    MRN: [id number]      HPI: Kelly Wilson is a 67 y.o. female patient referred to lipid clinic by Dr. Clifton James. PMH is significant for GERD, HTN, hyperlipidemia, thyroid disease, spinal stenosis and IC bleeding/CVA in 2017, prior TIA. Seen by Dr. Clifton James 12/09/22. Was referred after CT scan showed coronary and aortic atherosclerosis. Was on pravastatin 10mg  as she has been intolerant to rosuvastatin and atorvastatin. Pravastatin increased to 20mg  but LDL-C still well above goal. Patient opted for referral to lipid clinic.  Patient presents today to clinic. She reports she has a TIA about 15-20 years ago. She also had hemorrhagic stroke in 2017. She has a family hx of ASCVD. Mother had CABG in her 39's and was also a diabetic. Her father had CABG at age 80 but lived until 35. Thinks he died of an MI.  We discussed her risk factors for another ASCVD event. She has HTN that is well controlled. Last A1C 2 years ago was 5.3. Discussed having PCP check this again at next physical. Use to be CKD stage III but most recent GFR is >60. She reports a few years ago taking a lot of NSAIDs for hip pain. Does not do this anymore.  No exercise and eats fast food for breakfast. Trying to cut back on processed foods. We discussed that her TG were elevated on labs prior to starting statin. Discussed limiting refined grains like biscuits, white pastas, white rice. Increase vegetables. 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 She does stretch daily.   Reviewed options for lowering LDL cholesterol, including ezetimibe, PCSK-9 inhibitors, bempedoic acid or low dose rosuvastatin vs increase in pravastatin dose.  Discussed mechanisms of action, dosing, side effects and potential decreases in LDL cholesterol.  Also reviewed cost information.  Current Medications: pravastatin  20mg  daily Intolerances: rosuvastatin, atorvastatin (flu like reaction within 2 weeks) Risk Factors: age, HTN, prior TIA LDL-C goal: <70 ApoB goal:   Diet:  Breakfast: 1/2 biscuit and chicken from chick fil A Lunch: chef salad, just a baked potato w/ butter w/ grilled burger or chicken, broccoli Drink: def unsweet tea w/ splenda, water Snack: PB crackers  Exercise: none  Family History:  Family History  Problem Relation Age of Onset   Coronary artery disease Mother    Diabetes Mother    High blood pressure Mother    Coronary artery disease Father        Dies age 63 MI   Diabetes Brother    High blood pressure Brother    Diabetes Brother    High blood pressure Brother    Diabetes Maternal Grandmother    Colon cancer Maternal Uncle    Cancer Other        uncle had rectal cancer   Hypertension Other    Hyperlipidemia Other      Social History: no tobacco, no ETOH  Labs: Lipid Panel     Component Value Date/Time   CHOL 256 (H) 03/03/2023 0737   TRIG 133 03/03/2023 0737   HDL 85 03/03/2023 0737   CHOLHDL 3.0 03/03/2023 0737   CHOLHDL 3 10/13/2022 1030   VLDL 39.2 10/13/2022 1030   LDLCALC 148 (H) 03/03/2023 0737  LDLCALC 133 (H) 03/21/2020 0820   LDLDIRECT 171.2 08/03/2013 0754   LABVLDL 23 03/03/2023 0737    Past Medical History:  Diagnosis Date   GERD 08/09/2008   GOITER, MULTINODULAR 05/28/2008   Hyperlipidemia    HYPERLIPIDEMIA 05/17/2007   Hypertension    HYPERTENSION 05/17/2007   Left ear hearing loss    Mild   Muscle cramps 11/14/2014   Muscle twitching    Myalgia 11/14/2014   OSTEOARTHRITIS 05/17/2007   Pancreatitis    SPINAL STENOSIS 05/17/2007   Stroke (HCC)    TRANSIENT ISCHEMIC ATTACK, HX OF 05/17/2007    Current Outpatient Medications on File Prior to Visit  Medication Sig Dispense Refill   HYDROcodone-acetaminophen (NORCO/VICODIN) 5-325 MG tablet Take 1 tablet by mouth every 6 (six) hours as needed for moderate pain. 10 tablet 0    alendronate (FOSAMAX) 70 MG tablet TAKE 1 TABLET(70 MG) BY MOUTH EVERY 7 DAYS WITH A FULL GLASS OF WATER AND ON AN EMPTY STOMACH 12 tablet 2   amoxicillin (AMOXIL) 500 MG capsule Take 4 pills one hour prior to dental work 12 capsule 2   Cholecalciferol (VITAMIN D) 50 MCG (2000 UT) tablet Take 2,000 Units by mouth daily.     clonazePAM (KLONOPIN) 0.5 MG tablet TAKE 1 TABLET(0.5 MG) BY MOUTH DAILY AS NEEDED FOR ANXIETY 20 tablet 1   losartan-hydrochlorothiazide (HYZAAR) 100-12.5 MG tablet TAKE 1 TABLET BY MOUTH DAILY 90 tablet 2   Magnesium 125 MG CAPS Take 125 mg by mouth in the morning and at bedtime.     pravastatin (PRAVACHOL) 20 MG tablet Take 1 tablet (20 mg total) by mouth every evening. 90 tablet 3   vitamin C (ASCORBIC ACID) 250 MG tablet Take 250 mg by mouth daily.     zinc gluconate 50 MG tablet Take 50 mg by mouth daily.     No current facility-administered medications on file prior to visit.    Allergies  Allergen Reactions   Morphine Hives   Lipitor [Atorvastatin Calcium]    Rosuvastatin Other (See Comments)    myalgia    Assessment/Plan:  1. Hyperlipidemia -  Hyperlipidemia Assessment: LDL-C is above goal of <70 Patient is intolerant to rosuvastatin and atorvastatin (doesn't know doses) Tolerating pravastatin 20mg  well but LDL-C is still way above goal No exercise Fried chicken biscuit almost daily for breakfast No ETOH, tobacco and BP well controlled Reviewed options for lowering LDL cholesterol, including ezetimibe, PCSK-9 inhibitors, bempedoic acid or low dose rosuvastatin vs increase in pravastatin dose.  Discussed mechanisms of action, dosing, side effects and potential decreases in LDL cholesterol.  Also reviewed cost information. Family hx of ASCVD Personal hx of TIA and coronary artery calcification  Plan: Will submit PA for Repatha Encouraged exercise  Decrease fried foods and processed meats Patient unsure about deductible. Will run test claim after PA  is competed.    Thank you,  Olene Floss, Pharm.D, BCACP, BCPS, CPP [facility] [address] [place], [place] [address]  Phone: [phone]; Fax: [phone]

## 2023-04-05 NOTE — Patient Instructions (Signed)
I will submit a prior authorization for Repatha. I will call you once I hear back. Please call me at [phone] with any questions.   Repatha is a cholesterol medication that improved your body's ability to get rid of "bad cholesterol" known as LDL. It can lower your LDL up to 60%! It is an injection that is given under the skin every 2 weeks. The medication often requires a prior authorization from your insurance company. We will take care of submitting all the necessary information to your insurance company to get it approved. The most common side effects of Repatha include runny nose, symptoms of the common cold, rarely flu or flu-like symptoms, back/muscle pain in about 3-4% of the patients, and redness, pain, or bruising at the injection site. Tell your healthcare provider if you have any side effect that bothers you or that does not go away.

## 2023-04-06 DIAGNOSIS — H524 Presbyopia: Secondary | ICD-10-CM | POA: Diagnosis not present

## 2023-04-06 DIAGNOSIS — H04123 Dry eye syndrome of bilateral lacrimal glands: Secondary | ICD-10-CM | POA: Diagnosis not present

## 2023-04-06 DIAGNOSIS — H5203 Hypermetropia, bilateral: Secondary | ICD-10-CM | POA: Diagnosis not present

## 2023-04-06 DIAGNOSIS — H2513 Age-related nuclear cataract, bilateral: Secondary | ICD-10-CM | POA: Diagnosis not present

## 2023-04-10 DIAGNOSIS — Z0100 Encounter for examination of eyes and vision without abnormal findings: Secondary | ICD-10-CM | POA: Diagnosis not present

## 2023-04-13 ENCOUNTER — Other Ambulatory Visit: Payer: Self-pay | Admitting: Family Medicine

## 2023-04-21 ENCOUNTER — Ambulatory Visit (HOSPITAL_BASED_OUTPATIENT_CLINIC_OR_DEPARTMENT_OTHER): Payer: Medicare HMO

## 2023-04-21 ENCOUNTER — Ambulatory Visit (HOSPITAL_BASED_OUTPATIENT_CLINIC_OR_DEPARTMENT_OTHER): Payer: Medicare HMO | Admitting: Student

## 2023-04-21 ENCOUNTER — Other Ambulatory Visit (HOSPITAL_BASED_OUTPATIENT_CLINIC_OR_DEPARTMENT_OTHER): Payer: Self-pay

## 2023-04-21 DIAGNOSIS — M1711 Unilateral primary osteoarthritis, right knee: Secondary | ICD-10-CM | POA: Diagnosis not present

## 2023-04-21 DIAGNOSIS — M47814 Spondylosis without myelopathy or radiculopathy, thoracic region: Secondary | ICD-10-CM | POA: Diagnosis not present

## 2023-04-21 DIAGNOSIS — M25561 Pain in right knee: Secondary | ICD-10-CM

## 2023-04-21 DIAGNOSIS — S299XXA Unspecified injury of thorax, initial encounter: Secondary | ICD-10-CM | POA: Diagnosis not present

## 2023-04-21 DIAGNOSIS — M549 Dorsalgia, unspecified: Secondary | ICD-10-CM

## 2023-04-21 MED ORDER — METHOCARBAMOL 500 MG PO TABS
500.0000 mg | ORAL_TABLET | Freq: Four times a day (QID) | ORAL | 0 refills | Status: AC | PRN
  Filled 2023-04-21: qty 40, 10d supply, fill #0
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
## 2023-05-06 ENCOUNTER — Ambulatory Visit: Payer: Medicare HMO | Admitting: Orthopaedic Surgery

## 2023-06-04 ENCOUNTER — Ambulatory Visit: Payer: Medicare HMO | Attending: Cardiovascular Disease

## 2023-06-04 DIAGNOSIS — E785 Hyperlipidemia, unspecified: Secondary | ICD-10-CM

## 2023-06-05 LAB — LIPID PANEL
Chol/HDL Ratio: 2.8 ratio (ref 0.0–4.4)
Cholesterol, Total: 236 mg/dL — ABNORMAL HIGH (ref 100–199)
HDL: 85 mg/dL (ref >39)
LDL Chol Calc (NIH): 130 mg/dL — ABNORMAL HIGH (ref 0–99)
Triglycerides: 125 mg/dL (ref 0–149)
VLDL Cholesterol Cal: 21 mg/dL (ref 5–40)

## 2023-06-07 ENCOUNTER — Telehealth: Payer: Self-pay | Admitting: Pharmacist

## 2023-06-07 DIAGNOSIS — E785 Hyperlipidemia, unspecified: Secondary | ICD-10-CM

## 2023-06-07 NOTE — Telephone Encounter (Signed)
Patient seen in lipid clinic a few months ago. Initially agreeable to trying Repatha but then decided she did not want to try it due to very low risk of new onset DM. She did not start any therapy. Labs slightly better, but still above goal of <70.  Had also discussed nexlizet. LVM for pt to call back to discuss

## 2023-06-15 NOTE — Telephone Encounter (Signed)
Discussed labs with patient. No interested in medications. She has cut back on fried foods, switched to whole wheat bread, cut out processed meats. Needs to add in exercise. Wants to continue to add in exercise and work on diet and recheck in 3 months.

## 2023-07-01 ENCOUNTER — Other Ambulatory Visit: Payer: Self-pay | Admitting: Family Medicine

## 2023-09-27 ENCOUNTER — Other Ambulatory Visit: Payer: Self-pay

## 2023-09-27 MED ORDER — PRAVASTATIN SODIUM 20 MG PO TABS: 20.0000 mg | ORAL_TABLET | Freq: Every evening | ORAL | 0 refills | Status: DC

## 2023-10-07 DIAGNOSIS — Z1211 Encounter for screening for malignant neoplasm of colon: Secondary | ICD-10-CM | POA: Diagnosis not present

## 2023-10-07 DIAGNOSIS — E785 Hyperlipidemia, unspecified: Secondary | ICD-10-CM | POA: Diagnosis not present

## 2023-10-07 DIAGNOSIS — Z800 Family history of malignant neoplasm of digestive organs: Secondary | ICD-10-CM | POA: Diagnosis not present

## 2023-10-07 DIAGNOSIS — I10 Essential (primary) hypertension: Secondary | ICD-10-CM | POA: Diagnosis not present

## 2023-10-11 ENCOUNTER — Other Ambulatory Visit: Payer: Self-pay | Admitting: Family Medicine

## 2023-10-11 DIAGNOSIS — I10 Essential (primary) hypertension: Secondary | ICD-10-CM

## 2023-10-15 ENCOUNTER — Encounter: Payer: Self-pay | Admitting: Family Medicine

## 2023-10-15 ENCOUNTER — Encounter: Admitting: Family Medicine

## 2023-10-15 ENCOUNTER — Ambulatory Visit: Admitting: Family Medicine

## 2023-10-15 VITALS — BP 130/86 | HR 62 | Temp 98.8°F | Resp 16 | Ht 66.0 in | Wt 231.2 lb

## 2023-10-15 DIAGNOSIS — E785 Hyperlipidemia, unspecified: Secondary | ICD-10-CM | POA: Diagnosis not present

## 2023-10-15 DIAGNOSIS — E042 Nontoxic multinodular goiter: Secondary | ICD-10-CM

## 2023-10-15 DIAGNOSIS — R252 Cramp and spasm: Secondary | ICD-10-CM | POA: Diagnosis not present

## 2023-10-15 DIAGNOSIS — N1831 Chronic kidney disease, stage 3a: Secondary | ICD-10-CM

## 2023-10-15 DIAGNOSIS — I10 Essential (primary) hypertension: Secondary | ICD-10-CM

## 2023-10-15 DIAGNOSIS — Z0000 Encounter for general adult medical examination without abnormal findings: Secondary | ICD-10-CM

## 2023-10-15 DIAGNOSIS — F419 Anxiety disorder, unspecified: Secondary | ICD-10-CM

## 2023-10-15 DIAGNOSIS — G911 Obstructive hydrocephalus: Secondary | ICD-10-CM

## 2023-10-15 DIAGNOSIS — M816 Localized osteoporosis [Lequesne]: Secondary | ICD-10-CM | POA: Diagnosis not present

## 2023-10-15 LAB — BASIC METABOLIC PANEL WITH GFR
BUN: 17 mg/dL (ref 6–23)
CO2: 28 meq/L (ref 19–32)
Calcium: 9.5 mg/dL (ref 8.4–10.5)
Chloride: 103 meq/L (ref 96–112)
Creatinine, Ser: 0.88 mg/dL (ref 0.40–1.20)
GFR: 66.72 mL/min (ref >60.00)
Glucose, Bld: 94 mg/dL (ref 70–99)
Potassium: 4.1 meq/L (ref 3.5–5.1)
Sodium: 139 meq/L (ref 135–145)

## 2023-10-15 LAB — LIPID PANEL
Cholesterol: 229 mg/dL — ABNORMAL HIGH (ref 0–200)
HDL: 75.1 mg/dL (ref >39.00)
LDL Cholesterol: 124 mg/dL — ABNORMAL HIGH (ref 0–99)
NonHDL: 154.16
Total CHOL/HDL Ratio: 3
Triglycerides: 153 mg/dL — ABNORMAL HIGH (ref 0.0–149.0)
VLDL: 30.6 mg/dL (ref 0.0–40.0)

## 2023-10-15 LAB — TSH: TSH: 5.11 u[IU]/mL (ref 0.35–5.50)

## 2023-10-15 LAB — MAGNESIUM: Magnesium: 2 mg/dL (ref 1.5–2.5)

## 2023-10-15 MED ORDER — CLONAZEPAM 0.5 MG PO TABS: ORAL_TABLET | ORAL | 2 refills | Status: AC

## 2023-10-15 NOTE — Assessment & Plan Note (Addendum)
 Last DEXA in 06/2020. For now continue Fosamax 70 mg weekly to complete 5 years. Bone density ordered. Continue adequate calcium and vitamin D intake as well as fall prevention and regular physical activity.
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
## 2023-10-15 NOTE — Assessment & Plan Note (Signed)
 S/P partial thyroidectomy, right-sided nodule. TSH has been in normal range, 3.8 in 09/2022. Will continue monitoring annual TSH.

## 2023-10-15 NOTE — Patient Instructions (Addendum)
 A few things to remember from today's visit:  Routine general medical examination at a health care facility  Hyperlipidemia, unspecified hyperlipidemia type - Plan: Lipid panel  GOITER, MULTINODULAR - Plan: TSH  Localized osteoporosis without current pathological fracture - Plan: DEXAScan  Leg cramping - Plan: Basic metabolic panel with GFR, Magnesium  If you need refills for medications you take chronically, please call your pharmacy. Do not use My Chart to request refills or for acute issues that need immediate attention. If you send a my chart message, it may take a few days to be addressed, specially if I am not in the office.  Please be sure medication list is accurate. If a new problem present, please set up appointment sooner than planned today.
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
## 2023-10-15 NOTE — Assessment & Plan Note (Signed)
 For the past 2 to 3 years creatinine has been 0.7-1.0 and EGFR high 50s, in 09/2022 it was 67. Continue adequate hydration, low-salt diet, and avoidance of NSAIDs.

## 2023-10-15 NOTE — Assessment & Plan Note (Addendum)
 This problem has been going on for years. We discussed some possible etiologies. Lately she tried  Gatorade as needed and it seems to be helping. Further recommendation will be given according to lab results.

## 2023-10-15 NOTE — Assessment & Plan Note (Addendum)
 Problem is stable, she does not take clonazepam frequently, 20 tablets with 2 refills usually last a year, last filled on 07/02/2023. Continue annual follow-up, before if needed.Marland Kitchen

## 2023-10-15 NOTE — Assessment & Plan Note (Signed)
 We discussed the importance of regular physical activity and healthy diet for prevention of chronic illness and/or complications. Preventive guidelines reviewed. Vaccination up today. Ca++ and vit D supplementation to continue. Next CPE in a year.

## 2023-10-15 NOTE — Assessment & Plan Note (Signed)
Continue pravastatin 20 mg daily and low-fat diet. Further recommendation will be given according to lipid panel result. 

## 2023-10-15 NOTE — Assessment & Plan Note (Signed)
 Overall BP is adequately controlled, it has been 120's/70's during cardiologist's OV. Monitor BP at home. For now continue losartan-HCTZ 100-12.5 mg daily and low-fat diet. As far as problem is a stable continue annual follow-ups.

## 2023-10-18 NOTE — Telephone Encounter (Signed)
 She's tried Rosuvastatin in the past and had myalgia from it.

## 2023-10-30 ENCOUNTER — Other Ambulatory Visit: Payer: Self-pay | Admitting: Family Medicine

## 2023-11-22 ENCOUNTER — Encounter: Payer: Self-pay | Admitting: Family Medicine

## 2023-11-22 MED ORDER — PRAVASTATIN SODIUM 40 MG PO TABS: 40.0000 mg | ORAL_TABLET | Freq: Every day | ORAL | 3 refills | Status: AC

## 2023-12-24 ENCOUNTER — Other Ambulatory Visit (INDEPENDENT_AMBULATORY_CARE_PROVIDER_SITE_OTHER): Payer: Self-pay

## 2023-12-24 ENCOUNTER — Ambulatory Visit: Admitting: Physician Assistant

## 2023-12-24 DIAGNOSIS — M1811 Unilateral primary osteoarthritis of first carpometacarpal joint, right hand: Secondary | ICD-10-CM | POA: Diagnosis not present

## 2023-12-24 DIAGNOSIS — G8929 Other chronic pain: Secondary | ICD-10-CM | POA: Diagnosis not present

## 2023-12-24 DIAGNOSIS — M79644 Pain in right finger(s): Secondary | ICD-10-CM

## 2023-12-24 MED ORDER — METHYLPREDNISOLONE ACETATE 40 MG/ML IJ SUSP
13.3300 mg | INTRAMUSCULAR | Status: AC | PRN
  Administered 2023-12-24: 13.33 mg

## 2023-12-24 MED ORDER — LIDOCAINE HCL 1 % IJ SOLN
3.0000 mL | INTRAMUSCULAR | Status: AC | PRN
  Administered 2023-12-24: 3 mL

## 2023-12-24 MED ORDER — TRAMADOL HCL 50 MG PO TABS: 50.0000 mg | ORAL_TABLET | Freq: Two times a day (BID) | ORAL | 2 refills | Status: DC | PRN

## 2023-12-24 MED ORDER — BUPIVACAINE HCL 0.25 % IJ SOLN
0.3300 mL | INTRAMUSCULAR | Status: AC | PRN
  Administered 2023-12-24: .33 mL
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
## 2023-12-30 ENCOUNTER — Telehealth: Payer: Self-pay | Admitting: Physician Assistant

## 2023-12-30 ENCOUNTER — Encounter: Payer: Self-pay | Admitting: Family Medicine

## 2023-12-30 NOTE — Telephone Encounter (Signed)
 Pt is needing to know if she'll need any premed antibiotics before her dental work because of her hip surgery.

## 2024-01-03 MED ORDER — LEVOTHYROXINE SODIUM 25 MCG PO TABS: 25.0000 ug | ORAL_TABLET | Freq: Every day | ORAL | 2 refills | Status: DC

## 2024-01-05 ENCOUNTER — Telehealth: Payer: Self-pay | Admitting: Orthopaedic Surgery

## 2024-01-05 NOTE — Telephone Encounter (Signed)
 Patient called and said she needs to get dental treatment and her doctor wants something in writing that is saying she is ok to get it. CB#[phone]

## 2024-01-05 NOTE — Telephone Encounter (Signed)
 Note created. Patient had surgery in November of 2022. No need to be premedicated for dental procedures anymore. Note was sent to patient's MyChart as requested.

## 2024-01-06 ENCOUNTER — Telehealth: Payer: Self-pay | Admitting: Orthopaedic Surgery

## 2024-01-06 NOTE — Telephone Encounter (Signed)
Letter has been emailed to address below.

## 2024-01-06 NOTE — Telephone Encounter (Signed)
 Patient called and ask if you can resend the note from yesterday or you can send to her email [email or web address] [phone]

## 2024-02-11 ENCOUNTER — Encounter: Payer: Self-pay | Admitting: Family Medicine

## 2024-02-11 ENCOUNTER — Ambulatory Visit (INDEPENDENT_AMBULATORY_CARE_PROVIDER_SITE_OTHER): Admitting: Family Medicine

## 2024-02-11 ENCOUNTER — Ambulatory Visit

## 2024-02-11 ENCOUNTER — Telehealth: Payer: Self-pay

## 2024-02-11 VITALS — BP 128/80 | HR 82 | Temp 98.1°F | Resp 16 | Ht 66.0 in | Wt 237.0 lb

## 2024-02-11 DIAGNOSIS — R059 Cough, unspecified: Secondary | ICD-10-CM | POA: Diagnosis not present

## 2024-02-11 DIAGNOSIS — R053 Chronic cough: Secondary | ICD-10-CM

## 2024-02-11 DIAGNOSIS — R062 Wheezing: Secondary | ICD-10-CM

## 2024-02-11 MED ORDER — BUDESONIDE-FORMOTEROL FUMARATE 160-4.5 MCG/ACT IN AERO: 2.0000 | INHALATION_SPRAY | Freq: Two times a day (BID) | RESPIRATORY_TRACT | 0 refills | Status: DC

## 2024-02-11 MED ORDER — ALBUTEROL SULFATE HFA 108 (90 BASE) MCG/ACT IN AERS: 2.0000 | INHALATION_SPRAY | Freq: Four times a day (QID) | RESPIRATORY_TRACT | 0 refills | Status: DC | PRN

## 2024-02-11 MED ORDER — PREDNISONE 20 MG PO TABS
40.0000 mg | ORAL_TABLET | Freq: Every day | ORAL | 0 refills | Status: AC
Start: 2024-02-11 — End: 2024-02-14

## 2024-02-11 NOTE — Telephone Encounter (Signed)
 I left pt a voicemail with the information below.

## 2024-02-11 NOTE — Telephone Encounter (Signed)
 She can try just Albuterol inh, 2 puff every 6 hours for a week then as needed for wheezing or shortness of breath/wheezing. I am going to sent Rx. Thanks, BJ.

## 2024-02-11 NOTE — Addendum Note (Signed)
 Addended by: Vinny Taranto G on: 02/11/2024 02:15 PM   Modules accepted: Orders

## 2024-02-11 NOTE — Telephone Encounter (Signed)
 Pt called back & is aware new Rx has been sent in.

## 2024-02-11 NOTE — Telephone Encounter (Signed)
 No assistance program for Symibcort available at this time.  Only inhaler that has assistance for medicare patients right now is Clinical cytogeneticist. Attempted to contact patient to see if she would meet program requirements, had to leave a voicemail.  Symbicort is costly because patient has a $250 deductible to meet. Cost would go down to $47 per month once that is met if she stays on inhaler.  Wixela is showing as the cheapest equal alternative at $99 per month for first month. Other alternative would be to send in generic albuterol hfa inhaler, insurance shows would be $44.

## 2024-02-11 NOTE — Progress Notes (Addendum)
 ACUTE VISIT Chief Complaint  Patient presents with   Cough    X 4 weeks, productive    HPI: [name] is a 68 y.o. female, who is here today complaining of a persistent cough ongoing 4 weeks.  Her cough is productive with green sputum.  She endorses occasional wheezing; last heard about 2 days ago.  Cough This is a new problem. The current episode started 1 to 4 weeks ago. The problem has been unchanged. The problem occurs every few hours. The cough is Productive of sputum. Pertinent negatives include no chest pain, chills, ear congestion, ear pain, headaches, heartburn, hemoptysis, myalgias, nasal congestion, postnasal drip, rash, rhinorrhea, sore throat, shortness of breath, sweats or weight loss. Nothing aggravates the symptoms. She has tried OTC cough suppressant for the symptoms. The treatment provided mild relief. There is no history of asthma, COPD or environmental allergies.   She was taking Mucinex about 3-4 weeks ago, but stopped after no improvement.  Currently, she is only taking cough drops.  She notes she initially when cough started she had other symptoms, URI, that it was not bad. States that she just missed one day of work due to cough, chills, and mild fever at onset of sx. All these symptoms resolved.  Overall she recovered quickly, but her cough has persisted with no improvement.   Negative for fever, chills, abnormal weight loss, heartburn, orthopnea,PND,nausea, or edema.  Review of Systems  Constitutional:  Negative for activity change, appetite change, chills and weight loss.  HENT:  Negative for ear pain, postnasal drip, rhinorrhea, sore throat and trouble swallowing.   Respiratory:  Positive for cough. Negative for hemoptysis and shortness of breath.   Cardiovascular:  Negative for chest pain.  Gastrointestinal:  Negative for abdominal pain, heartburn and vomiting.  Genitourinary:  Negative for decreased urine volume, dysuria and hematuria.   Musculoskeletal:  Negative for myalgias.  Skin:  Negative for rash.  Allergic/Immunologic: Negative for environmental allergies.  Neurological:  Negative for syncope and headaches.  See other pertinent positives and negatives in HPI.  Current Outpatient Medications on File Prior to Visit  Medication Sig Dispense Refill   alendronate  (FOSAMAX ) 70 MG tablet TAKE 1 TABLET(70 MG) BY MOUTH EVERY 7 DAYS WITH A FULL GLASS OF WATER  AND ON AN EMPTY STOMACH 12 tablet 2   Cholecalciferol (VITAMIN D ) 50 MCG (2000 UT) tablet Take 2,000 Units by mouth daily.     clonazePAM  (KLONOPIN ) 0.5 MG tablet TAKE 1 TABLET(0.5 MG) BY MOUTH DAILY AS NEEDED FOR ANXIETY 20 tablet 2   levothyroxine  (SYNTHROID ) 25 MCG tablet Take 1 tablet (25 mcg total) by mouth daily. 30 tablet 2   losartan -hydrochlorothiazide  (HYZAAR) 100-12.5 MG tablet TAKE 1 TABLET BY MOUTH DAILY 90 tablet 2   Magnesium  125 MG CAPS Take 125 mg by mouth in the morning and at bedtime.     pravastatin  (PRAVACHOL ) 40 MG tablet Take 1 tablet (40 mg total) by mouth daily. 90 tablet 3   traMADol  (ULTRAM ) 50 MG tablet Take 1 tablet (50 mg total) by mouth every 12 (twelve) hours as needed. 30 tablet 2   vitamin C (ASCORBIC ACID) 250 MG tablet Take 250 mg by mouth daily.     zinc  gluconate 50 MG tablet Take 50 mg by mouth daily.     No current facility-administered medications on file prior to visit.    Past Medical History:  Diagnosis Date   GERD 08/09/2008   GOITER, MULTINODULAR 05/28/2008   Hyperlipidemia  HYPERLIPIDEMIA 05/17/2007   Hypertension    HYPERTENSION 05/17/2007   Left ear hearing loss    Mild   Muscle cramps 11/14/2014   Muscle twitching    Myalgia 11/14/2014   OSTEOARTHRITIS 05/17/2007   Pancreatitis    SPINAL STENOSIS 05/17/2007   Stroke (HCC)    TRANSIENT ISCHEMIC ATTACK, HX OF 05/17/2007   Allergies  Allergen Reactions   Morphine Hives   Lipitor [Atorvastatin Calcium]    Rosuvastatin Other (See Comments)    myalgia     Social History   Socioeconomic History   Marital status: Widowed    Spouse name: Not on file   Number of children: 2   Years of education: Not on file   Highest education level: Not on file  Occupational History   Occupation: Veterinary surgeon   Occupation: She works as a Catering manager  Tobacco Use   Smoking status: Never   Smokeless tobacco: Never   Tobacco comments:    Married, she is a Designer, 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  Substance and Sexual Activity   Alcohol use: No   Drug use: No   Sexual activity: Not on file  Other Topics Concern   Not on file  Social History Narrative   Married, Veterinary surgeon, 2 daughters   Caffeine use: tea daily   Social Drivers of Corporate investment banker Strain: Not on file  Food Insecurity: Not on file  Transportation Needs: No Transportation Needs (04/30/2022)   PRAPARE - Administrator, Civil Service (Medical): No    Lack of Transportation (Non-Medical): No  Physical Activity: Not on file  Stress: Not on file  Social Connections: Not on file   Vitals:   02/11/24 0836  BP: 128/80  Pulse: 82  Resp: 16  Temp: 98.1 F (36.7 C)  SpO2: 96%   Body mass index is 38.25 kg/m.  Physical Exam Vitals and nursing note reviewed.  Constitutional:      General: She is not in acute distress.    Appearance: She is well-developed.  HENT:     Head: Normocephalic and atraumatic.     Mouth/Throat:     Mouth: Mucous membranes are moist.     Pharynx: Oropharynx is clear.  Eyes:     Conjunctiva/sclera: Conjunctivae normal.  Cardiovascular:     Rate and Rhythm: Normal rate and regular rhythm.     Pulses:          Dorsalis pedis pulses are 2+ on the right side and 2+ on the left side.     Heart sounds: No murmur heard. Pulmonary:     Effort: Pulmonary effort is normal. No respiratory distress.     Breath sounds: Normal breath sounds.  Chest:     Comments: Right rib cage tenderness with palpation, no masses or deformities  appreciated. Abdominal:     Palpations: Abdomen is soft. There is no hepatomegaly or mass.     Tenderness: There is no abdominal tenderness.  Lymphadenopathy:     Cervical: No cervical adenopathy.  Skin:    General: Skin is warm.     Findings: No erythema or rash.  Neurological:     General: No focal deficit present.     Mental Status: She is alert and oriented to person, place, and time.     Gait: Gait normal.  Psychiatric:        Mood and Affect: Mood and affect normal.    ASSESSMENT AND PLAN: Ms. Kelly Marsalis  Wilson was seen today for persistent cough.  Persistent cough for 3 weeks or longer We discussed possible etiologies, ? RAD. Lung auscultation negative today. Chest x-ray ordered. Recommend OTC plain Mucinex. Monitor for new symptoms. If problem is not greatly improved in 3 to 4 weeks, we can try a course of PPI.  -     DG Chest 2 View; Future  Wheezing Reported by patient, last noted 2 days ago.  No history of asthma or tobacco use. Recommend Symbicort 160-4.5 mcg twice daily for 4 weeks, instructed to rinse after use. No hx allergies to Prednisone , she has taken it before, however, she notes it tends to change my voice. After discussion of some side effects, she agrees with trying prednisone  40 mg daily with breakfast for 3 days.  -     Budesonide-Formoterol Fumarate; Inhale 2 puffs into the lungs 2 (two) times daily.  Dispense: 1 each; Refill: 0 -     predniSONE ; Take 2 tablets (40 mg total) by mouth daily with breakfast for 3 days.  Dispense: 6 tablet; Refill: 0  Addendum: Symbicort is not covered by her insurance.  Prescription changed to albuterol inhaler, 2 puffs every 6 hours for a week then as needed.  Return if symptoms worsen or fail to improve.  I, Vernell Forest, acting as a scribe for Ajani Rineer [place], MD., have documented all relevant documentation on the behalf of Kelly Slomski [place], MD, as directed by   while in the presence of Kelly Momon [place], MD.  I, Montrell Cessna  [place], MD, have reviewed all documentation for this visit. The documentation on 02/11/24 for the exam, diagnosis, procedures, and orders are all accurate and complete.  Kameelah Minish G. [place], MD  [facility] [place] office.

## 2024-02-11 NOTE — Telephone Encounter (Signed)
 Copied from CRM [id number]. Topic: Clinical - Prescription Issue >> Feb 11, 2024 10:28 AM Franky GRADE wrote: Reason for CRM: Patient was prescribed budesonide-formoterol (SYMBICORT) 160-4.5 MCG/ACT inhaler [[id number]]; however, it cost $212 out of pocket and the patient would like to know if there is an alternative or an assistance program that can help with the cost.

## 2024-02-11 NOTE — Patient Instructions (Addendum)
 A few things to remember from today's visit:  Persistent cough for 3 weeks or longer - Plan: DG Chest 2 View  Wheezing - Plan: budesonide-formoterol (SYMBICORT) 160-4.5 MCG/ACT inhaler, predniSONE  (DELTASONE ) 20 MG tablet  Plain mucinex. Prednisone  with breakfast for 3 days. Symbicort 2 times daily for 4 weeks. Monitor for new symptoms.  If you need refills for medications you take chronically, please call your pharmacy. Do not use My Chart to request refills or for acute issues that need immediate attention. If you send a my chart message, it may take a few days to be addressed, specially if I am not in the office.  Please be sure medication list is accurate. If a new problem present, please set up appointment sooner than planned today.

## 2024-02-14 ENCOUNTER — Encounter: Payer: Self-pay | Admitting: Family Medicine

## 2024-02-14 ENCOUNTER — Other Ambulatory Visit: Payer: Self-pay | Admitting: Family Medicine

## 2024-02-14 DIAGNOSIS — R052 Subacute cough: Secondary | ICD-10-CM

## 2024-02-14 MED ORDER — BENZONATATE 100 MG PO CAPS: 100.0000 mg | ORAL_CAPSULE | Freq: Two times a day (BID) | ORAL | 0 refills | Status: DC | PRN

## 2024-02-14 MED ORDER — AZITHROMYCIN 250 MG PO TABS
ORAL_TABLET | ORAL | 0 refills | Status: AC
Start: 2024-02-14 — End: 2024-02-18

## 2024-02-16 ENCOUNTER — Encounter: Payer: Self-pay | Admitting: Family Medicine

## 2024-02-16 ENCOUNTER — Ambulatory Visit: Payer: Self-pay | Admitting: Family Medicine

## 2024-02-18 ENCOUNTER — Other Ambulatory Visit: Payer: Self-pay | Admitting: Family Medicine

## 2024-02-18 DIAGNOSIS — R052 Subacute cough: Secondary | ICD-10-CM

## 2024-03-04 ENCOUNTER — Other Ambulatory Visit: Payer: Self-pay | Admitting: Family Medicine

## 2024-03-04 DIAGNOSIS — R062 Wheezing: Secondary | ICD-10-CM

## 2024-03-12 ENCOUNTER — Other Ambulatory Visit: Payer: Self-pay | Admitting: Family Medicine

## 2024-03-24 ENCOUNTER — Ambulatory Visit: Admitting: Physician Assistant

## 2024-03-24 ENCOUNTER — Encounter: Payer: Self-pay | Admitting: Physician Assistant

## 2024-03-24 DIAGNOSIS — R202 Paresthesia of skin: Secondary | ICD-10-CM

## 2024-03-24 DIAGNOSIS — R200 Anesthesia of skin: Secondary | ICD-10-CM

## 2024-03-24 MED ORDER — METHYLPREDNISOLONE 4 MG PO TBPK: ORAL_TABLET | ORAL | 0 refills | Status: AC
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
## 2024-03-29 ENCOUNTER — Other Ambulatory Visit (HOSPITAL_COMMUNITY): Payer: Self-pay

## 2024-04-12 ENCOUNTER — Encounter: Admitting: Physical Medicine and Rehabilitation

## 2024-04-20 ENCOUNTER — Encounter: Payer: Self-pay | Admitting: Family Medicine

## 2024-04-24 ENCOUNTER — Other Ambulatory Visit: Payer: Self-pay | Admitting: Family Medicine

## 2024-04-24 DIAGNOSIS — E042 Nontoxic multinodular goiter: Secondary | ICD-10-CM

## 2024-04-24 MED ORDER — LEVOTHYROXINE SODIUM 25 MCG PO TABS
25.0000 ug | ORAL_TABLET | Freq: Every day | ORAL | 1 refills | Status: AC
Start: 2024-04-24 — End: ?

## 2024-04-27 ENCOUNTER — Ambulatory Visit: Admitting: Physical Medicine and Rehabilitation

## 2024-04-27 DIAGNOSIS — M79644 Pain in right finger(s): Secondary | ICD-10-CM

## 2024-04-27 DIAGNOSIS — R29898 Other symptoms and signs involving the musculoskeletal system: Secondary | ICD-10-CM | POA: Diagnosis not present

## 2024-04-27 DIAGNOSIS — M79641 Pain in right hand: Secondary | ICD-10-CM

## 2024-04-27 DIAGNOSIS — G8929 Other chronic pain: Secondary | ICD-10-CM

## 2024-04-27 DIAGNOSIS — R200 Anesthesia of skin: Secondary | ICD-10-CM | POA: Diagnosis not present

## 2024-04-27 DIAGNOSIS — R202 Paresthesia of skin: Secondary | ICD-10-CM | POA: Diagnosis not present
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
## 2024-04-27 NOTE — Progress Notes (Signed)
 Pain Scale   Average Pain 3 Patient advisig she has Numbness/Tingling and some pain in her right hand with numbness in her fingers and thumb.        +Driver, -BT, -Dye Allergies.

## 2024-05-04 NOTE — Procedures (Signed)
 EMG & NCV Findings: Evaluation of the right median motor nerve showed prolonged distal onset latency (9.2 ms), reduced amplitude (4.3 mV), and decreased conduction velocity (Elbow-Wrist, 46 m/s).  The right median (across palm) sensory nerve showed prolonged distal peak latency (Wrist, 7.7 ms), reduced amplitude (6.8 V), and prolonged distal peak latency (Palm, 6.4 ms).  The right ulnar sensory nerve showed prolonged distal peak latency (4.0 ms) and decreased conduction velocity (Wrist-5th Digit, 35 m/s).  All remaining nerves (as indicated in the following tables) were within normal limits.    All examined muscles (as indicated in the following table) showed no evidence of electrical instability.    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   a very mild right ulnar nerve entrapment at the wrist vs cubital tunnel affecting sensory components.   There is no significant electrodiagnostic evidence of any other focal nerve entrapment, brachial plexopathy or cervical radiculopathy.   Recommendations: 1.  Follow-up with referring physician. 2.  Continue current management of symptoms. 3.  Continue use of resting splint at night-time and as needed during the day. 4.  Suggest surgical evaluation.  ___________________________ Prentice Masters FAAPMR Board Certified, American Board of Physical Medicine and Rehabilitation    Nerve Conduction Studies Anti Sensory Summary Table   Stim Site NR Peak (ms) Norm Peak (ms) P-T Amp (V) Norm P-T Amp Site1 Site2 Delta-P (ms) Dist (cm) Vel (m/s) Norm Vel (m/s)  Right Median Acr Palm Anti Sensory (2nd Digit)  30.3C  Wrist    *7.7 <3.6 *6.8 >10 Wrist Palm 1.3 0.0    Palm    *6.4 <2.0 3.1         Right Radial Anti Sensory (Base 1st Digit)  30.3C  Wrist    2.2 <3.1 25.8  Wrist Base 1st Digit 2.2 0.0     Right Ulnar Anti Sensory (5th Digit)  30.5C  Wrist    *4.0 <3.7 18.4 >15.0 Wrist 5th Digit 4.0 14.0 *35 >38   Motor Summary Table   Stim Site NR Onset (ms) Norm Onset (ms) O-P Amp (mV) Norm O-P Amp Site1 Site2 Delta-0 (ms) Dist (cm) Vel (m/s) Norm Vel (m/s)  Right Median Motor (Abd Poll Brev)  30.4C  Wrist    *9.2 <4.2 *4.3 >5 Elbow Wrist 4.5 20.5 *46 >50  Elbow    13.7  4.1         Right Ulnar Motor (Abd Dig Min)  30.5C  Wrist    4.1 <4.2 5.7 >3 B Elbow Wrist 3.9 20.5 53 >53  B Elbow    8.0  5.7  A Elbow B Elbow 1.4 10.0 71 >53  A Elbow    9.4  5.4          EMG   Side Muscle Nerve Root Ins Act Fibs Psw Amp Dur Poly Recrt Int Bruna Comment  Right Abd Poll Brev Median C8-T1 Nml Nml Nml Nml Nml 0 Nml Nml   Right 1stDorInt Ulnar C8-T1 Nml Nml Nml Nml Nml 0 Nml Nml   Right PronatorTeres Median C6-7 Nml Nml Nml Nml Nml 0 Nml Nml   Right Biceps Musculocut C5-6 Nml Nml Nml Nml Nml 0 Nml Nml   Right Deltoid Axillary C5-6 Nml Nml Nml Nml Nml 0 Nml Nml     Nerve Conduction Studies Anti Sensory Left/Right Comparison   Stim Site L Lat (ms) R Lat (  ms) L-R Lat (ms) L Amp (V) R Amp (V) L-R Amp (%) Site1 Site2 L Vel (m/s) R Vel (m/s) L-R Vel (m/s)  Median Acr Palm Anti Sensory (2nd Digit)  30.3C  Wrist  *7.7   *6.8  Wrist Palm     Palm  *6.4   3.1        Radial Anti Sensory (Base 1st Digit)  30.3C  Wrist  2.2   25.8  Wrist Base 1st Digit     Ulnar Anti Sensory (5th Digit)  30.5C  Wrist  *4.0   18.4  Wrist 5th Digit  *35    Motor Left/Right Comparison   Stim Site L Lat (ms) R Lat (ms) L-R Lat (ms) L Amp (mV) R Amp (mV) L-R Amp (%) Site1 Site2 L Vel (m/s) R Vel (m/s) L-R Vel (m/s)  Median Motor (Abd Poll Brev)  30.4C  Wrist  *9.2   *4.3  Elbow Wrist  *46   Elbow  13.7   4.1        Ulnar Motor (Abd Dig Min)  30.5C  Wrist  4.1   5.7  B Elbow Wrist  53   B Elbow  8.0   5.7  A Elbow B Elbow  71   A Elbow  9.4   5.4           Waveforms:

## 2024-05-08 ENCOUNTER — Encounter: Payer: Self-pay | Admitting: Physical Medicine and Rehabilitation

## 2024-05-11 ENCOUNTER — Ambulatory Visit (INDEPENDENT_AMBULATORY_CARE_PROVIDER_SITE_OTHER): Admitting: Orthopaedic Surgery

## 2024-05-11 DIAGNOSIS — G5601 Carpal tunnel syndrome, right upper limb: Secondary | ICD-10-CM | POA: Diagnosis not present
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
## 2024-05-12 DIAGNOSIS — Z1211 Encounter for screening for malignant neoplasm of colon: Secondary | ICD-10-CM | POA: Diagnosis not present

## 2024-05-12 LAB — HM COLONOSCOPY

## 2024-05-22 ENCOUNTER — Encounter: Payer: Self-pay | Admitting: Radiology

## 2024-05-22 ENCOUNTER — Other Ambulatory Visit: Payer: Self-pay | Admitting: Physician Assistant

## 2024-05-22 MED ORDER — HYDROCODONE-ACETAMINOPHEN 5-325 MG PO TABS: 1.0000 | ORAL_TABLET | Freq: Three times a day (TID) | ORAL | 0 refills | Status: AC | PRN

## 2024-05-25 DIAGNOSIS — G5601 Carpal tunnel syndrome, right upper limb: Secondary | ICD-10-CM | POA: Diagnosis not present

## 2024-05-26 ENCOUNTER — Telehealth: Payer: Self-pay

## 2024-05-26 NOTE — Telephone Encounter (Signed)
 Called patient to advise

## 2024-05-26 NOTE — Telephone Encounter (Signed)
 How about 800 mg ibuprofen every 8 hours.  Take with food and an antacid

## 2024-05-26 NOTE — Telephone Encounter (Signed)
 Patient called and states she is having so much pain. She is taking 1 Hydrocodone  ever 4 hours and is not helping. +Ice & + Elevates hand.   DOS: 05/25/2024- S/P R CTR   CB [phone]

## 2024-06-02 ENCOUNTER — Ambulatory Visit: Admitting: Physician Assistant

## 2024-06-02 DIAGNOSIS — Z9889 Other specified postprocedural states: Secondary | ICD-10-CM

## 2024-06-02 DIAGNOSIS — G5601 Carpal tunnel syndrome, right upper limb: Secondary | ICD-10-CM
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
## 2024-06-08 ENCOUNTER — Ambulatory Visit: Admitting: Physician Assistant

## 2024-06-08 DIAGNOSIS — Z9889 Other specified postprocedural states: Secondary | ICD-10-CM

## 2024-06-08 MED ORDER — TRAMADOL HCL 50 MG PO TABS: 50.0000 mg | ORAL_TABLET | Freq: Two times a day (BID) | ORAL | 0 refills | Status: AC | PRN
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
## 2024-06-22 ENCOUNTER — Ambulatory Visit: Admitting: Physician Assistant

## 2024-06-22 DIAGNOSIS — Z9889 Other specified postprocedural states: Secondary | ICD-10-CM

## 2024-06-22 DIAGNOSIS — G5601 Carpal tunnel syndrome, right upper limb: Secondary | ICD-10-CM
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
## 2024-08-04 ENCOUNTER — Other Ambulatory Visit: Payer: Self-pay | Admitting: Family Medicine

## 2024-08-04 DIAGNOSIS — I10 Essential (primary) hypertension: Secondary | ICD-10-CM
